# Patient Record
Sex: Female | Born: 1940
Health system: Southern US, Community
[De-identification: ages and names within clinical notes are randomized; demographics above are authoritative.]

## PROBLEM LIST (undated history)

## (undated) DIAGNOSIS — M1990 Unspecified osteoarthritis, unspecified site: Secondary | ICD-10-CM

## (undated) DIAGNOSIS — I8500 Esophageal varices without bleeding: Secondary | ICD-10-CM

## (undated) DIAGNOSIS — K760 Fatty (change of) liver, not elsewhere classified: Secondary | ICD-10-CM

## (undated) DIAGNOSIS — K2970 Gastritis, unspecified, without bleeding: Secondary | ICD-10-CM

## (undated) DIAGNOSIS — K219 Gastro-esophageal reflux disease without esophagitis: Secondary | ICD-10-CM

## (undated) DIAGNOSIS — Z87442 Personal history of urinary calculi: Secondary | ICD-10-CM

## (undated) DIAGNOSIS — I509 Heart failure, unspecified: Secondary | ICD-10-CM

## (undated) DIAGNOSIS — IMO0001 Reserved for inherently not codable concepts without codable children: Secondary | ICD-10-CM

## (undated) DIAGNOSIS — J45909 Unspecified asthma, uncomplicated: Secondary | ICD-10-CM

## (undated) DIAGNOSIS — I10 Essential (primary) hypertension: Secondary | ICD-10-CM

## (undated) DIAGNOSIS — F32A Depression, unspecified: Secondary | ICD-10-CM

## (undated) DIAGNOSIS — R42 Dizziness and giddiness: Secondary | ICD-10-CM

## (undated) DIAGNOSIS — E559 Vitamin D deficiency, unspecified: Secondary | ICD-10-CM

## (undated) DIAGNOSIS — Z8659 Personal history of other mental and behavioral disorders: Secondary | ICD-10-CM

## (undated) DIAGNOSIS — M4800 Spinal stenosis, site unspecified: Secondary | ICD-10-CM

## (undated) DIAGNOSIS — M5410 Radiculopathy, site unspecified: Secondary | ICD-10-CM

## (undated) DIAGNOSIS — J40 Bronchitis, not specified as acute or chronic: Secondary | ICD-10-CM

## (undated) DIAGNOSIS — F329 Major depressive disorder, single episode, unspecified: Secondary | ICD-10-CM

## (undated) DIAGNOSIS — M8580 Other specified disorders of bone density and structure, unspecified site: Secondary | ICD-10-CM

## (undated) DIAGNOSIS — K7460 Unspecified cirrhosis of liver: Secondary | ICD-10-CM

## (undated) DIAGNOSIS — D649 Anemia, unspecified: Secondary | ICD-10-CM

## (undated) DIAGNOSIS — F419 Anxiety disorder, unspecified: Secondary | ICD-10-CM

## (undated) HISTORY — PX: ABDOMINAL HYSTERECTOMY: SHX81

## (undated) HISTORY — DX: Personal history of other mental and behavioral disorders: Z86.59

## (undated) HISTORY — DX: Reserved for inherently not codable concepts without codable children: IMO0001

## (undated) HISTORY — DX: Gastro-esophageal reflux disease without esophagitis: K21.9

## (undated) HISTORY — DX: Radiculopathy, site unspecified: M54.10

## (undated) HISTORY — DX: Fatty (change of) liver, not elsewhere classified: K76.0

## (undated) HISTORY — DX: Other specified disorders of bone density and structure, unspecified site: M85.80

## (undated) HISTORY — DX: Vitamin D deficiency, unspecified: E55.9

## (undated) HISTORY — PX: APPENDECTOMY: SHX54

## (undated) HISTORY — DX: Gastritis, unspecified, without bleeding: K29.70

## (undated) HISTORY — PX: LAPAROSCOPIC CHOLECYSTECTOMY: SUR755

## (undated) HISTORY — PX: COMBINED HYSTERECTOMY ABDOMINAL W/ A&P REPAIR / OOPHORECTOMY: SUR292

## (undated) HISTORY — PX: BREAST BIOPSY: SHX20

## (undated) HISTORY — PX: TONSILLECTOMY: SUR1361

---
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
## 2006-04-05 ENCOUNTER — Ambulatory Visit: Payer: Self-pay | Admitting: Family Medicine

## 2006-04-21 ENCOUNTER — Ambulatory Visit: Payer: Self-pay | Admitting: Family Medicine

## 2007-12-20 ENCOUNTER — Ambulatory Visit: Payer: Self-pay | Admitting: Internal Medicine

## 2011-10-01 ENCOUNTER — Ambulatory Visit: Payer: Self-pay | Admitting: Internal Medicine

## 2011-12-11 DIAGNOSIS — M7060 Trochanteric bursitis, unspecified hip: Secondary | ICD-10-CM | POA: Insufficient documentation

## 2011-12-11 DIAGNOSIS — M5410 Radiculopathy, site unspecified: Secondary | ICD-10-CM | POA: Insufficient documentation

## 2011-12-11 HISTORY — DX: Radiculopathy, site unspecified: M54.10

## 2013-02-21 ENCOUNTER — Ambulatory Visit: Payer: Self-pay | Admitting: Internal Medicine

## 2013-10-05 ENCOUNTER — Encounter (INDEPENDENT_AMBULATORY_CARE_PROVIDER_SITE_OTHER): Payer: Self-pay

## 2013-10-05 ENCOUNTER — Ambulatory Visit (INDEPENDENT_AMBULATORY_CARE_PROVIDER_SITE_OTHER): Payer: Medicare PPO | Admitting: Cardiovascular Disease

## 2013-10-05 ENCOUNTER — Encounter: Payer: Self-pay | Admitting: Cardiovascular Disease

## 2013-10-05 VITALS — BP 138/80 | HR 89 | Ht 63.0 in | Wt 203.2 lb

## 2013-10-05 DIAGNOSIS — R0602 Shortness of breath: Secondary | ICD-10-CM | POA: Insufficient documentation

## 2013-10-05 DIAGNOSIS — F4322 Adjustment disorder with anxiety: Secondary | ICD-10-CM

## 2013-10-05 DIAGNOSIS — E669 Obesity, unspecified: Secondary | ICD-10-CM

## 2013-10-05 DIAGNOSIS — F419 Anxiety disorder, unspecified: Secondary | ICD-10-CM

## 2013-10-05 DIAGNOSIS — F411 Generalized anxiety disorder: Secondary | ICD-10-CM

## 2013-10-05 DIAGNOSIS — R5381 Other malaise: Secondary | ICD-10-CM

## 2013-10-05 DIAGNOSIS — I5032 Chronic diastolic (congestive) heart failure: Secondary | ICD-10-CM | POA: Insufficient documentation

## 2013-10-05 DIAGNOSIS — I509 Heart failure, unspecified: Secondary | ICD-10-CM

## 2013-10-05 HISTORY — DX: Adjustment disorder with anxiety: F43.22

## 2013-10-05 MED ORDER — FUROSEMIDE 20 MG PO TABS: 20.0000 mg | ORAL_TABLET | Freq: Every day | ORAL | Status: DC | PRN

## 2013-10-05 NOTE — Patient Instructions (Signed)
You are doing well.  For significant shortness of breath, Take a lasix /furosemide in the morning with banana  If the shortness of breath gets worse, call the office   Please call us if you have new issues that need to be addressed before your next appt.  Your physician wants you to follow-up in: 6 months. January You will receive a reminder letter in the mail two months in advance. If you don't receive a letter, please call our office to schedule the follow-up appointment.

## 2013-10-05 NOTE — Assessment & Plan Note (Signed)
We have encouraged continued exercise, careful diet management in an effort to lose weight. 

## 2013-10-05 NOTE — Assessment & Plan Note (Signed)
I suspect her shortness of breath is likely multifactorial including deconditioning, obesity, possible mild chronic diastolic CHF. Recommended she start a regular walking program, she will take Lasix only for significant symptoms. Less likely underlying ischemia as she does not have any risk factors, EKG benign, and overall she is relatively well functioning. Recommended she call us the symptoms get worse and additional testing could be performed

## 2013-10-05 NOTE — Assessment & Plan Note (Signed)
She has lost her husband, lost her son as well as several friends. She is doing well considering this, currently lives alone

## 2013-10-05 NOTE — Progress Notes (Signed)
Patient ID: Jenna Franklin, female    DOB: March 17, 1940, 73 y.o.   MRN: [id number]  HPI Comments: [name] is a very pleasant 73 year old woman, patient of Dr. Hall Busing, with history of obesity, anxiety, vertigo, bursitis of her right hip, obesity who presents for evaluation of shortness of breath.  She states that she is worried about cardiac issues as she has a strong family history. She denies any smoking history, the disease She was exercising on a regular basis but stopped last fall 2014 after she developed bronchitis. Since then she has been relatively sedentary and weight has been climbing. She denies any significant chest pain with exertion. She is able to do all of her ADLs. She lives independently Husband died in his 2s after multiple lung cancer surgeries. Son died at 59 from asthma exacerbation.  She reports that her sister recently had  heart valve surgery and she is nervous that she might need the same . Rare episodes of vertigo  She's able to walk around [employer] but after walking to the parking lot, does have to slow down for shortness of breath. She does report having some very mild swelling in her lower extremities. Mother also had this and was on a diuretic  EKG shows normal sinus rhythm with rate 89 beats per minute, rare PVC  Most recent lipid panel at the end of 2014 showing total cholesterol 143, LDL 72, HDL 50   Outpatient Encounter Prescriptions as of 10/05/2013  Medication Sig  . amitriptyline (ELAVIL) 25 MG tablet Take 25 mg by mouth at bedtime.  Marland Kitchen atenolol (TENORMIN) 25 MG tablet Take 25 mg by mouth 2 (two) times daily.   . Calcium Carbonate-Vitamin D (CALCIUM + D PO) Take by mouth daily.  . Estradiol Acetate (FEMRING) 0.05 MG/24HR RING Place vaginally.  . fluticasone (VERAMYST) 27.5 MCG/SPRAY nasal spray Place 2 sprays into the nose daily.  . vitamin E 400 UNIT capsule Take 400 Units by mouth daily.    Review of Systems  Constitutional: Negative.   HENT:  Negative.   Eyes: Negative.   Respiratory: Positive for shortness of breath.   Cardiovascular: Negative.   Gastrointestinal: Negative.   Endocrine: Negative.   Musculoskeletal: Negative.   Skin: Negative.   Allergic/Immunologic: Negative.   Neurological: Negative.   Hematological: Negative.   Psychiatric/Behavioral: Negative.   All other systems reviewed and are negative.   BP 138/80  Pulse 89  Ht 5\' 3"  (1.6 m)  Wt 203 lb 4 oz (92.194 kg)  BMI 36.01 kg/m2  Physical Exam  Nursing note and vitals reviewed. Constitutional: She is oriented to person, place, and time. She appears well-developed and well-nourished.  Obesity  HENT:  Head: Normocephalic.  Nose: Nose normal.  Mouth/Throat: Oropharynx is clear and moist.  Eyes: Conjunctivae are normal. Pupils are equal, round, and reactive to light.  Neck: Normal range of motion. Neck supple. No JVD present.  Cardiovascular: Normal rate, regular rhythm, S1 normal, S2 normal, normal heart sounds and intact distal pulses.  Exam reveals no gallop and no friction rub.   No murmur heard. Pulmonary/Chest: Effort normal and breath sounds normal. No respiratory distress. She has no wheezes. She has no rales. She exhibits no tenderness.  Abdominal: Soft. Bowel sounds are normal. She exhibits no distension. There is no tenderness.  Musculoskeletal: Normal range of motion. She exhibits no edema and no tenderness.  Lymphadenopathy:    She has no cervical adenopathy.  Neurological: She is alert and oriented to person,  place, and time. Coordination normal.  Skin: Skin is warm and dry. No rash noted. No erythema.  Psychiatric: She has a normal mood and affect. Her behavior is normal. Judgment and thought content normal.    Assessment and Plan

## 2013-10-05 NOTE — Assessment & Plan Note (Signed)
Mild lower extremity edema, not particularly impressive but given her shortness of breath and previous echocardiogram several years ago with diastolic dysfunction does raise the concern for diastolic CHF, mild. We have suggested she try Lasix only for significant symptoms of shortness of breath, bloating, worsening leg swelling. She will take this with a banana

## 2013-10-05 NOTE — Assessment & Plan Note (Signed)
Relatively sedentary with recent weight gain. Recommended she increase her regular exercise

## 2014-02-16 DIAGNOSIS — K7460 Unspecified cirrhosis of liver: Secondary | ICD-10-CM

## 2014-02-16 HISTORY — DX: Unspecified cirrhosis of liver: K74.60

## 2014-04-11 DIAGNOSIS — N959 Unspecified menopausal and perimenopausal disorder: Secondary | ICD-10-CM | POA: Insufficient documentation

## 2014-04-17 ENCOUNTER — Ambulatory Visit: Payer: Self-pay | Admitting: Internal Medicine

## 2014-09-20 DIAGNOSIS — R7303 Prediabetes: Secondary | ICD-10-CM | POA: Insufficient documentation

## 2015-02-21 DIAGNOSIS — R609 Edema, unspecified: Secondary | ICD-10-CM | POA: Diagnosis not present

## 2015-02-28 DIAGNOSIS — R600 Localized edema: Secondary | ICD-10-CM | POA: Diagnosis not present

## 2015-02-28 DIAGNOSIS — R69 Illness, unspecified: Secondary | ICD-10-CM | POA: Diagnosis not present

## 2015-02-28 DIAGNOSIS — D509 Iron deficiency anemia, unspecified: Secondary | ICD-10-CM | POA: Diagnosis not present

## 2015-03-11 DIAGNOSIS — D649 Anemia, unspecified: Secondary | ICD-10-CM | POA: Diagnosis not present

## 2015-03-21 DIAGNOSIS — R188 Other ascites: Secondary | ICD-10-CM | POA: Diagnosis not present

## 2015-03-21 DIAGNOSIS — K7689 Other specified diseases of liver: Secondary | ICD-10-CM | POA: Diagnosis not present

## 2015-03-21 DIAGNOSIS — R162 Hepatomegaly with splenomegaly, not elsewhere classified: Secondary | ICD-10-CM | POA: Diagnosis not present

## 2015-03-21 DIAGNOSIS — Z9049 Acquired absence of other specified parts of digestive tract: Secondary | ICD-10-CM | POA: Diagnosis not present

## 2015-03-25 ENCOUNTER — Other Ambulatory Visit: Payer: Self-pay

## 2015-03-25 ENCOUNTER — Ambulatory Visit (INDEPENDENT_AMBULATORY_CARE_PROVIDER_SITE_OTHER): Payer: Medicare HMO | Admitting: Gastroenterology

## 2015-03-25 ENCOUNTER — Encounter: Payer: Self-pay | Admitting: Gastroenterology

## 2015-03-25 VITALS — BP 133/67 | HR 95 | Temp 99.0°F | Ht 63.0 in | Wt 226.0 lb

## 2015-03-25 DIAGNOSIS — R188 Other ascites: Secondary | ICD-10-CM | POA: Diagnosis not present

## 2015-03-25 DIAGNOSIS — F419 Anxiety disorder, unspecified: Secondary | ICD-10-CM | POA: Insufficient documentation

## 2015-03-25 DIAGNOSIS — K7460 Unspecified cirrhosis of liver: Secondary | ICD-10-CM

## 2015-03-25 DIAGNOSIS — N200 Calculus of kidney: Secondary | ICD-10-CM

## 2015-03-25 DIAGNOSIS — R000 Tachycardia, unspecified: Secondary | ICD-10-CM | POA: Insufficient documentation

## 2015-03-25 DIAGNOSIS — K219 Gastro-esophageal reflux disease without esophagitis: Secondary | ICD-10-CM | POA: Insufficient documentation

## 2015-03-25 HISTORY — DX: Calculus of kidney: N20.0

## 2015-03-25 HISTORY — DX: Tachycardia, unspecified: R00.0

## 2015-03-25 MED ORDER — SPIRONOLACTONE 100 MG PO TABS: 100.0000 mg | ORAL_TABLET | Freq: Every day | ORAL | Status: DC

## 2015-03-25 NOTE — Progress Notes (Signed)
Gastroenterology Consultation  Referring Provider:     Albina Billet, MD Primary Care Physician:  Albina Billet, MD Primary Gastroenterologist:  Dr. Allen Norris     Reason for Consultation:     Ascites        HPI:   Jenna Franklin is a 75 y.o. y/o female referred for consultation & management of ascites by Dr. Albina Billet, MD.  This patient comes in today with a report of ascites that has been worse over the last 9 days. The patient states that she was seen by Dr. Tamala Julian at [facility] approximately 8 years ago and was found to have signs consistent with cirrhosis. The patient was told to lose 25 pounds but never went ahead and did it. The patient then reports that she had her gallbladder out about 6 years ago and at that time she was told that there were areas of her liver that looks cirrhotic. The patient had no further follow-up of that. The patient now presents with this progressive abdominal distention and discomfort. The patient has no other complaints such as shortness of breath. The patient denies any alcohol abuse in the past. The patient was diagnosed with nonalcoholic fatty liver disease in the past. The patient also has been found to have anemia and is being treated with iron at the present time. The patient states that her anemia has improved on iron. The patient has not had a colonoscopy in the past. She denies any history of GI problems  Past Medical History  Diagnosis Date  . Osteopenia   . Reflux   . Non-alcoholic fatty liver disease   . Vitamin D deficiency   . H/O: depression   . Osteopenia   . Reflux   . Nonalcoholic fatty liver disease   . Vitamin D deficiency     Past Surgical History  Procedure Laterality Date  . Combined hysterectomy abdominal w/ a&p repair / oophorectomy    . Appendectomy    . Tonsillectomy    . Laparoscopic cholecystectomy    . Breast biopsy      Prior to Admission medications   Medication Sig Start Date End Date Taking?  Authorizing Provider  amitriptyline (ELAVIL) 25 MG tablet Take 25 mg by mouth at bedtime.   Yes Historical Provider, MD  Calcium Carbonate-Vitamin D (CALCIUM + D PO) Take by mouth daily.   Yes Historical Provider, MD  estradiol (ESTRACE) 1 MG tablet Take by mouth. 07/10/14  Yes Historical Provider, MD  ferrous sulfate 325 (65 FE) MG tablet Take 325 mg by mouth daily with breakfast.   Yes Historical Provider, MD  furosemide (LASIX) 20 MG tablet Take 1 tablet (20 mg total) by mouth daily as needed. Patient taking differently: Take 40 mg by mouth daily as needed.  10/05/13  Yes Minna Merritts, MD  meloxicam (MOBIC) 15 MG tablet Take by mouth. 10/09/14  Yes Historical Provider, MD  omeprazole (PRILOSEC) 20 MG capsule Take by mouth.   Yes Historical Provider, MD  vitamin E 400 UNIT capsule Take 400 Units by mouth daily.   Yes Historical Provider, MD  atenolol (TENORMIN) 25 MG tablet Take 25 mg by mouth 2 (two) times daily. Reported on 03/25/2015    Historical Provider, MD  Estradiol Acetate ([place]) 0.05 MG/24HR RING Place vaginally. Reported on 03/25/2015    Historical Provider, MD  fluticasone (VERAMYST) 27.5 MCG/SPRAY nasal spray Place 2 sprays into the nose daily. Reported on 03/25/2015    Historical Provider, MD  spironolactone (ALDACTONE) 100 MG tablet Take 1 tablet (100 mg total) by mouth daily. 03/25/15   Lucilla Lame, MD    Family History  Problem Relation Age of Onset  . Heart disease Mother   . Heart disease Father   . Heart attack Paternal Grandfather   . Asthma Son 40     Social History  Substance Use Topics  . Smoking status: Never Smoker   . Smokeless tobacco: Never Used  . Alcohol Use: No    Allergies as of 03/25/2015 - Review Complete 03/25/2015  Allergen Reaction Noted  . Codeine  09/28/2013    Review of Systems:    All systems reviewed and negative except where noted in HPI.   Physical Exam:  BP 133/67 mmHg  Pulse 95  Temp(Src) 99 F (37.2 C) (Oral)  Ht 5\' 3"  (1.6 m)   Wt 226 lb (102.513 kg)  BMI 40.04 kg/m2 No LMP recorded. Psych:  Alert and cooperative. Normal mood and affect. General:   Alert,  Well-developed, obese, well-nourished, pleasant and cooperative in NAD Head:  Normocephalic and atraumatic. Eyes:  Sclera clear, no icterus.   Conjunctiva pink. Ears:  Normal auditory acuity. Nose:  No deformity, discharge, or lesions. Mouth:  No deformity or lesions,oropharynx pink & moist. Neck:  Supple; no masses or thyromegaly. Lungs:  Respirations even and unlabored.  Clear throughout to auscultation.   No wheezes, crackles, or rhonchi. No acute distress. Heart:  Regular rate and rhythm; no murmurs, clicks, rubs, or gallops. Abdomen:  Normal bowel sounds.  No bruits.  Soft, non-tender and non-distended without masses, hepatosplenomegaly cannot be determined with the patient's ascites being present  No guarding or rebound tenderness.  Negative Carnett sign.   Rectal:  Deferred.  Msk:  Symmetrical without gross deformities.  Good, equal movement & strength bilaterally. Pulses:  Normal pulses noted. Extremities:  No clubbing.  No cyanosis. There was edema on both lower extremities. Neurologic:  Alert and oriented x3;  grossly normal neurologically. Skin:  Intact without significant lesions or rashes.  No jaundice. Lymph Nodes:  No significant cervical adenopathy. Psych:  Alert and cooperative. Normal mood and affect.  Imaging Studies: No results found.  Assessment and Plan:   Jenna Franklin is a 75 y.o. y/o female who comes in today with a history of nonalcoholic fatty liver disease with cirrhosis. The patient now is here with worsening ascites. The patient was told to increase her Lasix from 20 to 40 by her primary care provider. The patient will also be started on Aldactone. The patient will started 100 mg a day. She will also have her labs checked today for both renal function and potassium. She will come back in one week and have it checked again  and depending on this will determine whether we have room to increase her diuretics. The patient has been explained the pathology and pathophysiology of her cirrhosis. She'll also be set up for a Doppler ultrasound to make sure that portal vein thrombosis is not the reason for the patient's recent decompensation.   Note: This dictation was prepared with Dragon dictation along with smaller phrase technology. Any transcriptional errors that result from this process are unintentional.

## 2015-03-26 LAB — COMPREHENSIVE METABOLIC PANEL
A/G RATIO: 1 — AB (ref 1.1–2.5)
ALBUMIN: 3.4 g/dL — AB (ref 3.5–4.8)
ALT: 18 IU/L (ref 0–32)
AST: 36 IU/L (ref 0–40)
Alkaline Phosphatase: 81 IU/L (ref 39–117)
BILIRUBIN TOTAL: 1.3 mg/dL — AB (ref 0.0–1.2)
BUN / CREAT RATIO: 11 (ref 11–26)
BUN: 9 mg/dL (ref 8–27)
CHLORIDE: 98 mmol/L (ref 96–106)
CO2: 23 mmol/L (ref 18–29)
Calcium: 8.2 mg/dL — ABNORMAL LOW (ref 8.7–10.3)
Creatinine, Ser: 0.8 mg/dL (ref 0.57–1.00)
GFR calc non Af Amer: 73 mL/min/{1.73_m2} (ref >59)
GFR, EST AFRICAN AMERICAN: 84 mL/min/{1.73_m2} (ref >59)
Globulin, Total: 3.4 g/dL (ref 1.5–4.5)
Glucose: 115 mg/dL — ABNORMAL HIGH (ref 65–99)
POTASSIUM: 3.4 mmol/L — AB (ref 3.5–5.2)
Sodium: 138 mmol/L (ref 134–144)
TOTAL PROTEIN: 6.8 g/dL (ref 6.0–8.5)

## 2015-03-29 ENCOUNTER — Other Ambulatory Visit: Payer: Self-pay | Admitting: Gastroenterology

## 2015-03-29 ENCOUNTER — Other Ambulatory Visit: Payer: Self-pay

## 2015-03-29 DIAGNOSIS — R188 Other ascites: Principal | ICD-10-CM

## 2015-03-29 DIAGNOSIS — K7460 Unspecified cirrhosis of liver: Secondary | ICD-10-CM

## 2015-04-01 ENCOUNTER — Ambulatory Visit: Admission: RE | Admit: 2015-04-01 | Payer: Medicare PPO | Source: Ambulatory Visit

## 2015-04-01 ENCOUNTER — Ambulatory Visit
Admission: RE | Admit: 2015-04-01 | Discharge: 2015-04-01 | Disposition: A | Discharge status: Home / Self Care | Payer: Medicare HMO | Source: Ambulatory Visit | Attending: Gastroenterology | Admitting: Gastroenterology

## 2015-04-01 DIAGNOSIS — R161 Splenomegaly, not elsewhere classified: Secondary | ICD-10-CM | POA: Insufficient documentation

## 2015-04-01 DIAGNOSIS — R188 Other ascites: Secondary | ICD-10-CM | POA: Diagnosis not present

## 2015-04-01 DIAGNOSIS — K7689 Other specified diseases of liver: Secondary | ICD-10-CM | POA: Diagnosis not present

## 2015-04-01 DIAGNOSIS — K7460 Unspecified cirrhosis of liver: Secondary | ICD-10-CM | POA: Insufficient documentation

## 2015-04-01 DIAGNOSIS — R140 Abdominal distension (gaseous): Secondary | ICD-10-CM | POA: Insufficient documentation

## 2015-04-02 ENCOUNTER — Other Ambulatory Visit: Payer: Self-pay

## 2015-04-02 ENCOUNTER — Telehealth: Payer: Self-pay

## 2015-04-02 DIAGNOSIS — K7460 Unspecified cirrhosis of liver: Secondary | ICD-10-CM

## 2015-04-02 DIAGNOSIS — R188 Other ascites: Principal | ICD-10-CM

## 2015-04-02 NOTE — Telephone Encounter (Signed)
-----   Message from Lucilla Lame, MD sent at 04/01/2015  4:10 PM EST ----- The patient had a nodule in the left liver and will need an MRI with and without contrast. Please also send off an alpha-fetoprotein

## 2015-04-03 NOTE — Telephone Encounter (Signed)
Pt has been notified of imaging results. Pt has been scheduled for the MRI w/wo contrast at [facility] on 04/19/15 at 1:00pm. This was scheduled with her son Merry Proud. Instructions were given and understood. Pt will also come Wed or Thurs for labs.

## 2015-04-04 DIAGNOSIS — K7460 Unspecified cirrhosis of liver: Secondary | ICD-10-CM | POA: Diagnosis not present

## 2015-04-05 LAB — COMPREHENSIVE METABOLIC PANEL
A/G RATIO: 1 — AB (ref 1.1–2.5)
ALBUMIN: 3.4 g/dL — AB (ref 3.5–4.8)
ALK PHOS: 70 IU/L (ref 39–117)
ALT: 18 IU/L (ref 0–32)
AST: 37 IU/L (ref 0–40)
BILIRUBIN TOTAL: 1.5 mg/dL — AB (ref 0.0–1.2)
BUN / CREAT RATIO: 11 (ref 11–26)
BUN: 10 mg/dL (ref 8–27)
CHLORIDE: 96 mmol/L (ref 96–106)
CO2: 21 mmol/L (ref 18–29)
Calcium: 9 mg/dL (ref 8.7–10.3)
Creatinine, Ser: 0.94 mg/dL (ref 0.57–1.00)
GFR calc non Af Amer: 60 mL/min/{1.73_m2} (ref >59)
GFR, EST AFRICAN AMERICAN: 69 mL/min/{1.73_m2} (ref >59)
GLUCOSE: 159 mg/dL — AB (ref 65–99)
Globulin, Total: 3.3 g/dL (ref 1.5–4.5)
POTASSIUM: 4.4 mmol/L (ref 3.5–5.2)
Sodium: 133 mmol/L — ABNORMAL LOW (ref 134–144)
TOTAL PROTEIN: 6.7 g/dL (ref 6.0–8.5)

## 2015-04-05 LAB — AFP TUMOR MARKER: AFP TUMOR MARKER: 6.1 ng/mL (ref 0.0–8.3)

## 2015-04-08 ENCOUNTER — Other Ambulatory Visit: Payer: Self-pay

## 2015-04-08 ENCOUNTER — Telehealth: Payer: Self-pay

## 2015-04-08 DIAGNOSIS — R188 Other ascites: Principal | ICD-10-CM

## 2015-04-08 DIAGNOSIS — K7460 Unspecified cirrhosis of liver: Secondary | ICD-10-CM

## 2015-04-08 MED ORDER — SPIRONOLACTONE 50 MG PO TABS: 50.0000 mg | ORAL_TABLET | Freq: Every day | ORAL | Status: DC

## 2015-04-08 NOTE — Telephone Encounter (Signed)
Pt has been advised her recent lab results. Per Dr. Allen Norris, pt is to continue Lasix 40mg  and increase her Spironolactone to 150mg  daily. Pt verbalized understanding and will call me when she needs a refill.

## 2015-04-09 ENCOUNTER — Telehealth: Payer: Self-pay

## 2015-04-09 NOTE — Telephone Encounter (Signed)
-----   Message from Lucilla Lame, MD sent at 04/09/2015  1:16 PM EST ----- Will increase this prior lactone to help with her ascites

## 2015-04-09 NOTE — Telephone Encounter (Signed)
Pt notified of results and to increase Spironolactone to 150mg .

## 2015-04-19 ENCOUNTER — Ambulatory Visit
Admission: RE | Admit: 2015-04-19 | Discharge: 2015-04-19 | Disposition: A | Discharge status: Home / Self Care | Payer: Medicare HMO | Source: Ambulatory Visit | Attending: Gastroenterology | Admitting: Gastroenterology

## 2015-04-19 DIAGNOSIS — K769 Liver disease, unspecified: Secondary | ICD-10-CM | POA: Insufficient documentation

## 2015-04-19 DIAGNOSIS — R188 Other ascites: Secondary | ICD-10-CM | POA: Insufficient documentation

## 2015-04-19 DIAGNOSIS — K7460 Unspecified cirrhosis of liver: Secondary | ICD-10-CM | POA: Diagnosis not present

## 2015-04-19 DIAGNOSIS — R161 Splenomegaly, not elsewhere classified: Secondary | ICD-10-CM | POA: Insufficient documentation

## 2015-04-19 MED ORDER — GADOBENATE DIMEGLUMINE 529 MG/ML IV SOLN
20.0000 mL | Freq: Once | INTRAVENOUS | Status: AC | PRN
  Administered 2015-04-19: 20 mL via INTRAVENOUS

## 2015-04-24 ENCOUNTER — Telehealth: Payer: Self-pay

## 2015-04-24 NOTE — Telephone Encounter (Signed)
When will we need to repeat her LFT's and AFP?

## 2015-04-24 NOTE — Telephone Encounter (Signed)
-----   Message from Lucilla Lame, MD sent at 04/23/2015  2:10 PM EST ----- Let the patient know that her imaging showed her to have small to moderate amount of ascites. It also was consistent with her cirrhosis.

## 2015-04-24 NOTE — Telephone Encounter (Signed)
Pt notified of results

## 2015-04-26 NOTE — Telephone Encounter (Signed)
1 month

## 2015-04-29 NOTE — Telephone Encounter (Signed)
Pt notified when to repeat labs.

## 2015-05-07 ENCOUNTER — Telehealth: Payer: Self-pay | Admitting: Gastroenterology

## 2015-05-07 NOTE — Telephone Encounter (Signed)
Patient is shaking and can't write, can't do much of anything. Was given Lasix 2 tablet daily and spironolactone, it was upped from 1 tablet to 1 1/2, 150 mg daily. She doesn't know if this medication is doing this. Her friend is taking her to the grocery store so she would like for you to call in first thing in the morning.

## 2015-05-07 NOTE — Telephone Encounter (Signed)
Advised pt per Dr. Allen Norris to stop her Lasix and Spironolactone for a few days. If shaking continues, then she should contact her PCP as it may not be GI related and to then restart these 2 medications. Pt verbalized understanding and will contact me in a few days to let me know if shaking stops or continues.

## 2015-05-27 ENCOUNTER — Telehealth: Payer: Self-pay | Admitting: Gastroenterology

## 2015-05-27 DIAGNOSIS — M7989 Other specified soft tissue disorders: Secondary | ICD-10-CM

## 2015-05-27 NOTE — Telephone Encounter (Signed)
Patient has appointment Thursday. Should she start back on the medication, Spironolactone? The shacking did stop but the fluid is beginning to build back up. She's been off of it for 2 weeks and has 4 pills left. She needs some relief. Dr. Dorothey Baseman patient.

## 2015-05-27 NOTE — Telephone Encounter (Signed)
Returned phone call to patient. She states that she has not been shaking since discontinuing the Spironolactone. However, the fluid is building back up per patient and she is having some SOB on exertion. Spoke with Dr. Allen Norris regarding this. He would like for patient to increase Lasix 20 additional mg daily. Patient states that she is currently taking 40 mg in am. I asked her to take an additional 20 mg in the evening to help remove extra fluid. He also would like for patient to get a Creatinine in 1 week. Lab has been ordered and patient will pick up at upcoming appointment. She verbalizes understanding and will come to her appointment on Thursday as scheduled.   Patient is encouraged to go straight to the Emergency Room if she begins having difficulty breathing or chest pain.

## 2015-05-29 ENCOUNTER — Encounter: Payer: Self-pay | Admitting: Internal Medicine

## 2015-05-29 ENCOUNTER — Ambulatory Visit (INDEPENDENT_AMBULATORY_CARE_PROVIDER_SITE_OTHER): Payer: Medicare HMO | Admitting: Internal Medicine

## 2015-05-29 ENCOUNTER — Ambulatory Visit
Admission: RE | Admit: 2015-05-29 | Discharge: 2015-05-29 | Disposition: A | Discharge status: Home / Self Care | Payer: Medicare HMO | Source: Ambulatory Visit | Attending: Internal Medicine | Admitting: Internal Medicine

## 2015-05-29 VITALS — BP 126/68 | HR 88 | Temp 98.1°F | Resp 20 | Wt 225.0 lb

## 2015-05-29 DIAGNOSIS — R0602 Shortness of breath: Secondary | ICD-10-CM | POA: Diagnosis not present

## 2015-05-29 DIAGNOSIS — Z1239 Encounter for other screening for malignant neoplasm of breast: Secondary | ICD-10-CM

## 2015-05-29 DIAGNOSIS — J40 Bronchitis, not specified as acute or chronic: Secondary | ICD-10-CM | POA: Insufficient documentation

## 2015-05-29 DIAGNOSIS — R188 Other ascites: Secondary | ICD-10-CM

## 2015-05-29 DIAGNOSIS — N952 Postmenopausal atrophic vaginitis: Secondary | ICD-10-CM

## 2015-05-29 DIAGNOSIS — J90 Pleural effusion, not elsewhere classified: Secondary | ICD-10-CM | POA: Diagnosis not present

## 2015-05-29 DIAGNOSIS — K7460 Unspecified cirrhosis of liver: Secondary | ICD-10-CM | POA: Diagnosis not present

## 2015-05-29 DIAGNOSIS — K760 Fatty (change of) liver, not elsewhere classified: Secondary | ICD-10-CM | POA: Insufficient documentation

## 2015-05-29 MED ORDER — AMOXICILLIN 875 MG PO TABS: 875.0000 mg | ORAL_TABLET | Freq: Two times a day (BID) | ORAL | Status: DC

## 2015-05-29 NOTE — Progress Notes (Signed)
Date:  05/29/2015   Name:  Jenna Franklin   DOB:  01-Oct-1940   MRN:  [id number]  New patient here to get established. Previously seen by Dr. Hall Busing and then most recently by Dr. Ellison Hughs at [facility]. Her history is positive for intermittent vertigo, atrophic vaginitis, acid reflux, anemia, anxiety and impaired glucose tolerance.  She also has a diagnosis of Chronic diastolic CHF but  ECHO in January: Normal LV syst fxn with mild LVH, EF >55%, no valvular stenosis.   Chief Complaint: Establish Care and Cough Cough This is a new problem. The current episode started in the past 7 days. The problem has been gradually worsening. The problem occurs hourly. The cough is productive of purulent sputum. Associated symptoms include shortness of breath and wheezing. Pertinent negatives include no chest pain, chills, fever, headaches or rash. Her past medical history is significant for environmental allergies. There is no history of asthma (but she does have intermittent wheezing).   Cirrhosis with ascites - recently seen by Dr. Allen Norris.  She did not tolerate spironolactone due to shaking.  Now on Lasix 20 mg three per day.  Initially she improved with good diuresis on spironolactone but now starting to retain more.  She can not sleep reclined due to vertigo.  She denies fever or chills, abdominal pain, jaundice, diarrhea or vomiting.  The cause of her liver disease appears to be NAFLD.  She is not aware of being tested for Hep C.  Several family members on her mother's side also have non-alcoholic liver disease.  Chronic sinusitis - followed by Dr. Kathyrn Sheriff.  She uses Veramyst daily and albuterol as needed.  She has a history of chronic infections - she is not sure that last time she was treated.  However, Dr. Kathyrn Sheriff is considering surgery.  Review of Systems  Constitutional: Positive for fatigue. Negative for fever and chills.  HENT: Positive for congestion. Negative for tinnitus.   Eyes: Negative for visual  disturbance.  Respiratory: Positive for cough, shortness of breath and wheezing. Negative for choking and chest tightness.   Cardiovascular: Positive for leg swelling. Negative for chest pain and palpitations.  Gastrointestinal: Positive for abdominal distention. Negative for diarrhea, constipation and blood in stool.  Genitourinary: Negative for dysuria.  Skin: Negative for color change and rash.  Allergic/Immunologic: Positive for environmental allergies.  Neurological: Positive for dizziness, tremors and weakness. Negative for syncope, numbness and headaches.  Psychiatric/Behavioral: Positive for dysphoric mood.    Patient Active Problem List   Diagnosis Date Noted  . Cirrhosis of liver with ascites ([place]) 05/29/2015  . Non-alcoholic fatty liver disease 05/29/2015  . Anxiety 03/25/2015  . Acid reflux 03/25/2015  . Calculus of kidney 03/25/2015  . Fast heart beat 03/25/2015  . Impaired glucose tolerance 09/20/2014  . Menopausal problem 04/11/2014  . Adjustment disorder with anxious mood 10/05/2013  . Physical deconditioning 10/05/2013  . Obesity 10/05/2013  . Chronic diastolic CHF (congestive heart failure) ([place]) 10/05/2013  . SOB (shortness of breath) 10/05/2013  . Nerve root pain 12/11/2011  . Bursitis, trochanteric 12/11/2011    Prior to Admission medications   Medication Sig Start Date End Date Taking? Authorizing Provider  amitriptyline (ELAVIL) 25 MG tablet Take 25 mg by mouth at bedtime.   Yes Historical Provider, MD  atenolol (TENORMIN) 25 MG tablet Take 25 mg by mouth 2 (two) times daily. Reported on 03/25/2015   Yes Historical Provider, MD  Calcium Carbonate-Vitamin D (CALCIUM + D PO) Take by mouth daily.  Yes Historical Provider, MD  estradiol (ESTRACE) 1 MG tablet Take by mouth. 07/10/14  Yes Historical Provider, MD  ferrous sulfate 325 (65 FE) MG tablet Take 325 mg by mouth daily with breakfast.   Yes Historical Provider, MD  fluticasone (VERAMYST) 27.5 MCG/SPRAY  nasal spray Place 2 sprays into the nose daily. Reported on 03/25/2015   Yes Historical Provider, MD  furosemide (LASIX) 20 MG tablet Take 1 tablet (20 mg total) by mouth daily as needed. Patient taking differently: Take 40 mg by mouth daily as needed.  10/05/13  Yes Minna Merritts, MD  omeprazole (PRILOSEC) 20 MG capsule Take by mouth.   Yes Historical Provider, MD  spironolactone (ALDACTONE) 100 MG tablet Take 1 tablet (100 mg total) by mouth daily. 03/25/15  Yes Lucilla Lame, MD  vitamin E 400 UNIT capsule Take 400 Units by mouth daily.   Yes Historical Provider, MD  Estradiol Acetate ([place]) 0.05 MG/24HR RING Place vaginally. Reported on 05/29/2015    Historical Provider, MD  meloxicam (MOBIC) 15 MG tablet Take by mouth. Reported on 05/29/2015 10/09/14   Historical Provider, MD  spironolactone (ALDACTONE) 50 MG tablet Take 1 tablet (50 mg total) by mouth daily. Pt taking Spironactone 150mg  daily total. Patient not taking: Reported on 05/29/2015 04/08/15   Lucilla Lame, MD    Allergies  Allergen Reactions  . Codeine     Past Surgical History  Procedure Laterality Date  . Combined hysterectomy abdominal w/ a&p repair / oophorectomy    . Appendectomy    . Tonsillectomy    . Laparoscopic cholecystectomy    . Breast biopsy      Social History  Substance Use Topics  . Smoking status: Never Smoker   . Smokeless tobacco: Never Used  . Alcohol Use: No     Medication list has been reviewed and updated.   Physical Exam  Constitutional: She is oriented to person, place, and time. She appears well-developed and well-nourished. She appears ill.  Neck: Normal range of motion. Carotid bruit is not present. No thyromegaly present.  Cardiovascular: Normal rate, regular rhythm and normal heart sounds.   No extrasystoles are present. Exam reveals no gallop.   Pulmonary/Chest: Effort normal. No accessory muscle usage. She has decreased breath sounds in the right middle field and the right lower  field.  Abdominal: Soft. She exhibits distension. She exhibits no mass. Bowel sounds are decreased. There is no tenderness.  Musculoskeletal: She exhibits edema (both lower legs to the mid tibia).  Neurological: She is alert and oriented to person, place, and time.  Skin: Skin is warm, dry and intact.  Psychiatric: Her speech is normal and behavior is normal. Her mood appears anxious.    BP 126/68 mmHg  Pulse 88  Temp(Src) 98.1 F (36.7 C)  Resp 20  Wt 225 lb (102.059 kg)  SpO2 94%  Assessment and Plan:  1. Post-menopausal atrophic vaginitis Followed by [facility] - improved on oral estrogen  2. Bronchitis Recommend spacer for MDI as technique is sub-optimal - amoxicillin (AMOXIL) 875 MG tablet; Take 1 tablet (875 mg total) by mouth 2 (two) times daily.  Dispense: 20 tablet; Refill: 0 - DG Chest 2 View; Future  3. SOB (shortness of breath) Suspect pleural effusion - DG Chest 2 View; Future  4. Cirrhosis of liver with ascites, unspecified hepatic cirrhosis type (HCC) Increase lasix to 40 mg bid due to persistent fluid retention Follow up with Dr. Allen Norris tomorrow as planned - I stressed to her fluid  and sodium restriction as well the chronic nature of cirrhosis of the liver There may be an hereditary component and I don't think she has ever been tested for Hep C.  5. Breast cancer screening - MM DIGITAL SCREENING BILATERAL; Future   Halina Maidens, MD [facility]  05/29/2015

## 2015-05-29 NOTE — Patient Instructions (Signed)
Increase furosemide to 20 mg   2 tablets twice a day Get a Spacer for your inhaler  Breast Self-Awareness Practicing breast self-awareness may pick up problems early, prevent significant medical complications, and possibly save your life. By practicing breast self-awareness, you can become familiar with how your breasts look and feel and if your breasts are changing. This allows you to notice changes early. It can also offer you some reassurance that your breast health is good. One way to learn what is normal for your breasts and whether your breasts are changing is to do a breast self-exam. If you find a lump or something that was not present in the past, it is best to contact your caregiver right away. Other findings that should be evaluated by your caregiver include nipple discharge, especially if it is bloody; skin changes or reddening; areas where the skin seems to be pulled in (retracted); or new lumps and bumps. Breast pain is seldom associated with cancer (malignancy), but should also be evaluated by a caregiver. HOW TO PERFORM A BREAST SELF-EXAM The best time to examine your breasts is 5-7 days after your menstrual period is over. During menstruation, the breasts are lumpier, and it may be more difficult to pick up changes. If you do not menstruate, have reached menopause, or had your uterus removed (hysterectomy), you should examine your breasts at regular intervals, such as monthly. If you are breastfeeding, examine your breasts after a feeding or after using a breast pump. Breast implants do not decrease the risk for lumps or tumors, so continue to perform breast self-exams as recommended. Talk to your caregiver about how to determine the difference between the implant and breast tissue. Also, talk about the amount of pressure you should use during the exam. Over time, you will become more familiar with the variations of your breasts and more comfortable with the exam. A breast self-exam requires  you to remove all your clothes above the waist. 1. Look at your breasts and nipples. Stand in front of a mirror in a room with good lighting. With your hands on your hips, push your hands firmly downward. Look for a difference in shape, contour, and size from one breast to the other (asymmetry). Asymmetry includes puckers, dips, or bumps. Also, look for skin changes, such as reddened or scaly areas on the breasts. Look for nipple changes, such as discharge, dimpling, repositioning, or redness. 2. Carefully feel your breasts. This is best done either in the shower or tub while using soapy water or when flat on your back. Place the arm (on the side of the breast you are examining) above your head. Use the pads (not the fingertips) of your three middle fingers on your opposite hand to feel your breasts. Start in the underarm area and use  inch (2 cm) overlapping circles to feel your breast. Use 3 different levels of pressure (light, medium, and firm pressure) at each circle before moving to the next circle. The light pressure is needed to feel the tissue closest to the skin. The medium pressure will help to feel breast tissue a little deeper, while the firm pressure is needed to feel the tissue close to the ribs. Continue the overlapping circles, moving downward over the breast until you feel your ribs below your breast. Then, move one finger-width towards the center of the body. Continue to use the  inch (2 cm) overlapping circles to feel your breast as you move slowly up toward the collar bone (clavicle) near  the base of the neck. Continue the up and down exam using all 3 pressures until you reach the middle of the chest. Do this with each breast, carefully feeling for lumps or changes. 3.  Keep a written record with breast changes or normal findings for each breast. By writing this information down, you do not need to depend only on memory for size, tenderness, or location. Write down where you are in your  menstrual cycle, if you are still menstruating. Breast tissue can have some lumps or thick tissue. However, see your caregiver if you find anything that concerns you.  SEEK MEDICAL CARE IF:  You see a change in shape, contour, or size of your breasts or nipples.   You see skin changes, such as reddened or scaly areas on the breasts or nipples.   You have an unusual discharge from your nipples.   You feel a new lump or unusually thick areas.    This information is not intended to replace advice given to you by your health care provider. Make sure you discuss any questions you have with your health care provider.   Document Released: 02/02/2005 Document Revised: 01/20/2012 Document Reviewed: 05/20/2011 Elsevier Interactive Patient Education [employer].

## 2015-05-30 ENCOUNTER — Other Ambulatory Visit: Payer: Self-pay

## 2015-05-30 ENCOUNTER — Ambulatory Visit (INDEPENDENT_AMBULATORY_CARE_PROVIDER_SITE_OTHER): Payer: Medicare HMO | Admitting: Gastroenterology

## 2015-05-30 ENCOUNTER — Telehealth: Payer: Self-pay

## 2015-05-30 ENCOUNTER — Encounter: Payer: Self-pay | Admitting: Gastroenterology

## 2015-05-30 ENCOUNTER — Other Ambulatory Visit: Payer: Self-pay | Admitting: Internal Medicine

## 2015-05-30 VITALS — BP 132/58 | HR 86 | Temp 98.0°F | Ht 63.0 in | Wt 188.0 lb

## 2015-05-30 DIAGNOSIS — R188 Other ascites: Principal | ICD-10-CM

## 2015-05-30 DIAGNOSIS — K7460 Unspecified cirrhosis of liver: Secondary | ICD-10-CM

## 2015-05-30 DIAGNOSIS — M7989 Other specified soft tissue disorders: Secondary | ICD-10-CM | POA: Diagnosis not present

## 2015-05-30 DIAGNOSIS — K760 Fatty (change of) liver, not elsewhere classified: Secondary | ICD-10-CM

## 2015-05-30 MED ORDER — SPIRONOLACTONE 100 MG PO TABS: 100.0000 mg | ORAL_TABLET | Freq: Two times a day (BID) | ORAL | Status: DC

## 2015-05-30 NOTE — Progress Notes (Addendum)
Primary Care Physician: Halina Maidens, MD  Primary Gastroenterologist:  Dr. Lucilla Lame  Chief Complaint  Patient presents with  . Follow up cirrhosis    HPI: Jenna Franklin is a 75 y.o. female here for follow-up of her cirrhosis. The patient was on Aldactone then reports that she started having some shaking and weakness. She also has lost a significant amount of weight with trying to watch her diet. He knows stop the Lasix but states that it was not working as well as when she was taking the Aldactone and Lasix together. She states that she felt wonderful when she was taking both but now has lower extremity swelling and abdominal swelling off of the Aldactone. She had a chest x-ray that showed a pleural effusion versus an infiltration. The patient was started on antibiotics.  Current Outpatient Prescriptions  Medication Sig Dispense Refill  . amitriptyline (ELAVIL) 25 MG tablet Take 25 mg by mouth at bedtime.    Marland Kitchen amoxicillin (AMOXIL) 875 MG tablet Take 1 tablet (875 mg total) by mouth 2 (two) times daily. 20 tablet 0  . atenolol (TENORMIN) 25 MG tablet Take 25 mg by mouth 2 (two) times daily. Reported on 03/25/2015    . Calcium Carbonate-Vitamin D (CALCIUM + D PO) Take by mouth daily.    Marland Kitchen estradiol (ESTRACE) 1 MG tablet Take by mouth.    . ferrous sulfate 325 (65 FE) MG tablet Take 325 mg by mouth daily with breakfast.    . fluticasone (VERAMYST) 27.5 MCG/SPRAY nasal spray Place 2 sprays into the nose daily. Reported on 03/25/2015    . furosemide (LASIX) 20 MG tablet Take 1 tablet (20 mg total) by mouth daily as needed. (Patient taking differently: Take 40 mg by mouth daily as needed. ) 30 tablet 3  . omeprazole (PRILOSEC) 20 MG capsule Take by mouth.    . vitamin E 400 UNIT capsule Take 400 Units by mouth daily.    Marland Kitchen spironolactone (ALDACTONE) 100 MG tablet Take 1 tablet (100 mg total) by mouth 2 (two) times daily. 60 tablet 4   No current facility-administered medications for this  visit.    Allergies as of 05/30/2015 - Review Complete 05/30/2015  Allergen Reaction Noted  . Codeine  09/28/2013    ROS:  General: Negative for anorexia, weight loss, fever, chills, fatigue, weakness. ENT: Negative for hoarseness, difficulty swallowing , nasal congestion. CV: Negative for chest pain, angina, palpitations, dyspnea on exertion, peripheral edema.  Respiratory: Negative for dyspnea at rest, dyspnea on exertion, cough, sputum, wheezing.  GI: See history of present illness. GU:  Negative for dysuria, hematuria, urinary incontinence, urinary frequency, nocturnal urination.  Endo: Negative for unusual weight change.    Physical Examination:   BP 132/58 mmHg  Pulse 86  Temp(Src) 98 F (36.7 C) (Oral)  Ht 5\' 3"  (1.6 m)  Wt 188 lb (85.276 kg)  BMI 33.31 kg/m2  General: Well-nourished, well-developed in no acute distress.  Eyes: No icterus. Conjunctivae pink. Mouth: Oropharyngeal mucosa moist and pink , no lesions erythema or exudate. Lungs: Clear to auscultation bilaterally. Non-labored. Heart: Regular rate and rhythm, no murmurs rubs or gallops.  Abdomen: Bowel sounds are normal, nontender, nondistended, no hepatosplenomegaly or masses, no abdominal bruits or hernia , no rebound or guarding.   Extremities: Positive lower extremity edema. No clubbing or deformities. Neuro: Alert and oriented x 3.  Grossly intact. Skin: Warm and dry, no jaundice.   Psych: Alert and cooperative, normal mood and affect.  Labs:  Imaging Studies: Dg Chest 2 View  05/29/2015  CLINICAL DATA:  Shortness of breath and bronchitic symptoms for the past several weeks; patient has new diagnosis of cirrhosis of the liver possible CHF; patient reports dizziness and unsteady gait. EXAM: CHEST  2 VIEW COMPARISON:  None in PACs FINDINGS: There is a moderate-sized right pleural effusion. There is mild prominence of the right suprahilar soft tissues however. The left lung is clear. The heart is not  enlarged. The pulmonary vascularity is not engorged. The observed bony thorax is unremarkable. IMPRESSION: Moderate sized right pleural effusion. Probable partially obscured right perihilar atelectasis, infiltrate, or mass. Chest CT scanning would be a useful next imaging step. Electronically Signed   By: David  Martinique M.D.   On: 05/29/2015 17:02    Assessment and Plan:   Jenna Franklin is a 75 y.o. y/o female comes in today with nonalcoholic fatty liver disease with cirrhosis. The patient has diverticulitis. The patient will be set up for an upper endoscopy. The patient will have this done to look for esophageal varices. She also states that she would like to start back on the Aldactone could she felt much better on the Aldactone. The patient also has been told to stop her amitriptyline that she takes before she goes to sleep because this may be causing her to have her tremors. The patient has been explained the plan and agrees with it.   Note: This dictation was prepared with Dragon dictation along with smaller phrase technology. Any transcriptional errors that result from this process are unintentional.

## 2015-05-30 NOTE — Telephone Encounter (Signed)
Jenna Franklin, daughter, called and states that we have mentioned to the patient that she can get second opinion in regards to her cirrhosis of the liver prognosis and treatment if patient wanted to. Daughter states they want to proceed with this. She provided names of the specialists she researched and they are all at [facility]. Her 2 top preferences are Dr. Junious Silk, Dr. Deatra James (her phone number is [phone]), and daughter states if she can not get in with those any time soon the third name is Dr. Laurier Nancy. Daughter states they are all hepatologist. I advised daughter that the doctor is out of the office until Monday April 17th and it will be addressed at that time. Daughters call backs are listed in the incoming information up top-aa

## 2015-05-31 LAB — COMPREHENSIVE METABOLIC PANEL
ALBUMIN: 3.5 g/dL (ref 3.5–4.8)
ALT: 28 IU/L (ref 0–32)
AST: 50 IU/L — ABNORMAL HIGH (ref 0–40)
Albumin/Globulin Ratio: 0.9 — ABNORMAL LOW (ref 1.2–2.2)
Alkaline Phosphatase: 94 IU/L (ref 39–117)
BILIRUBIN TOTAL: 1.1 mg/dL (ref 0.0–1.2)
BUN / CREAT RATIO: 14 (ref 12–28)
BUN: 11 mg/dL (ref 8–27)
CO2: 23 mmol/L (ref 18–29)
CREATININE: 0.76 mg/dL (ref 0.57–1.00)
Calcium: 8.5 mg/dL — ABNORMAL LOW (ref 8.7–10.3)
Chloride: 98 mmol/L (ref 96–106)
GFR calc non Af Amer: 77 mL/min/{1.73_m2} (ref >59)
GFR, EST AFRICAN AMERICAN: 89 mL/min/{1.73_m2} (ref >59)
GLUCOSE: 109 mg/dL — AB (ref 65–99)
Globulin, Total: 3.7 g/dL (ref 1.5–4.5)
Potassium: 3.8 mmol/L (ref 3.5–5.2)
Sodium: 138 mmol/L (ref 134–144)
TOTAL PROTEIN: 7.2 g/dL (ref 6.0–8.5)

## 2015-05-31 LAB — HEPATIC FUNCTION PANEL: Bilirubin, Direct: 0.42 mg/dL — ABNORMAL HIGH (ref 0.00–0.40)

## 2015-05-31 LAB — PROTIME-INR
INR: 1.2 (ref 0.8–1.2)
Prothrombin Time: 11.9 s (ref 9.1–12.0)

## 2015-05-31 LAB — AFP TUMOR MARKER: AFP TUMOR MARKER: 7 ng/mL (ref 0.0–8.3)

## 2015-06-03 ENCOUNTER — Telehealth: Payer: Self-pay | Admitting: Gastroenterology

## 2015-06-03 ENCOUNTER — Other Ambulatory Visit: Payer: Self-pay

## 2015-06-03 NOTE — Telephone Encounter (Signed)
-----   Message from Lucilla Lame, MD sent at 06/03/2015 11:40 AM EDT ----- Let the patient know that the tumor marker test was negative.

## 2015-06-03 NOTE — Telephone Encounter (Signed)
Patient called to cancel her endoscopy on Thursday. She has bronchitis and is very weak. She will call you back to reschedule

## 2015-06-03 NOTE — Telephone Encounter (Signed)
[place] notified pt cancelled EGD.

## 2015-06-03 NOTE — Telephone Encounter (Signed)
Pt notified of lab results and has rescheduled EGD to May 1st.

## 2015-06-06 ENCOUNTER — Ambulatory Visit: Admit: 2015-06-06 | Payer: Self-pay | Admitting: Gastroenterology

## 2015-06-06 SURGERY — ESOPHAGOGASTRODUODENOSCOPY (EGD) WITH PROPOFOL
Anesthesia: Choice

## 2015-06-13 ENCOUNTER — Encounter: Payer: Self-pay | Admitting: Internal Medicine

## 2015-06-13 ENCOUNTER — Ambulatory Visit (INDEPENDENT_AMBULATORY_CARE_PROVIDER_SITE_OTHER): Payer: Medicare HMO | Admitting: Internal Medicine

## 2015-06-13 ENCOUNTER — Ambulatory Visit
Admission: RE | Admit: 2015-06-13 | Discharge: 2015-06-13 | Disposition: A | Discharge status: Home / Self Care | Payer: Medicare HMO | Source: Ambulatory Visit | Attending: Internal Medicine | Admitting: Internal Medicine

## 2015-06-13 ENCOUNTER — Telehealth: Payer: Self-pay

## 2015-06-13 VITALS — BP 140/72 | HR 88 | Ht 63.0 in | Wt 170.2 lb

## 2015-06-13 DIAGNOSIS — J948 Other specified pleural conditions: Secondary | ICD-10-CM | POA: Diagnosis not present

## 2015-06-13 DIAGNOSIS — K7460 Unspecified cirrhosis of liver: Secondary | ICD-10-CM

## 2015-06-13 DIAGNOSIS — J90 Pleural effusion, not elsewhere classified: Secondary | ICD-10-CM | POA: Diagnosis not present

## 2015-06-13 DIAGNOSIS — R188 Other ascites: Secondary | ICD-10-CM

## 2015-06-13 NOTE — Patient Instructions (Signed)
Esophageal Varices °The esophagus is the passage that connects the throat to the stomach. Esophageal varices are blood vessels in the esophagus that have become enlarged. They develop when extra blood is forced to flow through them because the blood's normal pathway is blocked. Without treatment these blood vessels eventually break and bleed (hemorrhage). A hemorrhage is life-threatening. °CAUSES °This condition may be caused by: °· Scarring of the liver (cirrhosis) due to alcoholism. This is the most common cause. °· Liver disease. °· Severe heart failure. °· A blood clot in the portal vein. °· Sarcoidosis. This is an inflammatory disease that can affect the liver. °· Schistosomiasis. This is a parasitic infection that can cause liver damage. °SYMPTOMS °Usually there are no symptoms unless the esophageal varices bleed. Symptoms of bleeding esophageal varices include: °· Vomiting material that is bright red or that is black and looks like coffee grounds. °· Coughing up blood. °· Black, tarry stools. °· Dizziness or lightheadedness. °· Low blood pressure. °· Loss of consciousness. °DIAGNOSIS °This condition is diagnosed with tests, such as: °· Endoscopy. During this test a thin, lighted tube is inserted through the mouth and into the esophagus. °· Blood tests. These may be done to check liver function, blood counts, and the body's ability to form blood clots. °TREATMENT °This condition may be treated with: °· Medicines that reduce pressure in the esophageal varices and reduce the risk of bleeding. °· Procedures to reduce pressure in the esophageal varices and reduce the risk of bleeding or stop bleeding. These include: °¨ Variceal ligation. In this procedure, a rubber band is placed around the esophageal varices to keep them from bleeding. °¨ Injection therapy. This treatment involves an injection that causes the esophageal varices to shrink and close (sclerotherapy). Medicines that tighten blood vessels or alter  blood flow may also be used. °¨ Balloon tamponade. In this procedure, a tube is put into the esophagus and a balloon is passed through it and inflated. °¨ Transjugular intrahepatic portosystemic shunt (TIPS) placement. In this procedure, a small tube is placed within the liver veins. This decreases blood flow and pressure to the esophageal varices. °· A liver transplant. This may be done if other treatments do not work. °HOME CARE INSTRUCTIONS °· Take medicines only as directed by your health care provider. °· Follow your health care provider's instructions about rest and physical activity. °SEEK IMMEDIATE MEDICAL CARE IF: °· You have any symptoms of this condition after treatment. °· You are unable to eat or drink. °· You have chest pain. °  °This information is not intended to replace advice given to you by your health care provider. Make sure you discuss any questions you have with your health care provider. °  °Document Released: 04/25/2003 Document Revised: 06/19/2014 Document Reviewed: 01/29/2014 °Elsevier Interactive Patient Education ©2016 Elsevier Inc. ° °

## 2015-06-13 NOTE — Progress Notes (Signed)
Date:  06/13/2015   Name:  Jenna Franklin   DOB:  1941/01/06   MRN:  [id number]   Chief Complaint: Follow-up Patient here to follow-up on a pleural effusion. She is currently being evaluated for cirrhosis of the liver. She has an endoscopy scheduled for Monday to rule out esophageal varices. Last visit which was her appointment to establish and was noted to have significantly reduced lung sounds on the left and a moderate size pleural effusion. She had recently finished antibiotics. At the same time her diuretics had been reduced due to side effects. She is now back on a full dose of Lasix and spironolactone. She has lost 18 pounds. She does feel improved and denies significant cough shortness of breath or sputum production. She is here today to have a follow-up chest x-ray to document resolution of the effusion and to rule out an infiltrate.  Review of Systems  Constitutional: Positive for fatigue. Negative for fever and chills.  Respiratory: Negative for chest tightness, shortness of breath and wheezing.   Cardiovascular: Negative for chest pain, palpitations and leg swelling.  Gastrointestinal: Positive for abdominal distention. Negative for vomiting, abdominal pain and diarrhea.  Skin: Negative for color change.  Psychiatric/Behavioral: The patient is nervous/anxious (regarding new diagnosis of cirrhosis).     Patient Active Problem List   Diagnosis Date Noted  . Cirrhosis of liver with ascites ([place]) 05/29/2015  . Non-alcoholic fatty liver disease 05/29/2015  . Post-menopausal atrophic vaginitis 05/29/2015  . Anxiety 03/25/2015  . Acid reflux 03/25/2015  . Calculus of kidney 03/25/2015  . Fast heart beat 03/25/2015  . Impaired glucose tolerance 09/20/2014  . Menopausal problem 04/11/2014  . Adjustment disorder with anxious mood 10/05/2013  . Physical deconditioning 10/05/2013  . Obesity 10/05/2013  . Chronic diastolic CHF (congestive heart failure) ([place]) 10/05/2013  .  SOB (shortness of breath) 10/05/2013  . Nerve root pain 12/11/2011  . Bursitis, trochanteric 12/11/2011    Prior to Admission medications   Medication Sig Start Date End Date Taking? Authorizing Provider  atenolol (TENORMIN) 25 MG tablet Take 25 mg by mouth 2 (two) times daily. Reported on 03/25/2015   Yes Historical Provider, MD  Calcium Carbonate-Vitamin D (CALCIUM + D PO) Take by mouth daily. Reported on 06/07/2015   Yes Historical Provider, MD  estradiol (ESTRACE) 1 MG tablet Take by mouth. Reported on 06/07/2015 07/10/14  Yes Historical Provider, MD  furosemide (LASIX) 20 MG tablet Take 1 tablet (20 mg total) by mouth daily as needed. Patient taking differently: Take 40 mg by mouth daily as needed.  10/05/13  Yes Minna Merritts, MD  omeprazole (PRILOSEC) 20 MG capsule Take by mouth.   Yes Historical Provider, MD  spironolactone (ALDACTONE) 100 MG tablet Take 1 tablet (100 mg total) by mouth 2 (two) times daily. Patient taking differently: Take 150 mg by mouth 2 (two) times daily.  05/30/15  Yes Lucilla Lame, MD  vitamin E 400 UNIT capsule Take 400 Units by mouth daily.   Yes Historical Provider, MD    Allergies  Allergen Reactions  . Codeine     Past Surgical History  Procedure Laterality Date  . Combined hysterectomy abdominal w/ a&p repair / oophorectomy    . Appendectomy    . Tonsillectomy    . Laparoscopic cholecystectomy    . Breast biopsy    . Abdominal hysterectomy      Social History  Substance Use Topics  . Smoking status: Never Smoker   . Smokeless  tobacco: Never Used  . Alcohol Use: No     Medication list has been reviewed and updated.   Physical Exam  Constitutional: She is oriented to person, place, and time. Vital signs are normal. She appears well-developed and well-nourished.  Neck: Normal range of motion. Carotid bruit is not present. No thyromegaly present.  Cardiovascular: Normal rate, regular rhythm and normal heart sounds.   No extrasystoles are  present. Exam reveals no gallop.   Pulmonary/Chest: Effort normal and breath sounds normal. No accessory muscle usage. She has no decreased breath sounds. She has no wheezes.  Abdominal: Soft. She exhibits distension. She exhibits no mass. Bowel sounds are decreased. There is no tenderness.  Musculoskeletal: Edema: both ankles - trace edema.  Neurological: She is alert and oriented to person, place, and time.  Skin: Skin is warm, dry and intact.  Psychiatric: Her speech is normal and behavior is normal. Her mood appears anxious.    BP 140/72 mmHg  Pulse 88  Ht 5\' 3"  (1.6 m)  Wt 170 lb 3.2 oz (77.202 kg)  BMI 30.16 kg/m2  Assessment and Plan: 1. Pleural effusion on right Improved symptomatically and by exam - DG Chest 2 View; Future  2. Cirrhosis of liver with ascites, unspecified hepatic cirrhosis type ([place]) Improving diuresis; plan to rule out varices by EGD next week   Halina Maidens, MD [facility]  06/13/2015

## 2015-06-13 NOTE — Telephone Encounter (Signed)
-----   Message from Glean Hess, MD sent at 06/13/2015  1:50 PM EDT ----- CXR is clear.  No fluid and no pneumonia.

## 2015-06-13 NOTE — Telephone Encounter (Signed)
Spoke with patient. Patient advised of all results and verbalized understanding. Will call back with any future questions or concerns. [facility]  

## 2015-06-14 NOTE — Discharge Instructions (Signed)

## 2015-06-17 ENCOUNTER — Encounter
Admission: RE | Disposition: A | Discharge status: Home / Self Care | Payer: Self-pay | Source: Ambulatory Visit | Attending: Gastroenterology

## 2015-06-17 ENCOUNTER — Ambulatory Visit: Payer: Medicare HMO | Admitting: Anesthesiology

## 2015-06-17 ENCOUNTER — Ambulatory Visit
Admission: RE | Admit: 2015-06-17 | Discharge: 2015-06-17 | Disposition: A | Discharge status: Home / Self Care | Payer: Medicare HMO | Source: Ambulatory Visit | Attending: Gastroenterology | Admitting: Gastroenterology

## 2015-06-17 DIAGNOSIS — Z9049 Acquired absence of other specified parts of digestive tract: Secondary | ICD-10-CM | POA: Insufficient documentation

## 2015-06-17 DIAGNOSIS — M8580 Other specified disorders of bone density and structure, unspecified site: Secondary | ICD-10-CM | POA: Diagnosis not present

## 2015-06-17 DIAGNOSIS — I10 Essential (primary) hypertension: Secondary | ICD-10-CM | POA: Insufficient documentation

## 2015-06-17 DIAGNOSIS — F329 Major depressive disorder, single episode, unspecified: Secondary | ICD-10-CM | POA: Diagnosis not present

## 2015-06-17 DIAGNOSIS — K7460 Unspecified cirrhosis of liver: Secondary | ICD-10-CM | POA: Insufficient documentation

## 2015-06-17 DIAGNOSIS — I8510 Secondary esophageal varices without bleeding: Secondary | ICD-10-CM | POA: Diagnosis not present

## 2015-06-17 DIAGNOSIS — K219 Gastro-esophageal reflux disease without esophagitis: Secondary | ICD-10-CM | POA: Insufficient documentation

## 2015-06-17 DIAGNOSIS — Z90721 Acquired absence of ovaries, unilateral: Secondary | ICD-10-CM | POA: Diagnosis not present

## 2015-06-17 DIAGNOSIS — Z90710 Acquired absence of both cervix and uterus: Secondary | ICD-10-CM | POA: Diagnosis not present

## 2015-06-17 DIAGNOSIS — I8501 Esophageal varices with bleeding: Secondary | ICD-10-CM | POA: Diagnosis not present

## 2015-06-17 DIAGNOSIS — Z79899 Other long term (current) drug therapy: Secondary | ICD-10-CM | POA: Insufficient documentation

## 2015-06-17 DIAGNOSIS — I509 Heart failure, unspecified: Secondary | ICD-10-CM | POA: Diagnosis not present

## 2015-06-17 DIAGNOSIS — R69 Illness, unspecified: Secondary | ICD-10-CM | POA: Diagnosis not present

## 2015-06-17 HISTORY — DX: Unspecified asthma, uncomplicated: J45.909

## 2015-06-17 HISTORY — PX: ESOPHAGOGASTRODUODENOSCOPY (EGD) WITH PROPOFOL: SHX5813

## 2015-06-17 HISTORY — DX: Reserved for inherently not codable concepts without codable children: IMO0001

## 2015-06-17 HISTORY — DX: Unspecified cirrhosis of liver: K74.60

## 2015-06-17 HISTORY — DX: Gastro-esophageal reflux disease without esophagitis: K21.9

## 2015-06-17 HISTORY — DX: Heart failure, unspecified: I50.9

## 2015-06-17 HISTORY — DX: Essential (primary) hypertension: I10

## 2015-06-17 HISTORY — DX: Bronchitis, not specified as acute or chronic: J40

## 2015-06-17 HISTORY — DX: Anemia, unspecified: D64.9

## 2015-06-17 SURGERY — ESOPHAGOGASTRODUODENOSCOPY (EGD) WITH PROPOFOL
Anesthesia: Monitor Anesthesia Care | Site: Throat | Wound class: Clean Contaminated

## 2015-06-17 MED ORDER — ACETAMINOPHEN 325 MG PO TABS: 325.0000 mg | ORAL_TABLET | ORAL | Status: DC | PRN

## 2015-06-17 MED ORDER — ACETAMINOPHEN 160 MG/5ML PO SOLN: 325.0000 mg | ORAL | Status: DC | PRN

## 2015-06-17 MED ORDER — LIDOCAINE HCL (CARDIAC) 20 MG/ML IV SOLN
INTRAVENOUS | Status: DC | PRN
  Administered 2015-06-17: 40 mg via INTRAVENOUS

## 2015-06-17 MED ORDER — GLYCOPYRROLATE 0.2 MG/ML IJ SOLN
INTRAMUSCULAR | Status: DC | PRN
  Administered 2015-06-17: 0.2 mg via INTRAVENOUS

## 2015-06-17 MED ORDER — LACTATED RINGERS IV SOLN
INTRAVENOUS | Status: DC | PRN
  Administered 2015-06-17: 11:00:00 via INTRAVENOUS

## 2015-06-17 MED ORDER — PROPOFOL 10 MG/ML IV BOLUS
INTRAVENOUS | Status: DC | PRN
  Administered 2015-06-17: 30 mg via INTRAVENOUS
  Administered 2015-06-17: 70 mg via INTRAVENOUS
  Administered 2015-06-17: 30 mg via INTRAVENOUS

## 2015-06-17 MED ORDER — LACTATED RINGERS IV SOLN: INTRAVENOUS | Status: DC

## 2015-06-17 SURGICAL SUPPLY — 31 items
BALLN DILATOR 10-12 8 (BALLOONS)
BALLN DILATOR 12-15 8 (BALLOONS)
BALLN DILATOR 15-18 8 (BALLOONS)
BALLN DILATOR CRE 0-12 8 (BALLOONS)
BALLN DILATOR ESOPH 8 10 CRE (MISCELLANEOUS) IMPLANT
BALLOON DILATOR 12-15 8 (BALLOONS) IMPLANT
BALLOON DILATOR 15-18 8 (BALLOONS) IMPLANT
BALLOON DILATOR CRE 0-12 8 (BALLOONS) IMPLANT
BLOCK BITE 60FR ADLT L/F GRN (MISCELLANEOUS) ×2 IMPLANT
CANISTER SUCT 1200ML W/VALVE (MISCELLANEOUS) ×2 IMPLANT
CLIP HMST 235XBRD CATH ROT (MISCELLANEOUS) IMPLANT
CLIP RESOLUTION 360 11X235 (MISCELLANEOUS)
FCP ESCP3.2XJMB 240X2.8X (MISCELLANEOUS)
FORCEPS BIOP RAD 4 LRG CAP 4 (CUTTING FORCEPS) IMPLANT
FORCEPS BIOP RJ4 240 W/NDL (MISCELLANEOUS)
FORCEPS ESCP3.2XJMB 240X2.8X (MISCELLANEOUS) IMPLANT
GOWN CVR UNV OPN BCK APRN NK (MISCELLANEOUS) ×2 IMPLANT
GOWN ISOL THUMB LOOP REG UNIV (MISCELLANEOUS) ×2
INJECTOR VARIJECT VIN23 (MISCELLANEOUS) IMPLANT
KIT DEFENDO VALVE AND CONN (KITS) IMPLANT
KIT ENDO PROCEDURE OLY (KITS) ×2 IMPLANT
MARKER SPOT ENDO TATTOO 5ML (MISCELLANEOUS) IMPLANT
PAD GROUND ADULT SPLIT (MISCELLANEOUS) IMPLANT
SNARE SHORT THROW 13M SML OVAL (MISCELLANEOUS) IMPLANT
SNARE SHORT THROW 30M LRG OVAL (MISCELLANEOUS) IMPLANT
SPOT EX ENDOSCOPIC TATTOO (MISCELLANEOUS)
SYR INFLATION 60ML (SYRINGE) IMPLANT
TRAP ETRAP POLY (MISCELLANEOUS) IMPLANT
VARIJECT INJECTOR VIN23 (MISCELLANEOUS)
WATER STERILE IRR 250ML POUR (IV SOLUTION) ×2 IMPLANT
WIRE CRE 18-20MM 8CM F G (MISCELLANEOUS) IMPLANT

## 2015-06-17 NOTE — Anesthesia Procedure Notes (Signed)
Procedure Name: MAC Performed by: Cherri Yera Pre-anesthesia Checklist: Patient identified, Emergency Drugs available, Suction available, Timeout performed and Patient being monitored Patient Re-evaluated:Patient Re-evaluated prior to inductionOxygen Delivery Method: Nasal cannula Placement Confirmation: positive ETCO2       

## 2015-06-17 NOTE — Op Note (Signed)
[facility] Gastroenterology Patient Name: Jenna Franklin Procedure Date: 06/17/2015 10:31 AM MRN: [id number] Account #: [id number] Date of Birth: [birth year] Admit Type: Outpatient Age: 75 Room: [facility] OR ROOM 01 Gender: Female Note Status: Finalized Procedure:            Upper GI endoscopy Indications:          Cirrhosis rule out esophageal varices Providers:            Lucilla Lame, MD Referring MD:         Halina Maidens, MD (Referring MD) Medicines:            Propofol per Anesthesia Complications:        No immediate complications. Procedure:            Pre-Anesthesia Assessment:                       - Prior to the procedure, a History and Physical was                        performed, and patient medications and allergies were                        reviewed. The patient's tolerance of previous                        anesthesia was also reviewed. The risks and benefits of                        the procedure and the sedation options and risks were                        discussed with the patient. All questions were                        answered, and informed consent was obtained. Prior                        Anticoagulants: The patient has taken no previous                        anticoagulant or antiplatelet agents. ASA Grade                        Assessment: II - A patient with mild systemic disease.                        After reviewing the risks and benefits, the patient was                        deemed in satisfactory condition to undergo the                        procedure.                       After obtaining informed consent, the endoscope was                        passed under direct vision. Throughout the procedure,  the patient's blood pressure, pulse, and oxygen                        saturations were monitored continuously. The Olympus                        GIF [id number] colonscope [id number]) was introduced              through the mouth, and advanced to the second part of                        duodenum. The upper GI endoscopy was accomplished                        without difficulty. The patient tolerated the procedure                        well. Findings:      Grade I varices were found in the lower third of the esophagus.      Food (residue) was found in the entire examined stomach.      The examined duodenum was normal. Impression:           - Grade I esophageal varices.                       - Food (residue) in the stomach.                       - Normal examined duodenum.                       - No specimens collected. Recommendation:       - Repeat upper endoscopy in 2 years for surveillance. Procedure Code(s):    --- Professional ---                       [phone], Esophagogastroduodenoscopy, flexible, transoral;                        diagnostic, including collection of specimen(s) by                        brushing or washing, when performed (separate procedure) Diagnosis Code(s):    --- Professional ---                       K74.60, Unspecified cirrhosis of liver                       I85.10, Secondary esophageal varices without bleeding CPT copyright 2016 American Medical Association. All rights reserved. The codes documented in this report are preliminary and upon coder review may  be revised to meet current compliance requirements. Lucilla Lame, MD 06/17/2015 10:51:43 AM This report has been signed electronically. Number of Addenda: 0 Note Initiated On: 06/17/2015 10:31 AM      [facility]

## 2015-06-17 NOTE — Transfer of Care (Signed)
Immediate Anesthesia Transfer of Care Note  Patient: Jenna Franklin  Procedure(s) Performed: Procedure(s): ESOPHAGOGASTRODUODENOSCOPY (EGD) WITH PROPOFOL (N/A)  Patient Location: PACU  Anesthesia Type: MAC  Level of Consciousness: awake, alert  and patient cooperative  Airway and Oxygen Therapy: Patient Spontanous Breathing and Patient connected to supplemental oxygen  Post-op Assessment: Post-op Vital signs reviewed, Patient's Cardiovascular Status Stable, Respiratory Function Stable, Patent Airway and No signs of Nausea or vomiting  Post-op Vital Signs: Reviewed and stable  Complications: No apparent anesthesia complications

## 2015-06-17 NOTE — Anesthesia Postprocedure Evaluation (Signed)
Anesthesia Post Note  Patient: Jenna Franklin  Procedure(s) Performed: Procedure(s) (LRB): ESOPHAGOGASTRODUODENOSCOPY (EGD) WITH PROPOFOL (N/A)  Patient location during evaluation: PACU Anesthesia Type: MAC Level of consciousness: awake and alert and oriented Pain management: satisfactory to patient Vital Signs Assessment: post-procedure vital signs reviewed and stable Respiratory status: spontaneous breathing, nonlabored ventilation and respiratory function stable Cardiovascular status: blood pressure returned to baseline and stable Postop Assessment: Adequate PO intake and No signs of nausea or vomiting Anesthetic complications: no    Raliegh Ip

## 2015-06-17 NOTE — Anesthesia Preprocedure Evaluation (Signed)
Anesthesia Evaluation  Patient identified by MRN, date of birth, ID band  Reviewed: Allergy & Precautions, H&P , NPO status , Patient's Chart, lab work & pertinent test results  Airway Mallampati: II  TM Distance: >3 FB Neck ROM: full    Dental no notable dental hx.    Pulmonary    Pulmonary exam normal        Cardiovascular hypertension,  Rhythm:regular Rate:Normal     Neuro/Psych PSYCHIATRIC DISORDERS    GI/Hepatic GERD  ,(+) Cirrhosis       ,   Endo/Other    Renal/GU      Musculoskeletal   Abdominal   Peds  Hematology   Anesthesia Other Findings   Reproductive/Obstetrics                             Anesthesia Physical Anesthesia Plan  ASA: III  Anesthesia Plan: MAC   Post-op Pain Management:    Induction:   Airway Management Planned:   Additional Equipment:   Intra-op Plan:   Post-operative Plan:   Informed Consent: I have reviewed the patients History and Physical, chart, labs and discussed the procedure including the risks, benefits and alternatives for the proposed anesthesia with the patient or authorized representative who has indicated his/her understanding and acceptance.     Plan Discussed with: CRNA  Anesthesia Plan Comments:         Anesthesia Quick Evaluation

## 2015-06-17 NOTE — H&P (Signed)
[facility]  [address], [place],  [address] Phone: [phone] Fax : [phone]  Primary Care Physician:  Halina Maidens, MD Primary Gastroenterologist:  Dr. Allen Norris  Pre-Procedure History & Physical: HPI:  Jenna Franklin is a 75 y.o. female is here for an endoscopy.   Past Medical History  Diagnosis Date  . Osteopenia   . Reflux   . Non-alcoholic fatty liver disease   . Vitamin D deficiency   . H/O: depression   . Osteopenia   . Reflux   . Nonalcoholic fatty liver disease   . Vitamin D deficiency   . Hypertension   . CHF (congestive heart failure) ([place])     pt says she thinks she has this  . Anemia   . Bronchitis     currently  . Shortness of breath dyspnea   . Cirrhosis of liver ([place])   . Asthma   . GERD (gastroesophageal reflux disease)     Past Surgical History  Procedure Laterality Date  . Combined hysterectomy abdominal w/ a&p repair / oophorectomy    . Appendectomy    . Tonsillectomy    . Laparoscopic cholecystectomy    . Breast biopsy    . Abdominal hysterectomy      Prior to Admission medications   Medication Sig Start Date End Date Taking? Authorizing Provider  atenolol (TENORMIN) 25 MG tablet Take 25 mg by mouth 2 (two) times daily. Reported on 03/25/2015   Yes Historical Provider, MD  Calcium Carbonate-Vitamin D (CALCIUM + D PO) Take by mouth daily. Reported on 06/07/2015   Yes Historical Provider, MD  furosemide (LASIX) 20 MG tablet Take 1 tablet (20 mg total) by mouth daily as needed. Patient taking differently: Take 40 mg by mouth daily as needed.  10/05/13  Yes Minna Merritts, MD  omeprazole (PRILOSEC) 20 MG capsule Take by mouth.   Yes Historical Provider, MD  spironolactone (ALDACTONE) 100 MG tablet Take 1 tablet (100 mg total) by mouth 2 (two) times daily. Patient taking differently: Take 150 mg by mouth 2 (two) times daily.  05/30/15  Yes Lucilla Lame, MD  vitamin E 400 UNIT capsule Take 400 Units by mouth  daily.   Yes Historical Provider, MD  estradiol (ESTRACE) 1 MG tablet Take by mouth. Reported on 06/07/2015 07/10/14   Historical Provider, MD    Allergies as of 06/03/2015 - Review Complete 05/30/2015  Allergen Reaction Noted  . Codeine  09/28/2013    Family History  Problem Relation Age of Onset  . Heart disease Mother   . Heart disease Father   . Heart attack Paternal Grandfather   . Asthma Son 51    Social History   Social History  . Marital Status: Widowed    Spouse Name: N/A  . Number of Children: N/A  . Years of Education: N/A   Occupational History  . Not on file.   Social History Main Topics  . Smoking status: Never Smoker   . Smokeless tobacco: Never Used  . Alcohol Use: No  . Drug Use: No  . Sexual Activity: Not on file   Other Topics Concern  . Not on file   Social History Narrative    Review of Systems: See HPI, otherwise negative ROS  Physical Exam: BP 127/56 mmHg  Pulse 95  Temp(Src) 97.5 F (36.4 C) (Temporal)  Wt 170 lb (77.111 kg)  SpO2 95% General:   Alert,  pleasant and cooperative in NAD Head:  Normocephalic and atraumatic.  Neck:  Supple; no masses or thyromegaly. Lungs:  Clear throughout to auscultation.    Heart:  Regular rate and rhythm. Abdomen:  Soft, nontender and nondistended. Normal bowel sounds, without guarding, and without rebound.   Neurologic:  Alert and  oriented x4;  grossly normal neurologically.  Impression/Plan: Jenna Franklin is here for an endoscopy to be performed for cirrhosis  Risks, benefits, limitations, and alternatives regarding  endoscopy have been reviewed with the patient.  Questions have been answered.  All parties agreeable.   Ollen Bowl, MD  06/17/2015, 10:04 AM

## 2015-06-18 ENCOUNTER — Encounter: Payer: Self-pay | Admitting: Gastroenterology

## 2015-06-27 ENCOUNTER — Telehealth: Payer: Self-pay | Admitting: Gastroenterology

## 2015-06-27 NOTE — Telephone Encounter (Signed)
Had endoscopy Monday and having pain right below her should blade. Breast bone is burning. What kind of pain medication can she take?

## 2015-06-28 NOTE — Telephone Encounter (Signed)
Returned phone call to patient at this time. She states that she has a bruise on her back where this is hurting and pain is worse with movement. Explained to patient that this is most likely due to being moved off of the table after procedure was finished. Explained to patient that she should try using some Ibuprofen, up to 600mg  for the next 3 days every 8 hours to help with inflammation. Also told patient she can apply Ice to area 3-4 times daily to help as well. Patient verbalized understanding.

## 2015-07-04 ENCOUNTER — Ambulatory Visit
Admission: RE | Admit: 2015-07-04 | Discharge: 2015-07-04 | Disposition: A | Discharge status: Home / Self Care | Payer: Medicare HMO | Source: Ambulatory Visit | Attending: Internal Medicine | Admitting: Internal Medicine

## 2015-07-04 DIAGNOSIS — Z1231 Encounter for screening mammogram for malignant neoplasm of breast: Secondary | ICD-10-CM | POA: Diagnosis not present

## 2015-07-04 DIAGNOSIS — Z1239 Encounter for other screening for malignant neoplasm of breast: Secondary | ICD-10-CM

## 2015-07-19 ENCOUNTER — Other Ambulatory Visit: Payer: Self-pay | Admitting: Internal Medicine

## 2015-07-19 ENCOUNTER — Encounter: Payer: Self-pay | Admitting: Internal Medicine

## 2015-07-19 ENCOUNTER — Ambulatory Visit (INDEPENDENT_AMBULATORY_CARE_PROVIDER_SITE_OTHER): Payer: Medicare HMO | Admitting: Internal Medicine

## 2015-07-19 VITALS — BP 112/62 | HR 64 | Ht 63.0 in | Wt 162.0 lb

## 2015-07-19 DIAGNOSIS — I5032 Chronic diastolic (congestive) heart failure: Secondary | ICD-10-CM

## 2015-07-19 DIAGNOSIS — R188 Other ascites: Secondary | ICD-10-CM

## 2015-07-19 DIAGNOSIS — K7460 Unspecified cirrhosis of liver: Secondary | ICD-10-CM | POA: Diagnosis not present

## 2015-07-19 DIAGNOSIS — N952 Postmenopausal atrophic vaginitis: Secondary | ICD-10-CM | POA: Diagnosis not present

## 2015-07-19 DIAGNOSIS — M62830 Muscle spasm of back: Secondary | ICD-10-CM | POA: Diagnosis not present

## 2015-07-19 MED ORDER — FUROSEMIDE 40 MG PO TABS: 40.0000 mg | ORAL_TABLET | Freq: Every day | ORAL | Status: DC

## 2015-07-19 MED ORDER — BACLOFEN 10 MG PO TABS: 10.0000 mg | ORAL_TABLET | Freq: Three times a day (TID) | ORAL | Status: DC

## 2015-07-19 MED ORDER — ATENOLOL 25 MG PO TABS: 25.0000 mg | ORAL_TABLET | Freq: Two times a day (BID) | ORAL | Status: DC

## 2015-07-19 MED ORDER — ESTRADIOL 0.5 MG PO TABS: 0.5000 mg | ORAL_TABLET | Freq: Every day | ORAL | Status: DC

## 2015-07-19 NOTE — Progress Notes (Signed)
Date:  07/19/2015   Name:  Jenna Franklin   DOB:  [birth year]   MRN:  [id number]   Chief Complaint: Shoulder Pain and Medication Refill Shoulder Pain  The pain is present in the left shoulder and right shoulder. This is a new problem. The current episode started 1 to 4 weeks ago. The problem occurs constantly. The quality of the pain is described as aching. Associated symptoms comments: Started after EGD - neck and shoulders are tight.  Vaginal Pain Primary symptoms comment: atrophic vaginitis. This is a new problem. The problem has been gradually improving. The patient is experiencing no pain. Pertinent negatives include no abdominal pain or chills.  Cirrhosis - she had EGD recently - showed minimal varices.  She is on a healthy diet and hopefully losing weight.  She also continues on Lasix + Spironolactone and diuresing well.   Review of Systems  Constitutional: Positive for unexpected weight change (8 lbs diuresis). Negative for chills and fatigue.  Respiratory: Negative for chest tightness and shortness of breath.   Cardiovascular: Negative for chest pain, palpitations and leg swelling.  Gastrointestinal: Negative for abdominal pain and blood in stool.  Genitourinary: Positive for vaginal pain (and beast pain since starting estrogen).  Musculoskeletal: Positive for myalgias and arthralgias.  Hematological: Negative for adenopathy.    Patient Active Problem List   Diagnosis Date Noted  . Hepatic cirrhosis ([place])   . Esophageal varices in cirrhosis (H[facility])   . Cirrhosis of liver with ascites ([place]) 05/29/2015  . Non-alcoholic fatty liver disease 05/29/2015  . Post-menopausal atrophic vaginitis 05/29/2015  . Anxiety 03/25/2015  . Acid reflux 03/25/2015  . Calculus of kidney 03/25/2015  . Fast heart beat 03/25/2015  . Impaired glucose tolerance 09/20/2014  . Menopausal problem 04/11/2014  . Adjustment disorder with anxious mood 10/05/2013  . Physical deconditioning 10/05/2013    . Obesity 10/05/2013  . Chronic diastolic CHF (congestive heart failure) ([place]) 10/05/2013  . SOB (shortness of breath) 10/05/2013  . Nerve root pain 12/11/2011  . Bursitis, trochanteric 12/11/2011    Prior to Admission medications   Medication Sig Start Date End Date Taking? Authorizing Provider  atenolol (TENORMIN) 25 MG tablet Take 25 mg by mouth 2 (two) times daily. Reported on 03/25/2015   Yes Historical Provider, MD  Calcium Carbonate-Vitamin D (CALCIUM + D PO) Take by mouth daily. Reported on 06/07/2015   Yes Historical Provider, MD  estradiol (ESTRACE) 1 MG tablet Take by mouth. Reported on 06/07/2015 07/10/14  Yes Historical Provider, MD  furosemide (LASIX) 20 MG tablet Take 1 tablet (20 mg total) by mouth daily as needed. Patient taking differently: Take 40 mg by mouth daily as needed.  10/05/13  Yes Minna Merritts, MD  omeprazole (PRILOSEC) 20 MG capsule Take by mouth.   Yes Historical Provider, MD  spironolactone (ALDACTONE) 100 MG tablet Take 1 tablet (100 mg total) by mouth 2 (two) times daily. Patient taking differently: Take 150 mg by mouth 2 (two) times daily.  05/30/15  Yes Lucilla Lame, MD  vitamin E 400 UNIT capsule Take 400 Units by mouth daily.   Yes Historical Provider, MD    Allergies  Allergen Reactions  . Codeine     Past Surgical History  Procedure Laterality Date  . Combined hysterectomy abdominal w/ a&p repair / oophorectomy    . Appendectomy    . Tonsillectomy    . Laparoscopic cholecystectomy    . Abdominal hysterectomy    . Esophagogastroduodenoscopy (egd) with propofol  N/A 06/17/2015    Procedure: ESOPHAGOGASTRODUODENOSCOPY (EGD) WITH PROPOFOL;  Surgeon: Lucilla Lame, MD;  Location: [facility];  Service: Endoscopy;  Laterality: N/A;  . Breast biopsy      neg    Social History  Substance Use Topics  . Smoking status: Never Smoker   . Smokeless tobacco: Never Used  . Alcohol Use: No     Medication list has been reviewed and  updated.   Physical Exam  Constitutional: She is oriented to person, place, and time. She appears well-developed and well-nourished. No distress.  HENT:  Head: Normocephalic and atraumatic.  Neck: Normal range of motion. Neck supple. No thyromegaly present.  Cardiovascular: Normal rate, regular rhythm and normal heart sounds.   Pulmonary/Chest: Effort normal and breath sounds normal. No respiratory distress. She has no wheezes.  Abdominal: Soft. Normal appearance. There is no tenderness.  Musculoskeletal: Normal range of motion.  Neurological: She is alert and oriented to person, place, and time.  Skin: Skin is warm and dry. No rash noted.  Psychiatric: She has a normal mood and affect. Her behavior is normal. Thought content normal.  Nursing note and vitals reviewed.   BP 112/62 mmHg  Pulse 64  Ht 5\' 3"  (1.6 m)  Wt 162 lb (73.483 kg)  BMI 28.70 kg/m2  Assessment and Plan: 1. Muscle spasm of back Continue heat or ice plus tylenol - baclofen (LIORESAL) 10 MG tablet; Take 1 tablet (10 mg total) by mouth 3 (three) times daily.  Dispense: 60 each; Refill: 0  2. Post-menopausal atrophic vaginitis Reduce dose of estrace - estradiol (ESTRACE) 0.5 MG tablet; Take 1 tablet (0.5 mg total) by mouth daily. Reported on 06/07/2015  Dispense: 90 tablet; Refill: 3  3. Cirrhosis of liver with ascites, unspecified hepatic cirrhosis type ([place]) Improved with good diuresis; minimal varices - furosemide (LASIX) 40 MG tablet; Take 1 tablet (40 mg total) by mouth daily.  Dispense: 90 tablet; Refill: 3  4. Chronic diastolic CHF (congestive heart failure) ([facility]) Continue beta blocker - atenolol (TENORMIN) 25 MG tablet; Take 1 tablet (25 mg total) by mouth 2 (two) times daily. Reported on 03/25/2015  Dispense: 180 tablet; Refill: [place], MD [facility]  07/19/2015

## 2015-07-23 ENCOUNTER — Telehealth: Payer: Self-pay

## 2015-07-23 NOTE — Telephone Encounter (Signed)
Patient wants to know why pharmacy is not filling her estridol. Called to tell her we started PA but no answer.

## 2015-07-30 ENCOUNTER — Ambulatory Visit: Payer: Medicare HMO | Admitting: Internal Medicine

## 2015-08-02 DIAGNOSIS — D3131 Benign neoplasm of right choroid: Secondary | ICD-10-CM | POA: Diagnosis not present

## 2015-08-14 ENCOUNTER — Telehealth: Payer: Self-pay

## 2015-08-14 NOTE — Telephone Encounter (Signed)
Patient has a question regarding her bp medication - stating her bp is running low and was wanting to know if she could cut it in half. Medication: Atenolol 25 mg bid

## 2015-08-16 ENCOUNTER — Ambulatory Visit (INDEPENDENT_AMBULATORY_CARE_PROVIDER_SITE_OTHER): Payer: Medicare HMO | Admitting: Internal Medicine

## 2015-08-16 ENCOUNTER — Encounter: Payer: Self-pay | Admitting: Internal Medicine

## 2015-08-16 VITALS — BP 118/78 | HR 85 | Resp 16 | Ht 63.0 in | Wt 165.4 lb

## 2015-08-16 DIAGNOSIS — M546 Pain in thoracic spine: Secondary | ICD-10-CM

## 2015-08-16 DIAGNOSIS — I8510 Secondary esophageal varices without bleeding: Secondary | ICD-10-CM

## 2015-08-16 DIAGNOSIS — R634 Abnormal weight loss: Secondary | ICD-10-CM

## 2015-08-16 DIAGNOSIS — R188 Other ascites: Secondary | ICD-10-CM

## 2015-08-16 DIAGNOSIS — K7460 Unspecified cirrhosis of liver: Secondary | ICD-10-CM | POA: Diagnosis not present

## 2015-08-16 NOTE — Progress Notes (Signed)
Date:  08/16/2015   Name:  Jenna Franklin   DOB:  1940/05/17   MRN:  [id number]   Chief Complaint: Hypertension Hypertension This is a chronic problem. The current episode started more than 1 year ago. The problem is controlled (with more low readings). Pertinent negatives include no chest pain, headaches, palpitations or shortness of breath. Past treatments include beta blockers and diuretics.  Constipation This is a new problem. The stool is described as firm. The patient is not on a high fiber diet. She does not exercise regularly. There has been adequate water intake. Associated symptoms include nausea. Pertinent negatives include no fever.  Cirrhosis with Ascites - recent EGD done showing esophageal varices.  On aggressive diuretic regimen.  Patient concerned because BPs have been lower.  Yesterday 98 systolic but was able to perform housework without symptoms. Weight loss - patient's weight was 225 lb in April at her maximum.  She has been aggressively diuresed with spironolactone 150 mg bid and furosemide 40 mg daily.  She has also been a strict no salt, low fat diet.  Her energy is low intermittently.  She has decreased appetite and nausea without vomiting. Mid back pain - patient complains of mid back pain on right when standing and cooking.  Tingling sensation radiates to the right anterior lower ribs at times but is transient.  Tylenol has not been helpful.  Lab Results  Component Value Date   CREATININE 0.76 05/30/2015   BP Readings from Last 3 Encounters:  08/16/15 118/78  07/19/15 112/62  06/17/15 106/60   Wt Readings from Last 3 Encounters:  08/16/15 165 lb 6.4 oz (75.025 kg)  07/19/15 162 lb (73.483 kg)  06/17/15 170 lb (77.111 kg)     Review of Systems  Constitutional: Positive for fatigue. Negative for fever, chills and unexpected weight change.  Respiratory: Negative for chest tightness, shortness of breath and wheezing.   Cardiovascular: Negative for chest  pain, palpitations and leg swelling.  Gastrointestinal: Positive for nausea and constipation. Negative for blood in stool and abdominal distention.  Genitourinary: Negative for hematuria.  Neurological: Positive for tremors. Negative for dizziness, syncope, light-headedness and headaches.  Psychiatric/Behavioral: Negative for agitation.    Patient Active Problem List   Diagnosis Date Noted  . Esophageal varices in cirrhosis ([facility])   . Cirrhosis of liver with ascites ([place]) 05/29/2015  . Non-alcoholic fatty liver disease 05/29/2015  . Post-menopausal atrophic vaginitis 05/29/2015  . Anxiety 03/25/2015  . Acid reflux 03/25/2015  . Calculus of kidney 03/25/2015  . Fast heart beat 03/25/2015  . Impaired glucose tolerance 09/20/2014  . Menopausal problem 04/11/2014  . Adjustment disorder with anxious mood 10/05/2013  . Physical deconditioning 10/05/2013  . Obesity 10/05/2013  . SOB (shortness of breath) 10/05/2013  . Nerve root pain 12/11/2011  . Bursitis, trochanteric 12/11/2011    Prior to Admission medications   Medication Sig Start Date End Date Taking? Authorizing Provider  atenolol (TENORMIN) 25 MG tablet Take 1 tablet (25 mg total) by mouth 2 (two) times daily. Reported on 03/25/2015 Patient taking differently: Take 12.5 mg by mouth 2 (two) times daily. Reported on 03/25/2015 07/19/15  Yes Glean Hess, MD  baclofen (LIORESAL) 10 MG tablet Take 1 tablet (10 mg total) by mouth 3 (three) times daily. 07/19/15  Yes Glean Hess, MD  Calcium Carbonate-Vitamin D (CALCIUM + D PO) Take by mouth daily. Reported on 06/07/2015   Yes Historical Provider, MD  estradiol (ESTRACE) 0.5 MG tablet Take  1 tablet (0.5 mg total) by mouth daily. Reported on 06/07/2015 07/19/15  Yes Glean Hess, MD  furosemide (LASIX) 40 MG tablet Take 1 tablet (40 mg total) by mouth daily. 07/19/15  Yes Glean Hess, MD  omeprazole (PRILOSEC) 20 MG capsule Take by mouth.   Yes Historical Provider, MD    spironolactone (ALDACTONE) 100 MG tablet Take 1 tablet (100 mg total) by mouth 2 (two) times daily. Patient taking differently: Take 150 mg by mouth 2 (two) times daily.  05/30/15  Yes Lucilla Lame, MD  vitamin E 400 UNIT capsule Take 400 Units by mouth daily.   Yes Historical Provider, MD    Allergies  Allergen Reactions  . Codeine     Past Surgical History  Procedure Laterality Date  . Combined hysterectomy abdominal w/ a&p repair / oophorectomy    . Appendectomy    . Tonsillectomy    . Laparoscopic cholecystectomy    . Abdominal hysterectomy    . Esophagogastroduodenoscopy (egd) with propofol N/A 06/17/2015    Procedure: ESOPHAGOGASTRODUODENOSCOPY (EGD) WITH PROPOFOL;  Surgeon: Lucilla Lame, MD;  Location: [facility];  Service: Endoscopy;  Laterality: N/A;  . Breast biopsy      neg    Social History  Substance Use Topics  . Smoking status: Never Smoker   . Smokeless tobacco: Never Used  . Alcohol Use: No     Medication list has been reviewed and updated.   Physical Exam  Constitutional: She is oriented to person, place, and time. She appears well-developed. No distress.  HENT:  Head: Normocephalic and atraumatic.  Neck: Normal range of motion. Neck supple. No thyromegaly present.  Cardiovascular: Normal rate, regular rhythm and normal heart sounds.   Pulmonary/Chest: Effort normal and breath sounds normal. No respiratory distress. She has no wheezes.  Abdominal: Soft. Bowel sounds are normal. She exhibits no mass. There is no tenderness.  No fluid wave  Musculoskeletal: Normal range of motion. She exhibits no edema.       Thoracic back: She exhibits tenderness and spasm.  Neurological: She is alert and oriented to person, place, and time.  Skin: Skin is warm and dry. No rash noted.  Psychiatric: She has a normal mood and affect. Her behavior is normal. Thought content normal.  Nursing note and vitals reviewed.   BP 118/78 mmHg  Pulse 85  Resp 16  Ht 5'  3" (1.6 m)  Wt 165 lb 6.4 oz (75.025 kg)  BMI 29.31 kg/m2  SpO2 96%  Assessment and Plan: 1. Esophageal varices in cirrhosis ([facility]) Continue current dose of diuretics Reduce atenolol to 12.5 mg bid due to low systolic BP but do not want to stop completely Will send note to Dr. Allen Norris  Weight daily - call if more than 1-2 pound fluctuations - consider lowering dose of spironolactone  2. Cirrhosis of liver with ascites, unspecified hepatic cirrhosis type ([place]) Doing well with low salt and low fat diet - Comprehensive metabolic panel  3. Right-sided thoracic back pain Suspect muscle spasm in mid back as cause Recommend changing positions while cooking Low dose tylenol okay Try heat  4. Recent weight loss Contributed to by significant dietary changes but will check thyroid Recommend liberalizing diet by cooking at home to avoid salt and fat - maintain 1200 mg sodium per day or less Take colace PRN constipation - TSH   Halina Maidens, MD [facility]  08/16/2015

## 2015-08-17 LAB — COMPREHENSIVE METABOLIC PANEL
A/G RATIO: 1.1 — AB (ref 1.2–2.2)
ALK PHOS: 68 IU/L (ref 39–117)
ALT: 26 IU/L (ref 0–32)
AST: 33 IU/L (ref 0–40)
Albumin: 3.9 g/dL (ref 3.5–4.8)
BILIRUBIN TOTAL: 1.3 mg/dL — AB (ref 0.0–1.2)
BUN/Creatinine Ratio: 21 (ref 12–28)
BUN: 22 mg/dL (ref 8–27)
CHLORIDE: 100 mmol/L (ref 96–106)
CO2: 18 mmol/L (ref 18–29)
Calcium: 9.1 mg/dL (ref 8.7–10.3)
Creatinine, Ser: 1.05 mg/dL — ABNORMAL HIGH (ref 0.57–1.00)
GFR calc non Af Amer: 52 mL/min/{1.73_m2} — ABNORMAL LOW (ref >59)
GFR, EST AFRICAN AMERICAN: 60 mL/min/{1.73_m2} (ref >59)
GLUCOSE: 149 mg/dL — AB (ref 65–99)
Globulin, Total: 3.4 g/dL (ref 1.5–4.5)
POTASSIUM: 4.8 mmol/L (ref 3.5–5.2)
Sodium: 138 mmol/L (ref 134–144)
TOTAL PROTEIN: 7.3 g/dL (ref 6.0–8.5)

## 2015-08-17 LAB — TSH: TSH: 1.74 u[IU]/mL (ref 0.450–4.500)

## 2015-08-21 ENCOUNTER — Other Ambulatory Visit: Payer: Self-pay | Admitting: Internal Medicine

## 2015-08-21 DIAGNOSIS — N952 Postmenopausal atrophic vaginitis: Secondary | ICD-10-CM

## 2015-08-21 MED ORDER — ESTRADIOL 0.5 MG PO TABS: 0.5000 mg | ORAL_TABLET | Freq: Every day | ORAL | Status: DC

## 2015-08-22 ENCOUNTER — Telehealth: Payer: Self-pay

## 2015-08-22 NOTE — Telephone Encounter (Signed)
Patient would like you to call her back about estradiol for her vaginal infection - doctor will need to call in script to [facility] in [place]. Please call her back at your earliest convince.

## 2015-08-22 NOTE — Telephone Encounter (Signed)
Patient called back wanting me to fill estridol. Please review.

## 2015-08-23 ENCOUNTER — Telehealth: Payer: Self-pay

## 2015-08-23 NOTE — Telephone Encounter (Signed)
Advised OV.

## 2015-08-23 NOTE — Telephone Encounter (Signed)
Patient is needing a prescription for Estradiol 1 mg/gm vaginal cream with applicator stated her old medication was out of date and is needing a new script. [facility] [place] so they can order it for the patient and please let patient know once this has been done. She has also left a voicemail on nurse's phone.

## 2015-08-23 NOTE — Telephone Encounter (Signed)
I sent in the estradiol this week.  Have her check with the pharmacy.

## 2015-08-26 ENCOUNTER — Ambulatory Visit (INDEPENDENT_AMBULATORY_CARE_PROVIDER_SITE_OTHER): Payer: Medicare HMO | Admitting: Internal Medicine

## 2015-08-26 ENCOUNTER — Encounter: Payer: Self-pay | Admitting: Internal Medicine

## 2015-08-26 VITALS — BP 98/68 | HR 84 | Resp 16 | Ht 63.0 in | Wt 168.0 lb

## 2015-08-26 DIAGNOSIS — N760 Acute vaginitis: Secondary | ICD-10-CM

## 2015-08-26 DIAGNOSIS — M792 Neuralgia and neuritis, unspecified: Secondary | ICD-10-CM | POA: Diagnosis not present

## 2015-08-26 DIAGNOSIS — M5410 Radiculopathy, site unspecified: Secondary | ICD-10-CM

## 2015-08-26 LAB — POCT WET PREP WITH KOH
CLUE CELLS WET PREP PER HPF POC: 5
KOH Prep POC: POSITIVE
RBC WET PREP PER HPF POC: 0
TRICHOMONAS UA: NEGATIVE

## 2015-08-26 MED ORDER — FLUCONAZOLE 100 MG PO TABS: 100.0000 mg | ORAL_TABLET | Freq: Every day | ORAL | Status: DC

## 2015-08-26 MED ORDER — CYCLOBENZAPRINE HCL 10 MG PO TABS: 10.0000 mg | ORAL_TABLET | Freq: Every day | ORAL | Status: DC

## 2015-08-26 MED ORDER — METRONIDAZOLE 500 MG PO TABS: 500.0000 mg | ORAL_TABLET | Freq: Two times a day (BID) | ORAL | Status: DC

## 2015-08-26 NOTE — Patient Instructions (Signed)
Stop estrogen cream - resume the lower dose estrogen tablet  May take Tylenol 650 mg one three times a day as needed

## 2015-08-26 NOTE — Progress Notes (Signed)
Date:  08/26/2015   Name:  Jenna Franklin   DOB:  1940-09-26   MRN:  [id number]   Chief Complaint: Vaginal Itching; Insomnia; and Spasms Vaginitis - has tried femring for many years.  Last year she was given estrogen pills instead but the ring.  But then she had extreme breast tenderness.  Last visit we discussed trying a lower dose estrogen but she did not try it.  Last week she tried an estrogen vaginal cream but she is now out.  She is now out and she does not think it has helped. She has itching inside and out and burning.  There is not more irritation with urination.  She feels very tender and irritated. Neck stiffness - ongoing issues with pain and stiffness.  She had a massage which helped.  Dr. Allen Norris told her she could take tylenol 650 mg tid as needed.  Review of Systems  Constitutional: Negative for chills.  Respiratory: Negative for chest tightness and shortness of breath.   Cardiovascular: Negative for chest pain, palpitations and leg swelling.  Genitourinary: Positive for frequency, vaginal discharge, genital sores and vaginal pain.  Musculoskeletal: Positive for myalgias and neck stiffness.    Patient Active Problem List   Diagnosis Date Noted  . Esophageal varices in cirrhosis (HCC)   . Cirrhosis of liver with ascites ([place]) 05/29/2015  . Non-alcoholic fatty liver disease 05/29/2015  . Post-menopausal atrophic vaginitis 05/29/2015  . Anxiety 03/25/2015  . Acid reflux 03/25/2015  . Calculus of kidney 03/25/2015  . Fast heart beat 03/25/2015  . Impaired glucose tolerance 09/20/2014  . Adjustment disorder with anxious mood 10/05/2013  . Physical deconditioning 10/05/2013  . Nerve root pain 12/11/2011  . Bursitis, trochanteric 12/11/2011    Prior to Admission medications   Medication Sig Start Date End Date Taking? Authorizing Provider  acetaminophen (TYLENOL) 325 MG tablet Take 650 mg by mouth every 6 (six) hours as needed. Every 8 hr for shoulder pain   Yes  Historical Provider, MD  atenolol (TENORMIN) 25 MG tablet Take 1 tablet (25 mg total) by mouth 2 (two) times daily. Reported on 03/25/2015 Patient taking differently: Take 12.5 mg by mouth 2 (two) times daily. Reported on 03/25/2015 07/19/15  Yes Glean Hess, MD  baclofen (LIORESAL) 10 MG tablet Take 1 tablet (10 mg total) by mouth 3 (three) times daily. 07/19/15  Yes Glean Hess, MD  Calcium Carbonate-Vitamin D (CALCIUM + D PO) Take by mouth daily. Reported on 06/07/2015   Yes Historical Provider, MD  estradiol (ESTRACE) 0.5 MG tablet Take 1 tablet (0.5 mg total) by mouth daily. Reported on 06/07/2015 08/21/15  Yes Glean Hess, MD  furosemide (LASIX) 40 MG tablet Take 1 tablet (40 mg total) by mouth daily. 07/19/15  Yes Glean Hess, MD  omeprazole (PRILOSEC) 20 MG capsule Take by mouth.   Yes Historical Provider, MD  spironolactone (ALDACTONE) 100 MG tablet Take 1 tablet (100 mg total) by mouth 2 (two) times daily. Patient taking differently: Take 150 mg by mouth 2 (two) times daily. 1/2 pills 50mg  05/30/15  Yes Lucilla Lame, MD  vitamin E 400 UNIT capsule Take 400 Units by mouth daily.   Yes Historical Provider, MD    Allergies  Allergen Reactions  . Codeine     Past Surgical History  Procedure Laterality Date  . Combined hysterectomy abdominal w/ a&p repair / oophorectomy    . Appendectomy    . Tonsillectomy    . Laparoscopic  cholecystectomy    . Abdominal hysterectomy    . Esophagogastroduodenoscopy (egd) with propofol N/A 06/17/2015    Procedure: ESOPHAGOGASTRODUODENOSCOPY (EGD) WITH PROPOFOL;  Surgeon: Lucilla Lame, MD;  Location: [place];  Service: Endoscopy;  Laterality: N/A;  . Breast biopsy      neg    Social History  Substance Use Topics  . Smoking status: Never Smoker   . Smokeless tobacco: Never Used  . Alcohol Use: No     Medication list has been reviewed and updated.   Physical Exam  Constitutional: She is oriented to person, place, and time. She  appears well-developed. No distress.  HENT:  Head: Normocephalic and atraumatic.  Cardiovascular: Normal rate, regular rhythm and normal heart sounds.   Pulmonary/Chest: Effort normal and breath sounds normal. No respiratory distress.  Genitourinary: No labial fusion. There is rash and tenderness on the right labia. There is rash and tenderness on the left labia. There is erythema and tenderness in the vagina. No bleeding in the vagina.  Musculoskeletal: Normal range of motion.  Tenderness and spasm of right trapezius and posterior cervical muscles  Neurological: She is alert and oriented to person, place, and time.  Skin: Skin is warm and dry. No rash noted.  Psychiatric: She has a normal mood and affect. Her behavior is normal. Thought content normal.  Nursing note and vitals reviewed.   BP 98/68 mmHg  Pulse 84  Resp 16  Ht 5\' 3"  (1.6 m)  Wt 168 lb (76.204 kg)  BMI 29.77 kg/m2  SpO2 98%  Assessment and Plan: 1. Vaginitis and vulvovaginitis May use diaper rash ointment topically for comfort - POCT Wet Prep with KOH - fluconazole (DIFLUCAN) 100 MG tablet; Take 1 tablet (100 mg total) by mouth daily.  Dispense: 2 tablet; Refill: 0 - metroNIDAZOLE (FLAGYL) 500 MG tablet; Take 1 tablet (500 mg total) by mouth 2 (two) times daily.  Dispense: 14 tablet; Refill: 0  2. Nerve root pain Continue tylenol 650 mg tid heat - cyclobenzaprine (FLEXERIL) 10 MG tablet; Take 1 tablet (10 mg total) by mouth at bedtime.  Dispense: 30 tablet; Refill: 0   Halina Maidens, MD [facility]  08/26/2015

## 2015-09-11 ENCOUNTER — Ambulatory Visit (INDEPENDENT_AMBULATORY_CARE_PROVIDER_SITE_OTHER): Payer: Medicare HMO | Admitting: Gastroenterology

## 2015-09-11 ENCOUNTER — Encounter: Payer: Self-pay | Admitting: Gastroenterology

## 2015-09-11 VITALS — BP 120/62 | HR 86 | Temp 98.3°F | Ht 63.0 in | Wt 169.0 lb

## 2015-09-11 DIAGNOSIS — K7460 Unspecified cirrhosis of liver: Secondary | ICD-10-CM | POA: Diagnosis not present

## 2015-09-11 DIAGNOSIS — R188 Other ascites: Principal | ICD-10-CM

## 2015-09-11 NOTE — Patient Instructions (Signed)
You are scheduled for a RUQ abdominal US at [facility] location on July 31st at 11;00am. Please arrive at 10:45am.  Nothing to eat or drink after midnight Sunday night.

## 2015-09-12 ENCOUNTER — Telehealth: Payer: Self-pay

## 2015-09-12 LAB — AFP TUMOR MARKER: AFP-Tumor Marker: 6.9 ng/mL (ref 0.0–8.3)

## 2015-09-12 NOTE — Progress Notes (Signed)
Primary Care Physician: Halina Maidens, MD  Primary Gastroenterologist:  Dr. Lucilla Lame  Chief Complaint  Patient presents with  . Follow up Cirrhosis    HPI: Jenna Franklin is a 75 y.o. female here for follow-up of her cirrhosis. The patient likely has cirrhosis from fatty liver. The patient's liver enzymes have been normal. The patient has been doing well except for some hypotension that her family says she was having. The patient decreased her Aldactone and has been doing much better without reaccumulation of her ascites.  Current Outpatient Prescriptions  Medication Sig Dispense Refill  . acetaminophen (TYLENOL) 325 MG tablet Take 650 mg by mouth every 6 (six) hours as needed. Every 8 hr for shoulder pain    . atenolol (TENORMIN) 25 MG tablet Take 1 tablet (25 mg total) by mouth 2 (two) times daily. Reported on 03/25/2015 (Patient taking differently: Take 12.5 mg by mouth 2 (two) times daily. Reported on 03/25/2015) 180 tablet 3  . Calcium Carbonate-Vitamin D (CALCIUM + D PO) Take by mouth daily. Reported on 06/07/2015    . cyclobenzaprine (FLEXERIL) 10 MG tablet Take 1 tablet (10 mg total) by mouth at bedtime. 30 tablet 0  . estradiol (ESTRACE) 0.5 MG tablet Take 1 tablet (0.5 mg total) by mouth daily. Reported on 06/07/2015 90 tablet 3  . furosemide (LASIX) 40 MG tablet Take 1 tablet (40 mg total) by mouth daily. 90 tablet 3  . omeprazole (PRILOSEC) 20 MG capsule Take by mouth.    . spironolactone (ALDACTONE) 100 MG tablet Take 1 tablet (100 mg total) by mouth 2 (two) times daily. (Patient taking differently: Take 150 mg by mouth 2 (two) times daily. 1/2 pills 50mg ) 60 tablet 4  . vitamin E 400 UNIT capsule Take 400 Units by mouth daily.    . baclofen (LIORESAL) 10 MG tablet Take 1 tablet (10 mg total) by mouth 3 (three) times daily. (Patient not taking: Reported on 09/11/2015) 60 each 0  . fluconazole (DIFLUCAN) 100 MG tablet Take 1 tablet (100 mg total) by mouth daily. (Patient not  taking: Reported on 09/11/2015) 2 tablet 0  . metroNIDAZOLE (FLAGYL) 500 MG tablet Take 1 tablet (500 mg total) by mouth 2 (two) times daily. (Patient not taking: Reported on 09/11/2015) 14 tablet 0   No current facility-administered medications for this visit.     Allergies as of 09/11/2015 - Review Complete 09/11/2015  Allergen Reaction Noted  . Codeine  09/28/2013    ROS:  General: Negative for anorexia, weight loss, fever, chills, fatigue, weakness. ENT: Negative for hoarseness, difficulty swallowing , nasal congestion. CV: Negative for chest pain, angina, palpitations, dyspnea on exertion, peripheral edema.  Respiratory: Negative for dyspnea at rest, dyspnea on exertion, cough, sputum, wheezing.  GI: See history of present illness. GU:  Negative for dysuria, hematuria, urinary incontinence, urinary frequency, nocturnal urination.  Endo: Negative for unusual weight change.    Physical Examination:   BP 120/62   Pulse 86   Temp 98.3 F (36.8 C) (Oral)   Ht 5\' 3"  (1.6 m)   Wt 169 lb (76.7 kg)   BMI 29.94 kg/m   General: Well-nourished, well-developed in no acute distress.  Eyes: No icterus. Conjunctivae pink. Mouth: Oropharyngeal mucosa moist and pink , no lesions erythema or exudate. Lungs: Clear to auscultation bilaterally. Non-labored. Heart: Regular rate and rhythm, no murmurs rubs or gallops.  Abdomen: Bowel sounds are normal, nontender, nondistended, no hepatosplenomegaly or masses, no abdominal bruits or hernia , no rebound  or guarding.   Extremities: No lower extremity edema. No clubbing or deformities. Neuro: Alert and oriented x 3.  Grossly intact. Skin: Warm and dry, no jaundice.   Psych: Alert and cooperative, normal mood and affect.  Labs:    Imaging Studies: No results found.  Assessment and Plan:   Jenna Franklin is a 75 y.o. y/o female with a history of cirrhosis which was likely caused by nonalcoholic steatohepatitis. The patient has been doing  well on a lower dose of Aldactone due to her low blood pressure. The patient will continue this and will also have her blood sent off for alpha-fetoprotein and an ultrasound of her liver to rule out hepatocellular carcinoma. Th patient and her family have been explained the plan and agree with it.   Note: This dictation was prepared with Dragon dictation along with smaller phrase technology. Any transcriptional errors that result from this process are unintentional.

## 2015-09-12 NOTE — Telephone Encounter (Signed)
Pt notified of result

## 2015-09-12 NOTE — Telephone Encounter (Signed)
-----   Message from Lucilla Lame, MD sent at 09/12/2015  7:04 AM EDT ----- Let the patient know the tumor marker was negative.

## 2015-09-16 ENCOUNTER — Ambulatory Visit
Admission: RE | Admit: 2015-09-16 | Discharge: 2015-09-16 | Disposition: A | Discharge status: Home / Self Care | Payer: Medicare HMO | Source: Ambulatory Visit | Attending: Gastroenterology | Admitting: Gastroenterology

## 2015-09-16 DIAGNOSIS — K7460 Unspecified cirrhosis of liver: Secondary | ICD-10-CM | POA: Diagnosis not present

## 2015-09-16 DIAGNOSIS — R188 Other ascites: Secondary | ICD-10-CM

## 2015-09-18 ENCOUNTER — Telehealth: Payer: Self-pay

## 2015-09-18 NOTE — Telephone Encounter (Signed)
Pt notified of US results.  

## 2015-09-18 NOTE — Telephone Encounter (Signed)
-----   Message from Lucilla Lame, MD sent at 09/18/2015  8:12 AM EDT ----- Let the patient know that her ultrasound did not show any signs of an abnormal growth or cancer.

## 2015-10-08 ENCOUNTER — Encounter: Payer: Self-pay | Admitting: Internal Medicine

## 2015-10-08 ENCOUNTER — Ambulatory Visit (INDEPENDENT_AMBULATORY_CARE_PROVIDER_SITE_OTHER): Payer: Medicare HMO | Admitting: Internal Medicine

## 2015-10-08 VITALS — BP 118/76 | HR 79 | Resp 16 | Ht 63.0 in | Wt 170.0 lb

## 2015-10-08 DIAGNOSIS — J019 Acute sinusitis, unspecified: Secondary | ICD-10-CM

## 2015-10-08 DIAGNOSIS — R188 Other ascites: Secondary | ICD-10-CM

## 2015-10-08 DIAGNOSIS — K7460 Unspecified cirrhosis of liver: Secondary | ICD-10-CM | POA: Diagnosis not present

## 2015-10-08 MED ORDER — CEFUROXIME AXETIL 500 MG PO TABS: 500.0000 mg | ORAL_TABLET | Freq: Two times a day (BID) | ORAL | 0 refills | Status: DC

## 2015-10-08 MED ORDER — FLUTICASONE PROPIONATE 50 MCG/ACT NA SUSP: 2.0000 | Freq: Every day | NASAL | 2 refills | Status: DC

## 2015-10-08 NOTE — Patient Instructions (Signed)
CLINICAL DATA:  Hepatic cirrhosis with ascites, previous cholecystectomy. EXAM: US ABDOMEN LIMITED - RIGHT UPPER QUADRANT COMPARISON:  Abdominal ultrasound of April 01, 2015 and abdominal MRI of April 19, 2015. FINDINGS: Gallbladder: The gallbladder is surgically absent. Common bile duct: Diameter: 4.7 mm. Liver: The hepatic echotexture is heterogeneous. The surface contour is nodular. No discrete mass or ductal dilation is observed. No ascites surrounding the liver is demonstrated today. IMPRESSION: Cirrhotic changes within the liver. No hepatic mass is observed. No cirrhosis observed today in the right upper quadrant. Electronically Signed   By: David  Martinique M.D.   On: 09/16/2015 11:57

## 2015-10-08 NOTE — Progress Notes (Signed)
Date:  10/08/2015   Name:  Jenna Franklin   DOB:  05-Nov-1940   MRN:  [id number]   Chief Complaint: Sinus Problem Sinus Problem  This is a new problem. The current episode started in the past 7 days. The problem has been gradually worsening since onset. There has been no fever. The pain is mild. Associated symptoms include congestion, coughing, ear pain, headaches, sinus pressure and a sore throat. Pertinent negatives include no chills, diaphoresis or shortness of breath. Past treatments include oral decongestants. The treatment provided mild relief.      Review of Systems  Constitutional: Negative for chills, diaphoresis and fever.  HENT: Positive for congestion, ear pain, sinus pressure and sore throat.   Eyes: Negative for visual disturbance.  Respiratory: Positive for cough. Negative for shortness of breath and wheezing.   Cardiovascular: Negative for chest pain and palpitations.  Gastrointestinal: Negative for abdominal pain.  Neurological: Positive for headaches.    Patient Active Problem List   Diagnosis Date Noted  . Esophageal varices in cirrhosis (HCC)   . Cirrhosis of liver with ascites ([place]) 05/29/2015  . Non-alcoholic fatty liver disease 05/29/2015  . Post-menopausal atrophic vaginitis 05/29/2015  . Anxiety 03/25/2015  . Acid reflux 03/25/2015  . Calculus of kidney 03/25/2015  . Fast heart beat 03/25/2015  . Impaired glucose tolerance 09/20/2014  . Adjustment disorder with anxious mood 10/05/2013  . Physical deconditioning 10/05/2013  . Nerve root pain 12/11/2011  . Bursitis, trochanteric 12/11/2011    Prior to Admission medications   Medication Sig Start Date End Date Taking? Authorizing Provider  acetaminophen (TYLENOL) 325 MG tablet Take 650 mg by mouth every 6 (six) hours as needed. Every 8 hr for shoulder pain   Yes Historical Provider, MD  atenolol (TENORMIN) 25 MG tablet Take 1 tablet (25 mg total) by mouth 2 (two) times daily. Reported on  03/25/2015 Patient taking differently: Take 12.5 mg by mouth 2 (two) times daily. Reported on 03/25/2015 07/19/15  Yes Glean Hess, MD  Calcium Carbonate-Vitamin D (CALCIUM + D PO) Take by mouth daily. Reported on 06/07/2015   Yes Historical Provider, MD  cyclobenzaprine (FLEXERIL) 10 MG tablet Take 1 tablet (10 mg total) by mouth at bedtime. 08/26/15  Yes Glean Hess, MD  estradiol (ESTRACE) 0.5 MG tablet Take 1 tablet (0.5 mg total) by mouth daily. Reported on 06/07/2015 08/21/15  Yes Glean Hess, MD  furosemide (LASIX) 40 MG tablet Take 1 tablet (40 mg total) by mouth daily. 07/19/15  Yes Glean Hess, MD  omeprazole (PRILOSEC) 20 MG capsule Take by mouth.   Yes Historical Provider, MD  spironolactone (ALDACTONE) 100 MG tablet Take 1 tablet (100 mg total) by mouth 2 (two) times daily. Patient taking differently: Take 150 mg by mouth 2 (two) times daily. 1/2 pills 50mg  05/30/15  Yes Lucilla Lame, MD  vitamin E 400 UNIT capsule Take 400 Units by mouth daily.   Yes Historical Provider, MD    Allergies  Allergen Reactions  . Codeine     Past Surgical History:  Procedure Laterality Date  . ABDOMINAL HYSTERECTOMY    . APPENDECTOMY    . BREAST BIOPSY     neg  . COMBINED HYSTERECTOMY ABDOMINAL W/ A&P REPAIR / OOPHORECTOMY    . ESOPHAGOGASTRODUODENOSCOPY (EGD) WITH PROPOFOL N/A 06/17/2015   Procedure: ESOPHAGOGASTRODUODENOSCOPY (EGD) WITH PROPOFOL;  Surgeon: Lucilla Lame, MD;  Location: [place];  Service: Endoscopy;  Laterality: N/A;  . LAPAROSCOPIC CHOLECYSTECTOMY    .  TONSILLECTOMY      Social History  Substance Use Topics  . Smoking status: Never Smoker  . Smokeless tobacco: Never Used  . Alcohol use No     Medication list has been reviewed and updated.   Physical Exam  Constitutional: She is oriented to person, place, and time. She appears well-developed and well-nourished.  HENT:  Right Ear: External ear and ear canal normal. Tympanic membrane is not  erythematous and not retracted.  Left Ear: External ear and ear canal normal. Tympanic membrane is retracted. Tympanic membrane is not erythematous. A middle ear effusion is present.  Nose: Right sinus exhibits maxillary sinus tenderness and frontal sinus tenderness. Left sinus exhibits maxillary sinus tenderness and frontal sinus tenderness.  Mouth/Throat: Uvula is midline and mucous membranes are normal. No oral lesions. No oropharyngeal exudate or posterior oropharyngeal erythema.  Cardiovascular: Normal rate, regular rhythm and normal heart sounds.   Pulmonary/Chest: Breath sounds normal. She has no wheezes. She has no rales.  Lymphadenopathy:    She has no cervical adenopathy.  Neurological: She is alert and oriented to person, place, and time.    BP 118/76 (BP Location: Right Arm, Patient Position: Sitting, Cuff Size: Normal)   Pulse 79   Resp 16   Ht 5\' 3"  (1.6 m)   Wt 170 lb (77.1 kg)   SpO2 97%   BMI 30.11 kg/m   Assessment and Plan: 1. Acute sinusitis, recurrence not specified, unspecified location Continue Mucinex - fluticasone (FLONASE) 50 MCG/ACT nasal spray; Place 2 sprays into both nostrils daily.  Dispense: 16 g; Refill: 2 - cefUROXime (CEFTIN) 500 MG tablet; Take 1 tablet (500 mg total) by mouth 2 (two) times daily with a meal.  Dispense: 20 tablet; Refill: 0  2. Cirrhosis of liver with ascites, unspecified hepatic cirrhosis type ([place]) Stable - recent US showed no ascites or mass AFP was normal   Halina Maidens, MD [facility]  10/08/2015

## 2015-11-19 ENCOUNTER — Encounter: Payer: Self-pay | Admitting: Internal Medicine

## 2015-11-19 ENCOUNTER — Ambulatory Visit (INDEPENDENT_AMBULATORY_CARE_PROVIDER_SITE_OTHER): Payer: Medicare HMO | Admitting: Internal Medicine

## 2015-11-19 VITALS — BP 120/82 | HR 84 | Temp 98.5°F | Resp 16 | Ht 63.0 in | Wt 171.0 lb

## 2015-11-19 DIAGNOSIS — J0100 Acute maxillary sinusitis, unspecified: Secondary | ICD-10-CM | POA: Diagnosis not present

## 2015-11-19 DIAGNOSIS — M5410 Radiculopathy, site unspecified: Secondary | ICD-10-CM

## 2015-11-19 DIAGNOSIS — K7460 Unspecified cirrhosis of liver: Secondary | ICD-10-CM

## 2015-11-19 DIAGNOSIS — R188 Other ascites: Secondary | ICD-10-CM

## 2015-11-19 MED ORDER — CYCLOBENZAPRINE HCL 10 MG PO TABS
10.0000 mg | ORAL_TABLET | Freq: Every day | ORAL | 0 refills | Status: DC
Start: 2015-11-19 — End: 2016-05-19

## 2015-11-19 MED ORDER — METHYLPREDNISOLONE 4 MG PO TBPK: ORAL_TABLET | ORAL | 0 refills | Status: DC

## 2015-11-19 MED ORDER — AZITHROMYCIN 250 MG PO TABS: ORAL_TABLET | ORAL | 0 refills | Status: DC

## 2015-11-19 NOTE — Progress Notes (Signed)
Date:  11/19/2015   Name:  Jenna Franklin   DOB:  1940/06/04   MRN:  [id number]   Chief Complaint: Sinus Problem (Headache and dizzy. Coughing up metalic tasting phlem. ) and Dizziness  Sinus Problem  This is a new problem. The current episode started in the past 7 days. The problem has been gradually worsening since onset. There has been no fever. Associated symptoms include congestion, coughing and sinus pressure. Pertinent negatives include no chills, headaches or shortness of breath.  Dizziness  Associated symptoms include arthralgias, congestion, coughing and myalgias. Pertinent negatives include no chest pain, chills, fatigue, fever or headaches.  Unable to get to ENT in [place] because she does not have a driver. Cirrhosis - she started to retain more fluid in her ankles about 10 days ago so increased spironolactone.  She is feeling better and plans to adjust this dose as needed to control edema.  She is also due for recheck of renal function.  Review of Systems  Constitutional: Negative for chills, fatigue and fever.  HENT: Positive for congestion, postnasal drip, rhinorrhea and sinus pressure.   Respiratory: Positive for cough and chest tightness. Negative for shortness of breath and wheezing.   Cardiovascular: Negative for chest pain, palpitations and leg swelling.  Musculoskeletal: Positive for arthralgias and myalgias.  Skin: Negative for wound.  Neurological: Positive for dizziness. Negative for seizures, syncope and headaches.  Hematological: Negative for adenopathy. Bruises/bleeds easily.  Psychiatric/Behavioral: Positive for dysphoric mood. The patient is nervous/anxious.     Patient Active Problem List   Diagnosis Date Noted  . Esophageal varices in cirrhosis (HCC)   . Cirrhosis of liver with ascites ([place]) 05/29/2015  . Non-alcoholic fatty liver disease 05/29/2015  . Post-menopausal atrophic vaginitis 05/29/2015  . Anxiety 03/25/2015  . Acid reflux  03/25/2015  . Calculus of kidney 03/25/2015  . Fast heart beat 03/25/2015  . Impaired glucose tolerance 09/20/2014  . Adjustment disorder with anxious mood 10/05/2013  . Physical deconditioning 10/05/2013  . Nerve root pain 12/11/2011  . Bursitis, trochanteric 12/11/2011    Prior to Admission medications   Medication Sig Start Date End Date Taking? Authorizing Provider  acetaminophen (TYLENOL) 325 MG tablet Take 650 mg by mouth every 6 (six) hours as needed. Every 8 hr for shoulder pain   Yes Historical Provider, MD  atenolol (TENORMIN) 25 MG tablet Take 1 tablet (25 mg total) by mouth 2 (two) times daily. Reported on 03/25/2015 Patient taking differently: Take 12.5 mg by mouth 2 (two) times daily. Reported on 03/25/2015 07/19/15  Yes Glean Hess, MD  Calcium Carbonate-Vitamin D (CALCIUM + D PO) Take by mouth daily. Reported on 06/07/2015   Yes Historical Provider, MD  estradiol (ESTRACE) 0.5 MG tablet Take 1 tablet (0.5 mg total) by mouth daily. Reported on 06/07/2015 08/21/15  Yes Glean Hess, MD  fluticasone [facility]) 50 MCG/ACT nasal spray Place 2 sprays into both nostrils daily. 10/08/15  Yes Glean Hess, MD  furosemide (LASIX) 40 MG tablet Take 1 tablet (40 mg total) by mouth daily. 07/19/15  Yes Glean Hess, MD  spironolactone (ALDACTONE) 100 MG tablet Take 1 tablet (100 mg total) by mouth 2 (two) times daily. Patient taking differently: Take 100 mg by mouth daily.  05/30/15  Yes Lucilla Lame, MD  vitamin E 400 UNIT capsule Take 400 Units by mouth daily.   Yes Historical Provider, MD    Allergies  Allergen Reactions  . Codeine     Past  Surgical History:  Procedure Laterality Date  . ABDOMINAL HYSTERECTOMY    . APPENDECTOMY    . BREAST BIOPSY     neg  . COMBINED HYSTERECTOMY ABDOMINAL W/ A&P REPAIR / OOPHORECTOMY    . ESOPHAGOGASTRODUODENOSCOPY (EGD) WITH PROPOFOL N/A 06/17/2015   Procedure: ESOPHAGOGASTRODUODENOSCOPY (EGD) WITH PROPOFOL;  Surgeon: Lucilla Lame, MD;   Location: [place];  Service: Endoscopy;  Laterality: N/A;  . LAPAROSCOPIC CHOLECYSTECTOMY    . TONSILLECTOMY      Social History  Substance Use Topics  . Smoking status: Never Smoker  . Smokeless tobacco: Never Used  . Alcohol use No     Medication list has been reviewed and updated.   Physical Exam  Constitutional: She is oriented to person, place, and time. She appears well-developed and well-nourished.  HENT:  Right Ear: External ear and ear canal normal. Tympanic membrane is not erythematous and not retracted.  Left Ear: External ear and ear canal normal. Tympanic membrane is not erythematous and not retracted.  Nose: Right sinus exhibits maxillary sinus tenderness and frontal sinus tenderness. Left sinus exhibits maxillary sinus tenderness and frontal sinus tenderness.  Mouth/Throat: Uvula is midline and mucous membranes are normal. No oral lesions. Posterior oropharyngeal erythema present. No oropharyngeal exudate.  Neck: Normal range of motion. Neck supple.  Cardiovascular: Normal rate, regular rhythm and normal heart sounds.   Pulmonary/Chest: Breath sounds normal. She has no wheezes. She has no rales.  Musculoskeletal: She exhibits no edema or tenderness.  Lymphadenopathy:    She has no cervical adenopathy.  Neurological: She is alert and oriented to person, place, and time.  Psychiatric: Her speech is normal. She exhibits a depressed mood.    BP 120/82   Pulse 84   Temp 98.5 F (36.9 C) (Oral)   Resp 16   Ht 5\' 3"  (1.6 m)   Wt 171 lb (77.6 kg)   SpO2 100%   BMI 30.29 kg/m   Assessment and Plan: 1. Acute maxillary sinusitis, recurrence not specified Continue Flonase NS - azithromycin (ZITHROMAX) 250 MG tablet; Take 2 tabs on day #1 the 1 tab per day for 10 more days.  Dispense: 12 tablet; Refill: 0 - methylPREDNISolone (MEDROL DOSEPAK) 4 MG TBPK tablet; Take 6 pills on day 1 the 5 pills day 2 then 4 pills day 3 then 3 pills day 4 then 2 pills day 5  then one pills day 6 then stop  Dispense: 21 tablet; Refill: 0  2. Nerve root pain - cyclobenzaprine (FLEXERIL) 10 MG tablet; Take 1 tablet (10 mg total) by mouth at bedtime.  Dispense: 30 tablet; Refill: 0  3. Cirrhosis of liver with ascites, unspecified hepatic cirrhosis type ([place]) Continue current therapy with goal weight 170 lbs. - Comprehensive metabolic panel   Halina Maidens, MD [facility]  11/19/2015

## 2015-11-20 LAB — COMPREHENSIVE METABOLIC PANEL
A/G RATIO: 1.5 (ref 1.2–2.2)
ALT: 23 IU/L (ref 0–32)
AST: 32 IU/L (ref 0–40)
Albumin: 4.3 g/dL (ref 3.5–4.8)
Alkaline Phosphatase: 92 IU/L (ref 39–117)
BILIRUBIN TOTAL: 1 mg/dL (ref 0.0–1.2)
BUN / CREAT RATIO: 20 (ref 12–28)
BUN: 22 mg/dL (ref 8–27)
CO2: 22 mmol/L (ref 18–29)
CREATININE: 1.1 mg/dL — AB (ref 0.57–1.00)
Calcium: 9.6 mg/dL (ref 8.7–10.3)
Chloride: 100 mmol/L (ref 96–106)
GFR calc Af Amer: 57 mL/min/{1.73_m2} — ABNORMAL LOW (ref >59)
GFR calc non Af Amer: 49 mL/min/{1.73_m2} — ABNORMAL LOW (ref >59)
GLUCOSE: 122 mg/dL — AB (ref 65–99)
Globulin, Total: 2.9 g/dL (ref 1.5–4.5)
Potassium: 4.2 mmol/L (ref 3.5–5.2)
Sodium: 139 mmol/L (ref 134–144)
Total Protein: 7.2 g/dL (ref 6.0–8.5)

## 2015-11-21 ENCOUNTER — Telehealth: Payer: Self-pay

## 2015-11-21 ENCOUNTER — Other Ambulatory Visit: Payer: Self-pay

## 2015-11-21 NOTE — Telephone Encounter (Signed)
Prescribed prednisone and having reaction of nervousness and irritation. Crying and very upset.Can not take this medicine. Advised to stop the steroid and get rest and then Dr. Army Melia can call back and discuss more of this tomorrow. Call Manuela Schwartz [phone].

## 2015-11-22 NOTE — Telephone Encounter (Signed)
Stop the prednisone but finish all the antibiotics.  Re-assess next week and return OV if needed.

## 2015-12-04 DIAGNOSIS — H8110 Benign paroxysmal vertigo, unspecified ear: Secondary | ICD-10-CM | POA: Diagnosis not present

## 2015-12-04 DIAGNOSIS — H8112 Benign paroxysmal vertigo, left ear: Secondary | ICD-10-CM | POA: Diagnosis not present

## 2015-12-23 DIAGNOSIS — Z1283 Encounter for screening for malignant neoplasm of skin: Secondary | ICD-10-CM | POA: Diagnosis not present

## 2015-12-23 DIAGNOSIS — Z872 Personal history of diseases of the skin and subcutaneous tissue: Secondary | ICD-10-CM | POA: Diagnosis not present

## 2015-12-23 DIAGNOSIS — Z09 Encounter for follow-up examination after completed treatment for conditions other than malignant neoplasm: Secondary | ICD-10-CM | POA: Diagnosis not present

## 2015-12-23 DIAGNOSIS — L918 Other hypertrophic disorders of the skin: Secondary | ICD-10-CM | POA: Diagnosis not present

## 2015-12-23 DIAGNOSIS — L821 Other seborrheic keratosis: Secondary | ICD-10-CM | POA: Diagnosis not present

## 2016-01-27 ENCOUNTER — Ambulatory Visit: Payer: Medicare HMO | Admitting: Internal Medicine

## 2016-02-18 ENCOUNTER — Encounter: Payer: Self-pay | Admitting: Internal Medicine

## 2016-02-18 ENCOUNTER — Ambulatory Visit (INDEPENDENT_AMBULATORY_CARE_PROVIDER_SITE_OTHER): Payer: Medicare HMO | Admitting: Internal Medicine

## 2016-02-18 VITALS — BP 116/78 | HR 86 | Temp 97.8°F | Ht 63.0 in | Wt 168.0 lb

## 2016-02-18 DIAGNOSIS — Z23 Encounter for immunization: Secondary | ICD-10-CM

## 2016-02-18 DIAGNOSIS — R300 Dysuria: Secondary | ICD-10-CM | POA: Diagnosis not present

## 2016-02-18 LAB — POC URINALYSIS WITH MICROSCOPIC (NON AUTO)MANUAL RESULT
BILIRUBIN UA: NEGATIVE
Crystals: 0
EPITHELIAL CELLS, URINE PER MICROSCOPY: 0
GLUCOSE UA: NEGATIVE
Ketones, UA: NEGATIVE
Leukocytes, UA: NEGATIVE
Mucus, UA: 0
NITRITE UA: NEGATIVE
Protein, UA: NEGATIVE
RBC: 2 M/uL — AB (ref 4.04–5.48)
Spec Grav, UA: <=1.005
Urobilinogen, UA: 0.2
WBC Casts, UA: 3
pH, UA: 5

## 2016-02-18 MED ORDER — CIPROFLOXACIN HCL 250 MG PO TABS: 250.0000 mg | ORAL_TABLET | Freq: Two times a day (BID) | ORAL | 0 refills | Status: DC

## 2016-02-18 NOTE — Progress Notes (Signed)
Date:  02/18/2016   Name:  Jenna Franklin   DOB:  04/10/1940   MRN:  [id number]   Chief Complaint: Urinary Tract Infection Urinary Tract Infection   This is a new problem. The current episode started in the past 7 days. The problem occurs every urination. The quality of the pain is described as aching. The pain is mild. There has been no fever. There is a history of pyelonephritis. Associated symptoms include flank pain, frequency and urgency. Pertinent negatives include no chills, discharge, hematuria, nausea or vomiting. She has tried nothing for the symptoms.    Review of Systems  Constitutional: Negative for chills.  Respiratory: Negative for shortness of breath and wheezing.   Cardiovascular: Negative for chest pain.  Gastrointestinal: Positive for abdominal pain. Negative for blood in stool, nausea and vomiting.  Genitourinary: Positive for flank pain, frequency, pelvic pain and urgency. Negative for difficulty urinating and hematuria.    Patient Active Problem List   Diagnosis Date Noted  . Esophageal varices in cirrhosis (HCC)   . Cirrhosis of liver with ascites ([place]) 05/29/2015  . Non-alcoholic fatty liver disease 05/29/2015  . Post-menopausal atrophic vaginitis 05/29/2015  . Anxiety 03/25/2015  . Acid reflux 03/25/2015  . Calculus of kidney 03/25/2015  . Fast heart beat 03/25/2015  . Impaired glucose tolerance 09/20/2014  . Adjustment disorder with anxious mood 10/05/2013  . Physical deconditioning 10/05/2013  . Nerve root pain 12/11/2011  . Bursitis, trochanteric 12/11/2011    Prior to Admission medications   Medication Sig Start Date End Date Taking? Authorizing Provider  acetaminophen (TYLENOL) 325 MG tablet Take 650 mg by mouth every 6 (six) hours as needed. Every 8 hr for shoulder pain   Yes Historical Provider, MD  atenolol (TENORMIN) 25 MG tablet Take 1 tablet (25 mg total) by mouth 2 (two) times daily. Reported on 03/25/2015 Patient taking differently:  Take 12.5 mg by mouth 2 (two) times daily. Reported on 03/25/2015 07/19/15  Yes Glean Hess, MD  azithromycin (ZITHROMAX) 250 MG tablet Take 2 tabs on day #1 the 1 tab per day for 10 more days. 11/19/15  Yes Glean Hess, MD  Calcium Carbonate-Vitamin D (CALCIUM + D PO) Take by mouth daily. Reported on 06/07/2015   Yes Historical Provider, MD  cyclobenzaprine (FLEXERIL) 10 MG tablet Take 1 tablet (10 mg total) by mouth at bedtime. 11/19/15  Yes Glean Hess, MD  estradiol (ESTRACE) 0.5 MG tablet Take 1 tablet (0.5 mg total) by mouth daily. Reported on 06/07/2015 08/21/15  Yes Glean Hess, MD  fluticasone [facility]) 50 MCG/ACT nasal spray Place 2 sprays into both nostrils daily. 10/08/15  Yes Glean Hess, MD  furosemide (LASIX) 40 MG tablet Take 1 tablet (40 mg total) by mouth daily. 07/19/15  Yes Glean Hess, MD  spironolactone (ALDACTONE) 100 MG tablet Take 1 tablet (100 mg total) by mouth 2 (two) times daily. Patient taking differently: Take 100 mg by mouth daily.  05/30/15  Yes Lucilla Lame, MD  vitamin E 400 UNIT capsule Take 400 Units by mouth daily.   Yes Historical Provider, MD    Allergies  Allergen Reactions  . Codeine     Past Surgical History:  Procedure Laterality Date  . ABDOMINAL HYSTERECTOMY    . APPENDECTOMY    . BREAST BIOPSY     neg  . COMBINED HYSTERECTOMY ABDOMINAL W/ A&P REPAIR / OOPHORECTOMY    . ESOPHAGOGASTRODUODENOSCOPY (EGD) WITH PROPOFOL N/A 06/17/2015   Procedure:  ESOPHAGOGASTRODUODENOSCOPY (EGD) WITH PROPOFOL;  Surgeon: Lucilla Lame, MD;  Location: [place];  Service: Endoscopy;  Laterality: N/A;  . LAPAROSCOPIC CHOLECYSTECTOMY    . TONSILLECTOMY      Social History  Substance Use Topics  . Smoking status: Never Smoker  . Smokeless tobacco: Never Used  . Alcohol use No     Medication list has been reviewed and updated.   Physical Exam  Constitutional: She is oriented to person, place, and time. She appears well-developed and  well-nourished.  Cardiovascular: Normal rate, regular rhythm and normal heart sounds.   Pulmonary/Chest: Effort normal and breath sounds normal. No respiratory distress.  Abdominal: Soft. Bowel sounds are normal. There is tenderness in the suprapubic area. There is no rebound, no guarding and no CVA tenderness.  Neurological: She is alert and oriented to person, place, and time.  Psychiatric: She has a normal mood and affect.  Nursing note and vitals reviewed.   BP 116/78   Pulse 86   Temp 97.8 F (36.6 C)   Ht 5\' 3"  (1.6 m)   Wt 168 lb (76.2 kg)   SpO2 98%   BMI 29.76 kg/m   Assessment and Plan: 1. Encounter for immunization - Flu Vaccine QUAD 36+ mos IM - POC urinalysis w microscopic (non auto)  2. Dysuria Continue fluids - POC urinalysis w microscopic (non auto) - ciprofloxacin (CIPRO) 250 MG tablet; Take 1 tablet (250 mg total) by mouth 2 (two) times daily.  Dispense: 6 tablet; Refill: 0   Halina Maidens, MD [facility]  02/18/2016

## 2016-04-07 ENCOUNTER — Other Ambulatory Visit: Payer: Self-pay | Admitting: Internal Medicine

## 2016-04-07 DIAGNOSIS — K7460 Unspecified cirrhosis of liver: Secondary | ICD-10-CM

## 2016-04-07 DIAGNOSIS — R188 Other ascites: Principal | ICD-10-CM

## 2016-04-20 DIAGNOSIS — M7061 Trochanteric bursitis, right hip: Secondary | ICD-10-CM | POA: Diagnosis not present

## 2016-04-20 DIAGNOSIS — M1611 Unilateral primary osteoarthritis, right hip: Secondary | ICD-10-CM | POA: Diagnosis not present

## 2016-05-16 ENCOUNTER — Ambulatory Visit
Admission: EM | Admit: 2016-05-16 | Discharge: 2016-05-16 | Disposition: A | Discharge status: Home / Self Care | Payer: Medicare HMO | Attending: Physician Assistant | Admitting: Physician Assistant

## 2016-05-16 ENCOUNTER — Encounter: Payer: Self-pay | Admitting: Emergency Medicine

## 2016-05-16 DIAGNOSIS — H9202 Otalgia, left ear: Secondary | ICD-10-CM

## 2016-05-16 DIAGNOSIS — H6503 Acute serous otitis media, bilateral: Secondary | ICD-10-CM

## 2016-05-16 MED ORDER — AZITHROMYCIN 1 G PO PACK: 1.0000 g | PACK | Freq: Once | ORAL | 0 refills | Status: AC

## 2016-05-16 NOTE — Discharge Instructions (Signed)
-  Complete antibiotic as directed -May use over the counter acetaminophen regular strength for pain.  -Continue Mucinex  -Start taking an over the counter nasal spray like Afrin or Flonase to help decrease the inflammation and clear the fluid from your ears. Direct the spray up and angled slightly toward the ears. -return to the clinic or PCP/ENT if your symptoms do not improve or if they worsen.

## 2016-05-16 NOTE — ED Triage Notes (Addendum)
Sharp pain behind ear that radiates to her head for 3 days.

## 2016-05-16 NOTE — ED Provider Notes (Signed)
[facility] ____________________________________________  Time seen: Approximately 9:43 AM  I have reviewed the triage vital signs and the nursing notes.   HISTORY  Chief Complaint Muscle Pain   HPI Jenna Franklin is a 76 y.o. female with past history of CHF, non-alcoholic fatty liver disease with cirrhosis, reflux, GERD, asthma, and anemia who presents with pain behind her right ear. Patient reports a similar pain before several years ago that was treated by her ENT with steroids but states the steroids made her jittery. Patient describes the pain as being behind her right ear with pain shooting up the back of her head every so often. Patient reports his pain has been going on for about 3 days, causing her difficulty sleeping. She reports having taken ibuprofen with no relief. Patient also reports a raspy voice as well as sick fluid drainage she has been taking Mucinex for. She will believes the phlegm to be allergy related. Patient also reports a dull headache. 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 She does report some minimal neck pain with head and neck movement.  Denies chest pain, shortness of breath, abdominal pain, dysuria, extremity pain, extremity swelling or rash.    Past Medical History:  Diagnosis Date  . Anemia   . Asthma   . Bronchitis    currently  . CHF (congestive heart failure) ([place])    pt says she thinks she has this  . Cirrhosis of liver ([place])   . GERD (gastroesophageal reflux disease)   . H/O: depression   . Hypertension   . Non-alcoholic fatty liver disease   . Nonalcoholic fatty liver disease   . Osteopenia   . Osteopenia   . Reflux   . Reflux   . Shortness of breath dyspnea   . Vitamin D deficiency   . Vitamin D deficiency     Patient Active Problem List   Diagnosis Date Noted  . Esophageal varices in cirrhosis (HCC)    . Cirrhosis of liver with ascites ([place]) 05/29/2015  . Non-alcoholic fatty liver disease 05/29/2015  . Post-menopausal atrophic vaginitis 05/29/2015  . Anxiety 03/25/2015  . Acid reflux 03/25/2015  . Calculus of kidney 03/25/2015  . Fast heart beat 03/25/2015  . Impaired glucose tolerance 09/20/2014  . Adjustment disorder with anxious mood 10/05/2013  . Physical deconditioning 10/05/2013  . Nerve root pain 12/11/2011  . Bursitis, trochanteric 12/11/2011    Past Surgical History:  Procedure Laterality Date  . ABDOMINAL HYSTERECTOMY    . APPENDECTOMY    . BREAST BIOPSY     neg  . COMBINED HYSTERECTOMY ABDOMINAL W/ A&P REPAIR / OOPHORECTOMY    . ESOPHAGOGASTRODUODENOSCOPY (EGD) WITH PROPOFOL N/A 06/17/2015   Procedure: ESOPHAGOGASTRODUODENOSCOPY (EGD) WITH PROPOFOL;  Surgeon: Lucilla Lame, MD;  Location: [place];  Service: Endoscopy;  Laterality: N/A;  . LAPAROSCOPIC CHOLECYSTECTOMY    . TONSILLECTOMY       No current facility-administered medications for this encounter.   Current Outpatient Prescriptions:  .  acetaminophen (TYLENOL) 325 MG tablet, Take 650 mg by mouth every 6 (six) hours as needed. Every 8 hr for shoulder pain, Disp: , Rfl:  .  atenolol (TENORMIN) 25 MG tablet, Take 1 tablet (25 mg total) by mouth 2 (two) times daily. Reported on 03/25/2015 (Patient taking differently: Take 12.5 mg by mouth 2 (two) times daily. Reported on 03/25/2015), Disp: 180 tablet, Rfl:  3 .  Calcium Carbonate-Vitamin D (CALCIUM + D PO), Take by mouth daily. Reported on 06/07/2015, Disp: , Rfl:  .  cyclobenzaprine (FLEXERIL) 10 MG tablet, Take 1 tablet (10 mg total) by mouth at bedtime., Disp: 30 tablet, Rfl: 0 .  estradiol (ESTRACE) 0.5 MG tablet, Take 1 tablet (0.5 mg total) by mouth daily. Reported on 06/07/2015, Disp: 90 tablet, Rfl: 3 .  fluticasone (FLONASE) 50 MCG/ACT nasal spray, Place 2 sprays into both nostrils daily., Disp: 16 g, Rfl: 2 .  furosemide (LASIX) 40 MG tablet, TAKE  (1) TABLET BY MOUTH EVERY DAY, Disp: 90 tablet, Rfl: 0 .  spironolactone (ALDACTONE) 100 MG tablet, Take 1 tablet (100 mg total) by mouth 2 (two) times daily. (Patient taking differently: Take 100 mg by mouth daily. ), Disp: 60 tablet, Rfl: 4 .  vitamin E 400 UNIT capsule, Take 400 Units by mouth daily., Disp: , Rfl:  .  azithromycin (ZITHROMAX) 1 g powder, Take 1 packet (1 g total) by mouth once., Disp: 1 each, Rfl: 0 .  ciprofloxacin (CIPRO) 250 MG tablet, Take 1 tablet (250 mg total) by mouth 2 (two) times daily., Disp: 6 tablet, Rfl: 0  Allergies Baclofen and Codeine  Family History  Problem Relation Age of Onset  . Heart disease Mother   . Heart disease Father   . Heart attack Paternal Grandfather   . Asthma Son 31  . Breast cancer Neg Hx     Social History Social History  Substance Use Topics  . Smoking status: Never Smoker  . Smokeless tobacco: Never Used  . Alcohol use No    Review of Systems As noted above in history of present illness, 10-point ROS otherwise negative.  ____________________________________________   PHYSICAL EXAM:  VITAL SIGNS: ED Triage Vitals  Enc Vitals Group     BP 05/16/16 0900 131/66     Pulse Rate 05/16/16 0900 89     Resp 05/16/16 0900 16     Temp 05/16/16 0900 97.9 F (36.6 C)     Temp Source 05/16/16 0900 Tympanic     SpO2 05/16/16 0900 98 %     Weight 05/16/16 0901 168 lb (76.2 kg)     Height 05/16/16 0901 5\' 3"  (1.6 m)     Head Circumference --      Peak Flow --      Pain Score 05/16/16 0901 10     Pain Loc --      Pain Edu? --      Excl. in [place]? --     Constitutional: Alert and oriented. Well appearing and in no acute distress. Eyes: Conjunctivae are normal. PERRL. EOMI. ENT      Head: Normocephalic and atraumatic. Palpation tenderness behind the right ear      Ears: Fluid with air bubbles noted behind both eardrums      Nose: No congestion/rhinnorhea. No sinus pain     Mouth/Throat: Mucous membranes are  moist.Oropharynx non-erythematous. Thick clear drainage to back of throat Neck: No stridor. Right cervical nodes tender to palpation. No pain or tenderness to movement/rotation at her neck.  Cardiovascular: Normal rate, regular rhythm. Grossly normal heart sounds.  Good peripheral circulation. Respiratory: Normal respiratory effort without tachypnea nor retractions. Breath sounds are clear and equal bilaterally. No wheezes, rales, rhonchi. Gastrointestinal: Soft and nontender. No distention. Normal Bowel sounds. No CVA tenderness. Musculoskeletal:  Nontender with normal range of motion in all extremities.  Neurologic:  Normal speech and language. No gross focal neurologic  deficits are appreciated. Speech is normal. No gait instability.  Skin:  Skin is warm, dry and intact. No rash noted. Psychiatric: Mood and affect are normal. Speech and behavior are normal. Patient exhibits appropriate insight and judgment   ___________________________________________   LABS (all labs ordered are listed, but only abnormal results are displayed)  Labs Reviewed - No data to display ____________________________________________  PROCEDURES Procedures   Procedure(s) performed: None   ____________________________________________   INITIAL IMPRESSION / ASSESSMENT AND PLAN / ED COURSE  Pertinent labs & imaging results that were available during my care of the patient were reviewed by me and considered in my medical decision making (see chart for details).  ____________________________________________   FINAL CLINICAL IMPRESSION(S) / ED DIAGNOSES  Final diagnoses:  Bilateral acute serous otitis media, recurrence not specified  Ear pain, left     Discharge Medication List as of 05/16/2016 10:09 AM    START taking these medications   Details  Azithromycin 250 mg tablet 2 tablets day one followed by 1 tablet daily next 4 days. Starting Sat 05/16/2016, Normal       Patient with serous otitis  media with associated mastoid pain and cervical lymph node tenderness. Well prescribed a course of azithromycin and recommend patient continue Mucinex. Also recommend patient take an over-the-counter nasal spray like Afrin and Flonase to help decrease the inflammation and fluid in her ears. Patient to return to clinic or her primary care physician or ENT should her symptoms not improve or if they worsen.  Discussed follow up with Primary care physician this week. Discussed follow up and return parameters including no resolution or any worsening concerns. Patient verbalized understanding and agreed to plan.  Note: This dictation was prepared with Dragon dictation along with smaller phrase technology. Any transcriptional errors that result from this process are unintentional.         Luvenia Redden, PA-C 05/16/16 1413

## 2016-05-19 ENCOUNTER — Other Ambulatory Visit: Payer: Self-pay | Admitting: Internal Medicine

## 2016-05-19 ENCOUNTER — Ambulatory Visit (INDEPENDENT_AMBULATORY_CARE_PROVIDER_SITE_OTHER): Payer: Medicare HMO | Admitting: Internal Medicine

## 2016-05-19 ENCOUNTER — Encounter: Payer: Self-pay | Admitting: Internal Medicine

## 2016-05-19 VITALS — BP 114/68 | HR 76 | Ht 63.0 in | Wt 165.4 lb

## 2016-05-19 DIAGNOSIS — R188 Other ascites: Secondary | ICD-10-CM

## 2016-05-19 DIAGNOSIS — Z1231 Encounter for screening mammogram for malignant neoplasm of breast: Secondary | ICD-10-CM | POA: Diagnosis not present

## 2016-05-19 DIAGNOSIS — J0100 Acute maxillary sinusitis, unspecified: Secondary | ICD-10-CM | POA: Diagnosis not present

## 2016-05-19 DIAGNOSIS — I8510 Secondary esophageal varices without bleeding: Secondary | ICD-10-CM | POA: Diagnosis not present

## 2016-05-19 DIAGNOSIS — K7460 Unspecified cirrhosis of liver: Secondary | ICD-10-CM

## 2016-05-19 MED ORDER — AMOXICILLIN 875 MG PO TABS: 875.0000 mg | ORAL_TABLET | Freq: Two times a day (BID) | ORAL | 0 refills | Status: DC

## 2016-05-19 MED ORDER — SPIRONOLACTONE 50 MG PO TABS: 50.0000 mg | ORAL_TABLET | Freq: Every day | ORAL | 5 refills | Status: DC

## 2016-05-19 NOTE — Progress Notes (Signed)
Date:  05/19/2016   Name:  Jenna Franklin   DOB:  [birth year]   MRN:  [id number]   Chief Complaint: Hepatic Disease (kidney/liver check. Needs flonase nasal spray filled. Pt was diagnosed non-alcoholic cirrhosis of liver. ) HPI Cirrhosis due to steatosis - complicated by ascites, esophageal varices.  On beta blocker therapy.  She was seen by GI - AFP was normal.  Hepatic US negative for malignancy last fall.  She was supposed to get a call regarding follow up EGD for varices but has not heard anything.  Her weight has been stable on lasix and spironolactone.  Wt Readings from Last 3 Encounters:  05/19/16 165 lb 6.4 oz (75 kg)  05/16/16 168 lb (76.2 kg)  02/18/16 168 lb (76.2 kg)    Renal insuff - last GFR 49.  Instructed to avoid nsaids routinely.  Lab Results  Component Value Date   CREATININE 1.10 (H) 11/19/2015    OM - seen in UC a few days ago with OM.  Started on Zpak.  Still having significant right sided ear and mastoid pain.  No fever or ear drainage.  Mild dizziness, no headache or syncope.  Having to take Advil for pain relief.  Review of Systems  Constitutional: Negative for chills, fatigue and fever.  HENT: Positive for ear pain, sinus pressure and sore throat. Negative for ear discharge and facial swelling.   Eyes: Negative for visual disturbance.  Respiratory: Negative for cough, chest tightness and shortness of breath.   Cardiovascular: Negative for chest pain, palpitations and leg swelling.  Gastrointestinal: Negative for abdominal distention, abdominal pain and diarrhea.  Genitourinary: Negative for dysuria.  Neurological: Positive for light-headedness. Negative for dizziness, syncope and headaches.    Patient Active Problem List   Diagnosis Date Noted  . Esophageal varices in cirrhosis (HCC)   . Cirrhosis of liver with ascites ([place]) 05/29/2015  . Non-alcoholic fatty liver disease 05/29/2015  . Post-menopausal atrophic vaginitis 05/29/2015  . Anxiety  03/25/2015  . Acid reflux 03/25/2015  . Calculus of kidney 03/25/2015  . Fast heart beat 03/25/2015  . Impaired glucose tolerance 09/20/2014  . Adjustment disorder with anxious mood 10/05/2013  . Physical deconditioning 10/05/2013  . Nerve root pain 12/11/2011  . Bursitis, trochanteric 12/11/2011    Prior to Admission medications   Medication Sig Start Date End Date Taking? Authorizing Provider  acetaminophen (TYLENOL) 325 MG tablet Take 650 mg by mouth every 6 (six) hours as needed. Every 8 hr for shoulder pain    Historical Provider, MD  atenolol (TENORMIN) 25 MG tablet Take 1 tablet (25 mg total) by mouth 2 (two) times daily. Reported on 03/25/2015 Patient taking differently: Take 12.5 mg by mouth 2 (two) times daily. Reported on 03/25/2015 07/19/15   Glean Hess, MD  azithromycin [facility]) 250 MG tablet  05/16/16 05/22/16  Historical Provider, MD  Calcium Carbonate-Vitamin D (CALCIUM + D PO) Take by mouth daily. Reported on 06/07/2015    Historical Provider, MD  cyclobenzaprine (FLEXERIL) 10 MG tablet Take 1 tablet (10 mg total) by mouth at bedtime. 11/19/15   Glean Hess, MD  estradiol (ESTRACE) 0.5 MG tablet Take 1 tablet (0.5 mg total) by mouth daily. Reported on 06/07/2015 08/21/15   Glean Hess, MD  fluticasone [facility]) 50 MCG/ACT nasal spray Place 2 sprays into both nostrils daily. 10/08/15   Glean Hess, MD  furosemide (LASIX) 40 MG tablet TAKE (1) TABLET BY MOUTH EVERY DAY 04/07/16   Jesse Sans  Army Melia, MD  spironolactone (ALDACTONE) 100 MG tablet Take 1 tablet (100 mg total) by mouth 2 (two) times daily. Patient taking differently: Take 100 mg by mouth daily.  05/30/15   Lucilla Lame, MD  vitamin E 400 UNIT capsule Take 400 Units by mouth daily.    Historical Provider, MD    Allergies  Allergen Reactions  . Baclofen Anxiety  . Codeine     Past Surgical History:  Procedure Laterality Date  . ABDOMINAL HYSTERECTOMY    . APPENDECTOMY    . BREAST BIOPSY     neg  .  COMBINED HYSTERECTOMY ABDOMINAL W/ A&P REPAIR / OOPHORECTOMY    . ESOPHAGOGASTRODUODENOSCOPY (EGD) WITH PROPOFOL N/A 06/17/2015   Procedure: ESOPHAGOGASTRODUODENOSCOPY (EGD) WITH PROPOFOL;  Surgeon: Lucilla Lame, MD;  Location: [place];  Service: Endoscopy;  Laterality: N/A;  . LAPAROSCOPIC CHOLECYSTECTOMY    . TONSILLECTOMY      Social History  Substance Use Topics  . Smoking status: Never Smoker  . Smokeless tobacco: Never Used  . Alcohol use No     Medication list has been reviewed and updated.   Physical Exam  Constitutional: She is oriented to person, place, and time. She appears well-developed. No distress.  HENT:  Head: Normocephalic and atraumatic.  Right Ear: Ear canal normal. Tympanic membrane is retracted (and dull - no fluid).  Left Ear: Tympanic membrane and ear canal normal.  Nose: Right sinus exhibits maxillary sinus tenderness. Right sinus exhibits no frontal sinus tenderness. Left sinus exhibits maxillary sinus tenderness. Left sinus exhibits no frontal sinus tenderness.  Mouth/Throat: No posterior oropharyngeal erythema.  Neck: Normal range of motion. Neck supple.  Cardiovascular: Normal rate, regular rhythm and normal heart sounds.   Pulmonary/Chest: Effort normal. No respiratory distress. She has no wheezes.  Abdominal: Soft. Bowel sounds are normal. She exhibits no distension, no fluid wave and no ascites. There is no tenderness. There is no rebound and no guarding.  Musculoskeletal: Normal range of motion.  Neurological: She is alert and oriented to person, place, and time.  Skin: Skin is warm and dry. No rash noted.  Psychiatric: She has a normal mood and affect. Her speech is normal and behavior is normal. Thought content normal.  Nursing note and vitals reviewed.   BP 114/68 (BP Location: Right Arm, Patient Position: Sitting, Cuff Size: Normal)   Pulse 76   Ht 5\' 3"  (1.6 m)   Wt 165 lb 6.4 oz (75 kg)   SpO2 97%   BMI 29.30 kg/m    Assessment and Plan: 1. Acute maxillary sinusitis, recurrence not specified Finish Zpak May take Advil for limited time for ear pain - amoxicillin (AMOXIL) 875 MG tablet; Take 1 tablet (875 mg total) by mouth 2 (two) times daily.  Dispense: 20 tablet; Refill: 0  2. Encounter for screening mammogram for breast cancer - MM DIGITAL SCREENING BILATERAL; Future  3. Esophageal varices in cirrhosis (HCC) Continue metoprolol Sent message to Dr. Allen Norris regarding EGD  4. Cirrhosis of liver with ascites, unspecified hepatic cirrhosis type ([place]) Continue diuretics - Comprehensive metabolic panel   Meds ordered this encounter  Medications  . amoxicillin (AMOXIL) 875 MG tablet    Sig: Take 1 tablet (875 mg total) by mouth 2 (two) times daily.    Dispense:  20 tablet    Refill:  0  . spironolactone (ALDACTONE) 50 MG tablet    Sig: Take 1 tablet (50 mg total) by mouth daily.    Dispense:  30 tablet  Refill:  [place], MD [facility]  05/19/2016

## 2016-05-20 LAB — COMPREHENSIVE METABOLIC PANEL
A/G RATIO: 1.3 (ref 1.2–2.2)
ALBUMIN: 3.9 g/dL (ref 3.5–4.8)
ALT: 30 IU/L (ref 0–32)
AST: 29 IU/L (ref 0–40)
Alkaline Phosphatase: 71 IU/L (ref 39–117)
BILIRUBIN TOTAL: 1.1 mg/dL (ref 0.0–1.2)
BUN/Creatinine Ratio: 16 (ref 12–28)
BUN: 16 mg/dL (ref 8–27)
CALCIUM: 9 mg/dL (ref 8.7–10.3)
CHLORIDE: 101 mmol/L (ref 96–106)
CO2: 24 mmol/L (ref 18–29)
Creatinine, Ser: 1 mg/dL (ref 0.57–1.00)
GFR calc Af Amer: 63 mL/min/{1.73_m2} (ref >59)
GFR calc non Af Amer: 55 mL/min/{1.73_m2} — ABNORMAL LOW (ref >59)
Globulin, Total: 2.9 g/dL (ref 1.5–4.5)
Glucose: 105 mg/dL — ABNORMAL HIGH (ref 65–99)
POTASSIUM: 4 mmol/L (ref 3.5–5.2)
Sodium: 139 mmol/L (ref 134–144)
Total Protein: 6.8 g/dL (ref 6.0–8.5)

## 2016-05-20 NOTE — Progress Notes (Signed)
Pt informed

## 2016-05-20 NOTE — Progress Notes (Signed)
Informed pt of lab result- improved kidney function & normal liver. Pt asking can she start taking allegra for allergies after finishing her current antibiotic?

## 2016-06-29 ENCOUNTER — Encounter: Payer: Self-pay | Admitting: Internal Medicine

## 2016-06-29 ENCOUNTER — Ambulatory Visit (INDEPENDENT_AMBULATORY_CARE_PROVIDER_SITE_OTHER): Payer: Medicare HMO | Admitting: Internal Medicine

## 2016-06-29 VITALS — BP 118/58 | HR 64 | Ht 63.0 in | Wt 166.0 lb

## 2016-06-29 DIAGNOSIS — R188 Other ascites: Secondary | ICD-10-CM

## 2016-06-29 DIAGNOSIS — N952 Postmenopausal atrophic vaginitis: Secondary | ICD-10-CM | POA: Diagnosis not present

## 2016-06-29 DIAGNOSIS — N760 Acute vaginitis: Secondary | ICD-10-CM

## 2016-06-29 DIAGNOSIS — K7460 Unspecified cirrhosis of liver: Secondary | ICD-10-CM

## 2016-06-29 DIAGNOSIS — B9689 Other specified bacterial agents as the cause of diseases classified elsewhere: Secondary | ICD-10-CM | POA: Diagnosis not present

## 2016-06-29 DIAGNOSIS — I5032 Chronic diastolic (congestive) heart failure: Secondary | ICD-10-CM

## 2016-06-29 LAB — POCT WET PREP WITH KOH
KOH Prep POC: NEGATIVE
RBC Wet Prep HPF POC: 0
TRICHOMONAS UA: NEGATIVE
WBC Wet Prep HPF POC: 0

## 2016-06-29 LAB — POCT URINALYSIS DIPSTICK
BILIRUBIN UA: NEGATIVE
GLUCOSE UA: NEGATIVE
Ketones, UA: NEGATIVE
LEUKOCYTES UA: NEGATIVE
NITRITE UA: NEGATIVE
PH UA: 5 (ref 5.0–8.0)
PROTEIN UA: NEGATIVE
RBC UA: NEGATIVE
Spec Grav, UA: 1.01 (ref 1.010–1.025)
UROBILINOGEN UA: 0.2 U/dL

## 2016-06-29 MED ORDER — FUROSEMIDE 40 MG PO TABS: ORAL_TABLET | ORAL | 1 refills | Status: DC

## 2016-06-29 MED ORDER — METRONIDAZOLE 500 MG PO TABS: 500.0000 mg | ORAL_TABLET | Freq: Two times a day (BID) | ORAL | 0 refills | Status: DC

## 2016-06-29 MED ORDER — FLUCONAZOLE 100 MG PO TABS: 100.0000 mg | ORAL_TABLET | Freq: Every day | ORAL | 0 refills | Status: DC

## 2016-06-29 MED ORDER — ATENOLOL 25 MG PO TABS: 25.0000 mg | ORAL_TABLET | Freq: Two times a day (BID) | ORAL | 3 refills | Status: DC

## 2016-06-29 MED ORDER — ESTRADIOL 0.5 MG PO TABS: 0.5000 mg | ORAL_TABLET | Freq: Every day | ORAL | 3 refills | Status: DC

## 2016-06-29 NOTE — Patient Instructions (Signed)
Use Desitin or other diaper rash ointment externally if needed to protect delicate tissues

## 2016-06-29 NOTE — Progress Notes (Signed)
Date:  06/29/2016   Name:  Jenna Franklin   DOB:  Sep 27, 1940   MRN:  [id number]   Chief Complaint: Vaginal Pain (Took 3 different antibiotics since Easter, and now is having vaginal discomfort. Burning- not itching. Outside labia is irritated and swollen. Has shooting pain and lower back hurts. Just finished amoxicillin from root canal. )  Cirrhosis with varices - stable weight with no recent issues.  Remains on same medications.  No increase in edema, SOB.  No chest pains or bleeding.  Review of Systems  Constitutional: Negative for chills and fever.  Respiratory: Negative for cough, chest tightness and shortness of breath.   Cardiovascular: Negative for chest pain and palpitations.  Endocrine: Positive for polyuria. Negative for polydipsia.  Genitourinary: Positive for frequency and vaginal pain. Negative for dysuria and vaginal bleeding.  Hematological: Negative for adenopathy.    Patient Active Problem List   Diagnosis Date Noted  . Esophageal varices in cirrhosis (HCC)   . Cirrhosis of liver with ascites ([place]) 05/29/2015  . Non-alcoholic fatty liver disease 05/29/2015  . Post-menopausal atrophic vaginitis 05/29/2015  . Anxiety 03/25/2015  . Acid reflux 03/25/2015  . Calculus of kidney 03/25/2015  . Impaired glucose tolerance 09/20/2014  . Adjustment disorder with anxious mood 10/05/2013  . Physical deconditioning 10/05/2013  . Nerve root pain 12/11/2011  . Bursitis, trochanteric 12/11/2011    Prior to Admission medications   Medication Sig Start Date End Date Taking? Authorizing Provider  acetaminophen (TYLENOL) 325 MG tablet Take 650 mg by mouth every 6 (six) hours as needed. Every 8 hr for shoulder pain   Yes [provider]  atenolol (TENORMIN) 25 MG tablet Take 1 tablet (25 mg total) by mouth 2 (two) times daily. Reported on 03/25/2015 Patient taking differently: Take 12.5 mg by mouth 2 (two) times daily. Reported on 03/25/2015 07/19/15  Yes Glean Hess,  MD  Calcium Carbonate-Vitamin D (CALCIUM + D PO) Take by mouth daily. Reported on 06/07/2015   Yes [provider]  estradiol (ESTRACE) 0.5 MG tablet Take 1 tablet (0.5 mg total) by mouth daily. Reported on 06/07/2015 08/21/15  Yes Glean Hess, MD  fluticasone [facility]) 50 MCG/ACT nasal spray Place 2 sprays into both nostrils daily. 10/08/15  Yes Glean Hess, MD  furosemide (LASIX) 40 MG tablet TAKE (1) TABLET BY MOUTH EVERY DAY 04/07/16  Yes Glean Hess, MD  spironolactone (ALDACTONE) 50 MG tablet Take 1 tablet (50 mg total) by mouth daily. 05/19/16  Yes Glean Hess, MD  vitamin E 400 UNIT capsule Take 400 Units by mouth daily.   Yes [provider]    Allergies  Allergen Reactions  . Baclofen Anxiety  . Codeine     Past Surgical History:  Procedure Laterality Date  . ABDOMINAL HYSTERECTOMY    . APPENDECTOMY    . BREAST BIOPSY     neg  . COMBINED HYSTERECTOMY ABDOMINAL W/ A&P REPAIR / OOPHORECTOMY    . ESOPHAGOGASTRODUODENOSCOPY (EGD) WITH PROPOFOL N/A 06/17/2015   Procedure: ESOPHAGOGASTRODUODENOSCOPY (EGD) WITH PROPOFOL;  Surgeon: Lucilla Lame, MD;  Location: [place];  Service: Endoscopy;  Laterality: N/A;  . LAPAROSCOPIC CHOLECYSTECTOMY    . TONSILLECTOMY      Social History  Substance Use Topics  . Smoking status: Never Smoker  . Smokeless tobacco: Never Used  . Alcohol use No     Medication list has been reviewed and updated.   Physical Exam  Constitutional: She is oriented to  person, place, and time. She appears well-developed. No distress.  HENT:  Head: Normocephalic and atraumatic.  Cardiovascular: Normal rate, regular rhythm and normal heart sounds.   Pulmonary/Chest: Effort normal and breath sounds normal. No respiratory distress.  Abdominal: There is no tenderness. No hernia.  Genitourinary: There is tenderness on the right labia. There is no lesion or injury on the right labia. There is tenderness on the left labia.  There is no lesion or injury on the left labia. Vaginal discharge found.  Musculoskeletal: Normal range of motion.  Neurological: She is alert and oriented to person, place, and time.  Skin: Skin is warm and dry. No rash noted.  Psychiatric: She has a normal mood and affect. Her behavior is normal. Thought content normal.  Nursing note and vitals reviewed.  Microscopic wet-mount exam shows KOH done, clue cells, excessive bacteria, monilia.   BP (!) 118/58 (BP Location: Right Arm, Patient Position: Sitting, Cuff Size: Normal)   Pulse 64   Ht 5\' 3"  (1.6 m)   Wt 166 lb (75.3 kg)   SpO2 97%   BMI 29.41 kg/m   Assessment and Plan: 1. Cirrhosis of liver with ascites, unspecified hepatic cirrhosis type (HCC) controlled - furosemide (LASIX) 40 MG tablet; TAKE (1) TABLET BY MOUTH EVERY DAY  Dispense: 90 tablet; Refill: 1  2. Chronic diastolic CHF (congestive heart failure) (HCC) stable - atenolol (TENORMIN) 25 MG tablet; Take 1 tablet (25 mg total) by mouth 2 (two) times daily. Reported on 03/25/2015  Dispense: 180 tablet; Refill: 3  3. Post-menopausal atrophic vaginitis - estradiol (ESTRACE) 0.5 MG tablet; Take 1 tablet (0.5 mg total) by mouth daily. Reported on 06/07/2015  Dispense: 90 tablet; Refill: 3  4. Yeast vaginitis with BV - POCT Wet Prep with KOH - POCT urinalysis dipstick   Meds ordered this encounter  Medications  . furosemide (LASIX) 40 MG tablet    Sig: TAKE (1) TABLET BY MOUTH EVERY DAY    Dispense:  90 tablet    Refill:  1  . atenolol (TENORMIN) 25 MG tablet    Sig: Take 1 tablet (25 mg total) by mouth 2 (two) times daily. Reported on 03/25/2015    Dispense:  180 tablet    Refill:  3  . estradiol (ESTRACE) 0.5 MG tablet    Sig: Take 1 tablet (0.5 mg total) by mouth daily. Reported on 06/07/2015    Dispense:  90 tablet    Refill:  3  . fluconazole (DIFLUCAN) 100 MG tablet    Sig: Take 1 tablet (100 mg total) by mouth daily.    Dispense:  3 tablet    Refill:  0  .  metroNIDAZOLE (FLAGYL) 500 MG tablet    Sig: Take 1 tablet (500 mg total) by mouth 2 (two) times daily.    Dispense:  14 tablet    Refill:  0    Halina Maidens, MD [facility]  06/29/2016

## 2016-06-30 ENCOUNTER — Telehealth: Payer: Self-pay

## 2016-06-30 NOTE — Telephone Encounter (Signed)
Sent in PA to [employer] for Estradiol- was approved.

## 2016-07-06 ENCOUNTER — Ambulatory Visit
Admission: RE | Admit: 2016-07-06 | Discharge: 2016-07-06 | Disposition: A | Discharge status: Home / Self Care | Payer: Medicare HMO | Source: Ambulatory Visit | Attending: Internal Medicine | Admitting: Internal Medicine

## 2016-07-06 ENCOUNTER — Telehealth: Payer: Self-pay

## 2016-07-06 ENCOUNTER — Telehealth: Payer: Self-pay | Admitting: Physician Assistant

## 2016-07-06 ENCOUNTER — Other Ambulatory Visit: Payer: Self-pay | Admitting: Internal Medicine

## 2016-07-06 DIAGNOSIS — Z1231 Encounter for screening mammogram for malignant neoplasm of breast: Secondary | ICD-10-CM

## 2016-07-06 NOTE — Telephone Encounter (Signed)
Reviewed patient phone call and note from 06/29/2016 with Dr. Army Melia. Wet prep revealed bacterial vaginosis and yeast infection, so patient was treated with fluconazole 100 mg oral daily, 3 tablets. Also treated with oral flagyl 500 mg BID x 7 days. Patient calling today with persistent burning. I can offer her to come in and be re-examined and perform Nuswab to see if there is Candida strain resistant to fluconazole or other issue. Thank you.

## 2016-07-06 NOTE — Telephone Encounter (Signed)
Pt was prescribed diflucan and flagyl antibiotics. Finished these and still has burning and irritation. Wants advice-- ?

## 2016-09-05 ENCOUNTER — Other Ambulatory Visit: Payer: Self-pay | Admitting: Internal Medicine

## 2016-09-05 DIAGNOSIS — N952 Postmenopausal atrophic vaginitis: Secondary | ICD-10-CM

## 2016-09-07 ENCOUNTER — Telehealth: Payer: Self-pay | Admitting: Internal Medicine

## 2016-09-07 NOTE — Telephone Encounter (Signed)
Ordered it 2 days ago to [facility] in [place].

## 2016-09-07 NOTE — Telephone Encounter (Signed)
Medication refill. Pt also called and left Vm about this medication. Please advise.

## 2016-09-07 NOTE — Telephone Encounter (Signed)
Pt needs refill for estradiol (ESTRACE) 0.5 MG tablet   to [facility] in [place]. Needs for today, took last one yesterday. Please Advise.

## 2016-09-07 NOTE — Telephone Encounter (Signed)
Pt informed

## 2016-09-16 DIAGNOSIS — M7061 Trochanteric bursitis, right hip: Secondary | ICD-10-CM | POA: Diagnosis not present

## 2016-09-16 DIAGNOSIS — M5416 Radiculopathy, lumbar region: Secondary | ICD-10-CM | POA: Diagnosis not present

## 2016-10-15 ENCOUNTER — Telehealth: Payer: Self-pay

## 2016-10-15 NOTE — Telephone Encounter (Signed)
Patient called left Vm stating she wanted refill on Flexeril. Informed pt we have not gave that to her since April and she would need a OV to discuss before getting refill. Sent to front desk for appt to be made.

## 2016-10-16 ENCOUNTER — Encounter: Payer: Self-pay | Admitting: Internal Medicine

## 2016-10-16 ENCOUNTER — Ambulatory Visit (INDEPENDENT_AMBULATORY_CARE_PROVIDER_SITE_OTHER): Payer: Medicare HMO | Admitting: Internal Medicine

## 2016-10-16 VITALS — BP 120/72 | HR 77 | Temp 98.1°F | Ht 63.0 in | Wt 169.4 lb

## 2016-10-16 DIAGNOSIS — H6690 Otitis media, unspecified, unspecified ear: Secondary | ICD-10-CM | POA: Diagnosis not present

## 2016-10-16 DIAGNOSIS — S39012A Strain of muscle, fascia and tendon of lower back, initial encounter: Secondary | ICD-10-CM

## 2016-10-16 DIAGNOSIS — M7061 Trochanteric bursitis, right hip: Secondary | ICD-10-CM

## 2016-10-16 MED ORDER — SPIRONOLACTONE 50 MG PO TABS: 50.0000 mg | ORAL_TABLET | Freq: Every day | ORAL | 1 refills | Status: DC

## 2016-10-16 MED ORDER — FLUCONAZOLE 100 MG PO TABS: 100.0000 mg | ORAL_TABLET | Freq: Every day | ORAL | 0 refills | Status: DC

## 2016-10-16 MED ORDER — AZITHROMYCIN 250 MG PO TABS: ORAL_TABLET | ORAL | 0 refills | Status: DC

## 2016-10-16 MED ORDER — CYCLOBENZAPRINE HCL 10 MG PO TABS: 10.0000 mg | ORAL_TABLET | Freq: Three times a day (TID) | ORAL | 0 refills | Status: DC | PRN

## 2016-10-16 NOTE — Progress Notes (Signed)
Date:  10/16/2016   Name:  Jenna Franklin   DOB:  04-13-1940   MRN:  [id number]   Chief Complaint: Otitis Media (Rt ear pain. Even sore on outside of neck and face. Got ear ache drops from [facility]- Hurting for 2 weeks now. Every now and then a sharp pain will run through it.  ) and Back Pain (Wants refill on Flexiril. Pulled back out 2 weeks ago. Was carrying books (which were husbands) working on [employer]. Also, cleaned out garage. )  Back Pain  This is a new problem. The current episode started in the past 7 days. The problem occurs intermittently. The problem has been waxing and waning since onset. The pain does not radiate. Pertinent negatives include no abdominal pain or headaches.  Otalgia   There is pain in the right ear. This is a new problem. The current episode started in the past 7 days. The problem occurs constantly. The problem has been unchanged. There has been no fever. Pertinent negatives include no abdominal pain, ear discharge, headaches or hearing loss.     Review of Systems  HENT: Positive for ear pain. Negative for ear discharge and hearing loss.   Gastrointestinal: Negative for abdominal pain.  Musculoskeletal: Positive for back pain.  Neurological: Negative for headaches.    Patient Active Problem List   Diagnosis Date Noted  . Esophageal varices in cirrhosis (HCC)   . Cirrhosis of liver with ascites ([place]) 05/29/2015  . Non-alcoholic fatty liver disease 05/29/2015  . Post-menopausal atrophic vaginitis 05/29/2015  . Anxiety 03/25/2015  . Acid reflux 03/25/2015  . Calculus of kidney 03/25/2015  . Impaired glucose tolerance 09/20/2014  . Adjustment disorder with anxious mood 10/05/2013  . Physical deconditioning 10/05/2013  . Nerve root pain 12/11/2011  . Bursitis, trochanteric 12/11/2011    Prior to Admission medications   Medication Sig Start Date End Date Taking? Authorizing Provider  acetaminophen (TYLENOL) 325 MG tablet Take 650  mg by mouth every 6 (six) hours as needed. Every 8 hr for shoulder pain   Yes [provider]  atenolol (TENORMIN) 25 MG tablet Take 1 tablet (25 mg total) by mouth 2 (two) times daily. Reported on 03/25/2015 06/29/16  Yes Glean Hess, MD  Calcium Carbonate-Vitamin D (CALCIUM + D PO) Take by mouth daily. Reported on 06/07/2015   Yes [provider]  estradiol (ESTRACE) 0.5 MG tablet TAKE ONE TABLET BY MOUTH ONCE DAILY 09/05/16  Yes Glean Hess, MD  fluticasone [facility]) 50 MCG/ACT nasal spray Place 2 sprays into both nostrils daily. 10/08/15  Yes Glean Hess, MD  furosemide (LASIX) 40 MG tablet TAKE (1) TABLET BY MOUTH EVERY DAY 06/29/16  Yes Glean Hess, MD  metroNIDAZOLE (FLAGYL) 500 MG tablet Take 1 tablet (500 mg total) by mouth 2 (two) times daily. 06/29/16  Yes Glean Hess, MD  spironolactone (ALDACTONE) 50 MG tablet Take 1 tablet (50 mg total) by mouth daily. 05/19/16  Yes Glean Hess, MD  vitamin E 400 UNIT capsule Take 400 Units by mouth daily.   Yes [provider]    Allergies  Allergen Reactions  . Baclofen Anxiety  . Codeine     Past Surgical History:  Procedure Laterality Date  . ABDOMINAL HYSTERECTOMY    . APPENDECTOMY    . BREAST BIOPSY Right    neg needle bx  . COMBINED HYSTERECTOMY ABDOMINAL W/ A&P REPAIR / OOPHORECTOMY    . ESOPHAGOGASTRODUODENOSCOPY (EGD) WITH  PROPOFOL N/A 06/17/2015   Procedure: ESOPHAGOGASTRODUODENOSCOPY (EGD) WITH PROPOFOL;  Surgeon: Lucilla Lame, MD;  Location: [place];  Service: Endoscopy;  Laterality: N/A;  . LAPAROSCOPIC CHOLECYSTECTOMY    . TONSILLECTOMY      Social History  Substance Use Topics  . Smoking status: Never Smoker  . Smokeless tobacco: Never Used  . Alcohol use No     Medication list has been reviewed and updated.  PHQ 2/9 Scores 10/16/2016 08/16/2015 06/13/2015  PHQ - 2 Score 0 0 0    Physical Exam  Constitutional: She is oriented to person, place, and  time. She appears well-developed. No distress.  HENT:  Head: Normocephalic and atraumatic.  Right Ear: Ear canal normal. Tympanic membrane is erythematous and retracted. No middle ear effusion.  Left Ear: Tympanic membrane and ear canal normal.  Nose: Right sinus exhibits no maxillary sinus tenderness. Left sinus exhibits no maxillary sinus tenderness.  Mouth/Throat: No posterior oropharyngeal erythema.  Cardiovascular: Normal rate, regular rhythm and normal heart sounds.   Pulmonary/Chest: Effort normal and breath sounds normal. No respiratory distress. She has no wheezes. She has no rhonchi.  Musculoskeletal: Normal range of motion.  Tender along the paraspinous muscles from mid thoracic to lumbar No spinal tenderness noted  Lymphadenopathy:       Head (right side): Posterior auricular adenopathy present.       Head (left side): No posterior auricular adenopathy present.  Neurological: She is alert and oriented to person, place, and time.  Skin: Skin is warm and dry. No rash noted.  Psychiatric: She has a normal mood and affect. Her behavior is normal. Thought content normal.  Nursing note and vitals reviewed.   BP 120/72   Pulse 77   Temp 98.1 F (36.7 C)   Ht 5\' 3"  (1.6 m)   Wt 169 lb 6.4 oz (76.8 kg)   SpO2 96%   BMI 30.01 kg/m   Assessment and Plan: 1. Back strain, initial encounter - cyclobenzaprine (FLEXERIL) 10 MG tablet; Take 1 tablet (10 mg total) by mouth 3 (three) times daily as needed for muscle spasms.  Dispense: 30 tablet; Refill: 0  2. Acute otitis media, unspecified otitis media type - azithromycin (ZITHROMAX Z-PAK) 250 MG tablet; UAD  Dispense: 6 each; Refill: 0   Meds ordered this encounter  Medications  . cyclobenzaprine (FLEXERIL) 10 MG tablet    Sig: Take 1 tablet (10 mg total) by mouth 3 (three) times daily as needed for muscle spasms.    Dispense:  30 tablet    Refill:  0  . azithromycin (ZITHROMAX Z-PAK) 250 MG tablet    Sig: UAD    Dispense:   6 each    Refill:  0  . fluconazole (DIFLUCAN) 100 MG tablet    Sig: Take 1 tablet (100 mg total) by mouth daily.    Dispense:  3 tablet    Refill:  0  . spironolactone (ALDACTONE) 50 MG tablet    Sig: Take 1 tablet (50 mg total) by mouth daily.    Dispense:  90 tablet    Refill:  1    Partially dictated using Editor, commissioning. Any errors are unintentional.  Halina Maidens, MD [facility]  10/16/2016

## 2016-10-26 ENCOUNTER — Ambulatory Visit: Payer: Medicare HMO | Admitting: Internal Medicine

## 2016-10-27 DIAGNOSIS — H6980 Other specified disorders of Eustachian tube, unspecified ear: Secondary | ICD-10-CM | POA: Diagnosis not present

## 2016-11-16 ENCOUNTER — Ambulatory Visit (INDEPENDENT_AMBULATORY_CARE_PROVIDER_SITE_OTHER): Payer: Medicare HMO

## 2016-11-16 VITALS — BP 120/82 | HR 88 | Temp 98.7°F | Resp 16 | Ht 63.0 in | Wt 169.2 lb

## 2016-11-16 DIAGNOSIS — Z0000 Encounter for general adult medical examination without abnormal findings: Secondary | ICD-10-CM

## 2016-11-16 DIAGNOSIS — Z23 Encounter for immunization: Secondary | ICD-10-CM | POA: Diagnosis not present

## 2016-11-16 NOTE — Progress Notes (Signed)
Subjective:   Jenna Franklin is a 76 y.o. female who presents for Medicare Annual (Subsequent) preventive examination.  Review of Systems:   Cardiac Risk Factors include: advanced age (>24men, >17 women);sedentary lifestyle     Objective:     Vitals: BP 120/82 (BP Location: Left Arm, Patient Position: Sitting, Cuff Size: Normal)   Pulse 88   Temp 98.7 F (37.1 C) (Oral)   Resp 16   Ht 5\' 3"  (1.6 m)   Wt 169 lb 3.2 oz (76.7 kg)   BMI 29.97 kg/m   Body mass index is 29.97 kg/m.   Tobacco History  Smoking Status  . Never Smoker  Smokeless Tobacco  . Never Used     Counseling given: Not Answered   Past Medical History:  Diagnosis Date  . Anemia   . Asthma   . Bronchitis    currently  . CHF (congestive heart failure) ([place])    pt says she thinks she has this  . Cirrhosis of liver ([place])   . GERD (gastroesophageal reflux disease)   . H/O: depression   . Hypertension   . Non-alcoholic fatty liver disease   . Nonalcoholic fatty liver disease   . Osteopenia   . Osteopenia   . Reflux   . Reflux   . Shortness of breath dyspnea   . Vitamin D deficiency   . Vitamin D deficiency    Past Surgical History:  Procedure Laterality Date  . ABDOMINAL HYSTERECTOMY    . APPENDECTOMY    . BREAST BIOPSY Right    neg needle bx  . COMBINED HYSTERECTOMY ABDOMINAL W/ A&P REPAIR / OOPHORECTOMY    . ESOPHAGOGASTRODUODENOSCOPY (EGD) WITH PROPOFOL N/A 06/17/2015   Procedure: ESOPHAGOGASTRODUODENOSCOPY (EGD) WITH PROPOFOL;  Surgeon: Lucilla Lame, MD;  Location: [place];  Service: Endoscopy;  Laterality: N/A;  . LAPAROSCOPIC CHOLECYSTECTOMY    . TONSILLECTOMY     Family History  Problem Relation Age of Onset  . Heart disease Mother   . Heart disease Father   . Heart attack Paternal Grandfather   . Asthma Son 84  . Hypertension Daughter   . Breast cancer Neg Hx    History  Sexual Activity  . Sexual activity: Not on file    Outpatient Encounter Prescriptions  as of 11/16/2016  Medication Sig  . atenolol (TENORMIN) 25 MG tablet Take 1 tablet (25 mg total) by mouth 2 (two) times daily. Reported on 03/25/2015  . Calcium Carbonate-Vitamin D (CALCIUM + D PO) Take by mouth daily. Reported on 06/07/2015  . cyclobenzaprine (FLEXERIL) 10 MG tablet Take 1 tablet (10 mg total) by mouth 3 (three) times daily as needed for muscle spasms.  Marland Kitchen estradiol (ESTRACE) 0.5 MG tablet TAKE ONE TABLET BY MOUTH ONCE DAILY  . fluticasone (FLONASE) 50 MCG/ACT nasal spray Place 2 sprays into both nostrils daily.  . furosemide (LASIX) 40 MG tablet TAKE (1) TABLET BY MOUTH EVERY DAY  . spironolactone (ALDACTONE) 50 MG tablet Take 1 tablet (50 mg total) by mouth daily.  . vitamin E 400 UNIT capsule Take 400 Units by mouth daily.  . [DISCONTINUED] acetaminophen (TYLENOL) 325 MG tablet Take 650 mg by mouth every 6 (six) hours as needed. Every 8 hr for shoulder pain  . [DISCONTINUED] azithromycin (ZITHROMAX Z-PAK) 250 MG tablet UAD (Patient not taking: Reported on 11/16/2016)  . [DISCONTINUED] fluconazole (DIFLUCAN) 100 MG tablet Take 1 tablet (100 mg total) by mouth daily.  . [DISCONTINUED] metroNIDAZOLE (FLAGYL) 500 MG tablet Take 1  tablet (500 mg total) by mouth 2 (two) times daily.   No facility-administered encounter medications on file as of 11/16/2016.     Activities of Daily Living In your present state of health, do you have any difficulty performing the following activities: 11/16/2016 10/16/2016  Hearing? N N  Vision? N N  Difficulty concentrating or making decisions? N N  Walking or climbing stairs? N N  Dressing or bathing? N N  Doing errands, shopping? N N  Preparing Food and eating ? N -  Using the Toilet? N -  In the past six months, have you accidently leaked urine? N -  Do you have problems with loss of bowel control? N -  Managing your Medications? N -  Managing your Finances? N -  Housekeeping or managing your Housekeeping? N -  Some recent data might be hidden     Patient Care Team: Glean Hess, MD as PCP - General (Family Medicine) Lucilla Lame, MD as Consulting Physician (Gastroenterology) Thornton Park, MD as Referring Physician (Orthopedic Surgery) Margaretha Sheffield, MD as Consulting Physician (Otolaryngology)    Assessment:     Exercise Activities and Dietary recommendations Current Exercise Habits: The patient does not participate in regular exercise at present, Exercise limited by: neurologic condition(s) (Vertigo)  Goals    . Increase water intake          Recommend to increase fluid intake to 4-6 glasses every day      Fall Risk Fall Risk  11/16/2016 10/16/2016 08/16/2015 06/13/2015  Falls in the past year? No No No No  Risk for fall due to : Impaired balance/gait - - -  Risk for fall due to: Comment Vertigo - - -   Depression Screen PHQ 2/9 Scores 11/16/2016 10/16/2016 08/16/2015 06/13/2015  PHQ - 2 Score 0 0 0 0     Cognitive Function     6CIT Screen 11/16/2016  What Year? 0 points  What month? 0 points  What time? 0 points  Count back from 20 0 points  Months in reverse 0 points  Repeat phrase 0 points  Total Score 0    Immunization History  Administered Date(s) Administered  . Influenza, High Dose Seasonal PF 11/16/2016  . Influenza,inj,Quad PF,6+ Mos 02/18/2016  . Pneumococcal Conjugate-13 11/16/2016   Screening Tests Health Maintenance  Topic Date Due  . INFLUENZA VACCINE  11/16/2016 (Originally 09/16/2016)  . TETANUS/TDAP  02/16/2017 (Originally 05/04/1959)  . PNA vac Low Risk Adult (2 of 2 - PPSV23) 11/16/2017  . DEXA SCAN  Completed      Plan:    I have personally reviewed and addressed the Medicare Annual Wellness questionnaire and have noted the following in the patient's chart:  A. Medical and social history B. Use of alcohol, tobacco or illicit drugs  C. Current medications and supplements D. Functional ability and status E.  Nutritional status F.  Physical activity G. Advance  directives H. List of other physicians I.  Hospitalizations, surgeries, and ER visits in previous 12 months J.  [place] such as hearing and vision if needed, cognitive and depression L. Referrals and appointments - none  In addition, I have reviewed and discussed with patient certain preventive protocols, quality metrics, and best practice recommendations. A written personalized care plan for preventive services as well as general preventive health recommendations were provided to patient.  See attached scanned questionnaire for additional information.   Signed,  Aleatha Borer, LPN Nurse Health Advisor   MD Recommendations: None

## 2016-11-16 NOTE — Patient Instructions (Addendum)
Jenna Franklin , Thank you for taking time to come for your Medicare Wellness Visit. I appreciate your ongoing commitment to your health goals. Please review the following plan we discussed and let me know if I can assist you in the future.   Screening recommendations/referrals: Colonoscopy: no longer required Mammogram: completed 07/07/16 Bone Density: completed 02/21/13 Recommended yearly ophthalmology/optometry visit for glaucoma screening and checkup Recommended yearly dental visit for hygiene and checkup  Vaccinations: Influenza vaccine: completed today Pneumococcal vaccine: completed first dose today. Will need second dose in 18 months Tdap vaccine: declined. Will check with insurance Shingles vaccine: declined. Will check with insurance    Advanced directives: Please bring a copy of your POA (Power of [place]) and/or Living Will to your next appointment.   Conditions/risks identified: Recommend to increase fluid intake to 4-6 glasses every day; Fall risk prevention discussed  Next appointment: Follow up with Dr. Army Melia 11/18/16 @ 10:30am. Follow up annual wellness in one year  Pneumococcal Conjugate Vaccine (PCV13) What You Need to Know 1. Why get vaccinated? Vaccination can protect both children and adults from pneumococcal disease. Pneumococcal disease is caused by bacteria that can spread from person to person through close contact. It can cause ear infections, and it can also lead to more serious infections of the:  Lungs (pneumonia),  Blood (bacteremia), and  Covering of the brain and spinal cord (meningitis).  Pneumococcal pneumonia is most common among adults. Pneumococcal meningitis can cause deafness and brain damage, and it kills about 1 child in 10 who get it. Anyone can get pneumococcal disease, but children under 26 years of age and adults 19 years and older, people with certain medical conditions, and cigarette smokers are at the highest risk. Before there was a  vaccine, the [place] saw:  more than 700 cases of meningitis,  about 13,000 blood infections,  about 5 million ear infections, and  about 200 deaths  in children under 5 each year from pneumococcal disease. Since vaccine became available, severe pneumococcal disease in these children has fallen by 88%. About 18,000 older adults die of pneumococcal disease each year in the [place]. Treatment of pneumococcal infections with penicillin and other drugs is not as effective as it used to be, because some strains of the disease have become resistant to these drugs. This makes prevention of the disease, through vaccination, even more important. 2. PCV13 vaccine Pneumococcal conjugate vaccine (called PCV13) protects against 13 types of pneumococcal bacteria. PCV13 is routinely given to children at 2, 4, 6, and 30-48 months of age. It is also recommended for children and adults 73 to 31 years of age with certain health conditions, and for all adults 47 years of age and older. Your doctor can give you details. 3. Some people should not get this vaccine Anyone who has ever had a life-threatening allergic reaction to a dose of this vaccine, to an earlier pneumococcal vaccine called PCV7, or to any vaccine containing diphtheria toxoid (for example, DTaP), should not get PCV13. Anyone with a severe allergy to any component of PCV13 should not get the vaccine. Tell your doctor if the person being vaccinated has any severe allergies. If the person scheduled for vaccination is not feeling well, your healthcare provider might decide to reschedule the shot on another day. 4. Risks of a vaccine reaction With any medicine, including vaccines, there is a chance of reactions. These are usually mild and go away on their own, but serious reactions are also possible. Problems reported  following PCV13 varied by age and dose in the series. The most common problems reported among children were:  About half  became drowsy after the shot, had a temporary loss of appetite, or had redness or tenderness where the shot was given.  About 1 out of 3 had swelling where the shot was given.  About 1 out of 3 had a mild fever, and about 1 in 20 had a fever over 102.45F.  Up to about 8 out of 10 became fussy or irritable.  Adults have reported pain, redness, and swelling where the shot was given; also mild fever, fatigue, headache, chills, or muscle pain. Young children who get PCV13 along with inactivated flu vaccine at the same time may be at increased risk for seizures caused by fever. Ask your doctor for more information. Problems that could happen after any vaccine:  People sometimes faint after a medical procedure, including vaccination. Sitting or lying down for about 15 minutes can help prevent fainting, and injuries caused by a fall. Tell your doctor if you feel dizzy, or have vision changes or ringing in the ears.  Some older children and adults get severe pain in the shoulder and have difficulty moving the arm where a shot was given. This happens very rarely.  Any medication can cause a severe allergic reaction. Such reactions from a vaccine are very rare, estimated at about 1 in a million doses, and would happen within a few minutes to a few hours after the vaccination. As with any medicine, there is a very small chance of a vaccine causing a serious injury or death. The safety of vaccines is always being monitored. For more information, visit: http://www.aguilar.org/ 5. What if there is a serious reaction? What should I look for? Look for anything that concerns you, such as signs of a severe allergic reaction, very high fever, or unusual behavior. Signs of a severe allergic reaction can include hives, swelling of the face and throat, difficulty breathing, a fast heartbeat, dizziness, and weakness-usually within a few minutes to a few hours after the vaccination. What should I do?  If you  think it is a severe allergic reaction or other emergency that can't wait, call 9-1-1 or get the person to the nearest hospital. Otherwise, call your doctor.  Reactions should be reported to the Vaccine Adverse Event Reporting System (VAERS). Your doctor should file this report, or you can do it yourself through the VAERS web site at www.vaers.SamedayNews.es, or by calling 931-786-4634. ? VAERS does not give medical advice. 6. The National Vaccine Injury Compensation Program The [employer] Vaccine Injury Compensation Program (VICP) is a federal program that was created to compensate people who may have been injured by certain vaccines. Persons who believe they may have been injured by a vaccine can learn about the program and about filing a claim by calling (831)461-5400 or visiting the [place] website at GoldCloset.com.ee. There is a time limit to file a claim for compensation. 7. How can I learn more?  Ask your healthcare provider. He or she can give you the vaccine package insert or suggest other sources of information.  Call your local or state health department.  Contact the Centers for Disease Control and Prevention (CDC): ? Call 859-674-6603 (1-800-CDC-INFO) or ? Visit CDC's website at http://hunter.com/ Vaccine Information Statement, PCV13 Vaccine (12/21/2013) This information is not intended to replace advice given to you by your health care provider. Make sure you discuss any questions you have with your health care provider. Document  Released: 11/30/2005 Document Revised: 10/24/2015 Document Reviewed: 10/24/2015 Elsevier Interactive Patient Education  2017 [place].   Influenza (Flu) Vaccine (Inactivated or Recombinant): What You Need to Know 1. Why get vaccinated? Influenza ("flu") is a contagious disease that spreads around the [place] every year, usually between October and May. Flu is caused by influenza viruses, and is spread mainly by coughing, sneezing,  and close contact. Anyone can get flu. Flu strikes suddenly and can last several days. Symptoms vary by age, but can include:  fever/chills  sore throat  muscle aches  fatigue  cough  headache  runny or stuffy nose  Flu can also lead to pneumonia and blood infections, and cause diarrhea and seizures in children. If you have a medical condition, such as heart or lung disease, flu can make it worse. Flu is more dangerous for some people. Infants and young children, people 69 years of age and older, pregnant women, and people with certain health conditions or a weakened immune system are at greatest risk. Each year thousands of people in the [place] die from flu, and many more are hospitalized. Flu vaccine can:  keep you from getting flu,  make flu less severe if you do get it, and  keep you from spreading flu to your family and other people. 2. Inactivated and recombinant flu vaccines A dose of flu vaccine is recommended every flu season. Children 6 months through 68 years of age may need two doses during the same flu season. Everyone else needs only one dose each flu season. Some inactivated flu vaccines contain a very small amount of a mercury-based preservative called thimerosal. Studies have not shown thimerosal in vaccines to be harmful, but flu vaccines that do not contain thimerosal are available. There is no live flu virus in flu shots. They cannot cause the flu. There are many flu viruses, and they are always changing. Each year a new flu vaccine is made to protect against three or four viruses that are likely to cause disease in the upcoming flu season. But even when the vaccine doesn't exactly match these viruses, it may still provide some protection. Flu vaccine cannot prevent:  flu that is caused by a virus not covered by the vaccine, or  illnesses that look like flu but are not.  It takes about 2 weeks for protection to develop after vaccination, and protection  lasts through the flu season. 3. Some people should not get this vaccine Tell the person who is giving you the vaccine:  If you have any severe, life-threatening allergies. If you ever had a life-threatening allergic reaction after a dose of flu vaccine, or have a severe allergy to any part of this vaccine, you may be advised not to get vaccinated. Most, but not all, types of flu vaccine contain a small amount of egg protein.  If you ever had Guillain-Barr Syndrome (also called GBS). Some people with a history of GBS should not get this vaccine. This should be discussed with your doctor.  If you are not feeling well. It is usually okay to get flu vaccine when you have a mild illness, but you might be asked to come back when you feel better.  4. Risks of a vaccine reaction With any medicine, including vaccines, there is a chance of reactions. These are usually mild and go away on their own, but serious reactions are also possible. Most people who get a flu shot do not have any problems with it. Minor problems  following a flu shot include:  soreness, redness, or swelling where the shot was given  hoarseness  sore, red or itchy eyes  cough  fever  aches  headache  itching  fatigue  If these problems occur, they usually begin soon after the shot and last 1 or 2 days. More serious problems following a flu shot can include the following:  There may be a small increased risk of Guillain-Barre Syndrome (GBS) after inactivated flu vaccine. This risk has been estimated at 1 or 2 additional cases per million people vaccinated. This is much lower than the risk of severe complications from flu, which can be prevented by flu vaccine.  Young children who get the flu shot along with pneumococcal vaccine (PCV13) and/or DTaP vaccine at the same time might be slightly more likely to have a seizure caused by fever. Ask your doctor for more information. Tell your doctor if a child who is getting  flu vaccine has ever had a seizure.  Problems that could happen after any injected vaccine:  People sometimes faint after a medical procedure, including vaccination. Sitting or lying down for about 15 minutes can help prevent fainting, and injuries caused by a fall. Tell your doctor if you feel dizzy, or have vision changes or ringing in the ears.  Some people get severe pain in the shoulder and have difficulty moving the arm where a shot was given. This happens very rarely.  Any medication can cause a severe allergic reaction. Such reactions from a vaccine are very rare, estimated at about 1 in a million doses, and would happen within a few minutes to a few hours after the vaccination. As with any medicine, there is a very remote chance of a vaccine causing a serious injury or death. The safety of vaccines is always being monitored. For more information, visit: http://www.aguilar.org/ 5. What if there is a serious reaction? What should I look for? Look for anything that concerns you, such as signs of a severe allergic reaction, very high fever, or unusual behavior. Signs of a severe allergic reaction can include hives, swelling of the face and throat, difficulty breathing, a fast heartbeat, dizziness, and weakness. These would start a few minutes to a few hours after the vaccination. What should I do?  If you think it is a severe allergic reaction or other emergency that can't wait, call 9-1-1 and get the person to the nearest hospital. Otherwise, call your doctor.  Reactions should be reported to the Vaccine Adverse Event Reporting System (VAERS). Your doctor should file this report, or you can do it yourself through the VAERS web site at www.vaers.SamedayNews.es, or by calling [phone]. ? VAERS does not give medical advice. 6. The National Vaccine Injury Compensation Program The [employer] Vaccine Injury Compensation Program (VICP) is a federal program that was created to compensate people  who may have been injured by certain vaccines. Persons who believe they may have been injured by a vaccine can learn about the program and about filing a claim by calling (507)150-6411 or visiting the [place] website at GoldCloset.com.ee. There is a time limit to file a claim for compensation. 7. How can I learn more?  Ask your healthcare provider. He or she can give you the vaccine package insert or suggest other sources of information.  Call your local or state health department.  Contact the Centers for Disease Control and Prevention (CDC): ? Call 650-651-7841 (1-800-CDC-INFO) or ? Visit CDC's website at https://gibson.com/ Vaccine Information Statement, Inactivated Influenza Vaccine (  09/22/2013) This information is not intended to replace advice given to you by your health care provider. Make sure you discuss any questions you have with your health care provider. Document Released: 11/27/2005 Document Revised: 10/24/2015 Document Reviewed: 10/24/2015 Elsevier Interactive Patient Education  2017 [place] 65 Years and Older, Female Preventive care refers to lifestyle choices and visits with your health care provider that can promote health and wellness. What does preventive care include?  A yearly physical exam. This is also called an annual well check.  Dental exams once or twice a year.  Routine eye exams. Ask your health care provider how often you should have your eyes checked.  Personal lifestyle choices, including:  Daily care of your teeth and gums.  Regular physical activity.  Eating a healthy diet.  Avoiding tobacco and drug use.  Limiting alcohol use.  Practicing safe sex.  Taking low-dose aspirin every day.  Taking vitamin and mineral supplements as recommended by your health care provider. What happens during an annual well check? The services and screenings done by your health care provider during your annual well check  will depend on your age, overall health, lifestyle risk factors, and family history of disease. Counseling  Your health care provider may ask you questions about your:  Alcohol use.  Tobacco use.  Drug use.  Emotional well-being.  Home and relationship well-being.  Sexual activity.  Eating habits.  History of falls.  Memory and ability to understand (cognition).  Work and work Statistician.  Reproductive health. Screening  You may have the following tests or measurements:  Height, weight, and BMI.  Blood pressure.  Lipid and cholesterol levels. These may be checked every 5 years, or more frequently if you are over 42 years old.  Skin check.  Lung cancer screening. You may have this screening every year starting at age 57 if you have a 30-pack-year history of smoking and currently smoke or have quit within the past 15 years.  Fecal occult blood test (FOBT) of the stool. You may have this test every year starting at age 31.  Flexible sigmoidoscopy or colonoscopy. You may have a sigmoidoscopy every 5 years or a colonoscopy every 10 years starting at age 3.  Hepatitis C blood test.  Hepatitis B blood test.  Sexually transmitted disease (STD) testing.  Diabetes screening. This is done by checking your blood sugar (glucose) after you have not eaten for a while (fasting). You may have this done every 1-3 years.  Bone density scan. This is done to screen for osteoporosis. You may have this done starting at age 57.  Mammogram. This may be done every 1-2 years. Talk to your health care provider about how often you should have regular mammograms. Talk with your health care provider about your test results, treatment options, and if necessary, the need for more tests. Vaccines  Your health care provider may recommend certain vaccines, such as:  Influenza vaccine. This is recommended every year.  Tetanus, diphtheria, and acellular pertussis (Tdap, Td) vaccine. You may  need a Td booster every 10 years.  Zoster vaccine. You may need this after age 11.  Pneumococcal 13-valent conjugate (PCV13) vaccine. One dose is recommended after age 70.  Pneumococcal polysaccharide (PPSV23) vaccine. One dose is recommended after age 24. Talk to your health care provider about which screenings and vaccines you need and how often you need them. This information is not intended to replace advice given to you by your  health care provider. Make sure you discuss any questions you have with your health care provider. Document Released: 03/01/2015 Document Revised: 10/23/2015 Document Reviewed: 12/04/2014 Elsevier Interactive Patient Education  2017 [place] Prevention in the Home Falls can cause injuries. They can happen to people of all ages. There are many things you can do to make your home safe and to help prevent falls. What can I do on the outside of my home?  Regularly fix the edges of walkways and driveways and fix any cracks.  Remove anything that might make you trip as you walk through a door, such as a raised step or threshold.  Trim any bushes or trees on the path to your home.  Use bright outdoor lighting.  Clear any walking paths of anything that might make someone trip, such as rocks or tools.  Regularly check to see if handrails are loose or broken. Make sure that both sides of any steps have handrails.  Any raised decks and porches should have guardrails on the edges.  Have any leaves, snow, or ice cleared regularly.  Use sand or salt on walking paths during winter.  Clean up any spills in your garage right away. This includes oil or grease spills. What can I do in the bathroom?  Use night lights.  Install grab bars by the toilet and in the tub and shower. Do not use towel bars as grab bars.  Use non-skid mats or decals in the tub or shower.  If you need to sit down in the shower, use a plastic, non-slip stool.  Keep the floor  dry. Clean up any water that spills on the floor as soon as it happens.  Remove soap buildup in the tub or shower regularly.  Attach bath mats securely with double-sided non-slip rug tape.  Do not have throw rugs and other things on the floor that can make you trip. What can I do in the bedroom?  Use night lights.  Make sure that you have a light by your bed that is easy to reach.  Do not use any sheets or blankets that are too big for your bed. They should not hang down onto the floor.  Have a firm chair that has side arms. You can use this for support while you get dressed.  Do not have throw rugs and other things on the floor that can make you trip. What can I do in the kitchen?  Clean up any spills right away.  Avoid walking on wet floors.  Keep items that you use a lot in easy-to-reach places.  If you need to reach something above you, use a strong step stool that has a grab bar.  Keep electrical cords out of the way.  Do not use floor polish or wax that makes floors slippery. If you must use wax, use non-skid floor wax.  Do not have throw rugs and other things on the floor that can make you trip. What can I do with my stairs?  Do not leave any items on the stairs.  Make sure that there are handrails on both sides of the stairs and use them. Fix handrails that are broken or loose. Make sure that handrails are as long as the stairways.  Check any carpeting to make sure that it is firmly attached to the stairs. Fix any carpet that is loose or worn.  Avoid having throw rugs at the top or bottom of the stairs. If you do have  throw rugs, attach them to the floor with carpet tape.  Make sure that you have a light switch at the top of the stairs and the bottom of the stairs. If you do not have them, ask someone to add them for you. What else can I do to help prevent falls?  Wear shoes that:  Do not have high heels.  Have rubber bottoms.  Are comfortable and fit you  well.  Are closed at the toe. Do not wear sandals.  If you use a stepladder:  Make sure that it is fully opened. Do not climb a closed stepladder.  Make sure that both sides of the stepladder are locked into place.  Ask someone to hold it for you, if possible.  Clearly mark and make sure that you can see:  Any grab bars or handrails.  First and last steps.  Where the edge of each step is.  Use tools that help you move around (mobility aids) if they are needed. These include:  Cane.  Walkers.  Scooters.  Crutches.  Turn on the lights when you go into a dark area. Replace any light bulbs as soon as they burn out.  Set up your furniture so you have a clear path. Avoid moving your furniture around.  If any of your floors are uneven, fix them.  If there are any pets around you, be aware of where they are.  Review your medicines with your doctor. Some medicines can make you feel dizzy. This can increase your chance of falling. Ask your doctor what other things that you can do to help prevent falls. This information is not intended to replace advice given to you by your health care provider. Make sure you discuss any questions you have with your health care provider. Document Released: 11/29/2008 Document Revised: 07/11/2015 Document Reviewed: 03/09/2014 Elsevier Interactive Patient Education  2017 [employer].

## 2016-11-18 ENCOUNTER — Other Ambulatory Visit
Admission: RE | Admit: 2016-11-18 | Discharge: 2016-11-18 | Disposition: A | Discharge status: Home / Self Care | Payer: Medicare HMO | Source: Ambulatory Visit | Attending: Internal Medicine | Admitting: Internal Medicine

## 2016-11-18 ENCOUNTER — Encounter: Payer: Self-pay | Admitting: Internal Medicine

## 2016-11-18 ENCOUNTER — Ambulatory Visit (INDEPENDENT_AMBULATORY_CARE_PROVIDER_SITE_OTHER): Payer: Medicare HMO | Admitting: Internal Medicine

## 2016-11-18 VITALS — BP 118/64 | HR 84 | Ht 63.0 in | Wt 169.0 lb

## 2016-11-18 DIAGNOSIS — K7460 Unspecified cirrhosis of liver: Secondary | ICD-10-CM | POA: Insufficient documentation

## 2016-11-18 DIAGNOSIS — R7302 Impaired glucose tolerance (oral): Secondary | ICD-10-CM | POA: Diagnosis not present

## 2016-11-18 DIAGNOSIS — R188 Other ascites: Secondary | ICD-10-CM | POA: Diagnosis not present

## 2016-11-18 DIAGNOSIS — N952 Postmenopausal atrophic vaginitis: Secondary | ICD-10-CM

## 2016-11-18 DIAGNOSIS — R69 Illness, unspecified: Secondary | ICD-10-CM | POA: Diagnosis not present

## 2016-11-18 DIAGNOSIS — F4322 Adjustment disorder with anxiety: Secondary | ICD-10-CM | POA: Diagnosis not present

## 2016-11-18 DIAGNOSIS — I8510 Secondary esophageal varices without bleeding: Secondary | ICD-10-CM | POA: Diagnosis not present

## 2016-11-18 DIAGNOSIS — M62838 Other muscle spasm: Secondary | ICD-10-CM | POA: Insufficient documentation

## 2016-11-18 LAB — POCT URINALYSIS DIPSTICK
BILIRUBIN UA: NEGATIVE
GLUCOSE UA: NEGATIVE
Ketones, UA: NEGATIVE
LEUKOCYTES UA: NEGATIVE
Nitrite, UA: NEGATIVE
Protein, UA: NEGATIVE
Spec Grav, UA: 1.02 (ref 1.010–1.025)
Urobilinogen, UA: 0.2 E.U./dL
pH, UA: 5 (ref 5.0–8.0)

## 2016-11-18 LAB — HEMOGLOBIN A1C
HEMOGLOBIN A1C: 5.3 % (ref 4.8–5.6)
MEAN PLASMA GLUCOSE: 105.41 mg/dL

## 2016-11-18 LAB — CBC WITH DIFFERENTIAL/PLATELET
BASOS ABS: 0 10*3/uL (ref 0–0.1)
BASOS PCT: 0 %
EOS PCT: 4 %
Eosinophils Absolute: 0.2 10*3/uL (ref 0–0.7)
HEMATOCRIT: 42.6 % (ref 35.0–47.0)
Hemoglobin: 14.7 g/dL (ref 12.0–16.0)
LYMPHS PCT: 28 %
Lymphs Abs: 1.3 10*3/uL (ref 1.0–3.6)
MCH: 34.7 pg — ABNORMAL HIGH (ref 26.0–34.0)
MCHC: 34.6 g/dL (ref 32.0–36.0)
MCV: 100.3 fL — AB (ref 80.0–100.0)
MONO ABS: 0.4 10*3/uL (ref 0.2–0.9)
Monocytes Relative: 9 %
NEUTROS ABS: 2.7 10*3/uL (ref 1.4–6.5)
Neutrophils Relative %: 59 %
PLATELETS: 86 10*3/uL — AB (ref 150–440)
RBC: 4.25 MIL/uL (ref 3.80–5.20)
RDW: 13.9 % (ref 11.5–14.5)
WBC: 4.5 10*3/uL (ref 3.6–11.0)

## 2016-11-18 LAB — COMPREHENSIVE METABOLIC PANEL
ALBUMIN: 4.6 g/dL (ref 3.5–5.0)
ALT: 34 U/L (ref 14–54)
AST: 41 U/L (ref 15–41)
Alkaline Phosphatase: 65 U/L (ref 38–126)
Anion gap: 9 (ref 5–15)
BUN: 23 mg/dL — AB (ref 6–20)
CHLORIDE: 103 mmol/L (ref 101–111)
CO2: 23 mmol/L (ref 22–32)
CREATININE: 0.98 mg/dL (ref 0.44–1.00)
Calcium: 8.9 mg/dL (ref 8.9–10.3)
GFR calc Af Amer: >60 mL/min (ref >60)
GFR, EST NON AFRICAN AMERICAN: 55 mL/min — AB (ref >60)
Glucose, Bld: 135 mg/dL — ABNORMAL HIGH (ref 65–99)
POTASSIUM: 4.1 mmol/L (ref 3.5–5.1)
Sodium: 135 mmol/L (ref 135–145)
Total Bilirubin: 1.7 mg/dL — ABNORMAL HIGH (ref 0.3–1.2)
Total Protein: 7.8 g/dL (ref 6.5–8.1)

## 2016-11-18 LAB — LIPID PANEL
CHOL/HDL RATIO: 2.5 ratio
Cholesterol: 119 mg/dL (ref 0–200)
HDL: 48 mg/dL (ref >40)
LDL CALC: 56 mg/dL (ref 0–99)
Triglycerides: 76 mg/dL (ref <150)
VLDL: 15 mg/dL (ref 0–40)

## 2016-11-18 LAB — TSH: TSH: 2.335 u[IU]/mL (ref 0.350–4.500)

## 2016-11-18 NOTE — Progress Notes (Signed)
Date:  11/18/2016   Name:  Jenna Franklin   DOB:  1940/06/08   MRN:  [id number]   Chief Complaint: Annual Exam (Breast Exam)  Jenna Franklin is a 76 y.o. female who presents today for her Complete Annual Exam. She feels fairly well. She reports exercising none. She reports she is sleeping poorly due to frequent urination. Flexeril does help by relaxing her neck and shoulders and helps with sounder sleep. She had a mammogram in May - normal.  No breast issues. Her weight is stable on current diuretic dose.  No increase in edema.  She is on metoprolol for varices due to cirrhosis.  She has an EGD scheduled in January.  She denies dysphagia or bleeding. She has intermittent back pain and spasm - esp with prolonged standing.  Flexeril has been helpful and not overly sedating.  Review of Systems  Constitutional: Negative for chills, fatigue, fever and unexpected weight change.  HENT: Negative for congestion, hearing loss, tinnitus, trouble swallowing and voice change.   Eyes: Negative for visual disturbance.  Respiratory: Negative for cough, chest tightness, shortness of breath and wheezing.   Cardiovascular: Negative for chest pain, palpitations and leg swelling.  Gastrointestinal: Negative for abdominal pain, constipation, diarrhea and vomiting.  Endocrine: Negative for polydipsia and polyuria.  Genitourinary: Positive for frequency. Negative for dysuria, genital sores, vaginal bleeding and vaginal discharge.  Musculoskeletal: Positive for back pain and neck stiffness. Negative for arthralgias, gait problem, joint swelling and neck pain.  Skin: Negative for color change and rash.  Neurological: Negative for dizziness, tremors, light-headedness and headaches.  Hematological: Negative for adenopathy. Does not bruise/bleed easily.  Psychiatric/Behavioral: Negative for dysphoric mood and sleep disturbance. The patient is not nervous/anxious.     Patient Active Problem List   Diagnosis Date Noted  . Esophageal varices in cirrhosis (HCC)   . Cirrhosis of liver with ascites ([place]) 05/29/2015  . Non-alcoholic fatty liver disease 05/29/2015  . Post-menopausal atrophic vaginitis 05/29/2015  . Anxiety 03/25/2015  . Acid reflux 03/25/2015  . Calculus of kidney 03/25/2015  . Impaired glucose tolerance 09/20/2014  . Adjustment disorder with anxious mood 10/05/2013  . Physical deconditioning 10/05/2013  . Nerve root pain 12/11/2011  . Bursitis, trochanteric 12/11/2011    Prior to Admission medications   Medication Sig Start Date End Date Taking? Authorizing Provider  atenolol (TENORMIN) 25 MG tablet Take 1 tablet (25 mg total) by mouth 2 (two) times daily. Reported on 03/25/2015 06/29/16  Yes Glean Hess, MD  Calcium Carbonate-Vitamin D (CALCIUM + D PO) Take by mouth daily. Reported on 06/07/2015   Yes [provider]  cyclobenzaprine (FLEXERIL) 10 MG tablet Take 1 tablet (10 mg total) by mouth 3 (three) times daily as needed for muscle spasms. 10/16/16  Yes Glean Hess, MD  estradiol (ESTRACE) 0.5 MG tablet TAKE ONE TABLET BY MOUTH ONCE DAILY 09/05/16  Yes Glean Hess, MD  fluticasone [facility]) 50 MCG/ACT nasal spray Place 2 sprays into both nostrils daily. 10/08/15  Yes Glean Hess, MD  furosemide (LASIX) 40 MG tablet TAKE (1) TABLET BY MOUTH EVERY DAY 06/29/16  Yes Glean Hess, MD  spironolactone (ALDACTONE) 50 MG tablet Take 1 tablet (50 mg total) by mouth daily. 10/16/16  Yes Glean Hess, MD  vitamin E 400 UNIT capsule Take 400 Units by mouth daily.   Yes [provider]    Allergies  Allergen Reactions  . Baclofen Anxiety  . Codeine Nausea And  Vomiting  . Prednisone Anxiety    Past Surgical History:  Procedure Laterality Date  . ABDOMINAL HYSTERECTOMY    . APPENDECTOMY    . BREAST BIOPSY Right    neg needle bx  . COMBINED HYSTERECTOMY ABDOMINAL W/ A&P REPAIR / OOPHORECTOMY    . ESOPHAGOGASTRODUODENOSCOPY  (EGD) WITH PROPOFOL N/A 06/17/2015   Procedure: ESOPHAGOGASTRODUODENOSCOPY (EGD) WITH PROPOFOL;  Surgeon: Lucilla Lame, MD;  Location: [facility];  Service: Endoscopy;  Laterality: N/A;  . LAPAROSCOPIC CHOLECYSTECTOMY    . TONSILLECTOMY      Social History  Substance Use Topics  . Smoking status: Never Smoker  . Smokeless tobacco: Never Used  . Alcohol use No     Medication list has been reviewed and updated.  PHQ 2/9 Scores 11/16/2016 10/16/2016 08/16/2015 06/13/2015  PHQ - 2 Score 0 0 0 0   Urine dipstick shows negative for all components.  Physical Exam  Constitutional: She is oriented to person, place, and time. She appears well-developed and well-nourished. No distress.  HENT:  Head: Normocephalic and atraumatic.  Right Ear: Tympanic membrane and ear canal normal.  Left Ear: Tympanic membrane and ear canal normal.  Nose: Right sinus exhibits no maxillary sinus tenderness. Left sinus exhibits no maxillary sinus tenderness.  Mouth/Throat: Uvula is midline and oropharynx is clear and moist.  Eyes: Conjunctivae and EOM are normal. Right eye exhibits no discharge. Left eye exhibits no discharge. No scleral icterus.  Neck: Normal range of motion. Carotid bruit is not present. No erythema present. No thyromegaly present.  Cardiovascular: Normal rate, regular rhythm, normal heart sounds and normal pulses.   Pulmonary/Chest: Effort normal. No respiratory distress. She has no wheezes. Right breast exhibits no mass, no nipple discharge, no skin change and no tenderness. Left breast exhibits no mass, no nipple discharge, no skin change and no tenderness.  Abdominal: Soft. Bowel sounds are normal. There is no hepatosplenomegaly. There is no tenderness. There is no rebound, no guarding and no CVA tenderness.  Musculoskeletal: Normal range of motion.  Lymphadenopathy:    She has no cervical adenopathy.    She has no axillary adenopathy.  Neurological: She is alert and oriented to  person, place, and time. She has normal reflexes. No cranial nerve deficit or sensory deficit.  Skin: Skin is warm, dry and intact. No rash noted.  Psychiatric: She has a normal mood and affect. Her speech is normal and behavior is normal. Thought content normal.  Nursing note and vitals reviewed.   BP 118/64   Pulse 84   Ht 5\' 3"  (1.6 m)   Wt 169 lb (76.7 kg)   SpO2 95%   BMI 29.94 kg/m   Assessment and Plan: 1. Annual physical exam MAW already completed - Lipid panel - POCT urinalysis dipstick  2. Esophageal varices in cirrhosis (HCC) Continue metoprolol EGD in January - CBC with Differential/Platelet  3. Cirrhosis of liver with ascites, unspecified hepatic cirrhosis type ([place]) Stable weight with controlled edema - Comprehensive metabolic panel  4. Impaired glucose tolerance - TSH - Hemoglobin A1c  5. Post-menopausal atrophic vaginitis Continue estrogen  6. Muscle spasms of neck May continue low dose flexeril 10 mg - 1/2 tab at HS   No orders of the defined types were placed in this encounter.   Partially dictated using Editor, commissioning. Any errors are unintentional.  Halina Maidens, MD [facility]  11/18/2016

## 2017-01-01 DIAGNOSIS — M6798 Unspecified disorder of synovium and tendon, other site: Secondary | ICD-10-CM | POA: Insufficient documentation

## 2017-01-01 DIAGNOSIS — M1611 Unilateral primary osteoarthritis, right hip: Secondary | ICD-10-CM | POA: Diagnosis not present

## 2017-01-01 DIAGNOSIS — M7061 Trochanteric bursitis, right hip: Secondary | ICD-10-CM | POA: Diagnosis not present

## 2017-01-01 DIAGNOSIS — M67951 Unspecified disorder of synovium and tendon, right thigh: Secondary | ICD-10-CM | POA: Insufficient documentation

## 2017-01-01 HISTORY — DX: Unspecified disorder of synovium and tendon, right thigh: M67.951

## 2017-01-11 ENCOUNTER — Ambulatory Visit (INDEPENDENT_AMBULATORY_CARE_PROVIDER_SITE_OTHER): Payer: Medicare HMO

## 2017-01-11 DIAGNOSIS — S80812A Abrasion, left lower leg, initial encounter: Secondary | ICD-10-CM | POA: Diagnosis not present

## 2017-01-11 DIAGNOSIS — Z23 Encounter for immunization: Secondary | ICD-10-CM | POA: Diagnosis not present

## 2017-01-11 NOTE — Progress Notes (Signed)
Patient seen for abrasion from a dog [facility]) that belongs to patients daughter and is believed to be up to date on all shots. This happened at her home over 48 hours ago. Patient does not recall last Tetanus shot and was told by after hours nurse over [place] to make appointment to get Tdap Booster.  Left lower leg has abrasion that is approximately 3 inches in length and scattered near shin- with one small  puncture wound about 3 mm in diameter. The abrasion and puncture wound seem to be healing well and scabbed over with minimal swelling. There is no redness and no drainage or bleeding.  Patient does not complain of pain or fever. She has not had chills. She did report cleaning the site with Hydroperoxide and had it wrapped when it was bleeding.  I administered Tdap in Right arm. I  Offered appointment with Dr.Berglund and advised it is up to her. She did not feel she needed seen by doctor. I advised her to keep cleaning it as she is and reassured her that the wound seems to be healing well. I advised her that since it is scabbed over no need to bandage.  Patient aware that she needs to be seen immediately if she develops fever or has change in movement of foot as well as if she gets redness around site or draining of the abrasion. This was relayed to by Dr.Berglund as well as will be signed off by Dr.Berglund.

## 2017-01-11 NOTE — Patient Instructions (Signed)
Abrasion An abrasion is a cut or scrape on the outer surface of your skin. An abrasion does not extend through all of the layers of your skin. It is important to care for your abrasion properly to prevent infection. What are the causes? Most abrasions are caused by falling on or gliding across the ground or another surface. When your skin rubs on something, the outer and inner layer of skin rubs off. What are the signs or symptoms? A cut or scrape is the main symptom of this condition. The scrape may be bleeding, or it may appear red or pink. If there was an associated fall, there may be an underlying bruise. How is this diagnosed? An abrasion is diagnosed with a physical exam. How is this treated? Treatment for this condition depends on how large and deep the abrasion is. Usually, your abrasion will be cleaned with water and mild soap. This removes any dirt or debris that may be stuck. An antibiotic ointment may be applied to the abrasion to help prevent infection. A bandage (dressing) may be placed on the abrasion to keep it clean. You may also need a tetanus shot. Follow these instructions at home: Medicines   Take or apply medicines only as directed by your health care provider.  If you were prescribed an antibiotic ointment, finish all of it even if you start to feel better. Wound care   Clean the wound with mild soap and water 2-3 times per day or as directed by your health care provider. Pat your wound dry with a clean towel. Do not rub it.  There are many different ways to close and cover a wound. Follow instructions from your health care provider about:  Wound care.  Dressing changes and removal.  Check your wound every day for signs of infection. Watch for:  Redness, swelling, or pain.  Fluid, blood, or pus. General instructions    Keep the dressing dry as directed by your health care provider. Do not take baths, swim, use a hot tub, or do anything that would put your  wound underwater until your health care provider approves.  If there is swelling, raise (elevate) the injured area above the level of your heart while you are sitting or lying down.  Keep all follow-up visits as directed by your health care provider. This is important. Contact a health care provider if:  You received a tetanus shot and you have swelling, severe pain, redness, or bleeding at the injection site.  Your pain is not controlled with medicine.  You have increased redness, swelling, or pain at the site of your wound. Get help right away if:  You have a red streak going away from your wound.  You have a fever.  You have fluid, blood, or pus coming from your wound.  You notice a bad smell coming from your wound or your dressing. This information is not intended to replace advice given to you by your health care provider. Make sure you discuss any questions you have with your health care provider. Document Released: 11/12/2004 Document Revised: 10/04/2015 Document Reviewed: 01/31/2014 Elsevier Interactive Patient Education  2017 [employer]  

## 2017-01-27 DIAGNOSIS — M1611 Unilateral primary osteoarthritis, right hip: Secondary | ICD-10-CM | POA: Diagnosis not present

## 2017-01-27 DIAGNOSIS — M25551 Pain in right hip: Secondary | ICD-10-CM | POA: Diagnosis not present

## 2017-01-28 DIAGNOSIS — M7061 Trochanteric bursitis, right hip: Secondary | ICD-10-CM | POA: Diagnosis not present

## 2017-02-16 DIAGNOSIS — K2970 Gastritis, unspecified, without bleeding: Secondary | ICD-10-CM

## 2017-02-16 HISTORY — DX: Gastritis, unspecified, without bleeding: K29.70

## 2017-02-17 DIAGNOSIS — H903 Sensorineural hearing loss, bilateral: Secondary | ICD-10-CM | POA: Diagnosis not present

## 2017-03-19 ENCOUNTER — Telehealth: Payer: Self-pay

## 2017-03-19 NOTE — Telephone Encounter (Signed)
No, Medicare does not cover any hormone therapy.

## 2017-03-19 NOTE — Telephone Encounter (Signed)
Patient called stating she received a letter from insurance stating insurance no longer covers estradiol medication. Is there any other alternative to this medication?  Please Advise.

## 2017-03-19 NOTE — Telephone Encounter (Signed)
Tried calling patient 3 times. First two times line was busy. Last time phone kept ringing, and no VM.

## 2017-03-23 ENCOUNTER — Telehealth: Payer: Self-pay

## 2017-03-23 NOTE — Telephone Encounter (Signed)
Patient called today again about estradiol not being covered by insurance. Called patient back and was able to leave a VM this time informing her Medicare does not pay for hormone therapy. She will have to pay out of pocket for medication or she may have to stop taking the medication since there is no alternative.  Awaiting call back.

## 2017-04-08 ENCOUNTER — Ambulatory Visit (INDEPENDENT_AMBULATORY_CARE_PROVIDER_SITE_OTHER): Payer: Medicare HMO | Admitting: Internal Medicine

## 2017-04-08 ENCOUNTER — Encounter: Payer: Self-pay | Admitting: Internal Medicine

## 2017-04-08 VITALS — BP 130/70 | HR 78 | Temp 97.9°F | Ht 63.0 in | Wt 169.0 lb

## 2017-04-08 DIAGNOSIS — R188 Other ascites: Secondary | ICD-10-CM | POA: Diagnosis not present

## 2017-04-08 DIAGNOSIS — I8510 Secondary esophageal varices without bleeding: Secondary | ICD-10-CM | POA: Diagnosis not present

## 2017-04-08 DIAGNOSIS — K7460 Unspecified cirrhosis of liver: Secondary | ICD-10-CM | POA: Diagnosis not present

## 2017-04-08 DIAGNOSIS — J0100 Acute maxillary sinusitis, unspecified: Secondary | ICD-10-CM

## 2017-04-08 DIAGNOSIS — M7061 Trochanteric bursitis, right hip: Secondary | ICD-10-CM

## 2017-04-08 DIAGNOSIS — M62838 Other muscle spasm: Secondary | ICD-10-CM | POA: Diagnosis not present

## 2017-04-08 MED ORDER — AZITHROMYCIN 250 MG PO TABS: ORAL_TABLET | ORAL | 0 refills | Status: AC

## 2017-04-08 MED ORDER — CYCLOBENZAPRINE HCL 10 MG PO TABS: 10.0000 mg | ORAL_TABLET | Freq: Three times a day (TID) | ORAL | 0 refills | Status: DC | PRN

## 2017-04-08 NOTE — Patient Instructions (Signed)
Stop Allegra and start Claritin 10 mg once a day (loratidine)

## 2017-04-08 NOTE — Progress Notes (Signed)
Date:  04/08/2017   Name:  Jenna Franklin   DOB:  11/19/1940   MRN:  [id number]   Chief Complaint: Ear Pain (X 3 days- Right Ear. Hurts inside and outside and running down into neck and shoulder. )  Otalgia   There is pain in the right ear. This is a new problem. The problem occurs constantly. There has been no fever. Associated symptoms include rhinorrhea. Pertinent negatives include no coughing, diarrhea, ear discharge, neck pain or sore throat.  Neck Pain   This is a recurrent problem. The current episode started more than 1 month ago. The problem has been waxing and waning. The pain is present in the right side. The quality of the pain is described as cramping. The pain is mild. Pertinent negatives include no chest pain, fever or trouble swallowing. She has tried muscle relaxants for the symptoms. The treatment provided moderate relief.      Review of Systems  Constitutional: Negative for chills, fatigue and fever.  HENT: Positive for congestion, ear pain, rhinorrhea and sinus pressure. Negative for ear discharge, sore throat and trouble swallowing.   Respiratory: Negative for cough, chest tightness and wheezing.   Cardiovascular: Negative for chest pain, palpitations and leg swelling.  Gastrointestinal: Negative for abdominal distention and diarrhea.  Musculoskeletal: Positive for arthralgias (right hip pain), gait problem and neck stiffness. Negative for neck pain.    Patient Active Problem List   Diagnosis Date Noted  . Muscle spasms of neck 11/18/2016  . Esophageal varices in cirrhosis (HCC)   . Cirrhosis of liver with ascites ([place]) 05/29/2015  . Non-alcoholic fatty liver disease 05/29/2015  . Post-menopausal atrophic vaginitis 05/29/2015  . Acid reflux 03/25/2015  . Calculus of kidney 03/25/2015  . Impaired glucose tolerance 09/20/2014  . Adjustment disorder with anxious mood 10/05/2013  . Nerve root pain 12/11/2011  . Bursitis, trochanteric 12/11/2011    Prior  to Admission medications   Medication Sig Start Date End Date Taking? Authorizing Provider  atenolol (TENORMIN) 25 MG tablet Take 1 tablet (25 mg total) by mouth 2 (two) times daily. Reported on 03/25/2015 06/29/16  Yes Glean Hess, MD  Calcium Carbonate-Vitamin D (CALCIUM + D PO) Take by mouth daily. Reported on 06/07/2015   Yes [provider]  cyclobenzaprine (FLEXERIL) 10 MG tablet Take 1 tablet (10 mg total) by mouth 3 (three) times daily as needed for muscle spasms. 10/16/16  Yes Glean Hess, MD  estradiol (ESTRACE) 0.5 MG tablet TAKE ONE TABLET BY MOUTH ONCE DAILY 09/05/16  Yes Glean Hess, MD  fluticasone [facility]) 50 MCG/ACT nasal spray Place 2 sprays into both nostrils daily. 10/08/15  Yes Glean Hess, MD  furosemide (LASIX) 40 MG tablet TAKE (1) TABLET BY MOUTH EVERY DAY 06/29/16  Yes Glean Hess, MD  spironolactone (ALDACTONE) 50 MG tablet Take 1 tablet (50 mg total) by mouth daily. 10/16/16  Yes Glean Hess, MD  vitamin E 400 UNIT capsule Take 400 Units by mouth daily.   Yes [provider]    Allergies  Allergen Reactions  . Baclofen Anxiety  . Codeine Nausea And Vomiting  . Prednisone Anxiety    Past Surgical History:  Procedure Laterality Date  . ABDOMINAL HYSTERECTOMY    . APPENDECTOMY    . BREAST BIOPSY Right    neg needle bx  . COMBINED HYSTERECTOMY ABDOMINAL W/ A&P REPAIR / OOPHORECTOMY    . ESOPHAGOGASTRODUODENOSCOPY (EGD) WITH PROPOFOL N/A 06/17/2015   Procedure: ESOPHAGOGASTRODUODENOSCOPY (  EGD) WITH PROPOFOL;  Surgeon: Lucilla Lame, MD;  Location: [facility];  Service: Endoscopy;  Laterality: N/A;  . LAPAROSCOPIC CHOLECYSTECTOMY    . TONSILLECTOMY      Social History   Tobacco Use  . Smoking status: Never Smoker  . Smokeless tobacco: Never Used  Substance Use Topics  . Alcohol use: No    Alcohol/week: 0.0 oz  . Drug use: No     Medication list has been reviewed and updated.  PHQ 2/9 Scores  11/16/2016 10/16/2016 08/16/2015 06/13/2015  PHQ - 2 Score 0 0 0 0    Physical Exam  Constitutional: She is oriented to person, place, and time. She appears well-developed and well-nourished.  HENT:  Right Ear: External ear and ear canal normal. Tympanic membrane is retracted. Tympanic membrane is not erythematous. No middle ear effusion.  Left Ear: External ear and ear canal normal. Tympanic membrane is retracted. Tympanic membrane is not erythematous.  No middle ear effusion.  Nose: Right sinus exhibits maxillary sinus tenderness. Right sinus exhibits no frontal sinus tenderness. Left sinus exhibits maxillary sinus tenderness. Left sinus exhibits no frontal sinus tenderness.  Mouth/Throat: Uvula is midline and mucous membranes are normal. No oral lesions. No oropharyngeal exudate or posterior oropharyngeal erythema.  Neck: Muscular tenderness present. No neck rigidity. Decreased range of motion present. No thyromegaly present.  Cardiovascular: Normal rate, regular rhythm and normal heart sounds.  Pulmonary/Chest: Breath sounds normal. She has no wheezes. She has no rales.  Lymphadenopathy:    She has no cervical adenopathy.  Neurological: She is alert and oriented to person, place, and time.  Psychiatric: She has a normal mood and affect.    BP 130/70   Pulse 78   Temp 97.9 F (36.6 C) (Oral)   Ht 5\' 3"  (1.6 m)   Wt 169 lb (76.7 kg)   SpO2 95%   BMI 29.94 kg/m   Assessment and Plan: 1. Acute non-recurrent maxillary sinusitis Continue Flonase, change to Claritin - azithromycin (ZITHROMAX Z-PAK) 250 MG tablet; UAD  Dispense: 6 each; Refill: 0  2. Esophageal varices in cirrhosis (HCC) Stable, may be due for EGD in May  3. Cirrhosis of liver with ascites, unspecified hepatic cirrhosis type ([place]) Ascites controlled  4. Trochanteric bursitis of right hip Seen by Ortho - no surgery needed Continue ambulation with cane  5. Neck muscle spasm - cyclobenzaprine (FLEXERIL) 10 MG  tablet; Take 1 tablet (10 mg total) by mouth 3 (three) times daily as needed for muscle spasms.  Dispense: 30 tablet; Refill: 0   Meds ordered this encounter  Medications  . cyclobenzaprine (FLEXERIL) 10 MG tablet    Sig: Take 1 tablet (10 mg total) by mouth 3 (three) times daily as needed for muscle spasms.    Dispense:  30 tablet    Refill:  0  . azithromycin (ZITHROMAX Z-PAK) 250 MG tablet    Sig: UAD    Dispense:  6 each    Refill:  0    Partially dictated using Editor, commissioning. Any errors are unintentional.  Halina Maidens, MD [facility]  04/08/2017

## 2017-04-19 ENCOUNTER — Other Ambulatory Visit: Payer: Self-pay | Admitting: Internal Medicine

## 2017-04-19 DIAGNOSIS — I5032 Chronic diastolic (congestive) heart failure: Secondary | ICD-10-CM

## 2017-04-19 DIAGNOSIS — K7460 Unspecified cirrhosis of liver: Secondary | ICD-10-CM

## 2017-04-19 DIAGNOSIS — R188 Other ascites: Principal | ICD-10-CM

## 2017-05-19 ENCOUNTER — Ambulatory Visit (INDEPENDENT_AMBULATORY_CARE_PROVIDER_SITE_OTHER): Payer: Medicare HMO | Admitting: Internal Medicine

## 2017-05-19 ENCOUNTER — Encounter: Payer: Self-pay | Admitting: Internal Medicine

## 2017-05-19 ENCOUNTER — Other Ambulatory Visit
Admission: RE | Admit: 2017-05-19 | Discharge: 2017-05-19 | Disposition: A | Discharge status: Home / Self Care | Payer: Medicare HMO | Source: Ambulatory Visit | Attending: Internal Medicine | Admitting: Internal Medicine

## 2017-05-19 VITALS — BP 110/58 | HR 69 | Ht 63.0 in | Wt 172.0 lb

## 2017-05-19 DIAGNOSIS — I8510 Secondary esophageal varices without bleeding: Secondary | ICD-10-CM

## 2017-05-19 DIAGNOSIS — N952 Postmenopausal atrophic vaginitis: Secondary | ICD-10-CM

## 2017-05-19 DIAGNOSIS — M62838 Other muscle spasm: Secondary | ICD-10-CM | POA: Diagnosis not present

## 2017-05-19 DIAGNOSIS — Z1231 Encounter for screening mammogram for malignant neoplasm of breast: Secondary | ICD-10-CM | POA: Diagnosis not present

## 2017-05-19 DIAGNOSIS — R7302 Impaired glucose tolerance (oral): Secondary | ICD-10-CM | POA: Diagnosis not present

## 2017-05-19 DIAGNOSIS — K760 Fatty (change of) liver, not elsewhere classified: Secondary | ICD-10-CM

## 2017-05-19 DIAGNOSIS — K7460 Unspecified cirrhosis of liver: Secondary | ICD-10-CM | POA: Diagnosis not present

## 2017-05-19 DIAGNOSIS — R188 Other ascites: Secondary | ICD-10-CM

## 2017-05-19 LAB — COMPREHENSIVE METABOLIC PANEL
ALT: 39 U/L (ref 14–54)
AST: 40 U/L (ref 15–41)
Albumin: 4.2 g/dL (ref 3.5–5.0)
Alkaline Phosphatase: 86 U/L (ref 38–126)
Anion gap: 9 (ref 5–15)
BILIRUBIN TOTAL: 1 mg/dL (ref 0.3–1.2)
BUN: 18 mg/dL (ref 6–20)
CALCIUM: 9.5 mg/dL (ref 8.9–10.3)
CO2: 25 mmol/L (ref 22–32)
CREATININE: 0.9 mg/dL (ref 0.44–1.00)
Chloride: 103 mmol/L (ref 101–111)
GFR calc Af Amer: >60 mL/min (ref >60)
Glucose, Bld: 84 mg/dL (ref 65–99)
Potassium: 3.9 mmol/L (ref 3.5–5.1)
Sodium: 137 mmol/L (ref 135–145)
TOTAL PROTEIN: 8.3 g/dL — AB (ref 6.5–8.1)

## 2017-05-19 LAB — CBC WITH DIFFERENTIAL/PLATELET
BASOS ABS: 0 10*3/uL (ref 0–0.1)
Basophils Relative: 0 %
Eosinophils Absolute: 0.1 10*3/uL (ref 0–0.7)
Eosinophils Relative: 2 %
HEMATOCRIT: 43 % (ref 35.0–47.0)
Hemoglobin: 14.8 g/dL (ref 12.0–16.0)
LYMPHS PCT: 22 %
Lymphs Abs: 1.2 10*3/uL (ref 1.0–3.6)
MCH: 34.4 pg — ABNORMAL HIGH (ref 26.0–34.0)
MCHC: 34.4 g/dL (ref 32.0–36.0)
MCV: 100 fL (ref 80.0–100.0)
MONO ABS: 0.5 10*3/uL (ref 0.2–0.9)
Monocytes Relative: 9 %
NEUTROS ABS: 3.9 10*3/uL (ref 1.4–6.5)
Neutrophils Relative %: 67 %
Platelets: 106 10*3/uL — ABNORMAL LOW (ref 150–440)
RBC: 4.3 MIL/uL (ref 3.80–5.20)
RDW: 13.8 % (ref 11.5–14.5)
WBC: 5.8 10*3/uL (ref 3.6–11.0)

## 2017-05-19 LAB — HEMOGLOBIN A1C
HEMOGLOBIN A1C: 5.4 % (ref 4.8–5.6)
Mean Plasma Glucose: 108.28 mg/dL

## 2017-05-19 MED ORDER — SPIRONOLACTONE 50 MG PO TABS: 50.0000 mg | ORAL_TABLET | Freq: Every day | ORAL | 1 refills | Status: DC

## 2017-05-19 MED ORDER — CYCLOBENZAPRINE HCL 10 MG PO TABS: 10.0000 mg | ORAL_TABLET | Freq: Three times a day (TID) | ORAL | 2 refills | Status: DC | PRN

## 2017-05-19 MED ORDER — ESTRADIOL 0.5 MG PO TABS: 0.5000 mg | ORAL_TABLET | Freq: Every day | ORAL | 3 refills | Status: DC

## 2017-05-19 NOTE — Progress Notes (Signed)
Date:  05/19/2017   Name:  Jenna Franklin   DOB:  10-Feb-1941   MRN:  [id number]   Chief Complaint: Hypertension (Needs refills on cyclobenzaprine, estradiol, and spironolactone. ) Cirrhosis due to NAFLD with esophageal varices - has been diagnosed by GI; stable on current regimen of diuretics and beta blockers.  No episodes of bleeding, abdominal distention is minimal. She is due for 2 year EGD with Dr. Allen Norris.  Pre-diabetes - trying to follow a low carb diet, weight is stable.  Neck stiffness - having intermittent neck stiffness on the right side.  She has used flexeril intermittently and needs a refill.  Menopausal sx - needs estrogen - without it she cannot sleep due to hot flashes.  Review of Systems  Constitutional: Negative for appetite change, fatigue, fever and unexpected weight change.  HENT: Negative for tinnitus and trouble swallowing.   Eyes: Negative for visual disturbance.  Respiratory: Negative for cough, chest tightness and shortness of breath.   Cardiovascular: Negative for chest pain, palpitations and leg swelling.  Gastrointestinal: Negative for abdominal pain, blood in stool, constipation, diarrhea, nausea and vomiting.  Endocrine: Negative for polydipsia and polyuria.  Genitourinary: Negative for dysuria and hematuria.  Musculoskeletal: Positive for neck stiffness. Negative for arthralgias.  Neurological: Negative for tremors, numbness and headaches.  Psychiatric/Behavioral: Negative for dysphoric mood.    Patient Active Problem List   Diagnosis Date Noted  . Neck muscle spasm 11/18/2016  . Esophageal varices in cirrhosis (HCC)   . Cirrhosis of liver with ascites ([place]) 05/29/2015  . Non-alcoholic fatty liver disease 05/29/2015  . Post-menopausal atrophic vaginitis 05/29/2015  . Acid reflux 03/25/2015  . Calculus of kidney 03/25/2015  . Impaired glucose tolerance 09/20/2014  . Adjustment disorder with anxious mood 10/05/2013  . Nerve root pain 12/11/2011   . Bursitis, trochanteric 12/11/2011    Prior to Admission medications   Medication Sig Start Date End Date Taking? Authorizing Provider  atenolol (TENORMIN) 25 MG tablet TAKE (1) TABLET BY MOUTH TWICE DAILY 04/19/17   Glean Hess, MD  Calcium Carbonate-Vitamin D (CALCIUM + D PO) Take by mouth daily. Reported on 06/07/2015    [provider]  cyclobenzaprine (FLEXERIL) 10 MG tablet Take 1 tablet (10 mg total) by mouth 3 (three) times daily as needed for muscle spasms. 04/08/17   Glean Hess, MD  estradiol (ESTRACE) 0.5 MG tablet TAKE ONE TABLET BY MOUTH ONCE DAILY 09/05/16   Glean Hess, MD  fluticasone [facility]) 50 MCG/ACT nasal spray Place 2 sprays into both nostrils daily. 10/08/15   Glean Hess, MD  furosemide (LASIX) 40 MG tablet TAKE (1) TABLET BY MOUTH EVERY DAY 06/29/16   Glean Hess, MD  furosemide (LASIX) 40 MG tablet TAKE (1) TABLET BY MOUTH EVERY DAY 04/19/17   Glean Hess, MD  spironolactone (ALDACTONE) 50 MG tablet Take 1 tablet (50 mg total) by mouth daily. 10/16/16   Glean Hess, MD  vitamin E 400 UNIT capsule Take 400 Units by mouth daily.    [provider]    Allergies  Allergen Reactions  . Baclofen Anxiety  . Codeine Nausea And Vomiting  . Prednisone Anxiety    Past Surgical History:  Procedure Laterality Date  . ABDOMINAL HYSTERECTOMY    . APPENDECTOMY    . BREAST BIOPSY Right    neg needle bx  . COMBINED HYSTERECTOMY ABDOMINAL W/ A&P REPAIR / OOPHORECTOMY    . ESOPHAGOGASTRODUODENOSCOPY (EGD) WITH PROPOFOL N/A 06/17/2015  Procedure: ESOPHAGOGASTRODUODENOSCOPY (EGD) WITH PROPOFOL;  Surgeon: Lucilla Lame, MD;  Location: [place];  Service: Endoscopy;  Laterality: N/A;  . LAPAROSCOPIC CHOLECYSTECTOMY    . TONSILLECTOMY      Social History   Tobacco Use  . Smoking status: Never Smoker  . Smokeless tobacco: Never Used  Substance Use Topics  . Alcohol use: No    Alcohol/week: 0.0 oz  . Drug use:  No     Medication list has been reviewed and updated.  PHQ 2/9 Scores 11/16/2016 10/16/2016 08/16/2015 06/13/2015  PHQ - 2 Score 0 0 0 0    Physical Exam  Constitutional: She is oriented to person, place, and time. She appears well-developed. No distress.  HENT:  Head: Normocephalic and atraumatic.  Neck: Normal range of motion. Neck supple.  Cardiovascular: Normal rate, regular rhythm and normal heart sounds.  Pulmonary/Chest: Effort normal and breath sounds normal. No respiratory distress. She has no wheezes.  Abdominal: Soft. Bowel sounds are normal. She exhibits no distension, no fluid wave, no ascites and no mass. There is no tenderness. There is no rebound.  Musculoskeletal:       Cervical back: She exhibits decreased range of motion, tenderness and spasm (of right side of neck).  Neurological: She is alert and oriented to person, place, and time. She has normal strength and normal reflexes.  Skin: Skin is warm and dry. No rash noted.  Psychiatric: She has a normal mood and affect. Her behavior is normal. Thought content normal.  Nursing note and vitals reviewed.   BP (!) 110/58   Pulse 69   Ht 5\' 3"  (1.6 m)   Wt 172 lb (78 kg)   SpO2 97%   BMI 30.47 kg/m   Assessment and Plan: 1. Esophageal varices in cirrhosis [facility]) Doing well - refer back to GI for EGD - CBC with Differential/Platelet - Comprehensive metabolic panel - Ambulatory referral to Gastroenterology  2. Cirrhosis of liver with ascites, unspecified hepatic cirrhosis type (HCC) Continue current medications - spironolactone (ALDACTONE) 50 MG tablet; Take 1 tablet (50 mg total) by mouth daily.  Dispense: 90 tablet; Refill: 1  3. Non-alcoholic fatty liver disease stable  4. Impaired glucose tolerance - Hemoglobin A1c  5. Neck muscle spasm Continue heat, refill flexeril - cyclobenzaprine (FLEXERIL) 10 MG tablet; Take 1 tablet (10 mg total) by mouth 3 (three) times daily as needed for muscle spasms.   Dispense: 30 tablet; Refill: 2  6. Post-menopausal atrophic vaginitis - estradiol (ESTRACE) 0.5 MG tablet; Take 1 tablet (0.5 mg total) by mouth daily.  Dispense: 90 tablet; Refill: 3  7. Encounter for screening mammogram for breast cancer - MM DIGITAL SCREENING BILATERAL; Future   Meds ordered this encounter  Medications  . spironolactone (ALDACTONE) 50 MG tablet    Sig: Take 1 tablet (50 mg total) by mouth daily.    Dispense:  90 tablet    Refill:  1  . cyclobenzaprine (FLEXERIL) 10 MG tablet    Sig: Take 1 tablet (10 mg total) by mouth 3 (three) times daily as needed for muscle spasms.    Dispense:  30 tablet    Refill:  2  . estradiol (ESTRACE) 0.5 MG tablet    Sig: Take 1 tablet (0.5 mg total) by mouth daily.    Dispense:  90 tablet    Refill:  3    Partially dictated using Editor, commissioning. Any errors are unintentional.  Halina Maidens, MD [facility]  05/19/2017

## 2017-06-10 ENCOUNTER — Other Ambulatory Visit: Payer: Self-pay

## 2017-06-10 ENCOUNTER — Encounter: Payer: Self-pay | Admitting: Gastroenterology

## 2017-06-10 ENCOUNTER — Ambulatory Visit: Payer: Medicare HMO | Admitting: Gastroenterology

## 2017-06-10 ENCOUNTER — Other Ambulatory Visit
Admission: RE | Admit: 2017-06-10 | Discharge: 2017-06-10 | Disposition: A | Discharge status: Home / Self Care | Payer: Medicare HMO | Source: Ambulatory Visit | Attending: Gastroenterology | Admitting: Gastroenterology

## 2017-06-10 VITALS — BP 134/58 | HR 71 | Ht 63.0 in | Wt 161.0 lb

## 2017-06-10 DIAGNOSIS — R188 Other ascites: Secondary | ICD-10-CM

## 2017-06-10 DIAGNOSIS — K7460 Unspecified cirrhosis of liver: Secondary | ICD-10-CM | POA: Insufficient documentation

## 2017-06-10 DIAGNOSIS — K219 Gastro-esophageal reflux disease without esophagitis: Secondary | ICD-10-CM | POA: Diagnosis not present

## 2017-06-10 DIAGNOSIS — K7031 Alcoholic cirrhosis of liver with ascites: Secondary | ICD-10-CM

## 2017-06-10 MED ORDER — SPIRONOLACTONE 25 MG PO TABS: 25.0000 mg | ORAL_TABLET | Freq: Every day | ORAL | 1 refills | Status: DC

## 2017-06-10 NOTE — Progress Notes (Signed)
Primary Care Physician: Glean Hess, MD  Primary Gastroenterologist:  Dr. Lucilla Lame  Chief Complaint  Patient presents with  . Gastroesophageal Reflux    referral from Dr. Freda Munro, MD for EGD-esophageal varicies    HPI: Jenna Franklin is a 77 y.o. female here for a history of cirrhosis.  The patient states she has lost weight by modifying her diet.  The patient has been doing well recently and was seen by her primary care physician.  She now reports that her main concern is that she is waking up in the middle of night to urinate.  The patient is on Aldactone 5 back to the 0 mg every morning and Lasix 40 mg every morning.  The patient's ascites has been well controlled.  She has been avoiding red meat due to the iron content and she has also been avoiding salt.  She states that she feels well at this time.  The patient's last EGD was in 2017 and she has not come in for hepatocellular carcinoma surveillance for some time.  There is no report of any abdominal pain nausea vomiting fevers chills black stools or bloody stools.  The patient has been found to have inflammation in her hip and was told at [facility] to take anti-inflammatory medications and has been taking Motrin.  Current Outpatient Medications  Medication Sig Dispense Refill  . atenolol (TENORMIN) 25 MG tablet TAKE (1) TABLET BY MOUTH TWICE DAILY 180 tablet 3  . Calcium Carbonate-Vitamin D (CALCIUM + D PO) Take by mouth daily. Reported on 06/07/2015    . cyclobenzaprine (FLEXERIL) 10 MG tablet Take 1 tablet (10 mg total) by mouth 3 (three) times daily as needed for muscle spasms. 30 tablet 2  . estradiol (ESTRACE) 0.5 MG tablet Take 1 tablet (0.5 mg total) by mouth daily. 90 tablet 3  . fluticasone (FLONASE) 50 MCG/ACT nasal spray Place 2 sprays into both nostrils daily. 16 g 2  . furosemide (LASIX) 40 MG tablet TAKE (1) TABLET BY MOUTH EVERY DAY 90 tablet 3  . ibuprofen (ADVIL,MOTRIN) 200 MG tablet Take 200 mg by mouth 2  (two) times daily.    Marland Kitchen spironolactone (ALDACTONE) 50 MG tablet Take 1 tablet (50 mg total) by mouth daily. 90 tablet 1  . vitamin E 400 UNIT capsule Take 400 Units by mouth daily.     No current facility-administered medications for this visit.     Allergies as of 06/10/2017 - Review Complete 06/10/2017  Allergen Reaction Noted  . Baclofen Anxiety 02/18/2016  . Codeine Nausea And Vomiting 09/28/2013  . Prednisone Anxiety 11/16/2016    ROS:  General: Negative for anorexia, weight loss, fever, chills, fatigue, weakness. ENT: Negative for hoarseness, difficulty swallowing , nasal congestion. CV: Negative for chest pain, angina, palpitations, dyspnea on exertion, peripheral edema.  Respiratory: Negative for dyspnea at rest, dyspnea on exertion, cough, sputum, wheezing.  GI: See history of present illness. GU:  Negative for dysuria, hematuria, urinary incontinence, urinary frequency, nocturnal urination.  Endo: Negative for unusual weight change.    Physical Examination:   BP (!) 134/58   Pulse 71   Ht 5\' 3"  (1.6 m)   Wt 161 lb (73 kg)   BMI 28.52 kg/m   General: Well-nourished, well-developed in no acute distress.  Eyes: No icterus. Conjunctivae pink. Mouth: Oropharyngeal mucosa moist and pink , no lesions erythema or exudate. Lungs: Clear to auscultation bilaterally. Non-labored. Heart: Regular rate and rhythm, no murmurs rubs or gallops.  Abdomen: Bowel sounds are normal, nontender, nondistended, no hepatosplenomegaly or masses, no abdominal bruits or hernia , no rebound or guarding.   Extremities: No lower extremity edema. No clubbing or deformities. Neuro: Alert and oriented x 3.  Grossly intact. Skin: Warm and dry, no jaundice.   Psych: Alert and cooperative, normal mood and affect.  Labs:    Imaging Studies: No results found.  Assessment and Plan:   Jenna Franklin is a 77 y.o. y/o female who comes in today with a history of cirrhosis.  The patient will be set  up for a right upper quadrant ultrasound to rule out hepatocellular carcinoma.  She will also have her blood scheduled for alpha-fetoprotein.  The patient has been told to avoid NSAIDs due to the risk of hepatorenal syndrome.  The patient will have her Aldactone decreased to 25 mg a day to try and avoid her from having to urinate at night so frequently.  The patient will also follow-up every 6 months for [facility] surveillance.  The patient will also be set up for an upper endoscopy since it has been 2 years since her last upper endoscopy to rule out esophageal varices.  The patient has been explained the plan and agrees with it.    Lucilla Lame, MD. Mar[facility] Regal   Note: This dictation was prepared with Dragon dictation along with smaller phrase technology. Any transcriptional errors that result from this process are unintentional.

## 2017-06-10 NOTE — Patient Instructions (Signed)
ULTRASOUND-06/14/2017                            TIME: 10:15 am                             ARRIVAL TIME: 10:00 am                          **NOTHING TO EAT OR DRINK AFTER MIDNIGHT**  EGD-06/17/2017          They will contact you for time.            [facility].  LAB ORDERED: AFP, LAB SLIP GIVEN.

## 2017-06-11 LAB — AFP TUMOR MARKER: AFP, Serum, Tumor Marker: 5.6 ng/mL (ref 0.0–8.3)

## 2017-06-14 ENCOUNTER — Ambulatory Visit
Admission: RE | Admit: 2017-06-14 | Discharge: 2017-06-14 | Disposition: A | Discharge status: Home / Self Care | Payer: Medicare HMO | Source: Ambulatory Visit | Attending: Gastroenterology | Admitting: Gastroenterology

## 2017-06-14 DIAGNOSIS — K7031 Alcoholic cirrhosis of liver with ascites: Secondary | ICD-10-CM | POA: Diagnosis not present

## 2017-06-14 DIAGNOSIS — R69 Illness, unspecified: Secondary | ICD-10-CM | POA: Diagnosis not present

## 2017-06-16 NOTE — Discharge Instructions (Signed)
General Anesthesia, Adult, Care After °These instructions provide you with information about caring for yourself after your procedure. Your health care provider may also give you more specific instructions. Your treatment has been planned according to current medical practices, but problems sometimes occur. Call your health care provider if you have any problems or questions after your procedure. °What can I expect after the procedure? °After the procedure, it is common to have: °· Vomiting. °· A sore throat. °· Mental slowness. ° °It is common to feel: °· Nauseous. °· Cold or shivery. °· Sleepy. °· Tired. °· Sore or achy, even in parts of your body where you did not have surgery. ° °Follow these instructions at home: °For at least 24 hours after the procedure: °· Do not: °? Participate in activities where you could fall or become injured. °? Drive. °? Use heavy machinery. °? Drink alcohol. °? Take sleeping pills or medicines that cause drowsiness. °? Make important decisions or sign legal documents. °? Take care of children on your own. °· Rest. °Eating and drinking °· If you vomit, drink water, juice, or soup when you can drink without vomiting. °· Drink enough fluid to keep your urine clear or pale yellow. °· Make sure you have little or no nausea before eating solid foods. °· Follow the diet recommended by your health care provider. °General instructions °· Have a responsible adult stay with you until you are awake and alert. °· Return to your normal activities as told by your health care provider. Ask your health care provider what activities are safe for you. °· Take over-the-counter and prescription medicines only as told by your health care provider. °· If you smoke, do not smoke without supervision. °· Keep all follow-up visits as told by your health care provider. This is important. °Contact a health care provider if: °· You continue to have nausea or vomiting at home, and medicines are not helpful. °· You  cannot drink fluids or start eating again. °· You cannot urinate after 8-12 hours. °· You develop a skin rash. °· You have fever. °· You have increasing redness at the site of your procedure. °Get help right away if: °· You have difficulty breathing. °· You have chest pain. °· You have unexpected bleeding. °· You feel that you are having a life-threatening or urgent problem. °This information is not intended to replace advice given to you by your health care provider. Make sure you discuss any questions you have with your health care provider. °Document Released: 05/11/2000 Document Revised: 07/08/2015 Document Reviewed: 01/17/2015 °Elsevier Interactive Patient Education © 2018 [employer] Inc[employer] ° °

## 2017-06-17 ENCOUNTER — Encounter
Admission: RE | Disposition: A | Discharge status: Home / Self Care | Payer: Self-pay | Source: Ambulatory Visit | Attending: Gastroenterology

## 2017-06-17 ENCOUNTER — Telehealth: Payer: Self-pay

## 2017-06-17 ENCOUNTER — Ambulatory Visit
Admission: RE | Admit: 2017-06-17 | Discharge: 2017-06-17 | Disposition: A | Discharge status: Home / Self Care | Payer: Medicare HMO | Source: Ambulatory Visit | Attending: Gastroenterology | Admitting: Gastroenterology

## 2017-06-17 ENCOUNTER — Ambulatory Visit: Payer: Medicare HMO | Admitting: Anesthesiology

## 2017-06-17 DIAGNOSIS — I110 Hypertensive heart disease with heart failure: Secondary | ICD-10-CM | POA: Insufficient documentation

## 2017-06-17 DIAGNOSIS — J45909 Unspecified asthma, uncomplicated: Secondary | ICD-10-CM | POA: Insufficient documentation

## 2017-06-17 DIAGNOSIS — Z888 Allergy status to other drugs, medicaments and biological substances status: Secondary | ICD-10-CM | POA: Insufficient documentation

## 2017-06-17 DIAGNOSIS — K2970 Gastritis, unspecified, without bleeding: Secondary | ICD-10-CM | POA: Diagnosis not present

## 2017-06-17 DIAGNOSIS — Z885 Allergy status to narcotic agent status: Secondary | ICD-10-CM | POA: Diagnosis not present

## 2017-06-17 DIAGNOSIS — I8510 Secondary esophageal varices without bleeding: Secondary | ICD-10-CM | POA: Diagnosis not present

## 2017-06-17 DIAGNOSIS — K219 Gastro-esophageal reflux disease without esophagitis: Secondary | ICD-10-CM | POA: Diagnosis not present

## 2017-06-17 DIAGNOSIS — Z8249 Family history of ischemic heart disease and other diseases of the circulatory system: Secondary | ICD-10-CM | POA: Insufficient documentation

## 2017-06-17 DIAGNOSIS — Z79899 Other long term (current) drug therapy: Secondary | ICD-10-CM | POA: Insufficient documentation

## 2017-06-17 DIAGNOSIS — I509 Heart failure, unspecified: Secondary | ICD-10-CM | POA: Diagnosis not present

## 2017-06-17 DIAGNOSIS — M8580 Other specified disorders of bone density and structure, unspecified site: Secondary | ICD-10-CM | POA: Insufficient documentation

## 2017-06-17 DIAGNOSIS — E559 Vitamin D deficiency, unspecified: Secondary | ICD-10-CM | POA: Insufficient documentation

## 2017-06-17 DIAGNOSIS — K7460 Unspecified cirrhosis of liver: Secondary | ICD-10-CM | POA: Insufficient documentation

## 2017-06-17 HISTORY — PX: ESOPHAGOGASTRODUODENOSCOPY (EGD) WITH PROPOFOL: SHX5813

## 2017-06-17 SURGERY — ESOPHAGOGASTRODUODENOSCOPY (EGD) WITH PROPOFOL
Anesthesia: General | Site: Throat | Wound class: Clean Contaminated

## 2017-06-17 MED ORDER — SIMETHICONE 40 MG/0.6ML PO SUSP
ORAL | Status: DC | PRN
  Administered 2017-06-17: .5 mL

## 2017-06-17 MED ORDER — SODIUM CHLORIDE 0.9 % IV SOLN: INTRAVENOUS | Status: DC

## 2017-06-17 MED ORDER — LACTATED RINGERS IV SOLN
INTRAVENOUS | Status: DC
  Administered 2017-06-17: 09:00:00 via INTRAVENOUS

## 2017-06-17 MED ORDER — NADOLOL 40 MG PO TABS: 40.0000 mg | ORAL_TABLET | Freq: Every day | ORAL | 11 refills | Status: DC

## 2017-06-17 MED ORDER — LIDOCAINE HCL (CARDIAC) PF 100 MG/5ML IV SOSY
PREFILLED_SYRINGE | INTRAVENOUS | Status: DC | PRN
  Administered 2017-06-17: 40 mg via INTRAVENOUS

## 2017-06-17 MED ORDER — GLYCOPYRROLATE 0.2 MG/ML IJ SOLN
INTRAMUSCULAR | Status: DC | PRN
  Administered 2017-06-17: 0.2 mg via INTRAVENOUS

## 2017-06-17 MED ORDER — PROPOFOL 10 MG/ML IV BOLUS
INTRAVENOUS | Status: DC | PRN
  Administered 2017-06-17: 20 mg via INTRAVENOUS
  Administered 2017-06-17: 30 mg via INTRAVENOUS
  Administered 2017-06-17: 40 mg via INTRAVENOUS
  Administered 2017-06-17: 30 mg via INTRAVENOUS
  Administered 2017-06-17: 80 mg via INTRAVENOUS

## 2017-06-17 MED ORDER — DEXMEDETOMIDINE HCL 200 MCG/2ML IV SOLN
INTRAVENOUS | Status: DC | PRN
  Administered 2017-06-17: 4 ug via INTRAVENOUS

## 2017-06-17 SURGICAL SUPPLY — 6 items
BLOCK BITE 60FR ADLT L/F GRN (MISCELLANEOUS) ×2 IMPLANT
CANISTER SUCT 1200ML W/VALVE (MISCELLANEOUS) ×2 IMPLANT
GOWN CVR UNV OPN BCK APRN NK (MISCELLANEOUS) ×2 IMPLANT
GOWN ISOL THUMB LOOP REG UNIV (MISCELLANEOUS) ×2
KIT ENDO PROCEDURE OLY (KITS) ×2 IMPLANT
WATER STERILE IRR 250ML POUR (IV SOLUTION) ×2 IMPLANT

## 2017-06-17 NOTE — Telephone Encounter (Signed)
LVM for pt to return my call.

## 2017-06-17 NOTE — Telephone Encounter (Signed)
-----   Message from Lucilla Lame, MD sent at 06/15/2017  7:44 AM EDT ----- Let the patient know that the ultrasound did not show any worrisome features such as masses or cancers.  It was consistent with her known history of cirrhosis.

## 2017-06-17 NOTE — Transfer of Care (Signed)
Immediate Anesthesia Transfer of Care Note  Patient: Jenna Franklin  Procedure(s) Performed: ESOPHAGOGASTRODUODENOSCOPY (EGD) WITH PROPOFOL (N/A Throat)  Patient Location: PACU  Anesthesia Type: General  Level of Consciousness: awake, alert  and patient cooperative  Airway and Oxygen Therapy: Patient Spontanous Breathing and Patient connected to supplemental oxygen  Post-op Assessment: Post-op Vital signs reviewed, Patient's Cardiovascular Status Stable, Respiratory Function Stable, Patent Airway and No signs of Nausea or vomiting  Post-op Vital Signs: Reviewed and stable  Complications: No apparent anesthesia complications

## 2017-06-17 NOTE — Anesthesia Postprocedure Evaluation (Signed)
Anesthesia Post Note  Patient: Annaliese Saez Eve  Procedure(s) Performed: ESOPHAGOGASTRODUODENOSCOPY (EGD) WITH PROPOFOL (N/A Throat)  Patient location during evaluation: PACU Anesthesia Type: General Level of consciousness: awake and alert and oriented Pain management: satisfactory to patient Vital Signs Assessment: post-procedure vital signs reviewed and stable Respiratory status: spontaneous breathing, nonlabored ventilation and respiratory function stable Cardiovascular status: blood pressure returned to baseline and stable Postop Assessment: Adequate PO intake and No signs of nausea or vomiting Anesthetic complications: no    Raliegh Ip

## 2017-06-17 NOTE — H&P (Signed)
Lucilla Lame, MD [facility] [address], [place], [place] [address] [phone] Fax : [phone]  Primary Care Physician:  Glean Hess, MD Primary Gastroenterologist:  Dr. Allen Norris  Pre-Procedure History & Physical: HPI:  Jenna Franklin is a 77 y.o. female is here for an endoscopy.   Past Medical History:  Diagnosis Date  . Anemia   . Asthma   . Bronchitis    currently  . CHF (congestive heart failure) ([place])    pt says she thinks she has this  . Cirrhosis of liver ([place])   . GERD (gastroesophageal reflux disease)   . H/O: depression   . Hypertension   . Non-alcoholic fatty liver disease   . Nonalcoholic fatty liver disease   . Osteopenia   . Osteopenia   . Reflux   . Reflux   . Shortness of breath dyspnea   . Vitamin D deficiency   . Vitamin D deficiency     Past Surgical History:  Procedure Laterality Date  . ABDOMINAL HYSTERECTOMY    . APPENDECTOMY    . BREAST BIOPSY Right    neg needle bx  . COMBINED HYSTERECTOMY ABDOMINAL W/ A&P REPAIR / OOPHORECTOMY    . ESOPHAGOGASTRODUODENOSCOPY (EGD) WITH PROPOFOL N/A 06/17/2015   Procedure: ESOPHAGOGASTRODUODENOSCOPY (EGD) WITH PROPOFOL;  Surgeon: Lucilla Lame, MD;  Location: [place];  Service: Endoscopy;  Laterality: N/A;  . LAPAROSCOPIC CHOLECYSTECTOMY    . TONSILLECTOMY      Prior to Admission medications   Medication Sig Start Date End Date Taking? Authorizing Provider  atenolol (TENORMIN) 25 MG tablet TAKE (1) TABLET BY MOUTH TWICE DAILY 04/19/17   Glean Hess, MD  Calcium Carbonate-Vitamin D (CALCIUM + D PO) Take by mouth daily. Reported on 06/07/2015    [provider]  cyclobenzaprine (FLEXERIL) 10 MG tablet Take 1 tablet (10 mg total) by mouth 3 (three) times daily as needed for muscle spasms. 05/19/17   Glean Hess, MD  estradiol (ESTRACE) 0.5 MG tablet Take 1 tablet (0.5 mg total) by mouth daily. 05/19/17   Glean Hess, MD  fluticasone (FLONASE) 50 MCG/ACT nasal  spray Place 2 sprays into both nostrils daily. 10/08/15   Glean Hess, MD  furosemide (LASIX) 40 MG tablet TAKE (1) TABLET BY MOUTH EVERY DAY 04/19/17   Glean Hess, MD  ibuprofen (ADVIL,MOTRIN) 200 MG tablet Take 200 mg by mouth 2 (two) times daily.    [provider]  spironolactone (ALDACTONE) 25 MG tablet Take 1 tablet (25 mg total) by mouth daily. 06/10/17   Lucilla Lame, MD  vitamin E 400 UNIT capsule Take 400 Units by mouth daily.    [provider]    Allergies as of 06/10/2017 - Review Complete 06/10/2017  Allergen Reaction Noted  . Baclofen Anxiety 02/18/2016  . Codeine Nausea And Vomiting 09/28/2013  . Prednisone Anxiety 11/16/2016    Family History  Problem Relation Age of Onset  . Heart disease Mother   . Heart disease Father   . Heart attack Paternal Grandfather   . Asthma Son 39  . Hypertension Daughter   . Breast cancer Neg Hx     Social History   Socioeconomic History  . Marital status: Widowed    Spouse name: Not on file  . Number of children: Not on file  . Years of education: Not on file  . Highest education level: Not on file  Occupational History  . Not on file  Social Needs  .  Financial resource strain: Not on file  . Food insecurity:    Worry: Not on file    Inability: Not on file  . Transportation needs:    Medical: Not on file    Non-medical: Not on file  Tobacco Use  . Smoking status: Never Smoker  . Smokeless tobacco: Never Used  Substance and Sexual Activity  . Alcohol use: No    Alcohol/week: 0.0 oz  . Drug use: No  . Sexual activity: Not on file  Lifestyle  . Physical activity:    Days per week: Not on file    Minutes per session: Not on file  . Stress: Not on file  Relationships  . Social connections:    Talks on phone: Not on file    Gets together: Not on file    Attends religious service: Not on file    Active member of club or organization: Not on file    Attends meetings of clubs or  organizations: Not on file    Relationship status: Not on file  . Intimate partner violence:    Fear of current or ex partner: Not on file    Emotionally abused: Not on file    Physically abused: Not on file    Forced sexual activity: Not on file  Other Topics Concern  . Not on file  Social History Narrative  . Not on file    Review of Systems: See HPI, otherwise negative ROS  Physical Exam: Ht 5\' 3"  (1.6 m)   Wt 161 lb (73 kg)   BMI 28.52 kg/m  General:   Alert,  pleasant and cooperative in NAD Head:  Normocephalic and atraumatic. Neck:  Supple; no masses or thyromegaly. Lungs:  Clear throughout to auscultation.    Heart:  Regular rate and rhythm. Abdomen:  Soft, nontender and nondistended. Normal bowel sounds, without guarding, and without rebound.   Neurologic:  Alert and  oriented x4;  grossly normal neurologically.  Impression/Plan: Jenna Franklin is here for an endoscopy to be performed for cirrhosis  Risks, benefits, limitations, and alternatives regarding  endoscopy have been reviewed with the patient.  Questions have been answered.  All parties agreeable.   Lucilla Lame, MD  06/17/2017, 8:23 AM

## 2017-06-17 NOTE — Op Note (Signed)
[facility] Gastroenterology Patient Name: Jenna Franklin Procedure Date: 06/17/2017 8:58 AM MRN: [id number] Account #: [id number] Date of Birth: [birth year] Admit Type: Outpatient Age: 77 Room: [facility] OR ROOM 01 Gender: Female Note Status: Finalized Procedure:            Upper GI endoscopy Indications:          Cirrhosis with UGI bleeding rule out esophageal varices Providers:            Lucilla Lame MD, MD Referring MD:         Halina Maidens, MD (Referring MD) Medicines:            Propofol per Anesthesia Complications:        No immediate complications. Procedure:            Pre-Anesthesia Assessment:                       - Prior to the procedure, a History and Physical was                        performed, and patient medications and allergies were                        reviewed. The patient's tolerance of previous                        anesthesia was also reviewed. The risks and benefits of                        the procedure and the sedation options and risks were                        discussed with the patient. All questions were                        answered, and informed consent was obtained. Prior                        Anticoagulants: The patient has taken no previous                        anticoagulant or antiplatelet agents. ASA Grade                        Assessment: II - A patient with mild systemic disease.                        After reviewing the risks and benefits, the patient was                        deemed in satisfactory condition to undergo the                        procedure.                       After obtaining informed consent, the endoscope was                        passed under direct vision. Throughout the procedure,  the patient's blood pressure, pulse, and oxygen                        saturations were monitored continuously. The Olympus                        GIF H180J Endoscope (S#: [id number]) was  introduced                        through the mouth, and advanced to the second part of                        duodenum. The upper GI endoscopy was accomplished                        without difficulty. The patient tolerated the procedure                        well. Findings:      Grade I varices were found in the lower third of the esophagus,. No red       wale signs were present.      Diffuse mild inflammation characterized by erythema was found in the       entire examined stomach.      The examined duodenum was normal. Impression:           - Grade I esophageal varices.                       - Gastritis.                       - Normal examined duodenum.                       - No specimens collected. Recommendation:       - Discharge patient to home.                       - Resume previous diet.                       - Continue present medications.                       - Nadolol 20mg  qd Procedure Code(s):    --- Professional ---                       [phone], Esophagogastroduodenoscopy, flexible, transoral;                        diagnostic, including collection of specimen(s) by                        brushing or washing, when performed (separate procedure) Diagnosis Code(s):    --- Professional ---                       K29.70, Gastritis, unspecified, without bleeding                       I85.10, Secondary esophageal varices without bleeding CPT copyright 2017 American Medical Association. All rights reserved. The codes documented in this  report are preliminary and upon coder review may  be revised to meet current compliance requirements. Lucilla Lame MD, MD 06/17/2017 9:26:16 AM This report has been signed electronically. Number of Addenda: 0 Note Initiated On: 06/17/2017 8:58 AM      [facility]

## 2017-06-17 NOTE — Telephone Encounter (Signed)
-----   Message from Lucilla Lame, MD sent at 06/11/2017  8:42 AM EDT ----- Let the patient know the AFP is normal.

## 2017-06-17 NOTE — Anesthesia Preprocedure Evaluation (Signed)
Anesthesia Evaluation  Patient identified by MRN, date of birth, ID band Patient awake    Reviewed: Allergy & Precautions, H&P , NPO status , Patient's Chart, lab work & pertinent test results  Airway Mallampati: II  TM Distance: >3 FB Neck ROM: full    Dental no notable dental hx.    Pulmonary asthma ,    Pulmonary exam normal breath sounds clear to auscultation       Cardiovascular hypertension, (-) CHF Normal cardiovascular exam Rhythm:regular Rate:Normal     Neuro/Psych    GI/Hepatic GERD  ,(+) Cirrhosis   Esophageal Varices and ascites    ,   Endo/Other    Renal/GU      Musculoskeletal   Abdominal   Peds  Hematology   Anesthesia Other Findings   Reproductive/Obstetrics                             Anesthesia Physical Anesthesia Plan  ASA: III  Anesthesia Plan: General   Post-op Pain Management:    Induction: Intravenous  PONV Risk Score and Plan: 3 and Propofol infusion and Treatment may vary due to age or medical condition  Airway Management Planned: Natural Airway  Additional Equipment:   Intra-op Plan:   Post-operative Plan:   Informed Consent: I have reviewed the patients History and Physical, chart, labs and discussed the procedure including the risks, benefits and alternatives for the proposed anesthesia with the patient or authorized representative who has indicated his/her understanding and acceptance.     Plan Discussed with: CRNA  Anesthesia Plan Comments:         Anesthesia Quick Evaluation

## 2017-06-17 NOTE — Anesthesia Procedure Notes (Addendum)
Procedure Name: MAC Date/Time: 06/17/2017 9:10 AM Performed by: Janna Arch, CRNA Pre-anesthesia Checklist: Patient identified, Emergency Drugs available, Suction available and Patient being monitored Patient Re-evaluated:Patient Re-evaluated prior to induction Oxygen Delivery Method: Nasal cannula

## 2017-06-18 ENCOUNTER — Encounter: Payer: Self-pay | Admitting: Gastroenterology

## 2017-06-21 ENCOUNTER — Other Ambulatory Visit: Payer: Self-pay

## 2017-06-21 ENCOUNTER — Telehealth: Payer: Self-pay | Admitting: Gastroenterology

## 2017-06-21 MED ORDER — NADOLOL 20 MG PO TABS: 20.0000 mg | ORAL_TABLET | Freq: Every day | ORAL | 5 refills | Status: DC

## 2017-06-21 NOTE — Telephone Encounter (Signed)
Pt notified of US and lab results.  

## 2017-06-21 NOTE — Telephone Encounter (Signed)
Pt left vm she had endoscopy 05/02 and Dr. Allen Norris perscripted    rx nadolol 20 mg  She states the pharamcy gave her 30 mg and she is confused which one is correct please call pt so she can start  RX

## 2017-06-21 NOTE — Telephone Encounter (Signed)
Spoke with pt and confirmed she was suppose to be on Nadalol 20mg  daily. Rx has been sent to [facility] in [place].

## 2017-06-22 ENCOUNTER — Telehealth: Payer: Self-pay | Admitting: Gastroenterology

## 2017-06-22 NOTE — Telephone Encounter (Signed)
Daughter has questions on her new Rx   nadalol 20 mg. Please call daughter.

## 2017-06-22 NOTE — Telephone Encounter (Signed)
Discussed with pt's daughter the rx for Nadalol. They are worried about her BP getting too low while taking this with Atenolol. I have advised her to contact her PCP to discuss this.

## 2017-07-06 ENCOUNTER — Encounter: Payer: Self-pay | Admitting: Internal Medicine

## 2017-07-06 ENCOUNTER — Ambulatory Visit (INDEPENDENT_AMBULATORY_CARE_PROVIDER_SITE_OTHER): Payer: Medicare HMO | Admitting: Internal Medicine

## 2017-07-06 VITALS — BP 108/74 | HR 57 | Resp 16 | Ht 63.0 in | Wt 171.0 lb

## 2017-07-06 DIAGNOSIS — Z8679 Personal history of other diseases of the circulatory system: Secondary | ICD-10-CM

## 2017-07-06 DIAGNOSIS — I8510 Secondary esophageal varices without bleeding: Secondary | ICD-10-CM | POA: Diagnosis not present

## 2017-07-06 DIAGNOSIS — R188 Other ascites: Secondary | ICD-10-CM | POA: Diagnosis not present

## 2017-07-06 DIAGNOSIS — K7460 Unspecified cirrhosis of liver: Secondary | ICD-10-CM | POA: Diagnosis not present

## 2017-07-06 HISTORY — DX: Personal history of other diseases of the circulatory system: Z86.79

## 2017-07-06 MED ORDER — PREDNISONE 10 MG PO TABS: ORAL_TABLET | ORAL | 0 refills | Status: DC

## 2017-07-06 NOTE — Progress Notes (Signed)
Date:  07/06/2017   Name:  Jenna Franklin   DOB:  24-Apr-1940   MRN:  [id number]   Chief Complaint: Medication Problem (Dr.Wohl sent her here to discuss meds he has given her after endocopy. ) and Shortness of Breath (since Wohl had her switch Atenelol to 25 mg once at night and started new med to aid in her esophogus blood flow) HPI Esophageal varices - pt has been on atenolol bid for tachycardia.  At last GI visit, she was also started on nadolol.  She is concerned about BP being too low. Sine then, she was told to stop one of the atenolol. Cirrhosis - stable on lasix and spironolactone.  Weight is stable.  She feels well other than some mild anxiety. Tachycardia - she has not had any tachycardia, chest pain or wheezing.  Review of Systems  Constitutional: Negative for chills, fever and unexpected weight change.  Respiratory: Negative for cough, chest tightness, shortness of breath and wheezing.   Cardiovascular: Negative for chest pain, palpitations and leg swelling.  Gastrointestinal: Negative for abdominal pain.  Musculoskeletal: Negative for arthralgias.  Neurological: Negative for dizziness and headaches.  Psychiatric/Behavioral: Negative for sleep disturbance. The patient is nervous/anxious.     Patient Active Problem List   Diagnosis Date Noted  . Gastritis without bleeding   . Tendinopathy of right gluteus medius 01/01/2017  . Neck muscle spasm 11/18/2016  . Esophageal varices in cirrhosis (HCC)   . Cirrhosis of liver with ascites ([place]) 05/29/2015  . Non-alcoholic fatty liver disease 05/29/2015  . Post-menopausal atrophic vaginitis 05/29/2015  . Acid reflux 03/25/2015  . Calculus of kidney 03/25/2015  . Impaired glucose tolerance 09/20/2014  . Adjustment disorder with anxious mood 10/05/2013  . Nerve root pain 12/11/2011  . Bursitis, trochanteric 12/11/2011    Prior to Admission medications   Medication Sig Start Date End Date Taking? Authorizing Provider    atenolol (TENORMIN) 25 MG tablet TAKE (1) TABLET BY MOUTH TWICE DAILY 04/19/17   Glean Hess, MD  Calcium Carbonate-Vitamin D (CALCIUM + D PO) Take by mouth daily. Reported on 06/07/2015    [provider]  cyclobenzaprine (FLEXERIL) 10 MG tablet Take 1 tablet (10 mg total) by mouth 3 (three) times daily as needed for muscle spasms. 05/19/17   Glean Hess, MD  estradiol (ESTRACE) 0.5 MG tablet Take 1 tablet (0.5 mg total) by mouth daily. 05/19/17   Glean Hess, MD  fluticasone (FLONASE) 50 MCG/ACT nasal spray Place 2 sprays into both nostrils daily. 10/08/15   Glean Hess, MD  furosemide (LASIX) 40 MG tablet TAKE (1) TABLET BY MOUTH EVERY DAY 04/19/17   Glean Hess, MD  ibuprofen (ADVIL,MOTRIN) 200 MG tablet Take 200 mg by mouth 2 (two) times daily.    [provider]  nadolol (CORGARD) 20 MG tablet Take 1 tablet (20 mg total) by mouth daily. 06/21/17   Lucilla Lame, MD  spironolactone (ALDACTONE) 25 MG tablet Take 1 tablet (25 mg total) by mouth daily. 06/10/17   Lucilla Lame, MD  vitamin E 400 UNIT capsule Take 400 Units by mouth daily.    [provider]    Allergies  Allergen Reactions  . Baclofen Anxiety  . Codeine Nausea And Vomiting  . Prednisone Anxiety    Past Surgical History:  Procedure Laterality Date  . ABDOMINAL HYSTERECTOMY    . APPENDECTOMY    . BREAST BIOPSY Right    neg needle bx  . COMBINED HYSTERECTOMY  ABDOMINAL W/ A&P REPAIR / OOPHORECTOMY    . ESOPHAGOGASTRODUODENOSCOPY (EGD) WITH PROPOFOL N/A 06/17/2015   Procedure: ESOPHAGOGASTRODUODENOSCOPY (EGD) WITH PROPOFOL;  Surgeon: Lucilla Lame, MD;  Location: [place];  Service: Endoscopy;  Laterality: N/A;  . ESOPHAGOGASTRODUODENOSCOPY (EGD) WITH PROPOFOL N/A 06/17/2017   Procedure: ESOPHAGOGASTRODUODENOSCOPY (EGD) WITH PROPOFOL;  Surgeon: Lucilla Lame, MD;  Location: [place];  Service: Endoscopy;  Laterality: N/A;  . LAPAROSCOPIC CHOLECYSTECTOMY    .  TONSILLECTOMY      Social History   Tobacco Use  . Smoking status: Never Smoker  . Smokeless tobacco: Never Used  Substance Use Topics  . Alcohol use: No    Alcohol/week: 0.0 oz  . Drug use: No     Medication list has been reviewed and updated.  PHQ 2/9 Scores 11/16/2016 10/16/2016 08/16/2015 06/13/2015  PHQ - 2 Score 0 0 0 0    Physical Exam  Constitutional: She is oriented to person, place, and time. She appears well-developed. No distress.  HENT:  Head: Normocephalic and atraumatic.  Neck: Normal range of motion. Neck supple.  Cardiovascular: Normal heart sounds.  Pulmonary/Chest: Effort normal and breath sounds normal. No respiratory distress.  Abdominal: Soft. Normal appearance and bowel sounds are normal. She exhibits no fluid wave. There is no tenderness.  Musculoskeletal: Normal range of motion.  Neurological: She is alert and oriented to person, place, and time.  Skin: Skin is warm and dry. No rash noted.  Psychiatric: She has a normal mood and affect. Her behavior is normal. Thought content normal.  Nursing note and vitals reviewed.   BP 108/74   Pulse (!) 57   Resp 16   Ht 5\' 3"  (1.6 m)   Wt 171 lb (77.6 kg)   SpO2 97%   BMI 30.29 kg/m   Assessment and Plan: 1. Cirrhosis of liver with ascites, unspecified hepatic cirrhosis type ([place]) Continue diuretics  2. Esophageal varices in cirrhosis (HCC) Continue Nadolol  3. Hx of sinus tachycardia Stop Atenolol   Meds ordered this encounter  Medications  . predniSONE (DELTASONE) 10 MG tablet    Sig: 2-2-2-1-1-1 then stop    Dispense:  9 tablet    Refill:  0    Partially dictated using Editor, commissioning. Any errors are unintentional.  Halina Maidens, MD [facility]  07/06/2017

## 2017-07-06 NOTE — Patient Instructions (Signed)
Stop Atenolol - continue Nadolol  I will send an message to Dr. Allen Norris.

## 2017-07-07 ENCOUNTER — Telehealth: Payer: Self-pay | Admitting: Gastroenterology

## 2017-07-07 ENCOUNTER — Telehealth: Payer: Self-pay | Admitting: Internal Medicine

## 2017-07-07 ENCOUNTER — Ambulatory Visit: Payer: Medicare HMO

## 2017-07-07 NOTE — Telephone Encounter (Signed)
Please call the patient about her Nadolol Rx. She was changed to nadolol for her esophageal varices.  The atenolol is not indicated for this problems so we stopped it completely at her recent visit. Nadolol costs her $1/pill. Dr. Allen Norris says that an alternative is Carvedilol which is $4/30 days at [facility].  It is taken twice a day - same as the atenolol was taken. If she wants, I can send in this instead, once she runs out of her current supply of Nadolol.

## 2017-07-07 NOTE — Telephone Encounter (Signed)
Working with Dr. Allen Norris and Ginger on next step. Daughter has told her that she was told that the Nadolol is the BEST med and she fears that the patient is going to change based on the price and not take what is best. Ginger has called her so they will take over this issue and RX.

## 2017-07-07 NOTE — Telephone Encounter (Signed)
I just answered the same question to her PCP. Carvedilol and propranolol are also acceptable. Don't know which her insurance company covers.

## 2017-07-07 NOTE — Telephone Encounter (Signed)
Pt would like to know if there is another medication like the Nadolol that she could take that would be cheaper and with less side effects. If not, she will continue the nadolol. You recently started her on this due to discovery of varices.

## 2017-07-07 NOTE — Telephone Encounter (Signed)
Pt notified. Pt will wait to hear from her PCP.

## 2017-07-07 NOTE — Telephone Encounter (Signed)
Patient would like to talk about the Nidolol 20 mg. Please call her ASAP before she gets it filled.

## 2017-07-13 ENCOUNTER — Ambulatory Visit
Admission: RE | Admit: 2017-07-13 | Discharge: 2017-07-13 | Disposition: A | Discharge status: Home / Self Care | Payer: Medicare HMO | Source: Ambulatory Visit | Attending: Internal Medicine | Admitting: Internal Medicine

## 2017-07-13 DIAGNOSIS — Z1231 Encounter for screening mammogram for malignant neoplasm of breast: Secondary | ICD-10-CM | POA: Diagnosis not present

## 2017-09-27 ENCOUNTER — Ambulatory Visit: Payer: Medicare HMO | Admitting: Internal Medicine

## 2017-09-27 NOTE — Progress Notes (Deleted)
    Date:  09/27/2017   Name:  Jenna Franklin   DOB:  09-26-1940   MRN:  [id number]   Chief Complaint: No chief complaint on file. HPI Cirrhosis of liver with varices - followed by Dr. Allen Norris.  Getting [place] and AFP every 6 months.  No bleeding, on nadolol daily.  Impaired glucose tolerance - last A1C was normal.  Weight is stable.  Diet is good.  Review of Systems  Patient Active Problem List   Diagnosis Date Noted  . Hx of sinus tachycardia 07/06/2017  . Gastritis without bleeding   . Tendinopathy of right gluteus medius 01/01/2017  . Neck muscle spasm 11/18/2016  . Esophageal varices in cirrhosis (HCC)   . Cirrhosis of liver with ascites ([place]) 05/29/2015  . Post-menopausal atrophic vaginitis 05/29/2015  . Acid reflux 03/25/2015  . Calculus of kidney 03/25/2015  . Impaired glucose tolerance 09/20/2014  . Adjustment disorder with anxious mood 10/05/2013  . Nerve root pain 12/11/2011  . Bursitis, trochanteric 12/11/2011    Allergies  Allergen Reactions  . Baclofen Anxiety  . Codeine Nausea And Vomiting  . Prednisone Anxiety    Past Surgical History:  Procedure Laterality Date  . ABDOMINAL HYSTERECTOMY    . APPENDECTOMY    . BREAST BIOPSY Right    neg needle bx  . COMBINED HYSTERECTOMY ABDOMINAL W/ A&P REPAIR / OOPHORECTOMY    . ESOPHAGOGASTRODUODENOSCOPY (EGD) WITH PROPOFOL N/A 06/17/2015   Procedure: ESOPHAGOGASTRODUODENOSCOPY (EGD) WITH PROPOFOL;  Surgeon: Lucilla Lame, MD;  Location: [place];  Service: Endoscopy;  Laterality: N/A;  . ESOPHAGOGASTRODUODENOSCOPY (EGD) WITH PROPOFOL N/A 06/17/2017   Procedure: ESOPHAGOGASTRODUODENOSCOPY (EGD) WITH PROPOFOL;  Surgeon: Lucilla Lame, MD;  Location: [place];  Service: Endoscopy;  Laterality: N/A;  . LAPAROSCOPIC CHOLECYSTECTOMY    . TONSILLECTOMY      Social History   Tobacco Use  . Smoking status: Never Smoker  . Smokeless tobacco: Never Used  Substance Use Topics  . Alcohol use: No   Alcohol/week: 0.0 standard drinks  . Drug use: No     Medication list has been reviewed and updated.  No outpatient medications have been marked as taking for the 09/27/17 encounter (Appointment) with Glean Hess, MD.    [facility] 2/9 Scores 11/16/2016 10/16/2016 08/16/2015 06/13/2015  PHQ - 2 Score 0 0 0 0    Physical Exam  There were no vitals taken for this visit.  Assessment and Plan:  1. Cirrhosis of liver with ascites, unspecified hepatic cirrhosis type (HCC) ***  2. Esophageal varices in cirrhosis (HCC) ***  3. Impaired glucose tolerance ***

## 2017-09-29 ENCOUNTER — Encounter: Payer: Self-pay | Admitting: Internal Medicine

## 2017-09-29 ENCOUNTER — Ambulatory Visit (INDEPENDENT_AMBULATORY_CARE_PROVIDER_SITE_OTHER): Payer: Medicare HMO | Admitting: Internal Medicine

## 2017-09-29 ENCOUNTER — Other Ambulatory Visit
Admission: RE | Admit: 2017-09-29 | Discharge: 2017-09-29 | Disposition: A | Discharge status: Home / Self Care | Payer: Medicare HMO | Source: Ambulatory Visit | Attending: Internal Medicine | Admitting: Internal Medicine

## 2017-09-29 VITALS — BP 112/74 | HR 74 | Ht 63.0 in | Wt 172.0 lb

## 2017-09-29 DIAGNOSIS — K7460 Unspecified cirrhosis of liver: Secondary | ICD-10-CM

## 2017-09-29 DIAGNOSIS — R7303 Prediabetes: Secondary | ICD-10-CM

## 2017-09-29 DIAGNOSIS — I8510 Secondary esophageal varices without bleeding: Secondary | ICD-10-CM

## 2017-09-29 DIAGNOSIS — R188 Other ascites: Secondary | ICD-10-CM | POA: Insufficient documentation

## 2017-09-29 LAB — COMPREHENSIVE METABOLIC PANEL
ALBUMIN: 4.1 g/dL (ref 3.5–5.0)
ALT: 51 U/L — ABNORMAL HIGH (ref 0–44)
ANION GAP: 11 (ref 5–15)
AST: 53 U/L — ABNORMAL HIGH (ref 15–41)
Alkaline Phosphatase: 90 U/L (ref 38–126)
BUN: 19 mg/dL (ref 8–23)
CO2: 22 mmol/L (ref 22–32)
Calcium: 9.2 mg/dL (ref 8.9–10.3)
Chloride: 106 mmol/L (ref 98–111)
Creatinine, Ser: 0.96 mg/dL (ref 0.44–1.00)
GFR calc Af Amer: >60 mL/min (ref >60)
GFR calc non Af Amer: 56 mL/min — ABNORMAL LOW (ref >60)
GLUCOSE: 94 mg/dL (ref 70–99)
Potassium: 4.1 mmol/L (ref 3.5–5.1)
SODIUM: 139 mmol/L (ref 135–145)
TOTAL PROTEIN: 8.2 g/dL — AB (ref 6.5–8.1)
Total Bilirubin: 1 mg/dL (ref 0.3–1.2)

## 2017-09-29 LAB — HEMOGLOBIN A1C
Hgb A1c MFr Bld: 5.7 % — ABNORMAL HIGH (ref 4.8–5.6)
Mean Plasma Glucose: 116.89 mg/dL

## 2017-09-29 NOTE — Progress Notes (Signed)
Date:  09/29/2017   Name:  Jenna Franklin   DOB:  09/03/1940   MRN:  [id number]   Chief Complaint: Cirrhosis (F/up)  Cirrhosis of liver - on nadolol for varices, lasix and spironolactone.  Followed by GI.  EGD on 07/04/17 showed no bleeding.  She will need AFP and EGD in 6 months.  Pre-diabetes - doing well with diet.  No blurred vision, swelling or excess urination.  Lab Results  Component Value Date   HGBA1C 5.4 05/19/2017   Lab Results  Component Value Date   CREATININE 0.90 05/19/2017   BUN 18 05/19/2017   NA 137 05/19/2017   K 3.9 05/19/2017   CL 103 05/19/2017   CO2 25 05/19/2017   Lab Results  Component Value Date   CHOL 119 11/18/2016   HDL 48 11/18/2016   LDLCALC 56 11/18/2016   TRIG 76 11/18/2016   CHOLHDL 2.5 11/18/2016   Lab Results  Component Value Date   WBC 5.8 05/19/2017   HGB 14.8 05/19/2017   HCT 43.0 05/19/2017   MCV 100.0 05/19/2017   PLT 106 (L) 05/19/2017    Review of Systems  Constitutional: Negative for appetite change, fatigue, fever and unexpected weight change.  HENT: Negative for tinnitus and trouble swallowing.   Eyes: Negative for visual disturbance.  Respiratory: Negative for cough, chest tightness and shortness of breath.   Cardiovascular: Negative for chest pain, palpitations and leg swelling.  Gastrointestinal: Negative for abdominal pain.  Endocrine: Negative for polydipsia and polyuria.  Genitourinary: Negative for dysuria and hematuria.  Musculoskeletal: Negative for arthralgias.  Neurological: Negative for tremors, numbness and headaches.  Psychiatric/Behavioral: Negative for dysphoric mood.    Patient Active Problem List   Diagnosis Date Noted  . Hx of sinus tachycardia 07/06/2017  . Gastritis without bleeding   . Tendinopathy of right gluteus medius 01/01/2017  . Neck muscle spasm 11/18/2016  . Esophageal varices in cirrhosis (HCC)   . Cirrhosis of liver with ascites ([place]) 05/29/2015  . Post-menopausal  atrophic vaginitis 05/29/2015  . Acid reflux 03/25/2015  . Calculus of kidney 03/25/2015  . Pre-diabetes 09/20/2014  . Adjustment disorder with anxious mood 10/05/2013  . Nerve root pain 12/11/2011  . Bursitis, trochanteric 12/11/2011    Allergies  Allergen Reactions  . Baclofen Anxiety  . Codeine Nausea And Vomiting  . Prednisone Anxiety    Past Surgical History:  Procedure Laterality Date  . ABDOMINAL HYSTERECTOMY    . APPENDECTOMY    . BREAST BIOPSY Right    neg needle bx  . COMBINED HYSTERECTOMY ABDOMINAL W/ A&P REPAIR / OOPHORECTOMY    . ESOPHAGOGASTRODUODENOSCOPY (EGD) WITH PROPOFOL N/A 06/17/2015   Procedure: ESOPHAGOGASTRODUODENOSCOPY (EGD) WITH PROPOFOL;  Surgeon: Lucilla Lame, MD;  Location: [place];  Service: Endoscopy;  Laterality: N/A;  . ESOPHAGOGASTRODUODENOSCOPY (EGD) WITH PROPOFOL N/A 06/17/2017   Procedure: ESOPHAGOGASTRODUODENOSCOPY (EGD) WITH PROPOFOL;  Surgeon: Lucilla Lame, MD;  Location: [place];  Service: Endoscopy;  Laterality: N/A;  . LAPAROSCOPIC CHOLECYSTECTOMY    . TONSILLECTOMY      Social History   Tobacco Use  . Smoking status: Never Smoker  . Smokeless tobacco: Never Used  Substance Use Topics  . Alcohol use: No    Alcohol/week: 0.0 standard drinks  . Drug use: No     Medication list has been reviewed and updated.  Current Meds  Medication Sig  . Calcium Carbonate-Vitamin D (CALCIUM + D PO) Take by mouth daily. Reported on 06/07/2015  . cyclobenzaprine (  FLEXERIL) 10 MG tablet Take 1 tablet (10 mg total) by mouth 3 (three) times daily as needed for muscle spasms. (Patient taking differently: Take 10 mg by mouth as needed for muscle spasms. Take1/2 -1 tablet as needed for muscle spasms daily.)  . estradiol (ESTRACE) 0.5 MG tablet Take 1 tablet (0.5 mg total) by mouth daily.  . fluticasone (FLONASE) 50 MCG/ACT nasal spray Place 2 sprays into both nostrils daily.  . furosemide (LASIX) 40 MG tablet TAKE (1) TABLET BY  MOUTH EVERY DAY  . ibuprofen (ADVIL,MOTRIN) 200 MG tablet Take 200 mg by mouth 2 (two) times daily.  . nadolol (CORGARD) 20 MG tablet Take 1 tablet (20 mg total) by mouth daily.  Marland Kitchen spironolactone (ALDACTONE) 25 MG tablet Take 1 tablet (25 mg total) by mouth daily.  . vitamin E 400 UNIT capsule Take 400 Units by mouth daily.    PHQ 2/9 Scores 11/16/2016 10/16/2016 08/16/2015 06/13/2015  PHQ - 2 Score 0 0 0 0    Physical Exam  Constitutional: She is oriented to person, place, and time. She appears well-developed. No distress.  HENT:  Head: Normocephalic and atraumatic.  Neck: Normal range of motion. Neck supple.  Cardiovascular: Normal rate, regular rhythm and normal heart sounds.  Pulmonary/Chest: Effort normal and breath sounds normal. No respiratory distress.  Musculoskeletal: Normal range of motion.  Neurological: She is alert and oriented to person, place, and time.  Skin: Skin is warm and dry. No rash noted.  Psychiatric: She has a normal mood and affect. Her behavior is normal. Thought content normal.  Nursing note and vitals reviewed.   BP 112/74   Pulse 74   Ht 5\' 3"  (1.6 m)   Wt 172 lb (78 kg)   SpO2 96%   BMI 30.47 kg/m   Assessment and Plan: 1. Cirrhosis of liver with ascites, unspecified hepatic cirrhosis type ([place]) Doing well, continue current diuretic regimen - Comprehensive metabolic panel  2. Esophageal varices in cirrhosis (HCC) Now on nadolol with plan to repeat EGD in 6 months - Comprehensive metabolic panel  3. Pre-diabetes Continue healthy diet with reduced carbs - Comprehensive metabolic panel - Hemoglobin A1c   No orders of the defined types were placed in this encounter.   Partially dictated using Editor, commissioning. Any errors are unintentional.  Halina Maidens, MD [facility]  09/29/2017

## 2017-10-11 DIAGNOSIS — Z1283 Encounter for screening for malignant neoplasm of skin: Secondary | ICD-10-CM | POA: Diagnosis not present

## 2017-10-11 DIAGNOSIS — L821 Other seborrheic keratosis: Secondary | ICD-10-CM | POA: Diagnosis not present

## 2017-10-11 DIAGNOSIS — L578 Other skin changes due to chronic exposure to nonionizing radiation: Secondary | ICD-10-CM | POA: Diagnosis not present

## 2017-10-22 ENCOUNTER — Encounter: Payer: Self-pay | Admitting: Internal Medicine

## 2017-10-22 ENCOUNTER — Telehealth: Payer: Self-pay

## 2017-10-22 ENCOUNTER — Ambulatory Visit (INDEPENDENT_AMBULATORY_CARE_PROVIDER_SITE_OTHER): Payer: Medicare HMO | Admitting: Internal Medicine

## 2017-10-22 VITALS — BP 128/78 | HR 83 | Temp 97.9°F | Ht 63.0 in | Wt 172.0 lb

## 2017-10-22 DIAGNOSIS — N760 Acute vaginitis: Secondary | ICD-10-CM

## 2017-10-22 LAB — POCT WET PREP WITH KOH
KOH Prep POC: POSITIVE — AB
RBC Wet Prep HPF POC: 0
Trichomonas, UA: NEGATIVE

## 2017-10-22 LAB — POCT URINALYSIS DIPSTICK
Bilirubin, UA: NEGATIVE
Blood, UA: NEGATIVE
Glucose, UA: NEGATIVE
Ketones, UA: NEGATIVE
Leukocytes, UA: NEGATIVE
Nitrite, UA: NEGATIVE
Protein, UA: NEGATIVE
Spec Grav, UA: 1.015 (ref 1.010–1.025)
Urobilinogen, UA: 0.2 U/dL
pH, UA: 6.5 (ref 5.0–8.0)

## 2017-10-22 MED ORDER — METRONIDAZOLE 500 MG PO TABS: 500.0000 mg | ORAL_TABLET | Freq: Two times a day (BID) | ORAL | 0 refills | Status: AC

## 2017-10-22 NOTE — Telephone Encounter (Signed)
ERROR

## 2017-10-22 NOTE — Patient Instructions (Signed)
Take fluconazole once a day for 3 days  Take Metronidazole twice a day for one week  Use diaper rash ointment to protect the delicate tissues

## 2017-10-22 NOTE — Progress Notes (Signed)
Date:  10/22/2017   Name:  Jenna Franklin   DOB:  [birth year]   MRN:  [id number]   Chief Complaint: Urinary Tract Infection (Burning, urgency. Started yesterday. ) Vaginal Discharge  The patient's primary symptoms include pelvic pain. The patient's pertinent negatives include no vaginal discharge. The pain is mild. The problem affects both sides. Associated symptoms include dysuria. Pertinent negatives include no fever.      Review of Systems  Constitutional: Negative for fatigue and fever.  Respiratory: Negative for cough, chest tightness and shortness of breath.   Cardiovascular: Negative for chest pain and palpitations.  Genitourinary: Positive for dysuria and pelvic pain. Negative for genital sores, vaginal bleeding and vaginal discharge.    Patient Active Problem List   Diagnosis Date Noted  . Hx of sinus tachycardia 07/06/2017  . Gastritis without bleeding   . Tendinopathy of right gluteus medius 01/01/2017  . Neck muscle spasm 11/18/2016  . Esophageal varices in cirrhosis (HCC)   . Cirrhosis of liver with ascites ([place]) 05/29/2015  . Post-menopausal atrophic vaginitis 05/29/2015  . Acid reflux 03/25/2015  . Calculus of kidney 03/25/2015  . Pre-diabetes 09/20/2014  . Adjustment disorder with anxious mood 10/05/2013  . Nerve root pain 12/11/2011  . Bursitis, trochanteric 12/11/2011    Allergies  Allergen Reactions  . Baclofen Anxiety  . Codeine Nausea And Vomiting  . Prednisone Anxiety    Past Surgical History:  Procedure Laterality Date  . ABDOMINAL HYSTERECTOMY    . APPENDECTOMY    . BREAST BIOPSY Right    neg needle bx  . COMBINED HYSTERECTOMY ABDOMINAL W/ A&P REPAIR / OOPHORECTOMY    . ESOPHAGOGASTRODUODENOSCOPY (EGD) WITH PROPOFOL N/A 06/17/2015   Procedure: ESOPHAGOGASTRODUODENOSCOPY (EGD) WITH PROPOFOL;  Surgeon: Lucilla Lame, MD;  Location: [place];  Service: Endoscopy;  Laterality: N/A;  . ESOPHAGOGASTRODUODENOSCOPY (EGD) WITH PROPOFOL  N/A 06/17/2017   Procedure: ESOPHAGOGASTRODUODENOSCOPY (EGD) WITH PROPOFOL;  Surgeon: Lucilla Lame, MD;  Location: [place];  Service: Endoscopy;  Laterality: N/A;  . LAPAROSCOPIC CHOLECYSTECTOMY    . TONSILLECTOMY      Social History   Tobacco Use  . Smoking status: Never Smoker  . Smokeless tobacco: Never Used  Substance Use Topics  . Alcohol use: No    Alcohol/week: 0.0 standard drinks  . Drug use: No     Medication list has been reviewed and updated.  Current Meds  Medication Sig  . Calcium Carbonate-Vitamin D (CALCIUM + D PO) Take by mouth daily. Reported on 06/07/2015  . cyclobenzaprine (FLEXERIL) 10 MG tablet Take 1 tablet (10 mg total) by mouth 3 (three) times daily as needed for muscle spasms. (Patient taking differently: Take 10 mg by mouth as needed for muscle spasms. Take1/2 -1 tablet as needed for muscle spasms daily.)  . estradiol (ESTRACE) 0.5 MG tablet Take 1 tablet (0.5 mg total) by mouth daily.  . fluticasone (FLONASE) 50 MCG/ACT nasal spray Place 2 sprays into both nostrils daily.  . furosemide (LASIX) 40 MG tablet TAKE (1) TABLET BY MOUTH EVERY DAY  . ibuprofen (ADVIL,MOTRIN) 200 MG tablet Take 200 mg by mouth 2 (two) times daily.  . nadolol (CORGARD) 20 MG tablet Take 1 tablet (20 mg total) by mouth daily.  Marland Kitchen spironolactone (ALDACTONE) 25 MG tablet Take 1 tablet (25 mg total) by mouth daily.  . vitamin E 400 UNIT capsule Take 400 Units by mouth daily.    PHQ 2/9 Scores 11/16/2016 10/16/2016 08/16/2015 06/13/2015  PHQ - 2 Score 0  0 0 0    Physical Exam  Constitutional: She is oriented to person, place, and time. She appears well-developed. No distress.  HENT:  Head: Normocephalic and atraumatic.  Pulmonary/Chest: Effort normal. No respiratory distress.  Genitourinary: There is no rash or lesion on the right labia. There is tenderness on the left labia. There is no rash or lesion on the left labia. Vaginal discharge (thin watery) found.    Musculoskeletal: Normal range of motion.  Neurological: She is alert and oriented to person, place, and time.  Skin: Skin is warm and dry. No rash noted.  Psychiatric: She has a normal mood and affect. Her behavior is normal. Thought content normal.  Nursing note and vitals reviewed.  Microscopic wet-mount exam shows clue cells, excessive bacteria, monilia. Urine dipstick shows negative for all components.  Micro exam: not done.  BP 128/78 (BP Location: Right Arm, Patient Position: Sitting, Cuff Size: Normal)   Pulse 83   Temp 97.9 F (36.6 C) (Oral)   Ht 5\' 3"  (1.6 m)   Wt 172 lb (78 kg)   SpO2 98%   BMI 30.47 kg/m   Assessment and Plan: 1. Acute vaginitis Take diflucan daily x 3 day Use diaper rash ointment topically for protection - POCT Urinalysis Dipstick - metroNIDAZOLE (FLAGYL) 500 MG tablet; Take 1 tablet (500 mg total) by mouth 2 (two) times daily for 7 days.  Dispense: 14 tablet; Refill: 0 - POCT Wet Prep with KOH   Meds ordered this encounter  Medications  . metroNIDAZOLE (FLAGYL) 500 MG tablet    Sig: Take 1 tablet (500 mg total) by mouth 2 (two) times daily for 7 days.    Dispense:  14 tablet    Refill:  0    Partially dictated using Editor, commissioning. Any errors are unintentional.  Halina Maidens, MD [facility]  10/22/2017   There are no diagnoses linked to this encounter.

## 2017-11-05 DIAGNOSIS — H2513 Age-related nuclear cataract, bilateral: Secondary | ICD-10-CM | POA: Diagnosis not present

## 2017-11-15 ENCOUNTER — Ambulatory Visit (INDEPENDENT_AMBULATORY_CARE_PROVIDER_SITE_OTHER): Payer: Medicare HMO | Admitting: Internal Medicine

## 2017-11-15 ENCOUNTER — Encounter: Payer: Self-pay | Admitting: Internal Medicine

## 2017-11-15 VITALS — BP 124/78 | HR 68 | Temp 97.8°F | Ht 63.0 in | Wt 172.0 lb

## 2017-11-15 DIAGNOSIS — J0100 Acute maxillary sinusitis, unspecified: Secondary | ICD-10-CM

## 2017-11-15 DIAGNOSIS — H6981 Other specified disorders of Eustachian tube, right ear: Secondary | ICD-10-CM | POA: Diagnosis not present

## 2017-11-15 MED ORDER — AMOXICILLIN-POT CLAVULANATE 875-125 MG PO TABS: 1.0000 | ORAL_TABLET | Freq: Two times a day (BID) | ORAL | 0 refills | Status: AC

## 2017-11-15 MED ORDER — PREDNISONE 10 MG PO TABS: ORAL_TABLET | ORAL | 0 refills | Status: DC

## 2017-11-15 NOTE — Patient Instructions (Signed)
Continue Claritin and Mucinex  Avoid "D" products which can raise BP

## 2017-11-15 NOTE — Progress Notes (Signed)
Date:  11/15/2017   Name:  Jenna Franklin   DOB:  [birth year]   MRN:  [id number]   Chief Complaint: Ear Pain (Started Thursday. Could not get in to see Dr Charolett Bumpers. [place]ne [place]ores OTC Earache drops along with Anbesel gel for gums. Seen the dentist last week because gums are aching as well and they said they think it may be sinus related. RIGHT EAR AND GUMS radiating down into neck.)  Otalgia   There is pain in the right ear. This is a new problem. The current episode started in the past 7 days. The problem occurs constantly. The problem has been unchanged. There has been no fever. Pertinent negatives include no coughing, headaches, hearing loss, rhinorrhea, sore throat or vomiting. She has tried acetaminophen and heat packs for the symptoms. The treatment provided mild relief.  She was seen by her Dentist and there is no tooth disease.  Review of Systems  Constitutional: Negative for chills, fatigue and unexpected weight change.  HENT: Positive for ear pain and sinus pressure. Negative for hearing loss, rhinorrhea, sore throat and trouble swallowing.   Respiratory: Negative for cough and chest tightness.   Cardiovascular: Negative for chest pain.  Gastrointestinal: Negative for vomiting.  Neurological: Positive for dizziness. Negative for light-headedness and headaches.    Patient Active Problem List   Diagnosis Date Noted  . Hx of sinus tachycardia 07/06/2017  . Gastritis without bleeding   . Tendinopathy of right gluteus medius 01/01/2017  . Neck muscle spasm 11/18/2016  . Esophageal varices in cirrhosis (HCC)   . Cirrhosis of liver with ascites ([place]) 05/29/2015  . Post-menopausal atrophic vaginitis 05/29/2015  . Acid reflux 03/25/2015  . Calculus of kidney 03/25/2015  . Pre-diabetes 09/20/2014  . Adjustment disorder with anxious mood 10/05/2013  . Bursitis, trochanteric 12/11/2011    Allergies  Allergen Reactions  . Baclofen Anxiety  . Codeine Nausea And Vomiting  . Prednisone  Anxiety    Past Surgical History:  Procedure Laterality Date  . ABDOMINAL HYSTERECTOMY    . APPENDECTOMY    . BREAST BIOPSY Right    neg needle bx  . COMBINED HYSTERECTOMY ABDOMINAL W/ A&P REPAIR / OOPHORECTOMY    . ESOPHAGOGASTRODUODENOSCOPY (EGD) WITH PROPOFOL N/A 06/17/2015   Procedure: ESOPHAGOGASTRODUODENOSCOPY (EGD) WITH PROPOFOL;  Surgeon: Lucilla Lame, MD;  Location: [place];  Service: Endoscopy;  Laterality: N/A;  . ESOPHAGOGASTRODUODENOSCOPY (EGD) WITH PROPOFOL N/A 06/17/2017   Procedure: ESOPHAGOGASTRODUODENOSCOPY (EGD) WITH PROPOFOL;  Surgeon: Lucilla Lame, MD;  Location: [place];  Service: Endoscopy;  Laterality: N/A;  . LAPAROSCOPIC CHOLECYSTECTOMY    . TONSILLECTOMY      Social History   Tobacco Use  . Smoking status: Never Smoker  . Smokeless tobacco: Never Used  . Tobacco comment: smoking cessation materials not required  Substance Use Topics  . Alcohol use: No    Alcohol/week: 0.0 standard drinks  . Drug use: No     Medication list has been reviewed and updated.  Current Meds  Medication Sig  . Calcium Carbonate-Vitamin D (CALCIUM + D PO) Take by mouth daily. Reported on 06/07/2015  . cyclobenzaprine (FLEXERIL) 10 MG tablet Take 1 tablet (10 mg total) by mouth 3 (three) times daily as needed for muscle spasms. (Patient taking differently: Take 10 mg by mouth as needed for muscle spasms. Take1/2 -1 tablet as needed for muscle spasms daily.)  . estradiol (ESTRACE) 0.5 MG tablet Take 1 tablet (0.5 mg total) by mouth daily.  . fluticasone (FLONASE)  50 MCG/ACT nasal spray Place 2 sprays into both nostrils daily.  . furosemide (LASIX) 40 MG tablet TAKE (1) TABLET BY MOUTH EVERY DAY  . ibuprofen (ADVIL,MOTRIN) 200 MG tablet Take 200 mg by mouth 2 (two) times daily.  . nadolol (CORGARD) 20 MG tablet Take 1 tablet (20 mg total) by mouth daily.  Marland Kitchen spironolactone (ALDACTONE) 25 MG tablet Take 1 tablet (25 mg total) by mouth daily.  . vitamin E 400  UNIT capsule Take 400 Units by mouth daily.    PHQ 2/9 Scores 11/16/2016 10/16/2016 08/16/2015 06/13/2015  PHQ - 2 Score 0 0 0 0    Physical Exam  Constitutional: She is oriented to person, place, and time. She appears well-developed and well-nourished. No distress.  HENT:  Head: Normocephalic and atraumatic.  Right Ear: External ear and ear canal normal. Tympanic membrane is retracted. Tympanic membrane is not erythematous.  Left Ear: External ear and ear canal normal. Tympanic membrane is not erythematous and not retracted.  Nose: Right sinus exhibits maxillary sinus tenderness and frontal sinus tenderness. Left sinus exhibits no maxillary sinus tenderness and no frontal sinus tenderness.  Mouth/Throat: Uvula is midline and mucous membranes are normal. No oral lesions. Posterior oropharyngeal erythema present. No oropharyngeal exudate.  Eyes: Pupils are equal, round, and reactive to light.  Neck: Normal range of motion.  Cardiovascular: Normal rate, regular rhythm and normal heart sounds.  Pulmonary/Chest: Effort normal and breath sounds normal. No respiratory distress. She has no wheezes. She has no rales.  Musculoskeletal: Normal range of motion.  Lymphadenopathy:    She has no cervical adenopathy.  Neurological: She is alert and oriented to person, place, and time.  Skin: Skin is warm and dry. No rash noted.  Psychiatric: She has a normal mood and affect. Her behavior is normal. Thought content normal.  Nursing note and vitals reviewed.   BP 124/78 (BP Location: Right Arm, Patient Position: Sitting, Cuff Size: Normal)   Pulse 68   Temp 97.8 F (36.6 C) (Oral)   Ht 5\' 3"  (1.6 m)   Wt 172 lb (78 kg)   SpO2 98%   BMI 30.47 kg/m   Assessment and Plan: 1. Acute non-recurrent maxillary sinusitis Continue claritin and mucinex - amoxicillin-clavulanate (AUGMENTIN) 875-125 MG tablet; Take 1 tablet by mouth 2 (two) times daily for 10 days.  Dispense: 20 tablet; Refill: 0  2.  Eustachian tube dysfunction, right - predniSONE (DELTASONE) 10 MG tablet; 2,2,2,1,1,1 then stop  Dispense: 9 tablet; Refill: 0   Partially dictated using Editor, commissioning. Any errors are unintentional.  Halina Maidens, MD [facility]  11/15/2017

## 2017-11-17 ENCOUNTER — Ambulatory Visit: Payer: Medicare HMO

## 2017-11-19 ENCOUNTER — Ambulatory Visit (INDEPENDENT_AMBULATORY_CARE_PROVIDER_SITE_OTHER): Payer: Medicare HMO | Admitting: Internal Medicine

## 2017-11-19 ENCOUNTER — Other Ambulatory Visit
Admission: RE | Admit: 2017-11-19 | Discharge: 2017-11-19 | Disposition: A | Discharge status: Home / Self Care | Payer: Medicare HMO | Source: Ambulatory Visit | Attending: Internal Medicine | Admitting: Internal Medicine

## 2017-11-19 ENCOUNTER — Encounter: Payer: Self-pay | Admitting: Internal Medicine

## 2017-11-19 VITALS — BP 117/78 | HR 69 | Resp 16 | Ht 63.0 in | Wt 174.0 lb

## 2017-11-19 DIAGNOSIS — M7061 Trochanteric bursitis, right hip: Secondary | ICD-10-CM | POA: Diagnosis not present

## 2017-11-19 DIAGNOSIS — R7303 Prediabetes: Secondary | ICD-10-CM | POA: Insufficient documentation

## 2017-11-19 DIAGNOSIS — I8510 Secondary esophageal varices without bleeding: Secondary | ICD-10-CM | POA: Insufficient documentation

## 2017-11-19 DIAGNOSIS — Z0000 Encounter for general adult medical examination without abnormal findings: Secondary | ICD-10-CM | POA: Insufficient documentation

## 2017-11-19 DIAGNOSIS — K7460 Unspecified cirrhosis of liver: Secondary | ICD-10-CM

## 2017-11-19 DIAGNOSIS — Z8679 Personal history of other diseases of the circulatory system: Secondary | ICD-10-CM | POA: Diagnosis not present

## 2017-11-19 DIAGNOSIS — R000 Tachycardia, unspecified: Secondary | ICD-10-CM | POA: Diagnosis not present

## 2017-11-19 LAB — POCT URINALYSIS DIPSTICK
Bilirubin, UA: NEGATIVE
Blood, UA: NEGATIVE
Glucose, UA: NEGATIVE
Ketones, UA: NEGATIVE
NITRITE UA: NEGATIVE
PROTEIN UA: NEGATIVE
Spec Grav, UA: 1.01 (ref 1.010–1.025)
Urobilinogen, UA: 0.2 E.U./dL
pH, UA: 6.5 (ref 5.0–8.0)

## 2017-11-19 LAB — CBC WITH DIFFERENTIAL/PLATELET
BASOS ABS: 0 10*3/uL (ref 0–0.1)
Basophils Relative: 1 %
Eosinophils Absolute: 0.1 10*3/uL (ref 0–0.7)
Eosinophils Relative: 2 %
HEMATOCRIT: 43.3 % (ref 35.0–47.0)
Hemoglobin: 14.7 g/dL (ref 12.0–16.0)
LYMPHS PCT: 24 %
Lymphs Abs: 1.7 10*3/uL (ref 1.0–3.6)
MCH: 34.4 pg — ABNORMAL HIGH (ref 26.0–34.0)
MCHC: 34.1 g/dL (ref 32.0–36.0)
MCV: 100.9 fL — AB (ref 80.0–100.0)
Monocytes Absolute: 0.5 10*3/uL (ref 0.2–0.9)
Monocytes Relative: 7 %
NEUTROS ABS: 4.5 10*3/uL (ref 1.4–6.5)
NEUTROS PCT: 66 %
PLATELETS: 111 10*3/uL — AB (ref 150–440)
RBC: 4.29 MIL/uL (ref 3.80–5.20)
RDW: 13.9 % (ref 11.5–14.5)
WBC: 6.8 10*3/uL (ref 3.6–11.0)

## 2017-11-19 LAB — COMPREHENSIVE METABOLIC PANEL
ALT: 71 U/L — AB (ref 0–44)
AST: 60 U/L — AB (ref 15–41)
Albumin: 4.3 g/dL (ref 3.5–5.0)
Alkaline Phosphatase: 74 U/L (ref 38–126)
Anion gap: 9 (ref 5–15)
BUN: 23 mg/dL (ref 8–23)
CHLORIDE: 104 mmol/L (ref 98–111)
CO2: 24 mmol/L (ref 22–32)
CREATININE: 0.83 mg/dL (ref 0.44–1.00)
Calcium: 9.1 mg/dL (ref 8.9–10.3)
GFR calc Af Amer: >60 mL/min (ref >60)
Glucose, Bld: 157 mg/dL — ABNORMAL HIGH (ref 70–99)
Potassium: 3.7 mmol/L (ref 3.5–5.1)
Sodium: 137 mmol/L (ref 135–145)
Total Bilirubin: 1.4 mg/dL — ABNORMAL HIGH (ref 0.3–1.2)
Total Protein: 8.2 g/dL — ABNORMAL HIGH (ref 6.5–8.1)

## 2017-11-19 LAB — TSH: TSH: 4.009 u[IU]/mL (ref 0.350–4.500)

## 2017-11-19 LAB — LIPID PANEL
CHOLESTEROL: 129 mg/dL (ref 0–200)
HDL: 45 mg/dL (ref >40)
LDL Cholesterol: 61 mg/dL (ref 0–99)
TRIGLYCERIDES: 115 mg/dL (ref <150)
Total CHOL/HDL Ratio: 2.9 RATIO
VLDL: 23 mg/dL (ref 0–40)

## 2017-11-19 NOTE — Patient Instructions (Addendum)
Return in one month for Flu vaccine (here or at [facility])  One month after flu vaccine, come here to get the second pneumonia vaccine (PPV-23)

## 2017-11-19 NOTE — Progress Notes (Signed)
Date:  11/19/2017   Name:  Jenna Franklin   DOB:  07-Aug-1940   MRN:  [id number]   Chief Complaint: Annual Exam (Will schedule MAW after CPE today.Has had Ear Inf and is on ABXfeeling tiny bit better but should she do Flu shot?) She had mammogram in May was normal.  She is due for second pneumonia vaccine but needs to finish antibiotics.   Otalgia   There is pain in both ears. This is a new problem. The current episode started in the past 7 days. The problem has been gradually improving. There has been no fever. The pain is mild. Pertinent negatives include no abdominal pain, coughing, diarrhea, headaches, hearing loss, rash or vomiting. She has tried antibiotics for the symptoms. The treatment provided moderate relief.   Cirrhosis with varices - doing well, weight is stable, tolerating medications.    Pre-diabetes - last A1C was good, continues on low carb diet.  Hip pain - right hip bursitis, seen by Ortho.  Still limiting her walking and interfering sometimes with sleep.  Review of Systems  Constitutional: Negative for chills, fatigue and fever.  HENT: Positive for ear pain and sinus pressure. Negative for congestion, hearing loss, sinus pain, tinnitus, trouble swallowing and voice change.   Eyes: Negative for visual disturbance.  Respiratory: Negative for cough, chest tightness, shortness of breath and wheezing.   Cardiovascular: Negative for chest pain, palpitations and leg swelling.  Gastrointestinal: Negative for abdominal pain, constipation, diarrhea and vomiting.  Endocrine: Negative for polydipsia and polyuria.  Genitourinary: Negative for dysuria, frequency, genital sores, vaginal bleeding and vaginal discharge.  Musculoskeletal: Positive for arthralgias and gait problem. Negative for joint swelling.  Skin: Negative for color change and rash.  Neurological: Negative for dizziness, tremors, light-headedness and headaches.  Hematological: Negative for adenopathy. Does not  bruise/bleed easily.  Psychiatric/Behavioral: Negative for dysphoric mood and sleep disturbance. The patient is not nervous/anxious.     Patient Active Problem List   Diagnosis Date Noted  . Hx of sinus tachycardia 07/06/2017  . Gastritis without bleeding   . Tendinopathy of right gluteus medius 01/01/2017  . Neck muscle spasm 11/18/2016  . Esophageal varices in cirrhosis (HCC)   . Cirrhosis of liver with ascites ([place]) 05/29/2015  . Post-menopausal atrophic vaginitis 05/29/2015  . Acid reflux 03/25/2015  . Calculus of kidney 03/25/2015  . Tachycardia 03/25/2015  . Pre-diabetes 09/20/2014  . Adjustment disorder with anxious mood 10/05/2013  . Bursitis, trochanteric 12/11/2011    Allergies  Allergen Reactions  . Baclofen Anxiety  . Codeine Nausea And Vomiting  . Prednisone Anxiety    Past Surgical History:  Procedure Laterality Date  . ABDOMINAL HYSTERECTOMY    . APPENDECTOMY    . BREAST BIOPSY Right    neg needle bx  . COMBINED HYSTERECTOMY ABDOMINAL W/ A&P REPAIR / OOPHORECTOMY    . ESOPHAGOGASTRODUODENOSCOPY (EGD) WITH PROPOFOL N/A 06/17/2015   Procedure: ESOPHAGOGASTRODUODENOSCOPY (EGD) WITH PROPOFOL;  Surgeon: Lucilla Lame, MD;  Location: [place];  Service: Endoscopy;  Laterality: N/A;  . ESOPHAGOGASTRODUODENOSCOPY (EGD) WITH PROPOFOL N/A 06/17/2017   Procedure: ESOPHAGOGASTRODUODENOSCOPY (EGD) WITH PROPOFOL;  Surgeon: Lucilla Lame, MD;  Location: [place];  Service: Endoscopy;  Laterality: N/A;  . LAPAROSCOPIC CHOLECYSTECTOMY    . TONSILLECTOMY      Social History   Tobacco Use  . Smoking status: Never Smoker  . Smokeless tobacco: Never Used  . Tobacco comment: smoking cessation materials not required  Substance Use Topics  . Alcohol  use: No    Alcohol/week: 0.0 standard drinks  . Drug use: No     Medication list has been reviewed and updated.  Current Meds  Medication Sig  . amoxicillin-clavulanate (AUGMENTIN) 875-125 MG tablet Take 1  tablet by mouth 2 (two) times daily for 10 days.  . cyclobenzaprine (FLEXERIL) 10 MG tablet Take 1 tablet (10 mg total) by mouth 3 (three) times daily as needed for muscle spasms. (Patient taking differently: Take 10 mg by mouth as needed for muscle spasms. Take1/2 -1 tablet as needed for muscle spasms daily.)  . estradiol (ESTRACE) 0.5 MG tablet Take 1 tablet (0.5 mg total) by mouth daily.  . fluticasone (FLONASE) 50 MCG/ACT nasal spray Place 2 sprays into both nostrils daily.  . furosemide (LASIX) 40 MG tablet TAKE (1) TABLET BY MOUTH EVERY DAY  . ibuprofen (ADVIL,MOTRIN) 200 MG tablet Take 200 mg by mouth 2 (two) times daily.  . nadolol (CORGARD) 20 MG tablet Take 1 tablet (20 mg total) by mouth daily.  Marland Kitchen spironolactone (ALDACTONE) 25 MG tablet Take 1 tablet (25 mg total) by mouth daily.  . vitamin E 400 UNIT capsule Take 400 Units by mouth daily.  . [DISCONTINUED] Calcium Carbonate-Vitamin D (CALCIUM + D PO) Take by mouth daily. Reported on 06/07/2015    PHQ 2/9 Scores 11/19/2017 11/16/2016 10/16/2016 08/16/2015  PHQ - 2 Score 0 0 0 0    Physical Exam  Constitutional: She is oriented to person, place, and time. She appears well-developed and well-nourished. No distress.  HENT:  Head: Normocephalic and atraumatic.  Right Ear: Tympanic membrane and ear canal normal.  Left Ear: Tympanic membrane and ear canal normal.  Nose: Right sinus exhibits no maxillary sinus tenderness. Left sinus exhibits no maxillary sinus tenderness.  Mouth/Throat: Uvula is midline and oropharynx is clear and moist.  Eyes: Conjunctivae and EOM are normal. Right eye exhibits no discharge. Left eye exhibits no discharge. No scleral icterus.  Neck: Normal range of motion. Carotid bruit is not present. No erythema present. No thyromegaly present.  Cardiovascular: Normal rate, regular rhythm, normal heart sounds, intact distal pulses and normal pulses.  Pulmonary/Chest: Effort normal. No respiratory distress. She has no  wheezes. Right breast exhibits no mass, no nipple discharge, no skin change and no tenderness. Left breast exhibits no mass, no nipple discharge, no skin change and no tenderness.  Abdominal: Soft. Bowel sounds are normal. There is no hepatosplenomegaly. There is no tenderness. There is no CVA tenderness.  Musculoskeletal:       Right hip: She exhibits decreased range of motion and bony tenderness.  Lymphadenopathy:    She has no cervical adenopathy.    She has no axillary adenopathy.  Neurological: She is alert and oriented to person, place, and time. She has normal reflexes. No cranial nerve deficit or sensory deficit.  Skin: Skin is warm, dry and intact. No rash noted.  Psychiatric: She has a normal mood and affect. Her speech is normal and behavior is normal. Thought content normal.  Nursing note and vitals reviewed.   BP 117/78   Pulse 69   Resp 16   Ht 5\' 3"  (1.6 m)   Wt 174 lb (78.9 kg)   SpO2 98%   BMI 30.82 kg/m   Assessment and Plan: 1. Annual physical exam Return in one month for Flu vaccine - Lipid panel - POCT urinalysis dipstick  2. Esophageal varices in cirrhosis (HCC) Controlled, no bleeding - CBC with Differential/Platelet  3. Trochanteric bursitis of right  hip Slowing improving  4. Pre-diabetes Last labs were good Lab Results  Component Value Date   HGBA1C 5.7 (H) 09/29/2017    5. Hx of sinus tachycardia Continue current medications. - Comprehensive metabolic panel - TSH   Partially dictated using Editor, commissioning. Any errors are unintentional.  Halina Maidens, MD [facility]  11/19/2017

## 2017-12-16 ENCOUNTER — Other Ambulatory Visit: Payer: Self-pay | Admitting: Gastroenterology

## 2017-12-16 ENCOUNTER — Telehealth: Payer: Self-pay

## 2017-12-16 NOTE — Telephone Encounter (Signed)
FAXED TO [facility]

## 2017-12-16 NOTE — Telephone Encounter (Signed)
PT left vm to get rx refill on rx Nadalol 20mg  please call pt

## 2017-12-21 DIAGNOSIS — R69 Illness, unspecified: Secondary | ICD-10-CM | POA: Diagnosis not present

## 2018-01-06 ENCOUNTER — Ambulatory Visit
Admission: RE | Admit: 2018-01-06 | Discharge: 2018-01-06 | Disposition: A | Discharge status: Home / Self Care | Payer: Medicare HMO | Source: Ambulatory Visit | Attending: Internal Medicine | Admitting: Internal Medicine

## 2018-01-06 ENCOUNTER — Encounter: Payer: Self-pay | Admitting: Internal Medicine

## 2018-01-06 ENCOUNTER — Ambulatory Visit (INDEPENDENT_AMBULATORY_CARE_PROVIDER_SITE_OTHER): Payer: Medicare HMO | Admitting: Internal Medicine

## 2018-01-06 VITALS — BP 136/84 | HR 92 | Ht 63.0 in | Wt 177.0 lb

## 2018-01-06 DIAGNOSIS — M545 Low back pain, unspecified: Secondary | ICD-10-CM

## 2018-01-06 DIAGNOSIS — M4854XA Collapsed vertebra, not elsewhere classified, thoracic region, initial encounter for fracture: Secondary | ICD-10-CM | POA: Insufficient documentation

## 2018-01-06 DIAGNOSIS — M47816 Spondylosis without myelopathy or radiculopathy, lumbar region: Secondary | ICD-10-CM | POA: Diagnosis not present

## 2018-01-06 NOTE — Progress Notes (Signed)
Date:  01/06/2018   Name:  Jenna Franklin   DOB:  April 07, 1940   MRN:  [id number]   Chief Complaint: Back Pain (Patient is with friend Charlette today. X 1 week ago- Lifted chair from one room to another and hurt back. Difficulty walking and bending. Back feels like it is tightning. Had a fall last night - was getting out of the bed. Friend is helping her set up with life alert so she can contact them if she has another fall. Pt struggled to get to the phone to call for help after fall. ) Clinical Intake:    Pain : 0-10 Pain Score: 10-Worst pain ever Faces Pain Scale: Hurts worst Pain Type: Chronic pain Pain Location: Back Pain Orientation: Medial, Lower Pain Descriptors / Indicators: Squeezing, Spasm, Tightness Pain Onset: In the past 7 days Pain Frequency: Constant  Faces Pain Scale: Hurts worst  BMI - recorded: 31.35 Nutritional Status: BMI > 30  Obese Diabetes: No  Activities of Daily Living: Independent Ambulation: Independent Medication Administration: Independent Home Management: Independent     Do you feel unsafe in your current relationship?: No Do you feel physically threatened by others?: No Anyone hurting you at home, work, or school?: No Unable to ask?: No  How often do you need to have someone help you when you read instructions, pamphlets, or other written materials from your doctor or pharmacy?: 1 - Never  Interpreter Needed?: No      Back Pain  This is a new problem. The current episode started in the past 7 days. The problem occurs constantly. The problem has been gradually improving since onset. The pain is present in the lumbar spine and sacro-iliac. The pain does not radiate. The pain is moderate. Pertinent negatives include no abdominal pain, chest pain or fever.  The initial injury was while lifting a heavy chair.  She was improving with advil and flexeril and ice.  Last night she fell out of bed and re-injured her back.  Review of Systems    Constitutional: Negative for chills, fatigue and fever.  Respiratory: Negative for chest tightness, shortness of breath and wheezing.   Cardiovascular: Negative for chest pain and palpitations.  Gastrointestinal: Negative for abdominal pain.  Musculoskeletal: Positive for back pain and myalgias.  Psychiatric/Behavioral: Negative for dysphoric mood and sleep disturbance.    Patient Active Problem List   Diagnosis Date Noted  . Hx of sinus tachycardia 07/06/2017  . Gastritis without bleeding   . Tendinopathy of right gluteus medius 01/01/2017  . Neck muscle spasm 11/18/2016  . Esophageal varices in cirrhosis (HCC)   . Cirrhosis of liver with ascites ([place]) 05/29/2015  . Post-menopausal atrophic vaginitis 05/29/2015  . Acid reflux 03/25/2015  . Calculus of kidney 03/25/2015  . Tachycardia 03/25/2015  . Pre-diabetes 09/20/2014  . Adjustment disorder with anxious mood 10/05/2013  . Bursitis, trochanteric 12/11/2011    Allergies  Allergen Reactions  . Baclofen Anxiety  . Codeine Nausea And Vomiting  . Prednisone Anxiety    Past Surgical History:  Procedure Laterality Date  . ABDOMINAL HYSTERECTOMY    . APPENDECTOMY    . BREAST BIOPSY Right    neg needle bx  . COMBINED HYSTERECTOMY ABDOMINAL W/ A&P REPAIR / OOPHORECTOMY    . ESOPHAGOGASTRODUODENOSCOPY (EGD) WITH PROPOFOL N/A 06/17/2015   Procedure: ESOPHAGOGASTRODUODENOSCOPY (EGD) WITH PROPOFOL;  Surgeon: Lucilla Lame, MD;  Location: [place];  Service: Endoscopy;  Laterality: N/A;  . ESOPHAGOGASTRODUODENOSCOPY (EGD) WITH PROPOFOL N/A 06/17/2017  Procedure: ESOPHAGOGASTRODUODENOSCOPY (EGD) WITH PROPOFOL;  Surgeon: Lucilla Lame, MD;  Location: [place];  Service: Endoscopy;  Laterality: N/A;  . LAPAROSCOPIC CHOLECYSTECTOMY    . TONSILLECTOMY      Social History   Tobacco Use  . Smoking status: Never Smoker  . Smokeless tobacco: Never Used  . Tobacco comment: smoking cessation materials not required   Substance Use Topics  . Alcohol use: No    Alcohol/week: 0.0 standard drinks  . Drug use: No     Medication list has been reviewed and updated.  Current Meds  Medication Sig  . cyclobenzaprine (FLEXERIL) 10 MG tablet Take 1 tablet (10 mg total) by mouth 3 (three) times daily as needed for muscle spasms. (Patient taking differently: Take 10 mg by mouth as needed for muscle spasms. Take1/2 -1 tablet as needed for muscle spasms daily.)  . estradiol (ESTRACE) 0.5 MG tablet Take 1 tablet (0.5 mg total) by mouth daily.  . fluticasone (FLONASE) 50 MCG/ACT nasal spray Place 2 sprays into both nostrils daily.  . furosemide (LASIX) 40 MG tablet TAKE (1) TABLET BY MOUTH EVERY DAY  . ibuprofen (ADVIL,MOTRIN) 200 MG tablet Take 200 mg by mouth 2 (two) times daily.  . nadolol (CORGARD) 20 MG tablet TAKE 1 TABLET BY MOUTH ONCE DAILY  . spironolactone (ALDACTONE) 25 MG tablet Take 1 tablet (25 mg total) by mouth daily.  . vitamin E 400 UNIT capsule Take 400 Units by mouth daily.    PHQ 2/9 Scores 11/19/2017 11/16/2016 10/16/2016 08/16/2015  PHQ - 2 Score 0 0 0 0    Physical Exam  Constitutional: She is oriented to person, place, and time. She appears well-developed. She appears distressed.  HENT:  Head: Normocephalic and atraumatic.  Cardiovascular: Normal rate, regular rhythm and normal heart sounds.  Pulmonary/Chest: Effort normal and breath sounds normal. No respiratory distress.  Musculoskeletal: Normal range of motion.       Lumbar back: She exhibits tenderness and spasm.  Neurological: She is alert and oriented to person, place, and time.  Skin: Skin is warm and dry. No rash noted.  Psychiatric: She has a normal mood and affect. Her speech is normal and behavior is normal. Thought content normal.  Nursing note and vitals reviewed.   BP 136/84 (BP Location: Right Arm, Patient Position: Sitting, Cuff Size: Large)   Pulse 92   Ht 5\' 3"  (1.6 m)   Wt 177 lb (80.3 kg)   SpO2 97%   BMI 31.35  kg/m   Assessment and Plan: 1. Acute bilateral low back pain without sciatica Rule out compression fx Continue Flexeril, Advil and Ice Agree with [facility] service - DG Lumbar Spine 2-3 Views; Future   Partially dictated using Editor, commissioning. Any errors are unintentional.  Halina Maidens, MD [facility]  01/06/2018

## 2018-01-10 ENCOUNTER — Other Ambulatory Visit: Payer: Self-pay

## 2018-01-10 ENCOUNTER — Telehealth: Payer: Self-pay

## 2018-01-10 DIAGNOSIS — K7460 Unspecified cirrhosis of liver: Secondary | ICD-10-CM

## 2018-01-10 DIAGNOSIS — R188 Other ascites: Principal | ICD-10-CM

## 2018-01-10 NOTE — Telephone Encounter (Signed)
Check bmp before we can change anything.

## 2018-01-10 NOTE — Telephone Encounter (Signed)
Pt notified she will need a BMP prior to increasing her medication.

## 2018-01-10 NOTE — Telephone Encounter (Signed)
Pt has swelling in feet, legs, stomach. Would like Ginger to check with Dr. Allen Norris to see if dosage should be increased on spironolactone?? Pt says the increase helped last time. Right now she's taking 50mg .

## 2018-01-12 ENCOUNTER — Telehealth: Payer: Self-pay

## 2018-01-12 NOTE — Telephone Encounter (Signed)
Patient called stating she has severe leg swelling and needs seen Friday. I explained we are closed for Holiday. She said Ginger told her she can not go up on her meds for Cirrhosis unless she has her kidney function checked. I advised her that it may be her liver but if she can not get in car she needs EMS or ride to ER given the severity. She then explained that she was on full dose and half  it and this is when swellnig started but now Allen Norris will not allow her to go back to full dose until seen by PCP/ Advised her upon calling back that she can go back to FULL DOSE and  Follow up w Allen Norris after Holiday. She will go to ER if worsening or no improvement in few days.

## 2018-01-17 ENCOUNTER — Other Ambulatory Visit
Admission: RE | Admit: 2018-01-17 | Discharge: 2018-01-17 | Disposition: A | Discharge status: Home / Self Care | Payer: Medicare HMO | Source: Ambulatory Visit | Attending: Gastroenterology | Admitting: Gastroenterology

## 2018-01-17 ENCOUNTER — Telehealth: Payer: Self-pay | Admitting: Gastroenterology

## 2018-01-17 ENCOUNTER — Encounter: Payer: Self-pay | Admitting: Gastroenterology

## 2018-01-17 ENCOUNTER — Other Ambulatory Visit: Payer: Self-pay

## 2018-01-17 ENCOUNTER — Ambulatory Visit (INDEPENDENT_AMBULATORY_CARE_PROVIDER_SITE_OTHER): Payer: Medicare HMO | Admitting: Gastroenterology

## 2018-01-17 VITALS — BP 134/81 | HR 78 | Ht 63.0 in | Wt 172.4 lb

## 2018-01-17 DIAGNOSIS — R188 Other ascites: Secondary | ICD-10-CM | POA: Insufficient documentation

## 2018-01-17 DIAGNOSIS — K7460 Unspecified cirrhosis of liver: Secondary | ICD-10-CM

## 2018-01-17 LAB — COMPREHENSIVE METABOLIC PANEL
ALT: 37 U/L (ref 0–44)
AST: 36 U/L (ref 15–41)
Albumin: 4.2 g/dL (ref 3.5–5.0)
Alkaline Phosphatase: 109 U/L (ref 38–126)
Anion gap: 8 (ref 5–15)
BUN: 16 mg/dL (ref 8–23)
CHLORIDE: 100 mmol/L (ref 98–111)
CO2: 28 mmol/L (ref 22–32)
Calcium: 9.1 mg/dL (ref 8.9–10.3)
Creatinine, Ser: 0.97 mg/dL (ref 0.44–1.00)
GFR calc non Af Amer: 56 mL/min — ABNORMAL LOW (ref >60)
Glucose, Bld: 97 mg/dL (ref 70–99)
POTASSIUM: 3.7 mmol/L (ref 3.5–5.1)
Sodium: 136 mmol/L (ref 135–145)
TOTAL PROTEIN: 7.9 g/dL (ref 6.5–8.1)
Total Bilirubin: 1.5 mg/dL — ABNORMAL HIGH (ref 0.3–1.2)

## 2018-01-17 NOTE — Telephone Encounter (Signed)
Pt daughter is calling to add Liver levels to blood work that has been ordered she also would like an apt for pt soon please call her at [phone] she states pt is swollen

## 2018-01-17 NOTE — Telephone Encounter (Signed)
Pt has been scheduled for a follow up appt due to recent increased swelling in her legs and abdomen. Pt will get labs before coming to appt today.

## 2018-01-17 NOTE — Progress Notes (Signed)
Primary Care Physician: Glean Hess, MD  Primary Gastroenterologist:  Dr. Lucilla Lame  Chief Complaint  Patient presents with  . follow up cirrhosis    HPI: Jenna Franklin is a 77 y.o. female here for follow-up of her cirrhosis.  The patient was added to the schedule today after the family reported that the patient has been having worsening of her swelling.  The patient was supposed to have labs checked prior to having any change in her medications so that she does not go into renal failure or have hepatorenal syndrome.  She called her primary care provider and from the notes appears to have told her just to go up on her medications without obtaining baseline kidney function tests.  The patient continued to have symptoms and was told to follow-up with me after the holidays.  The patient's family called today and was added to the schedule. The patient's daughter states of the patient was taking a large amount of anti-inflammatory medication for back pain which she has had for the last month.  During that time the patient started to have abdominal swelling and lower extremity swelling.  These medications were stopped and the patient's swelling went away.  The last time the patient reported that she had abdominal swelling she was sent for an ultrasound that did not show any ascites.  Current Outpatient Medications  Medication Sig Dispense Refill  . cyclobenzaprine (FLEXERIL) 10 MG tablet Take 1 tablet (10 mg total) by mouth 3 (three) times daily as needed for muscle spasms. (Patient taking differently: Take 10 mg by mouth as needed for muscle spasms. Take1/2 -1 tablet as needed for muscle spasms daily.) 30 tablet 2  . estradiol (ESTRACE) 0.5 MG tablet Take 1 tablet (0.5 mg total) by mouth daily. 90 tablet 3  . fluticasone (FLONASE) 50 MCG/ACT nasal spray Place 2 sprays into both nostrils daily. 16 g 2  . furosemide (LASIX) 40 MG tablet TAKE (1) TABLET BY MOUTH EVERY DAY 90 tablet 3  .  ibuprofen (ADVIL,MOTRIN) 200 MG tablet Take 200 mg by mouth 2 (two) times daily.    . nadolol (CORGARD) 20 MG tablet TAKE 1 TABLET BY MOUTH ONCE DAILY 90 tablet 1  . spironolactone (ALDACTONE) 25 MG tablet Take 1 tablet (25 mg total) by mouth daily. 30 tablet 1  . vitamin E 400 UNIT capsule Take 400 Units by mouth daily.     No current facility-administered medications for this visit.     Allergies as of 01/17/2018 - Review Complete 01/17/2018  Allergen Reaction Noted  . Baclofen Anxiety 02/18/2016  . Codeine Nausea And Vomiting 09/28/2013  . Prednisone Anxiety 11/16/2016    ROS:  General: Negative for anorexia, weight loss, fever, chills, fatigue, weakness. ENT: Negative for hoarseness, difficulty swallowing , nasal congestion. CV: Negative for chest pain, angina, palpitations, dyspnea on exertion, peripheral edema.  Respiratory: Negative for dyspnea at rest, dyspnea on exertion, cough, sputum, wheezing.  GI: See history of present illness. GU:  Negative for dysuria, hematuria, urinary incontinence, urinary frequency, nocturnal urination.  Endo: Negative for unusual weight change.    Physical Examination:   BP 134/81   Pulse 78   Ht 5\' 3"  (1.6 m)   Wt 172 lb 6.4 oz (78.2 kg)   BMI 30.54 kg/m   General: Well-nourished, well-developed in no acute distress.  Eyes: No icterus. Conjunctivae pink. Mouth: Oropharyngeal mucosa moist and pink , no lesions erythema or exudate. Lungs: Clear to auscultation bilaterally. Non-labored. Heart:  Regular rate and rhythm, no murmurs rubs or gallops.  Abdomen: Bowel sounds are normal, nontender, nondistended, no hepatosplenomegaly or masses, no abdominal bruits or hernia , no rebound or guarding.   Extremities: No lower extremity edema. No clubbing or deformities. Neuro: Alert and oriented x 3.  Grossly intact. Skin: Warm and dry, no jaundice.   Psych: Alert and cooperative, normal mood and affect.  Labs:    Imaging Studies: Dg Lumbar  Spine 2-3 Views  Result Date: 01/06/2018 CLINICAL DATA:  Pt states she has been having stiffness and pain in lower lumbar region 1 week ago when she picked up [facility] chair and felt pain immediately. Also, pt states she went to the floor last pm trying to get out of bed and pain was so bad she crawled around on the floor for 2 hours trying to make it to the phone. History of inflammation in the RIGHT hip 1 year ago. EXAM: LUMBAR SPINE - 2-3 VIEW COMPARISON:  04/21/2006, 04/05/2006 FINDINGS: There is moderate degenerative change within the facets of the LOWER lumbar spine. There is a wedge compression fracture of T12 which is possibly acute and results in 25% loss of anterior height. No suspicious lytic or blastic lesions are identified. There is a significant stool burden throughout the colon. Suspect fecal impaction. IMPRESSION: Anterior wedge compression fracture of T12, possibly acute and new since studies in 2008. Suspect fecal impaction. Electronically Signed   By: Nolon Nations M.D.   On: 01/06/2018 12:58    Assessment and Plan:   Jenna Franklin is a 77 y.o. y/o female with a history of cirrhosis who had abdominal swelling and lower extremity swelling. The patient does not have any sign of hypoalbuminemia or anasarca.  She also does not have any sign of ascites.  The patient has once again been told the dangers of taking NSAIDs with cirrhosis and how it can lead to hepatorenal syndrome despite being told by other physician she can take it.  She has been told to take Tylenol as prescribed on the bottle and not more than that.  The patient is now back to baseline and is on the lower dose of diuretics that she had been on prior to calling her PCP.  Her labs today show her creatinine to be on the upper limit of normal.  The patient and her daughter have been explained the plan and agree with it.    Lucilla Lame, MD. [facility]   Note: This dictation was prepared with Dragon dictation along with  smaller phrase technology. Any transcriptional errors that result from this process are unintentional.

## 2018-01-19 ENCOUNTER — Other Ambulatory Visit: Payer: Self-pay | Admitting: Internal Medicine

## 2018-02-17 DIAGNOSIS — H8112 Benign paroxysmal vertigo, left ear: Secondary | ICD-10-CM | POA: Diagnosis not present

## 2018-02-21 ENCOUNTER — Telehealth: Payer: Self-pay | Admitting: Gastroenterology

## 2018-02-21 NOTE — Telephone Encounter (Signed)
Spoke with pt regarding Tylenol. Advised pt per Dr. Allen Norris, she can take Tylenol but only as directed on the bottle. She cannot exceed that dosage. Pt advised not to take any NSAIDS. Pt verbalized understanding.

## 2018-02-21 NOTE — Telephone Encounter (Signed)
Pt left vm she states last time she saw Dr. Allen Norris he was upset she was taking advil for pain she has Liver disease and has taken a bad fall she would like to see if she can take a tylenol for her pain please call pt

## 2018-02-24 DIAGNOSIS — H8112 Benign paroxysmal vertigo, left ear: Secondary | ICD-10-CM | POA: Diagnosis not present

## 2018-03-01 ENCOUNTER — Encounter: Payer: Self-pay | Admitting: Internal Medicine

## 2018-03-01 ENCOUNTER — Ambulatory Visit (INDEPENDENT_AMBULATORY_CARE_PROVIDER_SITE_OTHER): Payer: Medicare HMO | Admitting: Internal Medicine

## 2018-03-01 VITALS — BP 128/70 | HR 86 | Ht 63.0 in | Wt 168.2 lb

## 2018-03-01 DIAGNOSIS — K7460 Unspecified cirrhosis of liver: Secondary | ICD-10-CM | POA: Diagnosis not present

## 2018-03-01 DIAGNOSIS — Z23 Encounter for immunization: Secondary | ICD-10-CM

## 2018-03-01 DIAGNOSIS — H00025 Hordeolum internum left lower eyelid: Secondary | ICD-10-CM | POA: Diagnosis not present

## 2018-03-01 DIAGNOSIS — R188 Other ascites: Secondary | ICD-10-CM | POA: Diagnosis not present

## 2018-03-01 MED ORDER — CIPROFLOXACIN HCL 0.3 % OP SOLN: 2.0000 [drp] | Freq: Three times a day (TID) | OPHTHALMIC | 0 refills | Status: DC

## 2018-03-01 NOTE — Patient Instructions (Signed)

## 2018-03-01 NOTE — Progress Notes (Signed)
Date:  03/01/2018   Name:  Jenna Franklin   DOB:  [birth year]   MRN:  [id number]   Chief Complaint: Stye (Left eye. Noticed it over the weekend. Eye was itching and burning. Then noticed a bump on upper eyelid. ); Dizziness (Having dizziness on and off. Seeing Audiologist for third treatment. Right side is cleared up but left ear is still causing dizziness. ); and Immunizations (Needs 23 today. )  Eye Problem   The left eye is affected. This is a new problem. The current episode started in the past 7 days. There was no injury mechanism. The pain is mild. Associated symptoms include eye redness. Pertinent negatives include no fever.    Review of Systems  Constitutional: Positive for fatigue. Negative for chills, fever and unexpected weight change.  HENT: Positive for tinnitus. Negative for hearing loss, postnasal drip, sinus pain and trouble swallowing.   Eyes: Positive for redness.  Respiratory: Negative for cough, chest tightness, shortness of breath and wheezing.   Cardiovascular: Negative for chest pain and palpitations.  Gastrointestinal: Negative for abdominal pain.  Musculoskeletal: Positive for back pain.  Neurological: Positive for dizziness.    Patient Active Problem List   Diagnosis Date Noted  . Hx of sinus tachycardia 07/06/2017  . Gastritis without bleeding   . Tendinopathy of right gluteus medius 01/01/2017  . Neck muscle spasm 11/18/2016  . Esophageal varices in cirrhosis (HCC)   . Cirrhosis of liver with ascites ([place]) 05/29/2015  . Post-menopausal atrophic vaginitis 05/29/2015  . Acid reflux 03/25/2015  . Calculus of kidney 03/25/2015  . Tachycardia 03/25/2015  . Pre-diabetes 09/20/2014  . Adjustment disorder with anxious mood 10/05/2013  . Bursitis, trochanteric 12/11/2011    Allergies  Allergen Reactions  . Baclofen Anxiety  . Codeine Nausea And Vomiting  . Prednisone Anxiety    Past Surgical History:  Procedure Laterality Date  . ABDOMINAL  HYSTERECTOMY    . APPENDECTOMY    . BREAST BIOPSY Right    neg needle bx  . COMBINED HYSTERECTOMY ABDOMINAL W/ A&P REPAIR / OOPHORECTOMY    . ESOPHAGOGASTRODUODENOSCOPY (EGD) WITH PROPOFOL N/A 06/17/2015   Procedure: ESOPHAGOGASTRODUODENOSCOPY (EGD) WITH PROPOFOL;  Surgeon: Lucilla Lame, MD;  Location: [place];  Service: Endoscopy;  Laterality: N/A;  . ESOPHAGOGASTRODUODENOSCOPY (EGD) WITH PROPOFOL N/A 06/17/2017   Procedure: ESOPHAGOGASTRODUODENOSCOPY (EGD) WITH PROPOFOL;  Surgeon: Lucilla Lame, MD;  Location: [place];  Service: Endoscopy;  Laterality: N/A;  . LAPAROSCOPIC CHOLECYSTECTOMY    . TONSILLECTOMY      Social History   Tobacco Use  . Smoking status: Never Smoker  . Smokeless tobacco: Never Used  . Tobacco comment: smoking cessation materials not required  Substance Use Topics  . Alcohol use: No    Alcohol/week: 0.0 standard drinks  . Drug use: No     Medication list has been reviewed and updated.  Current Meds  Medication Sig  . cyclobenzaprine (FLEXERIL) 10 MG tablet Take 1 tablet (10 mg total) by mouth 3 (three) times daily as needed for muscle spasms. (Patient taking differently: Take 10 mg by mouth as needed for muscle spasms. Take1/2 -1 tablet as needed for muscle spasms daily.)  . estradiol (ESTRACE) 0.5 MG tablet Take 1 tablet (0.5 mg total) by mouth daily.  . fluticasone (FLONASE) 50 MCG/ACT nasal spray Place 2 sprays into both nostrils daily.  . furosemide (LASIX) 40 MG tablet TAKE (1) TABLET BY MOUTH EVERY DAY  . nadolol (CORGARD) 20 MG tablet TAKE 1  TABLET BY MOUTH ONCE DAILY  . spironolactone (ALDACTONE) 25 MG tablet Take 1 tablet (25 mg total) by mouth daily.  . vitamin E 400 UNIT capsule Take 400 Units by mouth daily.    PHQ 2/9 Scores 11/19/2017 11/16/2016 10/16/2016 08/16/2015  PHQ - 2 Score 0 0 0 0    Physical Exam Vitals signs and nursing note reviewed.  Constitutional:      General: [name].     Appearance: She is well-developed.  HENT:     Head: Normocephalic and atraumatic.  Eyes:     Pupils: Pupils are equal, round, and reactive to light.   Neck:     Musculoskeletal: Normal range of motion and neck supple.  Cardiovascular:     Rate and Rhythm: Normal rate and regular rhythm.     Pulses: Normal pulses.  Pulmonary:     Effort: Pulmonary effort is normal. No respiratory distress.     Breath sounds: Normal breath sounds.  Musculoskeletal: Normal range of motion.  Skin:    General: Skin is warm and dry.     Findings: No rash.  Neurological:     Mental Status: She is alert and oriented to person, place, and time.  Psychiatric:        Behavior: Behavior normal.        Thought Content: Thought content normal.     BP 128/70 (BP Location: Right Arm, Patient Position: Sitting, Cuff Size: Normal)   Pulse 86   Ht 5\' 3"  (1.6 m)   Wt 168 lb 3.2 oz (76.3 kg)   SpO2 97%   BMI 29.80 kg/m   Assessment and Plan: 1. Hordeolum internum of left lower eyelid Continue warm compresses - ciprofloxacin (CILOXAN) 0.3 % ophthalmic solution; Place 2 drops into both eyes 3 (three) times daily.  Dispense: 10 mL; Refill: 0  2. Cirrhosis of liver with ascites, unspecified hepatic cirrhosis type ([place]) Stable Recent labs normal  3. Need for vaccination for pneumococcus - Pneumococcal polysaccharide vaccine 23-valent greater than or equal to 2yo subcutaneous/IM   Partially dictated using Editor, commissioning. Any errors are unintentional.  Halina Maidens, MD [facility]  03/01/2018

## 2018-03-09 DIAGNOSIS — H8112 Benign paroxysmal vertigo, left ear: Secondary | ICD-10-CM | POA: Diagnosis not present

## 2018-04-01 ENCOUNTER — Ambulatory Visit
Admission: RE | Admit: 2018-04-01 | Discharge: 2018-04-01 | Disposition: A | Discharge status: Home / Self Care | Payer: Medicare HMO | Source: Ambulatory Visit | Attending: Internal Medicine | Admitting: Internal Medicine

## 2018-04-01 ENCOUNTER — Ambulatory Visit
Admission: RE | Admit: 2018-04-01 | Discharge: 2018-04-01 | Disposition: A | Discharge status: Home / Self Care | Payer: Medicare HMO | Attending: Internal Medicine | Admitting: Internal Medicine

## 2018-04-01 ENCOUNTER — Ambulatory Visit: Payer: Medicare HMO

## 2018-04-01 ENCOUNTER — Other Ambulatory Visit: Payer: Self-pay

## 2018-04-01 ENCOUNTER — Ambulatory Visit (INDEPENDENT_AMBULATORY_CARE_PROVIDER_SITE_OTHER): Payer: Medicare HMO | Admitting: Internal Medicine

## 2018-04-01 ENCOUNTER — Encounter: Payer: Self-pay | Admitting: Internal Medicine

## 2018-04-01 VITALS — BP 120/68 | HR 55 | Ht 63.0 in | Wt 171.0 lb

## 2018-04-01 DIAGNOSIS — M62830 Muscle spasm of back: Secondary | ICD-10-CM

## 2018-04-01 DIAGNOSIS — R42 Dizziness and giddiness: Secondary | ICD-10-CM | POA: Diagnosis not present

## 2018-04-01 DIAGNOSIS — I8510 Secondary esophageal varices without bleeding: Secondary | ICD-10-CM

## 2018-04-01 DIAGNOSIS — K7460 Unspecified cirrhosis of liver: Secondary | ICD-10-CM | POA: Diagnosis not present

## 2018-04-01 DIAGNOSIS — S22000S Wedge compression fracture of unspecified thoracic vertebra, sequela: Secondary | ICD-10-CM | POA: Insufficient documentation

## 2018-04-01 DIAGNOSIS — S22080A Wedge compression fracture of T11-T12 vertebra, initial encounter for closed fracture: Secondary | ICD-10-CM | POA: Diagnosis not present

## 2018-04-01 MED ORDER — BACLOFEN 10 MG PO TABS: 10.0000 mg | ORAL_TABLET | Freq: Three times a day (TID) | ORAL | 0 refills | Status: DC

## 2018-04-01 NOTE — Progress Notes (Signed)
Date:  04/01/2018   Name:  Jenna Franklin   DOB:  07-07-1940   MRN:  [id number]   Chief Complaint: Dizziness (Talked to Dr Allen Norris and he said she would need to see pcp for Meclizine. [facility]en [facility] in [place] and they cannot clear vertigo out of left eye.)  Dizziness  This is a recurrent problem. The current episode started more than 1 month ago. The problem occurs constantly. The problem has been gradually improving. Associated symptoms include myalgias and a visual change. Pertinent negatives include no chest pain, chills, coughing, fatigue, fever, nausea or vomiting. Treatments tried: Eply maneuvers have helped the right but not the left. The treatment provided mild relief.  Back Pain  This is a recurrent problem. The current episode started 1 to 4 weeks ago. The problem occurs constantly. The problem is unchanged. The pain is present in the lumbar spine and thoracic spine. The quality of the pain is described as aching. The pain does not radiate. The symptoms are aggravated by lying down, bending and twisting. Pertinent negatives include no chest pain or fever. Risk factors: hx compression fracture; fall at home about 3 weeks ago.  Cirrhosis - stable, not taking any nsaids.  Weight is stable without edema.  No change in abdominal girth or urine output.   Review of Systems  Constitutional: Negative for chills, fatigue and fever.  HENT: Negative for hearing loss and tinnitus.   Respiratory: Negative for cough, chest tightness, shortness of breath and wheezing.   Cardiovascular: Negative for chest pain, palpitations and leg swelling.  Gastrointestinal: Negative for nausea and vomiting.  Musculoskeletal: Positive for back pain and myalgias.  Neurological: Positive for dizziness. Negative for light-headedness.  Hematological: Negative for adenopathy.    Patient Active Problem List   Diagnosis Date Noted  . Thoracic compression fracture, sequela 04/01/2018    . Hx of sinus tachycardia 07/06/2017  . Gastritis without bleeding   . Tendinopathy of right gluteus medius 01/01/2017  . Neck muscle spasm 11/18/2016  . Esophageal varices in cirrhosis (HCC)   . Cirrhosis of liver with ascites ([place]) 05/29/2015  . Post-menopausal atrophic vaginitis 05/29/2015  . Acid reflux 03/25/2015  . Calculus of kidney 03/25/2015  . Tachycardia 03/25/2015  . Pre-diabetes 09/20/2014  . Adjustment disorder with anxious mood 10/05/2013  . Bursitis, trochanteric 12/11/2011    Allergies  Allergen Reactions  . Baclofen Anxiety  . Codeine Nausea And Vomiting  . Prednisone Anxiety    Past Surgical History:  Procedure Laterality Date  . ABDOMINAL HYSTERECTOMY    . APPENDECTOMY    . BREAST BIOPSY Right    neg needle bx  . COMBINED HYSTERECTOMY ABDOMINAL W/ A&P REPAIR / OOPHORECTOMY    . ESOPHAGOGASTRODUODENOSCOPY (EGD) WITH PROPOFOL N/A 06/17/2015   Procedure: ESOPHAGOGASTRODUODENOSCOPY (EGD) WITH PROPOFOL;  Surgeon: Lucilla Lame, MD;  Location: [facility];  Service: Endoscopy;  Laterality: N/A;  . ESOPHAGOGASTRODUODENOSCOPY (EGD) WITH PROPOFOL N/A 06/17/2017   Procedure: ESOPHAGOGASTRODUODENOSCOPY (EGD) WITH PROPOFOL;  Surgeon: Lucilla Lame, MD;  Location: [facility];  Service: Endoscopy;  Laterality: N/A;  . LAPAROSCOPIC CHOLECYSTECTOMY    . TONSILLECTOMY      Social History   Tobacco Use  . Smoking status: Never Smoker  . Smokeless tobacco: Never Used  . Tobacco comment: smoking cessation materials not required  Substance Use Topics  . Alcohol use: No    Alcohol/week: 0.0 standard drinks  . Drug use: No     Medication list  has been reviewed and updated.  Current Meds  Medication Sig  . acetaminophen (TYLENOL) 500 MG tablet Take 500 mg by mouth every 6 (six) hours as needed.  Marland Kitchen estradiol (ESTRACE) 0.5 MG tablet Take 1 tablet (0.5 mg total) by mouth daily.  . fluticasone (FLONASE) 50 MCG/ACT nasal spray Place 2 sprays into both  nostrils daily.  . furosemide (LASIX) 40 MG tablet TAKE (1) TABLET BY MOUTH EVERY DAY  . nadolol (CORGARD) 20 MG tablet TAKE 1 TABLET BY MOUTH ONCE DAILY  . spironolactone (ALDACTONE) 25 MG tablet Take 1 tablet (25 mg total) by mouth daily.  . vitamin E 400 UNIT capsule Take 400 Units by mouth daily.  . [DISCONTINUED] cyclobenzaprine (FLEXERIL) 10 MG tablet Take 1 tablet (10 mg total) by mouth 3 (three) times daily as needed for muscle spasms. (Patient taking differently: Take 10 mg by mouth as needed for muscle spasms. Take1/2 -1 tablet as needed for muscle spasms daily.)    PHQ 2/9 Scores 04/01/2018 11/19/2017 11/16/2016 10/16/2016  PHQ - 2 Score 1 0 0 0    Physical Exam Vitals signs and nursing note reviewed.  Constitutional:      General: [name].    Appearance: Normal appearance. She is well-developed.  HENT:     Head: Normocephalic and atraumatic.     Mouth/Throat:     Mouth: Mucous membranes are moist.  Neck:     Musculoskeletal: Normal range of motion and neck supple.  Cardiovascular:     Rate and Rhythm: Normal rate and regular rhythm.     Pulses: Normal pulses.  Pulmonary:     Effort: Pulmonary effort is normal. No respiratory distress.     Breath sounds: Normal breath sounds. No wheezing or rhonchi.  Musculoskeletal: Normal range of motion.     Lumbar back: She exhibits tenderness and spasm. She exhibits no bony tenderness.       Back:  Skin:    General: Skin is warm and dry.     Findings: No rash.  Neurological:     Mental Status: She is alert and oriented to person, place, and time.  Psychiatric:        Attention and Perception: Attention normal.        Mood and Affect: Mood normal.        Behavior: Behavior normal.        Thought Content: Thought content normal.    Wt Readings from Last 3 Encounters:  04/01/18 171 lb (77.6 kg)  03/01/18 168 lb 3.2 oz (76.3 kg)  01/17/18 172 lb 6.4 oz (78.2 kg)     BP 120/68   Pulse (!) 55   Ht 5\' 3"   (1.6 m)   Wt 171 lb (77.6 kg)   SpO2 (!) 9%   BMI 30.29 kg/m   Assessment and Plan: 1. Spasm of muscle of lower back Possible recurrent fracture Continue tylenol 1000 mg qid Use ice or heat - baclofen (LIORESAL) 10 MG tablet; Take 1 tablet (10 mg total) by mouth 3 (three) times daily.  Dispense: 30 each; Refill: 0  2. Vertigo Has not responded to Eply maneuvers Recommend seeing ENT in consult Will not give Meclizine since it is also sedating and not likely to be of much benefit for current eye sx  3. Thoracic compression fracture, sequela Repeat Xray - DG Thoracic Spine 2 View; Future  4. Esophageal varices in cirrhosis (HCC) Stable, continue current medications   Partially dictated using Editor, commissioning.  Any errors are unintentional.  Halina Maidens, MD [facility]  04/01/2018

## 2018-04-01 NOTE — Patient Instructions (Addendum)
Take Baclofen 10 mg three times a day Continue tylenol 1000 mg 4 times a day Use heat or ice - whichever helps  Schedule a follow up with Dr. Kathyrn Sheriff regarding vertigo

## 2018-04-04 ENCOUNTER — Other Ambulatory Visit: Payer: Self-pay

## 2018-04-04 DIAGNOSIS — S22080G Wedge compression fracture of T11-T12 vertebra, subsequent encounter for fracture with delayed healing: Secondary | ICD-10-CM

## 2018-04-07 DIAGNOSIS — M5416 Radiculopathy, lumbar region: Secondary | ICD-10-CM | POA: Diagnosis not present

## 2018-04-07 DIAGNOSIS — M7061 Trochanteric bursitis, right hip: Secondary | ICD-10-CM | POA: Diagnosis not present

## 2018-04-07 DIAGNOSIS — M545 Low back pain: Secondary | ICD-10-CM | POA: Diagnosis not present

## 2018-04-12 ENCOUNTER — Other Ambulatory Visit: Payer: Self-pay | Admitting: Physician Assistant

## 2018-04-12 DIAGNOSIS — M545 Low back pain, unspecified: Secondary | ICD-10-CM

## 2018-04-14 ENCOUNTER — Other Ambulatory Visit: Payer: Self-pay | Admitting: Internal Medicine

## 2018-04-14 DIAGNOSIS — N952 Postmenopausal atrophic vaginitis: Secondary | ICD-10-CM

## 2018-04-14 DIAGNOSIS — K7460 Unspecified cirrhosis of liver: Secondary | ICD-10-CM

## 2018-04-14 DIAGNOSIS — R188 Other ascites: Principal | ICD-10-CM

## 2018-04-17 ENCOUNTER — Ambulatory Visit: Payer: Medicare HMO

## 2018-04-20 ENCOUNTER — Ambulatory Visit
Admission: RE | Admit: 2018-04-20 | Discharge: 2018-04-20 | Disposition: A | Discharge status: Home / Self Care | Payer: Medicare HMO | Source: Ambulatory Visit | Attending: Physician Assistant | Admitting: Physician Assistant

## 2018-04-20 ENCOUNTER — Other Ambulatory Visit: Payer: Self-pay

## 2018-04-20 DIAGNOSIS — M545 Low back pain, unspecified: Secondary | ICD-10-CM

## 2018-05-03 ENCOUNTER — Other Ambulatory Visit: Payer: Self-pay | Admitting: Internal Medicine

## 2018-05-03 DIAGNOSIS — K7460 Unspecified cirrhosis of liver: Secondary | ICD-10-CM

## 2018-05-03 DIAGNOSIS — R188 Other ascites: Principal | ICD-10-CM

## 2018-05-23 ENCOUNTER — Ambulatory Visit: Payer: Medicare HMO | Admitting: Internal Medicine

## 2018-06-13 ENCOUNTER — Other Ambulatory Visit: Payer: Self-pay

## 2018-06-13 ENCOUNTER — Telehealth: Payer: Self-pay | Admitting: Gastroenterology

## 2018-06-13 MED ORDER — NADOLOL 20 MG PO TABS: 20.0000 mg | ORAL_TABLET | Freq: Every day | ORAL | 1 refills | Status: DC

## 2018-06-13 NOTE — Telephone Encounter (Signed)
Refill for Nadolol has been sent to [facility] per pt request.

## 2018-06-13 NOTE — Telephone Encounter (Signed)
Pt is calling for refill on rx Nadolol 20 mg ( pt spelled rx out) qty 90 send to [facility] [place]  She only has 7 Pills left

## 2018-06-30 ENCOUNTER — Telehealth: Payer: Self-pay

## 2018-06-30 NOTE — Telephone Encounter (Signed)
Patient wants advise. Was told by her bone doctor that her fracture is healing well but her narrowing is getting worse.   Wants to know if she can get a refill on her FLEXERIL and wants to know if you think this will help with narrowing of the spine.....  Please Advise.

## 2018-06-30 NOTE — Telephone Encounter (Signed)
Flexeril will not help with her spinal problems.  It is a muscle relaxer and is not covered by Medicare.

## 2018-06-30 NOTE — Telephone Encounter (Signed)
Patient informed. Told her she needs to follow there guidelines.

## 2018-07-06 DIAGNOSIS — R262 Difficulty in walking, not elsewhere classified: Secondary | ICD-10-CM | POA: Diagnosis not present

## 2018-07-06 DIAGNOSIS — M5416 Radiculopathy, lumbar region: Secondary | ICD-10-CM | POA: Diagnosis not present

## 2018-07-08 ENCOUNTER — Other Ambulatory Visit: Payer: Self-pay

## 2018-07-08 ENCOUNTER — Ambulatory Visit (INDEPENDENT_AMBULATORY_CARE_PROVIDER_SITE_OTHER): Payer: Medicare HMO | Admitting: Internal Medicine

## 2018-07-08 ENCOUNTER — Encounter: Payer: Self-pay | Admitting: Internal Medicine

## 2018-07-08 VITALS — BP 117/79 | HR 90 | Temp 97.9°F | Resp 16 | Ht 63.0 in | Wt 172.0 lb

## 2018-07-08 DIAGNOSIS — R300 Dysuria: Secondary | ICD-10-CM | POA: Diagnosis not present

## 2018-07-08 LAB — POC URINALYSIS WITH MICROSCOPIC (NON AUTO)MANUAL RESULT
Bilirubin, UA: NEGATIVE
Crystals: 0
Glucose, UA: NEGATIVE
Ketones, UA: NEGATIVE
Mucus, UA: 0
Nitrite, UA: POSITIVE
Protein, UA: NEGATIVE
RBC: 0 M/uL — AB (ref 4.04–5.48)
Spec Grav, UA: 1.015 (ref 1.010–1.025)
Urobilinogen, UA: 0.2 E.U./dL
WBC Casts, UA: 5
pH, UA: 5 (ref 5.0–8.0)

## 2018-07-08 MED ORDER — CIPROFLOXACIN HCL 250 MG PO TABS: 250.0000 mg | ORAL_TABLET | Freq: Two times a day (BID) | ORAL | 0 refills | Status: DC

## 2018-07-08 NOTE — Progress Notes (Signed)
Date:  07/08/2018   Name:  Jenna Franklin   DOB:  09/28/1940   MRN:  [id number]   Chief Complaint: No chief complaint on file.  Urinary Tract Infection   This is a new problem. The current episode started yesterday. The problem occurs every urination. The problem has been gradually worsening. The quality of the pain is described as burning. The pain is moderate. There has been no fever. Associated symptoms include flank pain, frequency and urgency. Pertinent negatives include no chills, hematuria or hesitancy.    Review of Systems  Constitutional: Negative for chills, fatigue and fever.  Respiratory: Negative for chest tightness, shortness of breath and wheezing.   Cardiovascular: Negative for chest pain, palpitations and leg swelling.  Genitourinary: Positive for flank pain, frequency and urgency. Negative for hematuria and hesitancy.  Musculoskeletal: Negative for arthralgias.  Neurological: Negative for dizziness, light-headedness and headaches.  Psychiatric/Behavioral: Negative for dysphoric mood. The patient is not nervous/anxious.     Patient Active Problem List   Diagnosis Date Noted  . Thoracic compression fracture, sequela 04/01/2018  . Hx of sinus tachycardia 07/06/2017  . Gastritis without bleeding   . Tendinopathy of right gluteus medius 01/01/2017  . Neck muscle spasm 11/18/2016  . Esophageal varices in cirrhosis (HCC)   . Cirrhosis of liver with ascites ([place]) 05/29/2015  . Post-menopausal atrophic vaginitis 05/29/2015  . Acid reflux 03/25/2015  . Calculus of kidney 03/25/2015  . Tachycardia 03/25/2015  . Pre-diabetes 09/20/2014  . Adjustment disorder with anxious mood 10/05/2013  . Bursitis, trochanteric 12/11/2011    Allergies  Allergen Reactions  . Baclofen Anxiety  . Codeine Nausea And Vomiting  . Prednisone Anxiety    Past Surgical History:  Procedure Laterality Date  . ABDOMINAL HYSTERECTOMY    . APPENDECTOMY    . BREAST BIOPSY Right    neg  needle bx  . COMBINED HYSTERECTOMY ABDOMINAL W/ A&P REPAIR / OOPHORECTOMY    . ESOPHAGOGASTRODUODENOSCOPY (EGD) WITH PROPOFOL N/A 06/17/2015   Procedure: ESOPHAGOGASTRODUODENOSCOPY (EGD) WITH PROPOFOL;  Surgeon: Lucilla Lame, MD;  Location: [facility];  Service: Endoscopy;  Laterality: N/A;  . ESOPHAGOGASTRODUODENOSCOPY (EGD) WITH PROPOFOL N/A 06/17/2017   Procedure: ESOPHAGOGASTRODUODENOSCOPY (EGD) WITH PROPOFOL;  Surgeon: Lucilla Lame, MD;  Location: [facility];  Service: Endoscopy;  Laterality: N/A;  . LAPAROSCOPIC CHOLECYSTECTOMY    . TONSILLECTOMY      Social History   Tobacco Use  . Smoking status: Never Smoker  . Smokeless tobacco: Never Used  . Tobacco comment: smoking cessation materials not required  Substance Use Topics  . Alcohol use: No    Alcohol/week: 0.0 standard drinks  . Drug use: No     Medication list has been reviewed and updated.  Current Meds  Medication Sig  . acetaminophen (TYLENOL) 500 MG tablet Take 500 mg by mouth every 6 (six) hours as needed.  Marland Kitchen estradiol (ESTRACE) 0.5 MG tablet TAKE ONE (1) TABLET BY MOUTH ONCE DAILY  . fluticasone (FLONASE) 50 MCG/ACT nasal spray Place 2 sprays into both nostrils daily.  . furosemide (LASIX) 40 MG tablet TAKE ONE (1) TABLET BY MOUTH ONCE DAILY  . nadolol (CORGARD) 20 MG tablet Take 1 tablet (20 mg total) by mouth daily.  Marland Kitchen spironolactone (ALDACTONE) 50 MG tablet TAKE ONE (1) TABLET BY MOUTH ONCE DAILY  . vitamin E 400 UNIT capsule Take 400 Units by mouth daily.  . [DISCONTINUED] baclofen (LIORESAL) 10 MG tablet Take 1 tablet (10 mg total) by mouth 3 (three)  times daily.  . [DISCONTINUED] spironolactone (ALDACTONE) 25 MG tablet Take 1 tablet (25 mg total) by mouth daily.    PHQ 2/9 Scores 07/08/2018 04/01/2018 11/19/2017 11/16/2016  PHQ - 2 Score 0 1 0 0  PHQ- 9 Score 0 - - -    BP Readings from Last 3 Encounters:  07/08/18 117/79  04/01/18 120/68  03/01/18 128/70    Physical Exam Vitals signs  and nursing note reviewed.  Constitutional:      Appearance: She is well-developed.  HENT:     Nose: Nose normal.     Mouth/Throat:     Mouth: Mucous membranes are moist.  Eyes:     Pupils: Pupils are equal, round, and reactive to light.  Neck:     Musculoskeletal: Normal range of motion and neck supple.  Cardiovascular:     Rate and Rhythm: Normal rate and regular rhythm.     Pulses: Normal pulses.     Heart sounds: Normal heart sounds.  Pulmonary:     Effort: Pulmonary effort is normal. No respiratory distress.     Breath sounds: Normal breath sounds. No rhonchi.  Abdominal:     General: Bowel sounds are normal.     Palpations: Abdomen is soft.     Tenderness: There is abdominal tenderness in the suprapubic area. There is no guarding or rebound.  Musculoskeletal:     Right lower leg: No edema.     Left lower leg: No edema.  Lymphadenopathy:     Cervical: No cervical adenopathy.     Wt Readings from Last 3 Encounters:  07/08/18 172 lb (78 kg)  04/01/18 171 lb (77.6 kg)  03/01/18 168 lb 3.2 oz (76.3 kg)    BP 117/79   Pulse 90   Temp 97.9 F (36.6 C) (Oral)   Resp 16   Ht 5\' 3"  (1.6 m)   Wt 172 lb (78 kg)   SpO2 96%   BMI 30.47 kg/m   Assessment and Plan: 1. Dysuria Continue fluids Follow up if no improvement - POC urinalysis w microscopic (non auto) - ciprofloxacin (CIPRO) 250 MG tablet; Take 1 tablet (250 mg total) by mouth 2 (two) times daily for 5 days.  Dispense: 10 tablet; Refill: 0   Partially dictated using Editor, commissioning. Any errors are unintentional.  Halina Maidens, MD [facility]  07/08/2018

## 2018-07-12 ENCOUNTER — Telehealth: Payer: Self-pay

## 2018-07-12 ENCOUNTER — Other Ambulatory Visit: Payer: Self-pay | Admitting: Internal Medicine

## 2018-07-12 DIAGNOSIS — N3000 Acute cystitis without hematuria: Secondary | ICD-10-CM

## 2018-07-12 MED ORDER — NITROFURANTOIN MONOHYD MACRO 100 MG PO CAPS: 100.0000 mg | ORAL_CAPSULE | Freq: Two times a day (BID) | ORAL | 0 refills | Status: AC

## 2018-07-12 NOTE — Telephone Encounter (Signed)
Patient informed. 

## 2018-07-12 NOTE — Telephone Encounter (Signed)
New Rx sent to [facility] drug.

## 2018-07-12 NOTE — Telephone Encounter (Signed)
Patient called saying she was seen for dysuria and prescribed 5 days of abx. She said she is still having pressure and pain. Wants to try a diff antibiotics.  Please advise.

## 2018-07-20 DIAGNOSIS — M5416 Radiculopathy, lumbar region: Secondary | ICD-10-CM | POA: Diagnosis not present

## 2018-07-20 DIAGNOSIS — R262 Difficulty in walking, not elsewhere classified: Secondary | ICD-10-CM | POA: Diagnosis not present

## 2018-07-22 DIAGNOSIS — R262 Difficulty in walking, not elsewhere classified: Secondary | ICD-10-CM | POA: Diagnosis not present

## 2018-07-22 DIAGNOSIS — M5416 Radiculopathy, lumbar region: Secondary | ICD-10-CM | POA: Diagnosis not present

## 2018-07-27 ENCOUNTER — Other Ambulatory Visit: Payer: Self-pay

## 2018-07-27 ENCOUNTER — Ambulatory Visit (INDEPENDENT_AMBULATORY_CARE_PROVIDER_SITE_OTHER): Payer: Medicare HMO | Admitting: Internal Medicine

## 2018-07-27 ENCOUNTER — Other Ambulatory Visit
Admission: RE | Admit: 2018-07-27 | Discharge: 2018-07-27 | Disposition: A | Discharge status: Home / Self Care | Payer: Medicare HMO | Attending: Internal Medicine | Admitting: Internal Medicine

## 2018-07-27 ENCOUNTER — Encounter: Payer: Self-pay | Admitting: Internal Medicine

## 2018-07-27 VITALS — BP 126/70 | HR 82 | Ht 63.0 in | Wt 163.0 lb

## 2018-07-27 DIAGNOSIS — R188 Other ascites: Secondary | ICD-10-CM | POA: Insufficient documentation

## 2018-07-27 DIAGNOSIS — K7460 Unspecified cirrhosis of liver: Secondary | ICD-10-CM

## 2018-07-27 DIAGNOSIS — R7303 Prediabetes: Secondary | ICD-10-CM | POA: Insufficient documentation

## 2018-07-27 DIAGNOSIS — R350 Frequency of micturition: Secondary | ICD-10-CM | POA: Diagnosis not present

## 2018-07-27 DIAGNOSIS — M5416 Radiculopathy, lumbar region: Secondary | ICD-10-CM | POA: Diagnosis not present

## 2018-07-27 DIAGNOSIS — M48061 Spinal stenosis, lumbar region without neurogenic claudication: Secondary | ICD-10-CM | POA: Insufficient documentation

## 2018-07-27 DIAGNOSIS — R262 Difficulty in walking, not elsewhere classified: Secondary | ICD-10-CM | POA: Diagnosis not present

## 2018-07-27 LAB — POCT URINALYSIS DIPSTICK
Bilirubin, UA: NEGATIVE
Blood, UA: NEGATIVE
Glucose, UA: NEGATIVE
Ketones, UA: NEGATIVE
Leukocytes, UA: NEGATIVE
Nitrite, UA: NEGATIVE
Protein, UA: NEGATIVE
Spec Grav, UA: 1.015 (ref 1.010–1.025)
Urobilinogen, UA: 0.2 E.U./dL
pH, UA: 5 (ref 5.0–8.0)

## 2018-07-27 LAB — COMPREHENSIVE METABOLIC PANEL
ALT: 49 U/L — ABNORMAL HIGH (ref 0–44)
AST: 55 U/L — ABNORMAL HIGH (ref 15–41)
Albumin: 4.1 g/dL (ref 3.5–5.0)
Alkaline Phosphatase: 99 U/L (ref 38–126)
Anion gap: 9 (ref 5–15)
BUN: 14 mg/dL (ref 8–23)
CO2: 25 mmol/L (ref 22–32)
Calcium: 9.2 mg/dL (ref 8.9–10.3)
Chloride: 102 mmol/L (ref 98–111)
Creatinine, Ser: 0.81 mg/dL (ref 0.44–1.00)
GFR calc Af Amer: >60 mL/min (ref >60)
GFR calc non Af Amer: >60 mL/min (ref >60)
Glucose, Bld: 111 mg/dL — ABNORMAL HIGH (ref 70–99)
Potassium: 3.8 mmol/L (ref 3.5–5.1)
Sodium: 136 mmol/L (ref 135–145)
Total Bilirubin: 1.4 mg/dL — ABNORMAL HIGH (ref 0.3–1.2)
Total Protein: 8.2 g/dL — ABNORMAL HIGH (ref 6.5–8.1)

## 2018-07-27 LAB — HEMOGLOBIN A1C
Hgb A1c MFr Bld: 6 % — ABNORMAL HIGH (ref 4.8–5.6)
Mean Plasma Glucose: 125.5 mg/dL

## 2018-07-27 NOTE — Progress Notes (Signed)
Date:  07/27/2018   Name:  Jenna Franklin   DOB:  1941-02-13   MRN:  [id number]   Chief Complaint: Diabetes (Pre from august 2019 patient had A1C of 5.7. Told her we are rechecking this today.) and Urinary Urgency (Patient feels like she keeps having urgency to go the last few days. )  Diabetes  She presents for her follow-up diabetic visit. Diabetes type: pre-diabetes. Her disease course has been stable. Pertinent negatives for hypoglycemia include no dizziness, headaches, nervousness/anxiousness or tremors. Pertinent negatives for diabetes include no chest pain and no fatigue.  Urinary Frequency   This is a recurrent problem. The problem has been unchanged. The patient is experiencing no pain. There has been no fever. She is not sexually active. Associated symptoms include frequency and urgency. Pertinent negatives include no chills or hematuria. Treatments tried: recent UTI - treated.  Cirrhosis - stable without any new symptoms.  Still avoiding all nsaids.  Weight is stable.  Her appetite is good.  No jaundice or change in skin or urine color. Spinal stenosis - recently diagnosed with MRI.  She has started PT to help with core strengthening and muscle tightness.  Review of Systems  Constitutional: Negative for chills, fatigue and unexpected weight change.  HENT: Negative for trouble swallowing.   Eyes: Negative for visual disturbance.  Respiratory: Negative for chest tightness, shortness of breath and wheezing.   Cardiovascular: Negative for chest pain and palpitations.  Gastrointestinal: Negative for blood in stool, constipation and diarrhea.  Genitourinary: Positive for frequency and urgency. Negative for dysuria and hematuria.  Musculoskeletal: Positive for back pain and gait problem.  Skin: Negative for color change and rash.  Allergic/Immunologic: Negative for environmental allergies.  Neurological: Negative for dizziness, tremors, light-headedness and headaches.   Psychiatric/Behavioral: Negative for sleep disturbance. The patient is not nervous/anxious.     Patient Active Problem List   Diagnosis Date Noted  . Thoracic compression fracture, sequela 04/01/2018  . Hx of sinus tachycardia 07/06/2017  . Gastritis without bleeding   . Tendinopathy of right gluteus medius 01/01/2017  . Neck muscle spasm 11/18/2016  . Esophageal varices in cirrhosis (HCC)   . Cirrhosis of liver with ascites ([place]) 05/29/2015  . Post-menopausal atrophic vaginitis 05/29/2015  . Acid reflux 03/25/2015  . Calculus of kidney 03/25/2015  . Pre-diabetes 09/20/2014  . Adjustment disorder with anxious mood 10/05/2013  . Bursitis, trochanteric 12/11/2011    Allergies  Allergen Reactions  . Baclofen Anxiety  . Codeine Nausea And Vomiting  . Prednisone Anxiety    Past Surgical History:  Procedure Laterality Date  . ABDOMINAL HYSTERECTOMY    . APPENDECTOMY    . BREAST BIOPSY Right    neg needle bx  . COMBINED HYSTERECTOMY ABDOMINAL W/ A&P REPAIR / OOPHORECTOMY    . ESOPHAGOGASTRODUODENOSCOPY (EGD) WITH PROPOFOL N/A 06/17/2015   Procedure: ESOPHAGOGASTRODUODENOSCOPY (EGD) WITH PROPOFOL;  Surgeon: Lucilla Lame, MD;  Location: [facility];  Service: Endoscopy;  Laterality: N/A;  . ESOPHAGOGASTRODUODENOSCOPY (EGD) WITH PROPOFOL N/A 06/17/2017   Procedure: ESOPHAGOGASTRODUODENOSCOPY (EGD) WITH PROPOFOL;  Surgeon: Lucilla Lame, MD;  Location: [facility];  Service: Endoscopy;  Laterality: N/A;  . LAPAROSCOPIC CHOLECYSTECTOMY    . TONSILLECTOMY      Social History   Tobacco Use  . Smoking status: Never Smoker  . Smokeless tobacco: Never Used  . Tobacco comment: smoking cessation materials not required  Substance Use Topics  . Alcohol use: No    Alcohol/week: 0.0 standard drinks  .  Drug use: No     Medication list has been reviewed and updated.  Current Meds  Medication Sig  . acetaminophen (TYLENOL) 500 MG tablet Take 500 mg by mouth every 6 (six)  hours as needed.  Marland Kitchen estradiol (ESTRACE) 0.5 MG tablet TAKE ONE (1) TABLET BY MOUTH ONCE DAILY  . fluticasone (FLONASE) 50 MCG/ACT nasal spray Place 2 sprays into both nostrils daily.  . furosemide (LASIX) 40 MG tablet TAKE ONE (1) TABLET BY MOUTH ONCE DAILY  . nadolol (CORGARD) 20 MG tablet Take 1 tablet (20 mg total) by mouth daily.  Marland Kitchen spironolactone (ALDACTONE) 50 MG tablet TAKE ONE (1) TABLET BY MOUTH ONCE DAILY  . vitamin E 400 UNIT capsule Take 400 Units by mouth daily.    PHQ 2/9 Scores 07/27/2018 07/08/2018 04/01/2018 11/19/2017  PHQ - 2 Score 0 0 1 0  PHQ- 9 Score 3 0 - -    BP Readings from Last 3 Encounters:  07/27/18 126/70  07/08/18 117/79  04/01/18 120/68    Physical Exam Vitals signs and nursing note reviewed.  Constitutional:      General: [name].    Appearance: Normal appearance. She is well-developed.  HENT:     Head: Normocephalic and atraumatic.  Cardiovascular:     Rate and Rhythm: Normal rate and regular rhythm.     Pulses: Normal pulses.     Heart sounds: No murmur.  Pulmonary:     Effort: Pulmonary effort is normal. No respiratory distress.     Breath sounds: No wheezing or rhonchi.  Abdominal:     Palpations: Abdomen is soft. There is no mass.     Tenderness: There is no abdominal tenderness. There is no guarding or rebound.  Musculoskeletal: Normal range of motion.     Right lower leg: No edema.     Left lower leg: No edema.  Lymphadenopathy:     Cervical: No cervical adenopathy.  Skin:    General: Skin is warm and dry.     Capillary Refill: Capillary refill takes less than 2 seconds.     Findings: No rash.  Neurological:     Mental Status: She is alert and oriented to person, place, and time.  Psychiatric:        Behavior: Behavior normal.        Thought Content: Thought content normal.     Wt Readings from Last 3 Encounters:  07/27/18 163 lb (73.9 kg)  07/08/18 172 lb (78 kg)  04/01/18 171 lb (77.6 kg)    BP  126/70   Pulse 82   Ht 5\' 3"  (1.6 m)   Wt 163 lb (73.9 kg)   SpO2 96%   BMI 28.87 kg/m   Assessment and Plan: 1. Pre-diabetes Continue healthy diet - Hemoglobin A1c  2. Cirrhosis of liver with ascites, unspecified hepatic cirrhosis type ([place]) Stable with urinary frequency due to diuretics - Comprehensive metabolic panel  3. Spinal stenosis of lumbar region without neurogenic claudication Undergoing PTx Seeing Ortho  4. Urinary frequency UA is negative - suspect this is secondary to diuretic use and OAB - POCT urinalysis dipstick   Partially dictated using Dragon software. Any errors are unintentional.  Halina Maidens, MD [facility]  07/27/2018

## 2018-07-29 DIAGNOSIS — R262 Difficulty in walking, not elsewhere classified: Secondary | ICD-10-CM | POA: Diagnosis not present

## 2018-07-29 DIAGNOSIS — M5416 Radiculopathy, lumbar region: Secondary | ICD-10-CM | POA: Diagnosis not present

## 2018-08-03 DIAGNOSIS — R262 Difficulty in walking, not elsewhere classified: Secondary | ICD-10-CM | POA: Diagnosis not present

## 2018-08-03 DIAGNOSIS — M5416 Radiculopathy, lumbar region: Secondary | ICD-10-CM | POA: Diagnosis not present

## 2018-08-05 DIAGNOSIS — M5416 Radiculopathy, lumbar region: Secondary | ICD-10-CM | POA: Diagnosis not present

## 2018-08-05 DIAGNOSIS — R262 Difficulty in walking, not elsewhere classified: Secondary | ICD-10-CM | POA: Diagnosis not present

## 2018-08-08 DIAGNOSIS — M5416 Radiculopathy, lumbar region: Secondary | ICD-10-CM | POA: Diagnosis not present

## 2018-08-08 DIAGNOSIS — R262 Difficulty in walking, not elsewhere classified: Secondary | ICD-10-CM | POA: Diagnosis not present

## 2018-08-10 ENCOUNTER — Other Ambulatory Visit: Payer: Self-pay | Admitting: Internal Medicine

## 2018-08-10 DIAGNOSIS — Z1231 Encounter for screening mammogram for malignant neoplasm of breast: Secondary | ICD-10-CM

## 2018-08-11 DIAGNOSIS — R262 Difficulty in walking, not elsewhere classified: Secondary | ICD-10-CM | POA: Diagnosis not present

## 2018-08-11 DIAGNOSIS — M5416 Radiculopathy, lumbar region: Secondary | ICD-10-CM | POA: Diagnosis not present

## 2018-08-15 DIAGNOSIS — M5416 Radiculopathy, lumbar region: Secondary | ICD-10-CM | POA: Diagnosis not present

## 2018-08-15 DIAGNOSIS — R262 Difficulty in walking, not elsewhere classified: Secondary | ICD-10-CM | POA: Diagnosis not present

## 2018-08-18 DIAGNOSIS — R262 Difficulty in walking, not elsewhere classified: Secondary | ICD-10-CM | POA: Diagnosis not present

## 2018-08-18 DIAGNOSIS — M5416 Radiculopathy, lumbar region: Secondary | ICD-10-CM | POA: Diagnosis not present

## 2018-08-25 ENCOUNTER — Ambulatory Visit
Admission: RE | Admit: 2018-08-25 | Discharge: 2018-08-25 | Disposition: A | Discharge status: Home / Self Care | Payer: Medicare HMO | Source: Ambulatory Visit | Attending: Internal Medicine | Admitting: Internal Medicine

## 2018-08-25 ENCOUNTER — Other Ambulatory Visit: Payer: Self-pay

## 2018-08-25 DIAGNOSIS — Z1231 Encounter for screening mammogram for malignant neoplasm of breast: Secondary | ICD-10-CM

## 2018-08-25 DIAGNOSIS — M5416 Radiculopathy, lumbar region: Secondary | ICD-10-CM | POA: Diagnosis not present

## 2018-08-25 DIAGNOSIS — R262 Difficulty in walking, not elsewhere classified: Secondary | ICD-10-CM | POA: Diagnosis not present

## 2018-08-30 ENCOUNTER — Telehealth: Payer: Self-pay

## 2018-08-30 NOTE — Telephone Encounter (Signed)
Patient called saying her neighbor told her estradiol caused her to have breast cancer. She called to ask if she should stop taking this medication as she does have breast pain.  Talked to Dr. Army Melia and informed patient she recommended for patient to stop taking the medication.  The patient said she will.

## 2018-08-31 DIAGNOSIS — R262 Difficulty in walking, not elsewhere classified: Secondary | ICD-10-CM | POA: Diagnosis not present

## 2018-08-31 DIAGNOSIS — M5416 Radiculopathy, lumbar region: Secondary | ICD-10-CM | POA: Diagnosis not present

## 2018-09-07 DIAGNOSIS — M5416 Radiculopathy, lumbar region: Secondary | ICD-10-CM | POA: Diagnosis not present

## 2018-09-07 DIAGNOSIS — R262 Difficulty in walking, not elsewhere classified: Secondary | ICD-10-CM | POA: Diagnosis not present

## 2018-09-12 DIAGNOSIS — R262 Difficulty in walking, not elsewhere classified: Secondary | ICD-10-CM | POA: Diagnosis not present

## 2018-09-12 DIAGNOSIS — M5416 Radiculopathy, lumbar region: Secondary | ICD-10-CM | POA: Diagnosis not present

## 2018-11-23 DIAGNOSIS — R69 Illness, unspecified: Secondary | ICD-10-CM | POA: Diagnosis not present

## 2018-12-02 DIAGNOSIS — H2513 Age-related nuclear cataract, bilateral: Secondary | ICD-10-CM | POA: Diagnosis not present

## 2018-12-13 ENCOUNTER — Other Ambulatory Visit: Payer: Self-pay | Admitting: Gastroenterology

## 2018-12-14 NOTE — Telephone Encounter (Signed)
Patient called & needs refill   nadolol (CORGARD) 20 MG tablet   To [facility] in [place]. She has one pill left.

## 2018-12-19 DIAGNOSIS — K7460 Unspecified cirrhosis of liver: Secondary | ICD-10-CM | POA: Diagnosis not present

## 2018-12-19 DIAGNOSIS — H2511 Age-related nuclear cataract, right eye: Secondary | ICD-10-CM | POA: Diagnosis not present

## 2019-01-02 ENCOUNTER — Other Ambulatory Visit: Payer: Self-pay

## 2019-01-02 ENCOUNTER — Encounter: Payer: Self-pay | Admitting: *Deleted

## 2019-01-05 ENCOUNTER — Other Ambulatory Visit
Admission: RE | Admit: 2019-01-05 | Discharge: 2019-01-05 | Disposition: A | Discharge status: Home / Self Care | Payer: Medicare HMO | Source: Ambulatory Visit | Attending: Ophthalmology | Admitting: Ophthalmology

## 2019-01-05 DIAGNOSIS — Z20828 Contact with and (suspected) exposure to other viral communicable diseases: Secondary | ICD-10-CM | POA: Diagnosis not present

## 2019-01-05 DIAGNOSIS — Z01812 Encounter for preprocedural laboratory examination: Secondary | ICD-10-CM | POA: Diagnosis not present

## 2019-01-05 LAB — SARS CORONAVIRUS 2 (TAT 6-24 HRS): SARS Coronavirus 2: NEGATIVE

## 2019-01-05 NOTE — Discharge Instructions (Signed)

## 2019-01-09 ENCOUNTER — Ambulatory Visit
Admission: RE | Admit: 2019-01-09 | Discharge: 2019-01-09 | Disposition: A | Discharge status: Home / Self Care | Payer: Medicare HMO | Attending: Ophthalmology | Admitting: Ophthalmology

## 2019-01-09 ENCOUNTER — Ambulatory Visit: Payer: Medicare HMO | Admitting: Anesthesiology

## 2019-01-09 ENCOUNTER — Encounter
Admission: RE | Disposition: A | Discharge status: Home / Self Care | Payer: Self-pay | Source: Home / Self Care | Attending: Ophthalmology

## 2019-01-09 ENCOUNTER — Other Ambulatory Visit: Payer: Self-pay

## 2019-01-09 DIAGNOSIS — Z87442 Personal history of urinary calculi: Secondary | ICD-10-CM | POA: Diagnosis not present

## 2019-01-09 DIAGNOSIS — I8510 Secondary esophageal varices without bleeding: Secondary | ICD-10-CM | POA: Insufficient documentation

## 2019-01-09 DIAGNOSIS — F419 Anxiety disorder, unspecified: Secondary | ICD-10-CM | POA: Insufficient documentation

## 2019-01-09 DIAGNOSIS — J45909 Unspecified asthma, uncomplicated: Secondary | ICD-10-CM | POA: Insufficient documentation

## 2019-01-09 DIAGNOSIS — Z882 Allergy status to sulfonamides status: Secondary | ICD-10-CM | POA: Insufficient documentation

## 2019-01-09 DIAGNOSIS — K760 Fatty (change of) liver, not elsewhere classified: Secondary | ICD-10-CM | POA: Diagnosis not present

## 2019-01-09 DIAGNOSIS — Z9049 Acquired absence of other specified parts of digestive tract: Secondary | ICD-10-CM | POA: Insufficient documentation

## 2019-01-09 DIAGNOSIS — K7460 Unspecified cirrhosis of liver: Secondary | ICD-10-CM | POA: Insufficient documentation

## 2019-01-09 DIAGNOSIS — H25811 Combined forms of age-related cataract, right eye: Secondary | ICD-10-CM | POA: Diagnosis not present

## 2019-01-09 DIAGNOSIS — H2511 Age-related nuclear cataract, right eye: Secondary | ICD-10-CM | POA: Insufficient documentation

## 2019-01-09 DIAGNOSIS — Z79899 Other long term (current) drug therapy: Secondary | ICD-10-CM | POA: Insufficient documentation

## 2019-01-09 DIAGNOSIS — Z885 Allergy status to narcotic agent status: Secondary | ICD-10-CM | POA: Insufficient documentation

## 2019-01-09 DIAGNOSIS — R69 Illness, unspecified: Secondary | ICD-10-CM | POA: Diagnosis not present

## 2019-01-09 DIAGNOSIS — F329 Major depressive disorder, single episode, unspecified: Secondary | ICD-10-CM | POA: Insufficient documentation

## 2019-01-09 DIAGNOSIS — Z90710 Acquired absence of both cervix and uterus: Secondary | ICD-10-CM | POA: Insufficient documentation

## 2019-01-09 DIAGNOSIS — I10 Essential (primary) hypertension: Secondary | ICD-10-CM | POA: Diagnosis not present

## 2019-01-09 DIAGNOSIS — K219 Gastro-esophageal reflux disease without esophagitis: Secondary | ICD-10-CM | POA: Insufficient documentation

## 2019-01-09 HISTORY — PX: CATARACT EXTRACTION W/PHACO: SHX586

## 2019-01-09 HISTORY — DX: Esophageal varices without bleeding: I85.00

## 2019-01-09 SURGERY — PHACOEMULSIFICATION, CATARACT, WITH IOL INSERTION
Anesthesia: Monitor Anesthesia Care | Site: Eye | Laterality: Right

## 2019-01-09 MED ORDER — FENTANYL CITRATE (PF) 100 MCG/2ML IJ SOLN
INTRAMUSCULAR | Status: DC | PRN
  Administered 2019-01-09 (×2): 50 ug via INTRAVENOUS

## 2019-01-09 MED ORDER — SODIUM HYALURONATE 23 MG/ML IO SOLN
INTRAOCULAR | Status: DC | PRN
  Administered 2019-01-09: 0.6 mL via INTRAOCULAR

## 2019-01-09 MED ORDER — TETRACAINE HCL 0.5 % OP SOLN
1.0000 [drp] | OPHTHALMIC | Status: DC | PRN
  Administered 2019-01-09 (×3): 1 [drp] via OPHTHALMIC

## 2019-01-09 MED ORDER — LACTATED RINGERS IV SOLN: 100.0000 mL/h | INTRAVENOUS | Status: DC

## 2019-01-09 MED ORDER — MIDAZOLAM HCL 2 MG/2ML IJ SOLN
INTRAMUSCULAR | Status: DC | PRN
  Administered 2019-01-09 (×2): 1 mg via INTRAVENOUS

## 2019-01-09 MED ORDER — SODIUM HYALURONATE 10 MG/ML IO SOLN
INTRAOCULAR | Status: DC | PRN
  Administered 2019-01-09: 0.55 mL via INTRAOCULAR

## 2019-01-09 MED ORDER — EPINEPHRINE PF 1 MG/ML IJ SOLN
INTRAOCULAR | Status: DC | PRN
  Administered 2019-01-09: 77 mL via OPHTHALMIC

## 2019-01-09 MED ORDER — MOXIFLOXACIN HCL 0.5 % OP SOLN
OPHTHALMIC | Status: DC | PRN
  Administered 2019-01-09: 0.2 mL via OPHTHALMIC

## 2019-01-09 MED ORDER — ARMC OPHTHALMIC DILATING DROPS
1.0000 "application " | OPHTHALMIC | Status: DC | PRN
  Administered 2019-01-09 (×3): 1 via OPHTHALMIC

## 2019-01-09 MED ORDER — LIDOCAINE HCL (PF) 2 % IJ SOLN
INTRAOCULAR | Status: DC | PRN
  Administered 2019-01-09: 2 mL via INTRAOCULAR

## 2019-01-09 MED ORDER — ONDANSETRON HCL 4 MG/2ML IJ SOLN: 4.0000 mg | Freq: Once | INTRAMUSCULAR | Status: DC | PRN

## 2019-01-09 SURGICAL SUPPLY — 19 items
CANNULA ANT/CHMB 27G (MISCELLANEOUS) ×2 IMPLANT
CANNULA ANT/CHMB 27GA (MISCELLANEOUS) ×4 IMPLANT
DISSECTOR HYDRO NUCLEUS 50X22 (MISCELLANEOUS) ×2 IMPLANT
GLOVE SURG LX 7.5 STRW (GLOVE) ×1
GLOVE SURG LX STRL 7.5 STRW (GLOVE) ×1 IMPLANT
GLOVE SURG SYN 8.5  E (GLOVE) ×1
GLOVE SURG SYN 8.5 E (GLOVE) ×1 IMPLANT
GLOVE SURG SYN 8.5 PF PI (GLOVE) ×1 IMPLANT
GOWN STRL REUS W/ TWL LRG LVL3 (GOWN DISPOSABLE) ×2 IMPLANT
GOWN STRL REUS W/TWL LRG LVL3 (GOWN DISPOSABLE) ×2
LENS IOL TECNIS ITEC 21.5 (Intraocular Lens) ×1 IMPLANT
MARKER SKIN DUAL TIP RULER LAB (MISCELLANEOUS) ×2 IMPLANT
PACK DR. KING ARMS (PACKS) ×2 IMPLANT
PACK EYE AFTER SURG (MISCELLANEOUS) ×2 IMPLANT
PACK OPTHALMIC (MISCELLANEOUS) ×2 IMPLANT
SYR 3ML LL SCALE MARK (SYRINGE) ×2 IMPLANT
SYR TB 1ML LUER SLIP (SYRINGE) ×2 IMPLANT
WATER STERILE IRR 250ML POUR (IV SOLUTION) ×2 IMPLANT
WIPE NON LINTING 3.25X3.25 (MISCELLANEOUS) ×2 IMPLANT

## 2019-01-09 NOTE — Anesthesia Preprocedure Evaluation (Addendum)
Anesthesia Evaluation  Patient identified by MRN, date of birth, ID band Patient awake    Reviewed: Allergy & Precautions, NPO status , Patient's Chart, lab work & pertinent test results, reviewed documented beta blocker date and time   Airway Mallampati: II  TM Distance: >3 FB Neck ROM: Full    Dental no notable dental hx.    Pulmonary asthma ,    Pulmonary exam normal breath sounds clear to auscultation       Cardiovascular hypertension, Normal cardiovascular exam Rhythm:Regular Rate:Normal     Neuro/Psych PSYCHIATRIC DISORDERS Anxiety negative neurological ROS     GI/Hepatic Neg liver ROS, GERD  Controlled,  Endo/Other  negative endocrine ROS  Renal/GU negative Renal ROS  negative genitourinary   Musculoskeletal negative musculoskeletal ROS (+)   Abdominal Normal abdominal exam  (+)   Peds  Hematology negative hematology ROS (+)   Anesthesia Other Findings   Reproductive/Obstetrics negative OB ROS                             Anesthesia Physical Anesthesia Plan  ASA: II  Anesthesia Plan: MAC   Post-op Pain Management:    Induction:   PONV Risk Score and Plan: 2 and Ondansetron  Airway Management Planned: Nasal Cannula  Additional Equipment:   Intra-op Plan:   Post-operative Plan:   Informed Consent: I have reviewed the patients History and Physical, chart, labs and discussed the procedure including the risks, benefits and alternatives for the proposed anesthesia with the patient or authorized representative who has indicated his/her understanding and acceptance.     Dental advisory given  Plan Discussed with: CRNA  Anesthesia Plan Comments:         Anesthesia Quick Evaluation

## 2019-01-09 NOTE — Anesthesia Postprocedure Evaluation (Signed)
Anesthesia Post Note  Patient: Jenna Franklin  Procedure(s) Performed: CATARACT EXTRACTION PHACO AND INTRAOCULAR LENS PLACEMENT (IOC) RIGHT 8.60,        00:53.3 (Right Eye)     Anesthesia Post Evaluation  Ardeth Sportsman

## 2019-01-09 NOTE — Anesthesia Procedure Notes (Signed)
Procedure Name: MAC Performed by: Izetta Dakin, CRNA Pre-anesthesia Checklist: Timeout performed, Patient being monitored, Suction available, Emergency Drugs available and Patient identified Patient Re-evaluated:Patient Re-evaluated prior to induction Oxygen Delivery Method: Nasal cannula

## 2019-01-09 NOTE — Transfer of Care (Addendum)
Immediate Anesthesia Transfer of Care Note  Patient: Jenna Franklin  Procedure(s) Performed: CATARACT EXTRACTION PHACO AND INTRAOCULAR LENS PLACEMENT (IOC) RIGHT 8.60,        00:53.3 (Right Eye)  Patient Location: PACU  Anesthesia Type: MAC  Level of Consciousness: awake, alert  and patient cooperative  Airway and Oxygen Therapy: Patient Spontanous Breathing and Patient connected to supplemental oxygen  Post-op Assessment: Post-op Vital signs reviewed, Patient's Cardiovascular Status Stable, Respiratory Function Stable, Patent Airway and No signs of Nausea or vomiting  Post-op Vital Signs: Reviewed and stable  Complications: No apparent anesthesia complications

## 2019-01-09 NOTE — H&P (Signed)

## 2019-01-09 NOTE — Op Note (Signed)
OPERATIVE NOTE  Jenna Franklin [id number] 01/09/2019   PREOPERATIVE DIAGNOSIS:  Nuclear sclerotic cataract right eye.  H25.11   POSTOPERATIVE DIAGNOSIS:    Nuclear sclerotic cataract right eye.     PROCEDURE:  Phacoemusification with posterior chamber intraocular lens placement of the right eye   LENS:   Implant Name Type Inv. Item Serial No. Manufacturer Lot No. LRB No. Used Action  LENS IOL DIOP 21.5 - [id number] Intraocular Lens LENS IOL DIOP 21.5 [id number]  Right 1 Implanted       Procedure(s): CATARACT EXTRACTION PHACO AND INTRAOCULAR LENS PLACEMENT (IOC) RIGHT 8.60,        00:53.3 (Right)  PCB00 +21.5   ULTRASOUND TIME: 0 minutes 53 seconds.  CDE 8.60   SURGEON:  Benay Pillow, MD, MPH  ANESTHESIOLOGIST: Anesthesiologist: Ardeth Sportsman, MD   ANESTHESIA:  Topical with tetracaine drops augmented with 1% preservative-free intracameral lidocaine.  ESTIMATED BLOOD LOSS: less than 1 mL.   COMPLICATIONS:  None.   DESCRIPTION OF PROCEDURE:  The patient was identified in the holding room and transported to the operating room and placed in the supine position under the operating microscope.  The right eye was identified as the operative eye and it was prepped and draped in the usual sterile ophthalmic fashion.   A 1.0 millimeter clear-corneal paracentesis was made at the 10:30 position. 0.5 ml of preservative-free 1% lidocaine with epinephrine was injected into the anterior chamber.  The anterior chamber was filled with Healon 5 viscoelastic.  A 2.4 millimeter keratome was used to make a near-clear corneal incision at the 8:00 position.  A curvilinear capsulorrhexis was made with a cystotome and capsulorrhexis forceps.  Balanced salt solution was used to hydrodissect and hydrodelineate the nucleus.   Phacoemulsification was then used in stop and chop fashion to remove the lens nucleus and epinucleus.  The remaining cortex was then removed using the irrigation and aspiration  handpiece. Healon was then placed into the capsular bag to distend it for lens placement.  A lens was then injected into the capsular bag.  The remaining viscoelastic was aspirated.   Wounds were hydrated with balanced salt solution.  The anterior chamber was inflated to a physiologic pressure with balanced salt solution.   Intracameral vigamox 0.1 mL undiluted was injected into the eye and a drop placed onto the ocular surface.  No wound leaks were noted.  The patient was taken to the recovery room in stable condition without complications of anesthesia or surgery  Benay Pillow 01/09/2019, 8:33 AM

## 2019-01-10 ENCOUNTER — Encounter: Payer: Self-pay | Admitting: Ophthalmology

## 2019-01-18 ENCOUNTER — Encounter: Payer: Self-pay | Admitting: Internal Medicine

## 2019-01-18 ENCOUNTER — Other Ambulatory Visit: Payer: Self-pay

## 2019-01-18 ENCOUNTER — Ambulatory Visit (INDEPENDENT_AMBULATORY_CARE_PROVIDER_SITE_OTHER): Payer: Medicare HMO | Admitting: Internal Medicine

## 2019-01-18 VITALS — BP 132/80 | HR 94 | Ht 62.5 in | Wt 162.0 lb

## 2019-01-18 DIAGNOSIS — R310 Gross hematuria: Secondary | ICD-10-CM | POA: Diagnosis not present

## 2019-01-18 DIAGNOSIS — K7460 Unspecified cirrhosis of liver: Secondary | ICD-10-CM

## 2019-01-18 DIAGNOSIS — R300 Dysuria: Secondary | ICD-10-CM | POA: Diagnosis not present

## 2019-01-18 DIAGNOSIS — R188 Other ascites: Secondary | ICD-10-CM

## 2019-01-18 LAB — POC URINALYSIS WITH MICROSCOPIC (NON AUTO)MANUAL RESULT
Bacteria, UA: 0
Bilirubin, UA: NEGATIVE
Crystals: 0
Glucose, UA: NEGATIVE
Ketones, UA: NEGATIVE
Mucus, UA: 0
Nitrite, UA: NEGATIVE
Protein, UA: POSITIVE — AB
RBC: 50 M/uL — AB (ref 4.04–5.48)
Spec Grav, UA: 1.01 (ref 1.010–1.025)
Urobilinogen, UA: 0.2 E.U./dL
WBC Casts, UA: 0
pH, UA: 5 (ref 5.0–8.0)

## 2019-01-18 MED ORDER — CIPROFLOXACIN HCL 250 MG PO TABS: 250.0000 mg | ORAL_TABLET | Freq: Two times a day (BID) | ORAL | 0 refills | Status: DC

## 2019-01-18 NOTE — Progress Notes (Signed)
Date:  01/18/2019   Name:  Jenna Franklin   DOB:  March 16, 1940   MRN:  [id number]   Chief Complaint: Back Pain (Bloody urine and pain started last night in her side at 10:30PM. Pain was severe at a 10 all night. Pushed fluids last night. Last few days her urine had been dark but not bloody. )  Back Pain This is a new problem. The current episode started yesterday. The problem occurs constantly. The problem has been rapidly improving since onset. Pain location: lower back. The pain is mild. Associated symptoms include abdominal pain and dysuria. Pertinent negatives include no chest pain or fever. (And Hematuria.  She thinks she may have passed a stone early this am. ) Risk factors: hx of kidney stones. Treatments tried: increased fluids.    Lab Results  Component Value Date   CREATININE 0.81 07/27/2018   BUN 14 07/27/2018   NA 136 07/27/2018   K 3.8 07/27/2018   CL 102 07/27/2018   CO2 25 07/27/2018   Lab Results  Component Value Date   CHOL 129 11/19/2017   HDL 45 11/19/2017   LDLCALC 61 11/19/2017   TRIG 115 11/19/2017   CHOLHDL 2.9 11/19/2017   Lab Results  Component Value Date   TSH 4.009 11/19/2017   Lab Results  Component Value Date   HGBA1C 6.0 (H) 07/27/2018     Review of Systems  Constitutional: Positive for chills. Negative for diaphoresis, fatigue, fever and unexpected weight change.  Respiratory: Negative for chest tightness and shortness of breath.   Cardiovascular: Negative for chest pain.  Gastrointestinal: Positive for abdominal pain and nausea. Negative for constipation, diarrhea and vomiting.  Genitourinary: Positive for dysuria and hematuria.  Musculoskeletal: Positive for back pain.    Patient Active Problem List   Diagnosis Date Noted  . Lumbar spinal stenosis 07/27/2018  . Thoracic compression fracture, sequela 04/01/2018  . Hx of sinus tachycardia 07/06/2017  . Gastritis without bleeding   . Tendinopathy of right gluteus medius  01/01/2017  . Neck muscle spasm 11/18/2016  . Esophageal varices in cirrhosis (HCC)   . Cirrhosis of liver with ascites ([place]) 05/29/2015  . Post-menopausal atrophic vaginitis 05/29/2015  . Acid reflux 03/25/2015  . Calculus of kidney 03/25/2015  . Pre-diabetes 09/20/2014  . Adjustment disorder with anxious mood 10/05/2013  . Bursitis, trochanteric 12/11/2011    Allergies  Allergen Reactions  . Baclofen Anxiety  . Codeine Nausea And Vomiting  . Prednisone Anxiety    Past Surgical History:  Procedure Laterality Date  . ABDOMINAL HYSTERECTOMY    . APPENDECTOMY    . BREAST BIOPSY Right    neg needle bx  . CATARACT EXTRACTION W/PHACO Right 01/09/2019   Procedure: CATARACT EXTRACTION PHACO AND INTRAOCULAR LENS PLACEMENT (IOC) RIGHT 8.60,        00:53.3;  Surgeon: Eulogio Bear, MD;  Location: [facility];  Service: Ophthalmology;  Laterality: Right;  . COMBINED HYSTERECTOMY ABDOMINAL W/ A&P REPAIR / OOPHORECTOMY    . ESOPHAGOGASTRODUODENOSCOPY (EGD) WITH PROPOFOL N/A 06/17/2015   Procedure: ESOPHAGOGASTRODUODENOSCOPY (EGD) WITH PROPOFOL;  Surgeon: Lucilla Lame, MD;  Location: [facility];  Service: Endoscopy;  Laterality: N/A;  . ESOPHAGOGASTRODUODENOSCOPY (EGD) WITH PROPOFOL N/A 06/17/2017   Procedure: ESOPHAGOGASTRODUODENOSCOPY (EGD) WITH PROPOFOL;  Surgeon: Lucilla Lame, MD;  Location: [facility];  Service: Endoscopy;  Laterality: N/A;  . LAPAROSCOPIC CHOLECYSTECTOMY    . TONSILLECTOMY      Social History   Tobacco Use  . Smoking  status: Never Smoker  . Smokeless tobacco: Never Used  . Tobacco comment: smoking cessation materials not required  Substance Use Topics  . Alcohol use: No    Alcohol/week: 0.0 standard drinks  . Drug use: No     Medication list has been reviewed and updated.  Current Meds  Medication Sig  . acetaminophen (TYLENOL) 500 MG tablet Take 500 mg by mouth every 6 (six) hours as needed.  . fluticasone (FLONASE) 50 MCG/ACT  nasal spray Place 2 sprays into both nostrils daily.  . furosemide (LASIX) 40 MG tablet TAKE ONE (1) TABLET BY MOUTH ONCE DAILY  . nadolol (CORGARD) 20 MG tablet Take 1 tablet (20 mg total) by mouth daily. **PLEASE SCHEDULE FOLLOW UP APPT** (Patient taking differently: Take 20 mg by mouth daily. )  . spironolactone (ALDACTONE) 50 MG tablet TAKE ONE (1) TABLET BY MOUTH ONCE DAILY  . vitamin E 400 UNIT capsule Take 400 Units by mouth daily.    PHQ 2/9 Scores 01/18/2019 07/27/2018 07/08/2018 04/01/2018  PHQ - 2 Score 0 0 0 1  PHQ- 9 Score - 3 0 -    BP Readings from Last 3 Encounters:  01/18/19 132/80  01/09/19 115/60  07/27/18 126/70    Physical Exam Constitutional:      Appearance: Normal appearance. She is not ill-appearing.  Cardiovascular:     Rate and Rhythm: Normal rate and regular rhythm.  Pulmonary:     Effort: Pulmonary effort is normal.     Breath sounds: Normal breath sounds. No wheezing or rhonchi.  Abdominal:     Tenderness: There is abdominal tenderness in the suprapubic area. There is no right CVA tenderness or left CVA tenderness.  Neurological:     Mental Status: She is alert.     Wt Readings from Last 3 Encounters:  01/18/19 162 lb (73.5 kg)  01/09/19 162 lb (73.5 kg)  07/27/18 163 lb (73.9 kg)    BP 132/80   Pulse 94   Ht 5' 2.5" (1.588 m)   Wt 162 lb (73.5 kg)   SpO2 95%   BMI 29.16 kg/m   Assessment and Plan: 1. Gross hematuria Suspect that she passed a stone earlier today since the discomfort is much less Continue to pus fluids, monitor sx and call back tomorrow to report Go to ED if suddenly worse or unable to void - POC urinalysis w microscopic (non auto)  2. Dysuria Will give 3 day antibiotic course for possible mild infection/urethritis - ciprofloxacin (CIPRO) 250 MG tablet; Take 1 tablet (250 mg total) by mouth 2 (two) times daily for 3 days.  Dispense: 6 tablet; Refill: 0  3. Cirrhosis of liver with ascites, unspecified hepatic cirrhosis  type ([place]) Stable, followed by GI   Partially dictated using Editor, commissioning. Any errors are unintentional.  Halina Maidens, MD [facility]  01/18/2019

## 2019-01-18 NOTE — Patient Instructions (Signed)
Call tomorrow to report status if back pain and urine appearance.

## 2019-01-19 ENCOUNTER — Other Ambulatory Visit: Payer: Self-pay

## 2019-01-20 DIAGNOSIS — K7460 Unspecified cirrhosis of liver: Secondary | ICD-10-CM | POA: Diagnosis not present

## 2019-01-20 DIAGNOSIS — H2512 Age-related nuclear cataract, left eye: Secondary | ICD-10-CM | POA: Diagnosis not present

## 2019-01-20 NOTE — Anesthesia Preprocedure Evaluation (Addendum)
Anesthesia Evaluation  Patient identified by MRN, date of birth, ID band Patient awake    Reviewed: Allergy & Precautions, NPO status , Patient's Chart, lab work & pertinent test results, reviewed documented beta blocker date and time   History of Anesthesia Complications Negative for: history of anesthetic complications  Airway Mallampati: II  TM Distance: >3 FB Neck ROM: Full    Dental no notable dental hx.    Pulmonary asthma ,    Pulmonary exam normal breath sounds clear to auscultation       Cardiovascular hypertension, (-) angina(-) DOE Normal cardiovascular exam Rhythm:Regular Rate:Normal     Neuro/Psych PSYCHIATRIC DISORDERS Anxiety Depression  Neuromuscular disease (H/o spinal stenosis)    GI/Hepatic GERD  Controlled,(+) Cirrhosis       ,  NAFLD  H/o esophageal varices   Endo/Other    Renal/GU Renal disease (Stones)     Musculoskeletal  (+) Arthritis ,   Abdominal Normal abdominal exam  (+)   Peds  Hematology   Anesthesia Other Findings   Reproductive/Obstetrics                            Anesthesia Physical  Anesthesia Plan  ASA: II  Anesthesia Plan: MAC   Post-op Pain Management:    Induction: Intravenous  PONV Risk Score and Plan: 2 and Ondansetron  Airway Management Planned: Nasal Cannula  Additional Equipment:   Intra-op Plan:   Post-operative Plan:   Informed Consent: I have reviewed the patients History and Physical, chart, labs and discussed the procedure including the risks, benefits and alternatives for the proposed anesthesia with the patient or authorized representative who has indicated his/her understanding and acceptance.     Dental advisory given  Plan Discussed with: CRNA  Anesthesia Plan Comments:         Anesthesia Quick Evaluation

## 2019-01-26 ENCOUNTER — Other Ambulatory Visit
Admission: RE | Admit: 2019-01-26 | Discharge: 2019-01-26 | Disposition: A | Discharge status: Home / Self Care | Payer: Medicare HMO | Source: Ambulatory Visit | Attending: Ophthalmology | Admitting: Ophthalmology

## 2019-01-26 ENCOUNTER — Other Ambulatory Visit: Payer: Self-pay

## 2019-01-26 DIAGNOSIS — Z01812 Encounter for preprocedural laboratory examination: Secondary | ICD-10-CM | POA: Insufficient documentation

## 2019-01-26 DIAGNOSIS — Z20828 Contact with and (suspected) exposure to other viral communicable diseases: Secondary | ICD-10-CM | POA: Diagnosis not present

## 2019-01-26 LAB — SARS CORONAVIRUS 2 (TAT 6-24 HRS): SARS Coronavirus 2: NEGATIVE

## 2019-01-26 NOTE — Discharge Instructions (Signed)

## 2019-01-27 ENCOUNTER — Ambulatory Visit: Payer: Medicare HMO | Admitting: Internal Medicine

## 2019-01-30 ENCOUNTER — Ambulatory Visit: Payer: Medicare HMO | Admitting: Internal Medicine

## 2019-01-30 ENCOUNTER — Other Ambulatory Visit: Payer: Self-pay

## 2019-01-30 ENCOUNTER — Ambulatory Visit: Payer: Medicare HMO | Admitting: Anesthesiology

## 2019-01-30 ENCOUNTER — Encounter
Admission: RE | Disposition: A | Discharge status: Home / Self Care | Payer: Self-pay | Source: Home / Self Care | Attending: Ophthalmology

## 2019-01-30 ENCOUNTER — Ambulatory Visit
Admission: RE | Admit: 2019-01-30 | Discharge: 2019-01-30 | Disposition: A | Discharge status: Home / Self Care | Payer: Medicare HMO | Attending: Ophthalmology | Admitting: Ophthalmology

## 2019-01-30 DIAGNOSIS — J45909 Unspecified asthma, uncomplicated: Secondary | ICD-10-CM | POA: Diagnosis not present

## 2019-01-30 DIAGNOSIS — M1990 Unspecified osteoarthritis, unspecified site: Secondary | ICD-10-CM | POA: Diagnosis not present

## 2019-01-30 DIAGNOSIS — Z888 Allergy status to other drugs, medicaments and biological substances status: Secondary | ICD-10-CM | POA: Insufficient documentation

## 2019-01-30 DIAGNOSIS — Z882 Allergy status to sulfonamides status: Secondary | ICD-10-CM | POA: Diagnosis not present

## 2019-01-30 DIAGNOSIS — H2512 Age-related nuclear cataract, left eye: Secondary | ICD-10-CM | POA: Diagnosis not present

## 2019-01-30 DIAGNOSIS — Z885 Allergy status to narcotic agent status: Secondary | ICD-10-CM | POA: Insufficient documentation

## 2019-01-30 DIAGNOSIS — R7303 Prediabetes: Secondary | ICD-10-CM | POA: Insufficient documentation

## 2019-01-30 DIAGNOSIS — Z79899 Other long term (current) drug therapy: Secondary | ICD-10-CM | POA: Insufficient documentation

## 2019-01-30 DIAGNOSIS — R69 Illness, unspecified: Secondary | ICD-10-CM | POA: Diagnosis not present

## 2019-01-30 DIAGNOSIS — I110 Hypertensive heart disease with heart failure: Secondary | ICD-10-CM | POA: Insufficient documentation

## 2019-01-30 DIAGNOSIS — I509 Heart failure, unspecified: Secondary | ICD-10-CM | POA: Diagnosis not present

## 2019-01-30 DIAGNOSIS — K760 Fatty (change of) liver, not elsewhere classified: Secondary | ICD-10-CM | POA: Insufficient documentation

## 2019-01-30 DIAGNOSIS — K7460 Unspecified cirrhosis of liver: Secondary | ICD-10-CM | POA: Diagnosis not present

## 2019-01-30 DIAGNOSIS — H25812 Combined forms of age-related cataract, left eye: Secondary | ICD-10-CM | POA: Diagnosis not present

## 2019-01-30 DIAGNOSIS — F418 Other specified anxiety disorders: Secondary | ICD-10-CM | POA: Diagnosis not present

## 2019-01-30 HISTORY — PX: CATARACT EXTRACTION W/PHACO: SHX586

## 2019-01-30 HISTORY — DX: Anxiety disorder, unspecified: F41.9

## 2019-01-30 HISTORY — DX: Spinal stenosis, site unspecified: M48.00

## 2019-01-30 HISTORY — DX: Personal history of urinary calculi: Z87.442

## 2019-01-30 HISTORY — DX: Dizziness and giddiness: R42

## 2019-01-30 HISTORY — DX: Depression, unspecified: F32.A

## 2019-01-30 HISTORY — DX: Unspecified osteoarthritis, unspecified site: M19.90

## 2019-01-30 SURGERY — PHACOEMULSIFICATION, CATARACT, WITH IOL INSERTION
Anesthesia: Monitor Anesthesia Care | Laterality: Left

## 2019-01-30 MED ORDER — MOXIFLOXACIN HCL 0.5 % OP SOLN
OPHTHALMIC | Status: DC | PRN
  Administered 2019-01-30: 0.2 mL via OPHTHALMIC

## 2019-01-30 MED ORDER — ONDANSETRON HCL 4 MG/2ML IJ SOLN: 4.0000 mg | Freq: Once | INTRAMUSCULAR | Status: DC | PRN

## 2019-01-30 MED ORDER — ACETAMINOPHEN 10 MG/ML IV SOLN: 1000.0000 mg | Freq: Once | INTRAVENOUS | Status: DC | PRN

## 2019-01-30 MED ORDER — EPINEPHRINE PF 1 MG/ML IJ SOLN
INTRAOCULAR | Status: DC | PRN
  Administered 2019-01-30: 08:00:00 88 mL via OPHTHALMIC

## 2019-01-30 MED ORDER — LIDOCAINE HCL (PF) 2 % IJ SOLN
INTRAOCULAR | Status: DC | PRN
  Administered 2019-01-30: 08:00:00 2 mL via INTRAOCULAR

## 2019-01-30 MED ORDER — TETRACAINE HCL 0.5 % OP SOLN
1.0000 [drp] | OPHTHALMIC | Status: DC | PRN
  Administered 2019-01-30 (×3): 1 [drp] via OPHTHALMIC

## 2019-01-30 MED ORDER — SODIUM HYALURONATE 23 MG/ML IO SOLN
INTRAOCULAR | Status: DC | PRN
  Administered 2019-01-30: 0.6 mL via INTRAOCULAR

## 2019-01-30 MED ORDER — ARMC OPHTHALMIC DILATING DROPS
1.0000 "application " | OPHTHALMIC | Status: DC | PRN
  Administered 2019-01-30 (×3): 1 via OPHTHALMIC

## 2019-01-30 MED ORDER — MIDAZOLAM HCL 2 MG/2ML IJ SOLN
INTRAMUSCULAR | Status: DC | PRN
  Administered 2019-01-30 (×2): 1 mg via INTRAVENOUS

## 2019-01-30 MED ORDER — FENTANYL CITRATE (PF) 100 MCG/2ML IJ SOLN
INTRAMUSCULAR | Status: DC | PRN
  Administered 2019-01-30: 50 ug via INTRAVENOUS

## 2019-01-30 MED ORDER — SODIUM HYALURONATE 10 MG/ML IO SOLN
INTRAOCULAR | Status: DC | PRN
  Administered 2019-01-30: 0.55 mL via INTRAOCULAR

## 2019-01-30 MED ORDER — LACTATED RINGERS IV SOLN: 100.0000 mL/h | INTRAVENOUS | Status: DC

## 2019-01-30 SURGICAL SUPPLY — 19 items
CANNULA ANT/CHMB 27G (MISCELLANEOUS) ×2 IMPLANT
CANNULA ANT/CHMB 27GA (MISCELLANEOUS) ×4 IMPLANT
DISSECTOR HYDRO NUCLEUS 50X22 (MISCELLANEOUS) ×2 IMPLANT
GLOVE SURG LX 7.5 STRW (GLOVE) ×1
GLOVE SURG LX STRL 7.5 STRW (GLOVE) ×1 IMPLANT
GLOVE SURG SYN 8.5  E (GLOVE) ×1
GLOVE SURG SYN 8.5 E (GLOVE) ×1 IMPLANT
GLOVE SURG SYN 8.5 PF PI (GLOVE) ×1 IMPLANT
GOWN STRL REUS W/ TWL LRG LVL3 (GOWN DISPOSABLE) ×2 IMPLANT
GOWN STRL REUS W/TWL LRG LVL3 (GOWN DISPOSABLE) ×2
LENS IOL TECNIS ITEC 20.5 (Intraocular Lens) ×1 IMPLANT
MARKER SKIN DUAL TIP RULER LAB (MISCELLANEOUS) ×2 IMPLANT
PACK DR. KING ARMS (PACKS) ×2 IMPLANT
PACK EYE AFTER SURG (MISCELLANEOUS) ×2 IMPLANT
PACK OPTHALMIC (MISCELLANEOUS) ×2 IMPLANT
SYR 3ML LL SCALE MARK (SYRINGE) ×2 IMPLANT
SYR TB 1ML LUER SLIP (SYRINGE) ×2 IMPLANT
WATER STERILE IRR 250ML POUR (IV SOLUTION) ×2 IMPLANT
WIPE NON LINTING 3.25X3.25 (MISCELLANEOUS) ×2 IMPLANT

## 2019-01-30 NOTE — Anesthesia Procedure Notes (Signed)
Procedure Name: MAC Performed by: Vanetta Shawl, CRNA Pre-anesthesia Checklist: Patient identified, Emergency Drugs available, Suction available, Timeout performed and Patient being monitored Patient Re-evaluated:Patient Re-evaluated prior to induction Oxygen Delivery Method: Nasal cannula Placement Confirmation: positive ETCO2

## 2019-01-30 NOTE — Op Note (Signed)
OPERATIVE NOTE  [address] [id number] 01/30/2019   PREOPERATIVE DIAGNOSIS:  Nuclear sclerotic cataract left eye.  H25.12   POSTOPERATIVE DIAGNOSIS:    Nuclear sclerotic cataract left eye.     PROCEDURE:  Phacoemusification with posterior chamber intraocular lens placement of the left eye   LENS:   Implant Name Type Inv. Item Serial No. Manufacturer Lot No. LRB No. Used Action  LENS IOL DIOP 20.5 - [id number] Intraocular Lens LENS IOL DIOP 20.5 [id number]  Left 1 Implanted      Procedure(s): CATARACT EXTRACTION PHACO AND INTRAOCULAR LENS PLACEMENT (IOC) LEFT, 2.59, 00:25.5 (Left)  PCB00 +20.5   ULTRASOUND TIME: 0 minutes 25 seconds.  CDE 2.59   SURGEON:  Benay Pillow, MD, MPH   ANESTHESIA:  Topical with tetracaine drops augmented with 1% preservative-free intracameral lidocaine.  ESTIMATED BLOOD LOSS: <1 mL   COMPLICATIONS:  None.   DESCRIPTION OF PROCEDURE:  The patient was identified in the holding room and transported to the operating room and placed in the supine position under the operating microscope.  The left eye was identified as the operative eye and it was prepped and draped in the usual sterile ophthalmic fashion.   A 1.0 millimeter clear-corneal paracentesis was made at the 5:00 position. 0.5 ml of preservative-free 1% lidocaine with epinephrine was injected into the anterior chamber.  The anterior chamber was filled with Healon 5 viscoelastic.  A 2.4 millimeter keratome was used to make a near-clear corneal incision at the 2:00 position.  A curvilinear capsulorrhexis was made with a cystotome and capsulorrhexis forceps.  Balanced salt solution was used to hydrodissect and hydrodelineate the nucleus.   Phacoemulsification was then used in stop and chop fashion to remove the lens nucleus and epinucleus.  The remaining cortex was then removed using the irrigation and aspiration handpiece. Healon was then placed into the capsular bag to distend it for lens  placement.  A lens was then injected into the capsular bag.  The remaining viscoelastic was aspirated.   Wounds were hydrated with balanced salt solution.  The anterior chamber was inflated to a physiologic pressure with balanced salt solution.  Intracameral vigamox 0.1 mL undiltued was injected into the eye and a drop placed onto the ocular surface.  No wound leaks were noted.  The patient was taken to the recovery room in stable condition without complications of anesthesia or surgery  Benay Pillow 01/30/2019, 8:18 AM

## 2019-01-30 NOTE — Transfer of Care (Signed)
Immediate Anesthesia Transfer of Care Note  Patient: Jenna Franklin  Procedure(s) Performed: CATARACT EXTRACTION PHACO AND INTRAOCULAR LENS PLACEMENT (IOC) LEFT, 2.59, 00:25.5 (Left )  Patient Location: PACU  Anesthesia Type: MAC  Level of Consciousness: awake, alert  and patient cooperative  Airway and Oxygen Therapy: Patient Spontanous Breathing and Patient connected to supplemental oxygen  Post-op Assessment: Post-op Vital signs reviewed, Patient's Cardiovascular Status Stable, Respiratory Function Stable, Patent Airway and No signs of Nausea or vomiting  Post-op Vital Signs: Reviewed and stable  Complications: No apparent anesthesia complications

## 2019-01-30 NOTE — H&P (Signed)

## 2019-01-30 NOTE — Anesthesia Postprocedure Evaluation (Signed)
Anesthesia Post Note  Patient: Jenna Franklin  Procedure(s) Performed: CATARACT EXTRACTION PHACO AND INTRAOCULAR LENS PLACEMENT (IOC) LEFT, 2.59, 00:25.5 (Left )     Patient location during evaluation: PACU Anesthesia Type: MAC Level of consciousness: awake and alert Pain management: pain level controlled Vital Signs Assessment: post-procedure vital signs reviewed and stable Respiratory status: spontaneous breathing, nonlabored ventilation, respiratory function stable and patient connected to nasal cannula oxygen Cardiovascular status: stable and blood pressure returned to baseline Postop Assessment: no apparent nausea or vomiting Anesthetic complications: no    Leyani Gargus A  Tijah Hane

## 2019-01-31 ENCOUNTER — Encounter: Payer: Self-pay | Admitting: *Deleted

## 2019-03-07 ENCOUNTER — Telehealth: Payer: Self-pay | Admitting: Internal Medicine

## 2019-03-07 NOTE — Telephone Encounter (Signed)
Symptoms of diarrhea and ? UTI. She would like an antibiotic.

## 2019-03-07 NOTE — Telephone Encounter (Signed)
Pt needs to be seen before she can be prescribed any medication as long as she has no fever or cough. Please call and schedule her an appt for tomorrow at 340 PM. Tell her to come with a full bladder so we can check her urine.

## 2019-03-08 ENCOUNTER — Ambulatory Visit: Payer: Medicare HMO | Admitting: Internal Medicine

## 2019-03-09 ENCOUNTER — Ambulatory Visit (INDEPENDENT_AMBULATORY_CARE_PROVIDER_SITE_OTHER): Payer: Medicare HMO | Admitting: Internal Medicine

## 2019-03-09 ENCOUNTER — Other Ambulatory Visit
Admission: RE | Admit: 2019-03-09 | Discharge: 2019-03-09 | Disposition: A | Discharge status: Home / Self Care | Payer: Medicare HMO | Attending: Internal Medicine | Admitting: Internal Medicine

## 2019-03-09 ENCOUNTER — Telehealth: Payer: Self-pay | Admitting: Gastroenterology

## 2019-03-09 ENCOUNTER — Other Ambulatory Visit: Payer: Self-pay

## 2019-03-09 ENCOUNTER — Encounter: Payer: Self-pay | Admitting: Internal Medicine

## 2019-03-09 VITALS — BP 112/68 | HR 70 | Temp 97.9°F | Ht 62.5 in | Wt 162.0 lb

## 2019-03-09 DIAGNOSIS — K7460 Unspecified cirrhosis of liver: Secondary | ICD-10-CM

## 2019-03-09 DIAGNOSIS — R300 Dysuria: Secondary | ICD-10-CM | POA: Insufficient documentation

## 2019-03-09 DIAGNOSIS — R188 Other ascites: Secondary | ICD-10-CM | POA: Diagnosis not present

## 2019-03-09 DIAGNOSIS — R7303 Prediabetes: Secondary | ICD-10-CM | POA: Diagnosis not present

## 2019-03-09 LAB — COMPREHENSIVE METABOLIC PANEL
ALT: 42 U/L (ref 0–44)
AST: 43 U/L — ABNORMAL HIGH (ref 15–41)
Albumin: 4.3 g/dL (ref 3.5–5.0)
Alkaline Phosphatase: 72 U/L (ref 38–126)
Anion gap: 10 (ref 5–15)
BUN: 17 mg/dL (ref 8–23)
CO2: 21 mmol/L — ABNORMAL LOW (ref 22–32)
Calcium: 9.5 mg/dL (ref 8.9–10.3)
Chloride: 102 mmol/L (ref 98–111)
Creatinine, Ser: 0.9 mg/dL (ref 0.44–1.00)
GFR calc Af Amer: >60 mL/min (ref >60)
GFR calc non Af Amer: >60 mL/min (ref >60)
Glucose, Bld: 112 mg/dL — ABNORMAL HIGH (ref 70–99)
Potassium: 3.7 mmol/L (ref 3.5–5.1)
Sodium: 133 mmol/L — ABNORMAL LOW (ref 135–145)
Total Bilirubin: 1.5 mg/dL — ABNORMAL HIGH (ref 0.3–1.2)
Total Protein: 8.1 g/dL (ref 6.5–8.1)

## 2019-03-09 LAB — POCT URINALYSIS DIPSTICK
Bilirubin, UA: NEGATIVE
Blood, UA: NEGATIVE
Glucose, UA: NEGATIVE
Ketones, UA: NEGATIVE
Leukocytes, UA: NEGATIVE
Nitrite, UA: NEGATIVE
Protein, UA: NEGATIVE
Spec Grav, UA: 1.015 (ref 1.010–1.025)
Urobilinogen, UA: 0.2 E.U./dL
pH, UA: 6 (ref 5.0–8.0)

## 2019-03-09 LAB — CBC WITH DIFFERENTIAL/PLATELET
Abs Immature Granulocytes: 0.02 10*3/uL (ref 0.00–0.07)
Basophils Absolute: 0 10*3/uL (ref 0.0–0.1)
Basophils Relative: 0 %
Eosinophils Absolute: 0.1 10*3/uL (ref 0.0–0.5)
Eosinophils Relative: 3 %
HCT: 41.4 % (ref 36.0–46.0)
Hemoglobin: 14.3 g/dL (ref 12.0–15.0)
Immature Granulocytes: 0 %
Lymphocytes Relative: 27 %
Lymphs Abs: 1.5 10*3/uL (ref 0.7–4.0)
MCH: 33.8 pg (ref 26.0–34.0)
MCHC: 34.5 g/dL (ref 30.0–36.0)
MCV: 97.9 fL (ref 80.0–100.0)
Monocytes Absolute: 0.5 10*3/uL (ref 0.1–1.0)
Monocytes Relative: 9 %
Neutro Abs: 3.2 10*3/uL (ref 1.7–7.7)
Neutrophils Relative %: 61 %
Platelets: 107 10*3/uL — ABNORMAL LOW (ref 150–400)
RBC: 4.23 MIL/uL (ref 3.87–5.11)
RDW: 13.4 % (ref 11.5–15.5)
WBC: 5.3 10*3/uL (ref 4.0–10.5)
nRBC: 0 % (ref 0.0–0.2)

## 2019-03-09 LAB — TSH: TSH: 3.241 u[IU]/mL (ref 0.350–4.500)

## 2019-03-09 LAB — HEMOGLOBIN A1C
Hgb A1c MFr Bld: 5.9 % — ABNORMAL HIGH (ref 4.8–5.6)
Mean Plasma Glucose: 122.63 mg/dL

## 2019-03-09 MED ORDER — NADOLOL 20 MG PO TABS: 20.0000 mg | ORAL_TABLET | Freq: Every day | ORAL | 3 refills | Status: DC

## 2019-03-09 MED ORDER — CIPROFLOXACIN HCL 250 MG PO TABS: 250.0000 mg | ORAL_TABLET | Freq: Two times a day (BID) | ORAL | 0 refills | Status: AC

## 2019-03-09 NOTE — Telephone Encounter (Signed)
Pt is scheduled for 03/24/19 in [place]  At 8:30 am pt aware

## 2019-03-09 NOTE — Telephone Encounter (Signed)
-----   Message from Glennie Isle, [place] sent at 03/09/2019 11:41 AM EST ----- Regarding: FW: follow up Can you contact pt and schedule a follow up Cirrhosis with Dr. Allen Norris per Dr. Army Melia below. Pt is aware we will be calling. Next available please.   Thank you! ----- Message ----- From: Glean Hess, MD Sent: 03/09/2019  11:30 AM EST To: Glennie Isle, CMA Subject: follow up                                      Ms Tkaczyk has not seen Dr. Allen Norris for about one year.  Her last EGD was 06/2017.  I saw her today, refilled her nadolol and ordered labs. Please contact her with her next appointment and EGD. Thanks

## 2019-03-09 NOTE — Progress Notes (Signed)
Date:  03/09/2019   Name:  Jenna Franklin   DOB:  Apr 11, 1940   MRN:  [id number]   Chief Complaint: Diarrhea (Started sunday, monday and tuesday.) and Dysuria (Chills, burning when urinating, and hard to urinate when trying to go. Lower back pain. )  Diarrhea  This is a new problem. The problem has been resolved. The stool consistency is described as mucous and watery. The patient states that diarrhea does not awaken her from sleep. Associated symptoms include abdominal pain. Pertinent negatives include no chills, coughing or fever.  Dysuria  This is a new problem. The current episode started in the past 7 days. The problem has been unchanged. The quality of the pain is described as aching. The pain is mild. There has been no fever. Associated symptoms include frequency and urgency. Pertinent negatives include no chills or hematuria.  Cirrhosis with ascites and esophageal varices - she is doing well, maintaining her weight.  Started last year on nadolol and needs refills.  She is not sure when she is due to see Dr. Wohl again.  Last EGD 06/2017. Prediabetes - she has kept her weight stable.  She is exercising with PT.  She recently had cataract surgery.   Lab Results  Component Value Date   CREATININE 0.81 07/27/2018   BUN 14 07/27/2018   NA 136 07/27/2018   K 3.8 07/27/2018   CL 102 07/27/2018   CO2 25 07/27/2018   Lab Results  Component Value Date   CHOL 129 11/19/2017   HDL 45 11/19/2017   LDLCALC 61 11/19/2017   TRIG 115 11/19/2017   CHOLHDL 2.9 11/19/2017   Lab Results  Component Value Date   TSH 4.009 11/19/2017   Lab Results  Component Value Date   HGBA1C 6.0 (H) 07/27/2018     Review of Systems  Constitutional: Negative for chills, fatigue and fever.  Respiratory: Negative for cough, shortness of breath and wheezing.   Cardiovascular: Negative for chest pain, palpitations and leg swelling.  Gastrointestinal: Positive for abdominal pain and diarrhea (now  resolved).  Genitourinary: Positive for difficulty urinating, dysuria, frequency and urgency. Negative for hematuria.  Musculoskeletal: Positive for back pain (low back pain).  Neurological: Negative for dizziness.    Patient Active Problem List   Diagnosis Date Noted  . Lumbar spinal stenosis 07/27/2018  . Thoracic compression fracture, sequela 04/01/2018  . Hx of sinus tachycardia 07/06/2017  . Gastritis without bleeding   . Tendinopathy of right gluteus medius 01/01/2017  . Neck muscle spasm 11/18/2016  . Esophageal varices in cirrhosis (HCC)   . Cirrhosis of liver with ascites ([facility]) 05/29/2015  . Post-menopausal atrophic vaginitis 05/29/2015  . Acid reflux 03/25/2015  . Calculus of kidney 03/25/2015  . Pre-diabetes 09/20/2014  . Adjustment disorder with anxious mood 10/05/2013  . Bursitis, trochanteric 12/11/2011    Allergies  Allergen Reactions  . Baclofen Anxiety  . Codeine Nausea And Vomiting  . Sulfa Antibiotics Nausea Only  . Prednisone Anxiety    Past Surgical History:  Procedure Laterality Date  . ABDOMINAL HYSTERECTOMY    . APPENDECTOMY    . BREAST BIOPSY Right    neg needle bx  . CATARACT EXTRACTION W/PHACO Right 01/09/2019   Procedure: CATARACT EXTRACTION PHACO AND INTRAOCULAR LENS PLACEMENT (IOC) RIGHT 8.60,        00 :53.3;  Surgeon: Eulogio Bear, MD;  Location: [facility];  Service: Ophthalmology;  Laterality: Right;  . CATARACT EXTRACTION W/PHACO Left 01/30/2019   Procedure:  CATARACT EXTRACTION PHACO AND INTRAOCULAR LENS PLACEMENT (IOC) LEFT, 2.59, 00:25.5;  Surgeon: Eulogio Bear, MD;  Location: [facility];  Service: Ophthalmology;  Laterality: Left;  . COMBINED HYSTERECTOMY ABDOMINAL W/ A&P REPAIR / OOPHORECTOMY    . ESOPHAGOGASTRODUODENOSCOPY (EGD) WITH PROPOFOL N/A 06/17/2015   Procedure: ESOPHAGOGASTRODUODENOSCOPY (EGD) WITH PROPOFOL;  Surgeon: Lucilla Lame, MD;  Location: [facility];  Service: Endoscopy;   Laterality: N/A;  . ESOPHAGOGASTRODUODENOSCOPY (EGD) WITH PROPOFOL N/A 06/17/2017   Procedure: ESOPHAGOGASTRODUODENOSCOPY (EGD) WITH PROPOFOL;  Surgeon: Lucilla Lame, MD;  Location: [facility];  Service: Endoscopy;  Laterality: N/A;  . LAPAROSCOPIC CHOLECYSTECTOMY    . TONSILLECTOMY      Social History   Tobacco Use  . Smoking status: Never Smoker  . Smokeless tobacco: Never Used  . Tobacco comment: smoking cessation materials not required  Substance Use Topics  . Alcohol use: No    Alcohol/week: 0.0 standard drinks  . Drug use: No     Medication list has been reviewed and updated.  Current Meds  Medication Sig  . acetaminophen (TYLENOL) 500 MG tablet Take 500 mg by mouth every 6 (six) hours as needed.  . fluticasone (FLONASE) 50 MCG/ACT nasal spray Place 2 sprays into both nostrils daily.  . furosemide (LASIX) 40 MG tablet TAKE ONE (1) TABLET BY MOUTH ONCE DAILY  . spironolactone (ALDACTONE) 50 MG tablet TAKE ONE (1) TABLET BY MOUTH ONCE DAILY (Patient taking differently: Take 25 mg by mouth daily. am)  . vitamin E 400 UNIT capsule Take 400 Units by mouth daily.    PHQ 2/9 Scores 01/18/2019 07/27/2018 07/08/2018 04/01/2018  PHQ - 2 Score 0 0 0 1  PHQ- 9 Score - 3 0 -    BP Readings from Last 3 Encounters:  03/09/19 112/68  01/30/19 (!) 108/58  01/18/19 132/80    Physical Exam Constitutional:      Appearance: Normal appearance.  Cardiovascular:     Rate and Rhythm: Normal rate and regular rhythm.  Pulmonary:     Effort: Pulmonary effort is normal.     Breath sounds: No wheezing or rhonchi.  Abdominal:     Palpations: Abdomen is soft.     Tenderness: There is abdominal tenderness in the suprapubic area. There is no guarding or rebound.  Musculoskeletal:     Right lower leg: No edema.     Left lower leg: No edema.  Neurological:     Mental Status: She is alert.     Wt Readings from Last 3 Encounters:  03/09/19 162 lb (73.5 kg)  01/30/19 162 lb (73.5 kg)   01/18/19 162 lb (73.5 kg)    BP 112/68   Pulse 70   Temp 97.9 F (36.6 C) (Oral)   Ht 5' 2.5" (1.588 m)   Wt 162 lb (73.5 kg)   SpO2 99%   BMI 29.16 kg/m   Assessment and Plan: 1. Dysuria UA is negative but symptoms started after several days if diarrhea Will treat with 3 days of Cipro - ciprofloxacin (CIPRO) 250 MG tablet; Take 1 tablet (250 mg total) by mouth 2 (two) times daily for 3 days.  Dispense: 6 tablet; Refill: 0 - CBC with Differential/Platelet - POCT urinalysis dipstick  2. Cirrhosis of liver with ascites, unspecified hepatic cirrhosis type (HCC) Will refill meds and check labs Will message Dr. Allen Norris to see what follow up she needs - nadolol (CORGARD) 20 MG tablet; Take 1 tablet (20 mg total) by mouth daily.  Dispense: 90 tablet;  Refill: 3 - Comprehensive metabolic panel - TSH - AFP tumor marker  3. Pre-diabetes Check labs today - CBC with Differential/Platelet - Comprehensive metabolic panel - Hemoglobin A1c   Partially dictated using Editor, commissioning. Any errors are unintentional.  Halina Maidens, MD [facility]  03/09/2019

## 2019-03-10 LAB — AFP TUMOR MARKER: AFP, Serum, Tumor Marker: 6.6 ng/mL (ref 0.0–8.3)

## 2019-03-14 ENCOUNTER — Other Ambulatory Visit: Payer: Self-pay

## 2019-03-14 DIAGNOSIS — R300 Dysuria: Secondary | ICD-10-CM

## 2019-03-24 ENCOUNTER — Ambulatory Visit: Payer: Medicare HMO | Admitting: Gastroenterology

## 2019-03-28 ENCOUNTER — Telehealth: Payer: Self-pay | Admitting: Internal Medicine

## 2019-03-28 NOTE — Telephone Encounter (Signed)
Spoke with patient. She said she is having a lot of burning pains. Urine was clear last visit. Cannot see Urologist until next Tuesday. Can we see her to recheck urine?  Burning inside and outside. Neighbor gave her coconut oil but only slightly helps. Wants to know if she can try premarin until her visit next week with Urology.

## 2019-03-28 NOTE — Telephone Encounter (Signed)
I don't think that she has a urinary tract infection so rechecking her urine will not be helpful.  If I knew what was wrong I could give her some advice.  However, I do not think that premarin is the answer and it most likely will not be covered by her insurance because of her age.

## 2019-03-28 NOTE — Telephone Encounter (Signed)
Jenna Franklin have questions to ask dr berglund about her upcoming appointment to see the urologist.

## 2019-03-29 NOTE — Telephone Encounter (Signed)
Called and informed pt. Told to call back and discuss. Dr Army Melia said she may need to do an exam on her vagina. Waiting for call back to schedule.

## 2019-04-03 ENCOUNTER — Other Ambulatory Visit: Payer: Self-pay | Admitting: Family Medicine

## 2019-04-03 DIAGNOSIS — R300 Dysuria: Secondary | ICD-10-CM

## 2019-04-04 ENCOUNTER — Encounter: Payer: Self-pay | Admitting: Urology

## 2019-04-04 ENCOUNTER — Ambulatory Visit: Payer: Medicare HMO | Admitting: Urology

## 2019-04-04 ENCOUNTER — Other Ambulatory Visit
Admission: RE | Admit: 2019-04-04 | Discharge: 2019-04-04 | Disposition: A | Discharge status: Home / Self Care | Payer: Medicare HMO | Attending: Urology | Admitting: Urology

## 2019-04-04 ENCOUNTER — Other Ambulatory Visit: Payer: Self-pay

## 2019-04-04 VITALS — BP 122/68 | HR 77 | Ht 62.0 in | Wt 162.0 lb

## 2019-04-04 DIAGNOSIS — R300 Dysuria: Secondary | ICD-10-CM

## 2019-04-04 LAB — URINALYSIS, COMPLETE (UACMP) WITH MICROSCOPIC
Bacteria, UA: NONE SEEN
Bilirubin Urine: NEGATIVE
Glucose, UA: NEGATIVE mg/dL
Hgb urine dipstick: NEGATIVE
Ketones, ur: NEGATIVE mg/dL
Nitrite: NEGATIVE
Protein, ur: NEGATIVE mg/dL
RBC / HPF: NONE SEEN RBC/hpf (ref 0–5)
Specific Gravity, Urine: 1.025 (ref 1.005–1.030)
Squamous Epithelial / HPF: NONE SEEN (ref 0–5)
pH: 5 (ref 5.0–8.0)

## 2019-04-04 LAB — BLADDER SCAN AMB NON-IMAGING

## 2019-04-04 MED ORDER — FLUCONAZOLE 100 MG PO TABS: 100.0000 mg | ORAL_TABLET | Freq: Every day | ORAL | 0 refills | Status: DC

## 2019-04-04 NOTE — Addendum Note (Signed)
Addended by: Billey Co on: 04/04/2019 03:20 PM   Modules accepted: Orders

## 2019-04-04 NOTE — Progress Notes (Signed)
04/04/19 2:40 PM   Jenna Franklin 06/15/1940 [id number]  CC: Dysuria  HPI: I saw Jenna Franklin in urology clinic today for evaluation of dysuria.  She is a 79 year old female with medical history notable for cirrhosis and esophageal varices who reports dysuria and pelvic discomfort for the last 3 months.  She denies any flank pain or gross hematuria.  There are no aggravating or alleviating factors.  She has not had any prior positive urine cultures.  She has some urinary frequency when she takes Lasix, but denies any incontinence or difficulty urinating.  She has a distant history of kidney stones when she was in college.  She did have a urinalysis 01/18/2019 that showed greater than 50 RBCs, but no evidence of infection and was treated with antibiotics at that time.  She did not have any improvement on antibiotics.  Urine culture 03/09/2019 was benign with 0 RBCs, nitrite negative, no leukocytes.  Urinalysis today notable for budding yeast, 6-10 WBCs, 0 RBCs, no bacteria, nitrite negative.  PVR 54mL.  She is a never smoker, denies any family history of urologic malignancies, and denies any other carcinogenic exposures.  PMH: Past Medical History:  Diagnosis Date  . Anxiety   . Arthritis    back  . Bronchitis    currently  . CHF (congestive heart failure) ([place])    pt says she thinks she has this  . Cirrhosis of liver ([place]) 2016  . Depression   . Esophageal varices ([place])   . Esophageal varices ([place])   . Gastritis without bleeding 2019  . GERD (gastroesophageal reflux disease)   . H/O: depression   . History of kidney stones   . Hypertension   . Nerve root pain 12/11/2011  . Non-alcoholic fatty liver disease   . Nonalcoholic fatty liver disease   . Osteopenia   . Osteopenia   . Reflux    in past  . Spinal stenosis   . Tachycardia 03/25/2015  . Vertigo    3 yrs ago  . Vitamin D deficiency   . Vitamin D deficiency     Surgical History: Past Surgical History:  Procedure  Laterality Date  . ABDOMINAL HYSTERECTOMY    . APPENDECTOMY    . BREAST BIOPSY Right    neg needle bx  . CATARACT EXTRACTION W/PHACO Right 01/09/2019   Procedure: CATARACT EXTRACTION PHACO AND INTRAOCULAR LENS PLACEMENT (IOC) RIGHT 8.60,        00:53.3;  Surgeon: Eulogio Bear, MD;  Location: [facility];  Service: Ophthalmology;  Laterality: Right;  . CATARACT EXTRACTION W/PHACO Left 01/30/2019   Procedure: CATARACT EXTRACTION PHACO AND INTRAOCULAR LENS PLACEMENT (IOC) LEFT, 2.59, 00:25.5;  Surgeon: Eulogio Bear, MD;  Location: [facility];  Service: Ophthalmology;  Laterality: Left;  . COMBINED HYSTERECTOMY ABDOMINAL W/ A&P REPAIR / OOPHORECTOMY    . ESOPHAGOGASTRODUODENOSCOPY (EGD) WITH PROPOFOL N/A 06/17/2015   Procedure: ESOPHAGOGASTRODUODENOSCOPY (EGD) WITH PROPOFOL;  Surgeon: Lucilla Lame, MD;  Location: [facility];  Service: Endoscopy;  Laterality: N/A;  . ESOPHAGOGASTRODUODENOSCOPY (EGD) WITH PROPOFOL N/A 06/17/2017   Procedure: ESOPHAGOGASTRODUODENOSCOPY (EGD) WITH PROPOFOL;  Surgeon: Lucilla Lame, MD;  Location: [facility];  Service: Endoscopy;  Laterality: N/A;  . LAPAROSCOPIC CHOLECYSTECTOMY    . TONSILLECTOMY      Family History: Family History  Problem Relation Age of Onset  . Heart disease Mother   . Heart disease Father   . Heart attack Paternal Grandfather   . Asthma Son 30  .  Hypertension Daughter   . Breast cancer Maternal Aunt     Social History:  reports that she has never smoked. She has never used smokeless tobacco. She reports that she does not drink alcohol or use drugs.  Physical Exam: BP 122/68 (BP Location: Left Arm, Patient Position: Sitting, Cuff Size: Normal)   Pulse 77   Ht 5\' 2"  (1.575 m)   Wt 162 lb (73.5 kg)   BMI 29.63 kg/m    Constitutional:  Alert and oriented, No acute distress. Cardiovascular: No clubbing, cyanosis, or edema. Respiratory: Normal respiratory effort, no increased work of  breathing. GI: Abdomen is soft, nontender, nondistended, no abdominal masses GU: Normal appearing urethra, moderate cystyocele, no discharge or erythema Lymph: No cervical or inguinal lymphadenopathy. Skin: No rashes, bruises or suspicious lesions. Neurologic: Grossly intact, no focal deficits, moving all 4 extremities. Psychiatric: Normal mood and affect.  Laboratory Data: Prior urinalysis reviewed, see HPI Urinalysis today budding yeast, 6-10 WBCs,   Pertinent Imaging: None to review  Assessment & Plan:   In summary, she is a 79 year old female with 3 months of dysuria, no prior positive urine cultures.  She did have one episode of microscopic hematuria with greater than 50 RBCs on 01/18/2019, but this does not appear to have been sent for culture.  Her urinalysis today is consistent with a yeast infection.  Her pelvic exam is benign.  We discussed the most likely etiology of her symptoms is a yeast infection today.  I recommended fluconazole and Premarin cream, with close follow-up to confirm resolution of symptoms.  If she continues to have dysuria or pelvic pain, I would recommend proceeding with a hematuria work-up with her history of significant microscopic hematuria in December.  -Fluconazole 200 mg x 2 weeks -RTC 3 to 4 weeks for symptom check, consider further work-up with CT urogram and cystoscopy if persistent symptoms with her history of hematuria  I spent 50 total minutes on the day of the encounter including pre-visit review of the medical record, face-to-face time with the patient, and post visit ordering of labs/imaging/tests.  Nickolas Madrid, MD 04/04/2019  [facility] [address], [place],  [address] [phone]

## 2019-04-04 NOTE — Patient Instructions (Signed)
Conjugated Estrogens injection What is this medicine? CONJUGATED ESTROGENS (CON ju gate ed ESS troe jenz) is a mixture of female hormones. It is used to treat abnormal bleeding from the uterus caused by a hormonal imbalance. This medicine may also be used for a short period of time to increase your body's estrogen levels. This medicine may be used for other purposes; ask your health care provider or pharmacist if you have questions. COMMON BRAND NAME(S): Premarin What should I tell my health care provider before I take this medicine? They need to know if you have any of these conditions:  blood vessel disease, blood clotting disorder, or suffered a stroke  breast, cervical, endometrial, ovarian or uterine cancer  dementia  gallbladder disease  heart disease  high blood levels of calcium  kidney disease  liver disease  protein C deficiency  protein S deficiency  vaginal bleeding  an unusual or allergic reaction to estrogens, other hormones, medicines, foods, dyes, or preservatives  pregnant or trying to get pregnant  breast-feeding How should I use this medicine? This medicine is for injection into a vein, or injection into a muscle. It is given by a health care professional in a hospital or clinic setting. A patient package insert for the product will be given with each prescription and refill. Read this sheet carefully each time. The sheet may change frequently. Talk to your pediatrician regarding the use of this medicine in children. Special care may be needed. Overdosage: If you think you have taken too much of this medicine contact a poison control center or emergency room at once. NOTE: This medicine is only for you. Do not share this medicine with others. What if I miss a dose? This does not apply. What may interact with this medicine? Do not take this medicine with any of the following medications:  exemestane This medicine may also interact with the following  medications:  barbiturates or benzodiazepines used for inducing sleep or treating seizures (convulsions)  carbamazepine  grapefruit juice  medicines for fungal infections like ketoconazole and itraconazole  raloxifene or tamoxifen  rifabutin, rifampin, or rifapentine  ritonavir  some antibiotics used to treat infections  St. John's Wort  warfarin This list may not describe all possible interactions. Give your health care provider a list of all the medicines, herbs, non-prescription drugs, or dietary supplements you use. Also tell them if you smoke, drink alcohol, or use illegal drugs. Some items may interact with your medicine. What should I watch for while using this medicine? This medicine can make your body retain fluid, making your fingers, hands, or ankles swell. Your blood pressure can go up. Contact your doctor or health care professional if you feel you are retaining fluid. If you have any reason to think you are pregnant, stop taking this medicine right away and contact your doctor or health care professional. Smoking increases the risk of getting a blood clot or having a stroke while you are taking this medicine, especially if you are more than 79 years old. You are strongly advised not to smoke. If you are going to have surgery, you may need to stop taking this medicine. Consult your health care professional for advice before you schedule the surgery. What side effects may I notice from receiving this medicine? Side effects that you should report to your doctor or health care professional as soon as possible:  allergic reactions like skin rash, itching or hives, swelling of the face, lips, or tongue  breakthrough bleeding and  spotting  breast enlargement, tenderness, or abnormal production of milk  breathing problems  changes in vision  chest pain  confusion or forgetfulness  dark urine  general ill feeling or flu-like symptoms  leg, arm, or groin  pain  light-colored stools  loss of appetite, nausea  nausea, vomiting  right upper belly pain  severe headaches  stomach pain  speech problems  unusually weak or tired  yellowing of the eyes or skin Side effects that usually do not require medical attention (report to your doctor or health care professional if they continue or are bothersome):  change in appetite  mood changes, anxiety, depression, frustration, anger, or emotional outbursts  skin acne or brown spots on the face  weight gain This list may not describe all possible side effects. Call your doctor for medical advice about side effects. You may report side effects to FDA at 1-800-FDA-1088. Where should I keep my medicine? This drug is given in a hospital or clinic and will not be stored at home. NOTE: This sheet is a summary. It may not cover all possible information. If you have questions about this medicine, talk to your doctor, pharmacist, or health care provider.  2020 Elsevier/Gold Standard (2010-05-07 09:21:17)

## 2019-04-07 ENCOUNTER — Telehealth: Payer: Self-pay | Admitting: Urology

## 2019-04-07 DIAGNOSIS — N952 Postmenopausal atrophic vaginitis: Secondary | ICD-10-CM

## 2019-04-07 MED ORDER — PREMARIN 0.625 MG/GM VA CREA: TOPICAL_CREAM | VAGINAL | 1 refills | Status: DC

## 2019-04-07 NOTE — Telephone Encounter (Signed)
Patient was seen in the office on Tues and was given a savings card for Premarin cream.  She went to the pharmacy to pick up, but there was not a prescription for her.   Please contact patient with instructions.

## 2019-04-07 NOTE — Telephone Encounter (Signed)
Per providers note RX sent for Premarin. Called pt informed her that RX was sent to the pharmacy.

## 2019-04-10 ENCOUNTER — Ambulatory Visit: Payer: Medicare HMO | Admitting: Gastroenterology

## 2019-04-11 ENCOUNTER — Telehealth: Payer: Self-pay | Admitting: Urology

## 2019-04-11 ENCOUNTER — Other Ambulatory Visit: Payer: Self-pay | Admitting: Internal Medicine

## 2019-04-11 DIAGNOSIS — N952 Postmenopausal atrophic vaginitis: Secondary | ICD-10-CM

## 2019-04-11 DIAGNOSIS — K7460 Unspecified cirrhosis of liver: Secondary | ICD-10-CM

## 2019-04-11 MED ORDER — ESTRADIOL 0.1 MG/GM VA CREA: TOPICAL_CREAM | VAGINAL | 3 refills | Status: DC

## 2019-04-11 NOTE — Telephone Encounter (Signed)
Pt cannot afford premarin cream, I will call pt and encourage her to reach out to her insurance company to see if they will cover an alternative like Estrace or Intrarosa. I will instruct pt to complete diflucan. Do you have any additional recommendations? Thanks.

## 2019-04-11 NOTE — Addendum Note (Signed)
Addended by: Donalee Citrin on: 04/11/2019 04:13 PM   Modules accepted: Orders

## 2019-04-11 NOTE — Telephone Encounter (Signed)
Yes that's fine, thanks  Nickolas Madrid, MD 04/11/2019

## 2019-04-11 NOTE — Telephone Encounter (Signed)
Patient called the office today.    She is taking the difulcan and only has 6 pills left to take. She is feeling much better.  The premarin cream is too expensive.  Patient is wanting to know if there is something else that she can use instead.  Please call the patient to let her know.

## 2019-04-11 NOTE — Telephone Encounter (Signed)
Patient contacted her insurance company and they advised her that she should get Estrace cream.  She asked that we pass along this information.

## 2019-04-11 NOTE — Telephone Encounter (Signed)
RX sent

## 2019-04-11 NOTE — Telephone Encounter (Signed)
Ok to send Estrace?

## 2019-04-11 NOTE — Telephone Encounter (Signed)
No other recommendations, sounds like she may not need it anyway if improved on diflucan, thanks  Nickolas Madrid, MD 04/11/2019

## 2019-04-11 NOTE — Telephone Encounter (Signed)
Called pt she states that she has not been to the pharmacy yet and is unsure of what the medication will cost but was told by a friend that it would be $400. Advised pt to call pharmacy ask them to process the prescription with her insurance to find what the exact cost would be. Informed her that if RX was too expensive she should contact her insurance company to find what their preferred vaginal estrogen cream is if any, call us with that information and we will go from there. Advised pt to continue diflucan. Pt gave verbal understanding.

## 2019-04-21 ENCOUNTER — Telehealth: Payer: Self-pay | Admitting: *Deleted

## 2019-04-21 ENCOUNTER — Other Ambulatory Visit: Payer: Self-pay

## 2019-04-21 ENCOUNTER — Other Ambulatory Visit: Payer: Self-pay | Admitting: Family Medicine

## 2019-04-21 ENCOUNTER — Other Ambulatory Visit
Admission: RE | Admit: 2019-04-21 | Discharge: 2019-04-21 | Disposition: A | Discharge status: Home / Self Care | Payer: Medicare HMO | Attending: Physician Assistant | Admitting: Physician Assistant

## 2019-04-21 ENCOUNTER — Other Ambulatory Visit: Payer: Self-pay | Admitting: *Deleted

## 2019-04-21 DIAGNOSIS — R300 Dysuria: Secondary | ICD-10-CM | POA: Diagnosis not present

## 2019-04-21 LAB — URINALYSIS, COMPLETE (UACMP) WITH MICROSCOPIC
Bilirubin Urine: NEGATIVE
Glucose, UA: NEGATIVE mg/dL
Hgb urine dipstick: NEGATIVE
Ketones, ur: NEGATIVE mg/dL
Leukocytes,Ua: NEGATIVE
Nitrite: NEGATIVE
Protein, ur: NEGATIVE mg/dL
RBC / HPF: NONE SEEN RBC/hpf (ref 0–5)
Specific Gravity, Urine: 1.01 (ref 1.005–1.030)
pH: 6 (ref 5.0–8.0)

## 2019-04-21 NOTE — Telephone Encounter (Signed)
Advised patient that her urine looks normal. We will sent it off for culture .  Patient stop her premarin cream

## 2019-04-23 LAB — URINE CULTURE: Culture: <10000 — AB

## 2019-04-24 ENCOUNTER — Telehealth: Payer: Self-pay | Admitting: Physician Assistant

## 2019-04-24 NOTE — Telephone Encounter (Signed)
Called pt informed her of the information below. Pt gave verbal understanding. Pt states that she has cancelled her follow up as she can hardly walk and is having trouble with her legs.

## 2019-04-24 NOTE — Telephone Encounter (Signed)
Please contact the patient and inform her that her urine culture has resulted negative.  She does not have a urinary tract infection.  I recommend that she keep her scheduled follow-up with Dr. Diamantina Providence next week.

## 2019-04-25 DIAGNOSIS — N949 Unspecified condition associated with female genital organs and menstrual cycle: Secondary | ICD-10-CM | POA: Diagnosis not present

## 2019-04-25 DIAGNOSIS — B373 Candidiasis of vulva and vagina: Secondary | ICD-10-CM | POA: Diagnosis not present

## 2019-05-02 ENCOUNTER — Ambulatory Visit: Payer: Medicare HMO | Admitting: Urology

## 2019-05-17 DIAGNOSIS — M26609 Unspecified temporomandibular joint disorder, unspecified side: Secondary | ICD-10-CM | POA: Diagnosis not present

## 2019-05-17 DIAGNOSIS — H9209 Otalgia, unspecified ear: Secondary | ICD-10-CM | POA: Diagnosis not present

## 2019-05-17 DIAGNOSIS — K219 Gastro-esophageal reflux disease without esophagitis: Secondary | ICD-10-CM | POA: Diagnosis not present

## 2019-05-25 ENCOUNTER — Other Ambulatory Visit: Payer: Self-pay | Admitting: Internal Medicine

## 2019-05-25 DIAGNOSIS — K7460 Unspecified cirrhosis of liver: Secondary | ICD-10-CM

## 2019-05-26 ENCOUNTER — Encounter: Payer: Self-pay | Admitting: Internal Medicine

## 2019-05-26 ENCOUNTER — Telehealth: Payer: Self-pay | Admitting: Internal Medicine

## 2019-05-26 ENCOUNTER — Other Ambulatory Visit: Payer: Self-pay | Admitting: Internal Medicine

## 2019-05-26 DIAGNOSIS — M26629 Arthralgia of temporomandibular joint, unspecified side: Secondary | ICD-10-CM | POA: Insufficient documentation

## 2019-05-26 NOTE — Telephone Encounter (Signed)
Pt wants to know why medication dosage was cut in half, she normally takes 50 mg and now is has to take half    spironolactone (ALDACTONE) 50 MG tablet [id number]

## 2019-07-04 ENCOUNTER — Other Ambulatory Visit: Payer: Self-pay

## 2019-07-04 ENCOUNTER — Encounter: Payer: Self-pay | Admitting: Internal Medicine

## 2019-07-04 ENCOUNTER — Ambulatory Visit (INDEPENDENT_AMBULATORY_CARE_PROVIDER_SITE_OTHER): Payer: Medicare HMO | Admitting: Internal Medicine

## 2019-07-04 VITALS — BP 118/64 | HR 68 | Temp 98.1°F | Ht 62.5 in | Wt 159.0 lb

## 2019-07-04 DIAGNOSIS — I8510 Secondary esophageal varices without bleeding: Secondary | ICD-10-CM | POA: Diagnosis not present

## 2019-07-04 DIAGNOSIS — M5431 Sciatica, right side: Secondary | ICD-10-CM | POA: Diagnosis not present

## 2019-07-04 DIAGNOSIS — K7460 Unspecified cirrhosis of liver: Secondary | ICD-10-CM

## 2019-07-04 MED ORDER — CYCLOBENZAPRINE HCL 10 MG PO TABS: 5.0000 mg | ORAL_TABLET | Freq: Every day | ORAL | 0 refills | Status: DC

## 2019-07-04 NOTE — Patient Instructions (Addendum)
Take Tylenol 500 mg - one four times a day  Use Heat or Ice alternating  Take Flexeril 1/2 or 1 at bedtime Sciatica  Sciatica is pain, numbness, weakness, or tingling along the path of the sciatic nerve. The sciatic nerve starts in the lower back and runs down the back of each leg. The nerve controls the muscles in the lower leg and in the back of the knee. It also provides feeling (sensation) to the back of the thigh, the lower leg, and the sole of the foot. Sciatica is a symptom of another medical condition that pinches or puts pressure on the sciatic nerve. Sciatica most often only affects one side of the body. Sciatica usually goes away on its own or with treatment. In some cases, sciatica may come back (recur). What are the causes? This condition is caused by pressure on the sciatic nerve or pinching of the nerve. This may be the result of:  A disk in between the bones of the spine bulging out too far (herniated disk).  Age-related changes in the spinal disks.  A pain disorder that affects a muscle in the buttock.  Extra bone growth near the sciatic nerve.  A break (fracture) of the pelvis.  Pregnancy.  Tumor. This is rare. What increases the risk? The following factors may make you more likely to develop this condition:  Playing sports that place pressure or stress on the spine.  Having poor strength and flexibility.  A history of back injury or surgery.  Sitting for long periods of time.  Doing activities that involve repetitive bending or lifting.  Obesity. What are the signs or symptoms? Symptoms can vary from mild to very severe, and they may include:  Any of these problems in the lower back, leg, hip, or buttock: ? Mild tingling, numbness, or dull aches. ? Burning sensations. ? Sharp pains.  Numbness in the back of the calf or the sole of the foot.  Leg weakness.  Severe back pain that makes movement difficult. Symptoms may get worse when you cough,  sneeze, or laugh, or when you sit or stand for long periods of time. How is this diagnosed? This condition may be diagnosed based on:  Your symptoms and medical history.  A physical exam.  Blood tests.  Imaging tests, such as: ? X-rays. ? MRI. ? CT scan. How is this treated? In many cases, this condition improves on its own without treatment. However, treatment may include:  Reducing or modifying physical activity.  Exercising and stretching.  Icing and applying heat to the affected area.  Medicines that help to: ? Relieve pain and swelling. ? Relax your muscles.  Injections of medicines that help to relieve pain, irritation, and inflammation around the sciatic nerve (steroids).  Surgery. Follow these instructions at home: Medicines  Take over-the-counter and prescription medicines only as told by your health care provider.  Ask your health care provider if the medicine prescribed to you: ? Requires you to avoid driving or using heavy machinery. ? Can cause constipation. You may need to take these actions to prevent or treat constipation:  Drink enough fluid to keep your urine pale yellow.  Take over-the-counter or prescription medicines.  Eat foods that are high in fiber, such as beans, whole grains, and fresh fruits and vegetables.  Limit foods that are high in fat and processed sugars, such as fried or sweet foods. Managing pain      If directed, put ice on the affected area. ?  Put ice in a plastic bag. ? Place a towel between your skin and the bag. ? Leave the ice on for 20 minutes, 2-3 times a day.  If directed, apply heat to the affected area. Use the heat source that your health care provider recommends, such as a moist heat pack or a heating pad. ? Place a towel between your skin and the heat source. ? Leave the heat on for 20-30 minutes. ? Remove the heat if your skin turns bright red. This is especially important if you are unable to feel pain,  heat, or cold. You may have a greater risk of getting burned. Activity   Return to your normal activities as told by your health care provider. Ask your health care provider what activities are safe for you.  Avoid activities that make your symptoms worse.  Take brief periods of rest throughout the day. ? When you rest for longer periods, mix in some mild activity or stretching between periods of rest. This will help to prevent stiffness and pain. ? Avoid sitting for long periods of time without moving. Get up and move around at least one time each hour.  Exercise and stretch regularly, as told by your health care provider.  Do not lift anything that is heavier than 10 lb (4.5 kg) while you have symptoms of sciatica. When you do not have symptoms, you should still avoid heavy lifting, especially repetitive heavy lifting.  When you lift objects, always use proper lifting technique, which includes: ? Bending your knees. ? Keeping the load close to your body. ? Avoiding twisting. General instructions  Maintain a healthy weight. Excess weight puts extra stress on your back.  Wear supportive, comfortable shoes. Avoid wearing high heels.  Avoid sleeping on a mattress that is too soft or too hard. A mattress that is firm enough to support your back when you sleep may help to reduce your pain.  Keep all follow-up visits as told by your health care provider. This is important. Contact a health care provider if:  You have pain that: ? Wakes you up when you are sleeping. ? Gets worse when you lie down. ? Is worse than you have experienced in the past. ? Lasts longer than 4 weeks.  You have an unexplained weight loss. Get help right away if:  You are not able to control when you urinate or have bowel movements (incontinence).  You have: ? Weakness in your lower back, pelvis, buttocks, or legs that gets worse. ? Redness or swelling of your back. ? A burning sensation when you  urinate. Summary  Sciatica is pain, numbness, weakness, or tingling along the path of the sciatic nerve.  This condition is caused by pressure on the sciatic nerve or pinching of the nerve.  Sciatica can cause pain, numbness, or tingling in the lower back, legs, hips, and buttocks.  Treatment often includes rest, exercise, medicines, and applying ice or heat. This information is not intended to replace advice given to you by your health care provider. Make sure you discuss any questions you have with your health care provider. Document Revised: 02/21/2018 Document Reviewed: 02/21/2018 Elsevier Patient Education  [employer].

## 2019-07-04 NOTE — Progress Notes (Signed)
Date:  07/04/2019   Name:  Jenna Franklin   DOB:  [birth year]   MRN:  [id number]   Chief Complaint: Hip Pain [place] about a month ago on her right hip and her hip and leg is hurting. She is aware she has inflammation in her hip through [place]. When getting it and out of the bed she is in severe pain. ) and Foot Pain (Numb and freezing cold right foot. )  Hip Pain  The incident occurred more than 1 week ago. The injury mechanism was a fall. The pain is present in the right hip. The quality of the pain is described as burning and cramping (radiating to the right foot which feels cold). The pain is mild. The pain has been constant since onset. Pertinent negatives include no numbness or tingling. The symptoms are aggravated by palpation (and sitting).    Lab Results  Component Value Date   CREATININE 0.90 03/09/2019   BUN 17 03/09/2019   NA 133 (L) 03/09/2019   K 3.7 03/09/2019   CL 102 03/09/2019   CO2 21 (L) 03/09/2019   Lab Results  Component Value Date   CHOL 129 11/19/2017   HDL 45 11/19/2017   LDLCALC 61 11/19/2017   TRIG 115 11/19/2017   CHOLHDL 2.9 11/19/2017   Lab Results  Component Value Date   TSH 3.241 03/09/2019   Lab Results  Component Value Date   HGBA1C 5.9 (H) 03/09/2019   Lab Results  Component Value Date   WBC 5.3 03/09/2019   HGB 14.3 03/09/2019   HCT 41.4 03/09/2019   MCV 97.9 03/09/2019   PLT 107 (L) 03/09/2019   Lab Results  Component Value Date   ALT 42 03/09/2019   AST 43 (H) 03/09/2019   ALKPHOS 72 03/09/2019   BILITOT 1.5 (H) 03/09/2019     Review of Systems  Constitutional: Negative for chills, fatigue and fever.  Respiratory: Negative for chest tightness and shortness of breath.   Cardiovascular: Negative for chest pain and leg swelling.  Musculoskeletal: Positive for arthralgias and myalgias.  Neurological: Negative for dizziness, tingling, numbness and headaches.    Patient Active Problem List   Diagnosis Date Noted  .  TMJ arthralgia 05/26/2019  . Lumbar spinal stenosis 07/27/2018  . Thoracic compression fracture, sequela 04/01/2018  . Hx of sinus tachycardia 07/06/2017  . Gastritis without bleeding   . Tendinopathy of right gluteus medius 01/01/2017  . Neck muscle spasm 11/18/2016  . Esophageal varices in cirrhosis (HCC)   . Cirrhosis of liver with ascites ([place]) 05/29/2015  . Post-menopausal atrophic vaginitis 05/29/2015  . Acid reflux 03/25/2015  . Calculus of kidney 03/25/2015  . Pre-diabetes 09/20/2014  . Adjustment disorder with anxious mood 10/05/2013  . Bursitis, trochanteric 12/11/2011    Allergies  Allergen Reactions  . Baclofen Anxiety  . Codeine Nausea And Vomiting  . Sulfa Antibiotics Nausea Only  . Prednisone Anxiety    Past Surgical History:  Procedure Laterality Date  . ABDOMINAL HYSTERECTOMY    . APPENDECTOMY    . BREAST BIOPSY Right    neg needle bx  . CATARACT EXTRACTION W/PHACO Right 01/09/2019   Procedure: CATARACT EXTRACTION PHACO AND INTRAOCULAR LENS PLACEMENT (IOC) RIGHT 8.60,        00:53.3;  Surgeon: Eulogio Bear, MD;  Location: [place];  Service: Ophthalmology;  Laterality: Right;  . CATARACT EXTRACTION W/PHACO Left 01/30/2019   Procedure: CATARACT EXTRACTION PHACO AND INTRAOCULAR LENS PLACEMENT (IOC) LEFT, 2.59,  00:25.5;  Surgeon: Eulogio Bear, MD;  Location: [place];  Service: Ophthalmology;  Laterality: Left;  . COMBINED HYSTERECTOMY ABDOMINAL W/ A&P REPAIR / OOPHORECTOMY    . ESOPHAGOGASTRODUODENOSCOPY (EGD) WITH PROPOFOL N/A 06/17/2015   Procedure: ESOPHAGOGASTRODUODENOSCOPY (EGD) WITH PROPOFOL;  Surgeon: Lucilla Lame, MD;  Location: [place];  Service: Endoscopy;  Laterality: N/A;  . ESOPHAGOGASTRODUODENOSCOPY (EGD) WITH PROPOFOL N/A 06/17/2017   Procedure: ESOPHAGOGASTRODUODENOSCOPY (EGD) WITH PROPOFOL;  Surgeon: Lucilla Lame, MD;  Location: [place];  Service: Endoscopy;  Laterality: N/A;  . LAPAROSCOPIC  CHOLECYSTECTOMY    . TONSILLECTOMY      Social History   Tobacco Use  . Smoking status: Never Smoker  . Smokeless tobacco: Never Used  . Tobacco comment: smoking cessation materials not required  Substance Use Topics  . Alcohol use: No    Alcohol/week: 0.0 standard drinks  . Drug use: No     Medication list has been reviewed and updated.  Current Meds  Medication Sig  . acetaminophen (TYLENOL) 500 MG tablet Take 500 mg by mouth every 6 (six) hours as needed.  . fluticasone (FLONASE) 50 MCG/ACT nasal spray Place 2 sprays into both nostrils daily.  . furosemide (LASIX) 40 MG tablet TAKE (1) TABLET BY MOUTH EVERY DAY  . nadolol (CORGARD) 20 MG tablet Take 1 tablet (20 mg total) by mouth daily.  Marland Kitchen spironolactone (ALDACTONE) 50 MG tablet Take 0.5 tablets (25 mg total) by mouth daily. am    PHQ 2/9 Scores 01/18/2019 07/27/2018 07/08/2018 04/01/2018  PHQ - 2 Score 0 0 0 1  PHQ- 9 Score - 3 0 -    BP Readings from Last 3 Encounters:  07/04/19 118/64  04/04/19 122/68  03/09/19 112/68    Physical Exam Vitals and nursing note reviewed.  Constitutional:      General: [name].    Appearance: Normal appearance. She is well-developed.  HENT:     Head: Normocephalic and atraumatic.  Cardiovascular:     Rate and Rhythm: Normal rate and regular rhythm.     Pulses:          Dorsalis pedis pulses are 2+ on the right side and 2+ on the left side.       Posterior tibial pulses are 2+ on the right side and 2+ on the left side.  Pulmonary:     Effort: Pulmonary effort is normal. No respiratory distress.     Breath sounds: No wheezing or rhonchi.  Musculoskeletal:     Cervical back: Normal range of motion.     Lumbar back: No spasms, tenderness or bony tenderness. Positive right straight leg raise test. Negative left straight leg raise test.     Right hip: Tenderness (along lateral hip) present. No crepitus. Decreased range of motion.     Right lower leg: No edema.       Left lower leg: No edema.  Lymphadenopathy:     Cervical: No cervical adenopathy.  Skin:    General: Skin is warm and dry.     Capillary Refill: Capillary refill takes less than 2 seconds.     Findings: No rash.  Neurological:     Mental Status: She is alert and oriented to person, place, and time.     Sensory: Sensation is intact.     Deep Tendon Reflexes:     Reflex Scores:      Patellar reflexes are 2+ on the right side and 2+ on the left side.  Achilles reflexes are 2+ on the right side and 2+ on the left side. Psychiatric:        Behavior: Behavior normal.        Thought Content: Thought content normal.     Wt Readings from Last 3 Encounters:  07/04/19 159 lb (72.1 kg)  04/04/19 162 lb (73.5 kg)  03/09/19 162 lb (73.5 kg)    BP 118/64   Pulse 68   Temp 98.1 F (36.7 C) (Oral)   Ht 5' 2.5" (1.588 m)   Wt 159 lb (72.1 kg)   SpO2 98%   BMI 28.62 kg/m   Assessment and Plan: 1. Sciatica of right side Continue alternating Ice and Heat Take Tylenol 500 mg qid (can not take nsaids due to liver disease) Add flexeril at bedtime - cyclobenzaprine (FLEXERIL) 10 MG tablet; Take 0.5-1 tablets (5-10 mg total) by mouth at bedtime.  Dispense: 30 tablet; Refill: 0  2. Esophageal varices in cirrhosis (HCC) Stable on current medications Last [place] was in 2019 No recent GI follow up Continue current therapy; avoid all nsaids   Partially dictated using Dragon software. Any errors are unintentional.  Halina Maidens, MD [facility]  07/04/2019

## 2019-08-08 ENCOUNTER — Other Ambulatory Visit: Payer: Self-pay | Admitting: Internal Medicine

## 2019-08-08 DIAGNOSIS — Z1231 Encounter for screening mammogram for malignant neoplasm of breast: Secondary | ICD-10-CM

## 2019-08-28 ENCOUNTER — Ambulatory Visit: Payer: Medicare HMO

## 2019-09-06 ENCOUNTER — Other Ambulatory Visit: Payer: Self-pay

## 2019-09-06 ENCOUNTER — Encounter: Payer: Self-pay | Admitting: Internal Medicine

## 2019-09-06 ENCOUNTER — Ambulatory Visit (INDEPENDENT_AMBULATORY_CARE_PROVIDER_SITE_OTHER): Payer: Medicare HMO | Admitting: Internal Medicine

## 2019-09-06 ENCOUNTER — Other Ambulatory Visit
Admission: RE | Admit: 2019-09-06 | Discharge: 2019-09-06 | Disposition: A | Discharge status: Home / Self Care | Payer: Medicare HMO | Attending: Internal Medicine | Admitting: Internal Medicine

## 2019-09-06 VITALS — BP 112/72 | HR 82 | Temp 98.5°F | Ht 62.5 in | Wt 161.0 lb

## 2019-09-06 DIAGNOSIS — R7303 Prediabetes: Secondary | ICD-10-CM | POA: Diagnosis not present

## 2019-09-06 DIAGNOSIS — R188 Other ascites: Secondary | ICD-10-CM | POA: Diagnosis not present

## 2019-09-06 DIAGNOSIS — I8510 Secondary esophageal varices without bleeding: Secondary | ICD-10-CM | POA: Diagnosis not present

## 2019-09-06 DIAGNOSIS — Z1159 Encounter for screening for other viral diseases: Secondary | ICD-10-CM | POA: Diagnosis not present

## 2019-09-06 DIAGNOSIS — M7062 Trochanteric bursitis, left hip: Secondary | ICD-10-CM | POA: Diagnosis not present

## 2019-09-06 DIAGNOSIS — M48061 Spinal stenosis, lumbar region without neurogenic claudication: Secondary | ICD-10-CM | POA: Diagnosis not present

## 2019-09-06 DIAGNOSIS — K7460 Unspecified cirrhosis of liver: Secondary | ICD-10-CM

## 2019-09-06 DIAGNOSIS — M7061 Trochanteric bursitis, right hip: Secondary | ICD-10-CM | POA: Diagnosis not present

## 2019-09-06 DIAGNOSIS — Z0000 Encounter for general adult medical examination without abnormal findings: Secondary | ICD-10-CM | POA: Insufficient documentation

## 2019-09-06 DIAGNOSIS — R0602 Shortness of breath: Secondary | ICD-10-CM | POA: Insufficient documentation

## 2019-09-06 LAB — CBC WITH DIFFERENTIAL/PLATELET
Abs Immature Granulocytes: 0.01 10*3/uL (ref 0.00–0.07)
Basophils Absolute: 0 10*3/uL (ref 0.0–0.1)
Basophils Relative: 0 %
Eosinophils Absolute: 0.1 10*3/uL (ref 0.0–0.5)
Eosinophils Relative: 3 %
HCT: 41.8 % (ref 36.0–46.0)
Hemoglobin: 14.4 g/dL (ref 12.0–15.0)
Immature Granulocytes: 0 %
Lymphocytes Relative: 23 %
Lymphs Abs: 1 10*3/uL (ref 0.7–4.0)
MCH: 34.2 pg — ABNORMAL HIGH (ref 26.0–34.0)
MCHC: 34.4 g/dL (ref 30.0–36.0)
MCV: 99.3 fL (ref 80.0–100.0)
Monocytes Absolute: 0.4 10*3/uL (ref 0.1–1.0)
Monocytes Relative: 8 %
Neutro Abs: 2.9 10*3/uL (ref 1.7–7.7)
Neutrophils Relative %: 66 %
Platelets: 98 10*3/uL — ABNORMAL LOW (ref 150–400)
RBC: 4.21 MIL/uL (ref 3.87–5.11)
RDW: 13.2 % (ref 11.5–15.5)
WBC: 4.3 10*3/uL (ref 4.0–10.5)
nRBC: 0 % (ref 0.0–0.2)

## 2019-09-06 LAB — COMPREHENSIVE METABOLIC PANEL
ALT: 56 U/L — ABNORMAL HIGH (ref 0–44)
AST: 55 U/L — ABNORMAL HIGH (ref 15–41)
Albumin: 4 g/dL (ref 3.5–5.0)
Alkaline Phosphatase: 91 U/L (ref 38–126)
Anion gap: 10 (ref 5–15)
BUN: 15 mg/dL (ref 8–23)
CO2: 25 mmol/L (ref 22–32)
Calcium: 9.4 mg/dL (ref 8.9–10.3)
Chloride: 104 mmol/L (ref 98–111)
Creatinine, Ser: 0.83 mg/dL (ref 0.44–1.00)
GFR calc Af Amer: >60 mL/min (ref >60)
GFR calc non Af Amer: >60 mL/min (ref >60)
Glucose, Bld: 184 mg/dL — ABNORMAL HIGH (ref 70–99)
Potassium: 3.9 mmol/L (ref 3.5–5.1)
Sodium: 139 mmol/L (ref 135–145)
Total Bilirubin: 1.6 mg/dL — ABNORMAL HIGH (ref 0.3–1.2)
Total Protein: 7.8 g/dL (ref 6.5–8.1)

## 2019-09-06 LAB — HEMOGLOBIN A1C
Hgb A1c MFr Bld: 6.4 % — ABNORMAL HIGH (ref 4.8–5.6)
Mean Plasma Glucose: 136.98 mg/dL

## 2019-09-06 LAB — LIPID PANEL
Cholesterol: 139 mg/dL (ref 0–200)
HDL: 48 mg/dL (ref >40)
LDL Cholesterol: 74 mg/dL (ref 0–99)
Total CHOL/HDL Ratio: 2.9 RATIO
Triglycerides: 85 mg/dL (ref <150)
VLDL: 17 mg/dL (ref 0–40)

## 2019-09-06 LAB — POCT URINALYSIS DIPSTICK
Bilirubin, UA: NEGATIVE
Blood, UA: NEGATIVE
Glucose, UA: NEGATIVE
Ketones, UA: NEGATIVE
Leukocytes, UA: NEGATIVE
Nitrite, UA: NEGATIVE
Protein, UA: NEGATIVE
Spec Grav, UA: >=1.03 — AB (ref 1.010–1.025)
Urobilinogen, UA: 0.2 E.U./dL
pH, UA: 5 (ref 5.0–8.0)

## 2019-09-06 LAB — TSH: TSH: 3.487 u[IU]/mL (ref 0.350–4.500)

## 2019-09-06 LAB — HEPATITIS C ANTIBODY: HCV Ab: NONREACTIVE

## 2019-09-06 NOTE — Patient Instructions (Addendum)
Charlcie Cradle, MD Orthopedics [address]  [place], [place] [address]  [phone]  [phone] (Fax)   Lidoderm or Solanpas pain patches - try them on each hip at the tender area

## 2019-09-06 NOTE — Progress Notes (Signed)
Date:  09/06/2019   Name:  Jenna Franklin   DOB:  Sep 09, 1940   MRN:  [id number]   Chief Complaint: Annual Exam (no breast exam no pap)  Jenna Franklin is a 79 y.o. female who presents today for her Complete Annual Exam. She feels well. She reports exercising walking 4-5 days. She reports she is sleeping fairly well. Breast complaints none.  Mammogram: scheduled for next week. DEXA: 2015 Pap smear: discontinued Colonoscopy: none  Immunization History  Administered Date(s) Administered  . Influenza, High Dose Seasonal PF 11/16/2016  . Influenza,inj,Quad PF,6+ Mos 02/18/2016  . Influenza,inj,quad, With Preservative 12/21/2017, 11/23/2018  . Moderna SARS-COVID-2 Vaccination 04/22/2019, 05/20/2019  . Pneumococcal Conjugate-13 11/16/2016  . Pneumococcal Polysaccharide-23 03/01/2018  . Tdap 01/11/2017   Diabetes She presents for her follow-up diabetic visit. Diabetes type: prediabetes. Her disease course has been stable. Pertinent negatives for hypoglycemia include no dizziness, headaches, nervousness/anxiousness or tremors. Pertinent negatives for diabetes include no chest pain, no fatigue, no polydipsia and no polyuria.  Hip Pain  There was no injury mechanism. The pain is present in the left hip and right hip. The quality of the pain is described as aching and shooting. The pain is moderate. The pain has been fluctuating since onset. Associated symptoms include an inability to bear weight and a loss of motion. She has tried acetaminophen (seen by Ortho in the past; hx of low back issues for years; hx of steroid injections; last at [facility] 2018) for the symptoms. The treatment provided mild relief.   Cirrhosis of liver - followed and managed by GI.  Last AFP was 02/2019.  Last [place] 2019.  She is doing well, weights are stable.  Taking nadolol, spironolactone and lasix daily.  Lab Results  Component Value Date   CREATININE 0.90 03/09/2019   BUN 17 03/09/2019   NA 133 (L) 03/09/2019     K 3.7 03/09/2019   CL 102 03/09/2019   CO2 21 (L) 03/09/2019   Lab Results  Component Value Date   CHOL 129 11/19/2017   HDL 45 11/19/2017   LDLCALC 61 11/19/2017   TRIG 115 11/19/2017   CHOLHDL 2.9 11/19/2017   Lab Results  Component Value Date   TSH 3.241 03/09/2019   Lab Results  Component Value Date   HGBA1C 5.9 (H) 03/09/2019   Lab Results  Component Value Date   WBC 5.3 03/09/2019   HGB 14.3 03/09/2019   HCT 41.4 03/09/2019   MCV 97.9 03/09/2019   PLT 107 (L) 03/09/2019   Lab Results  Component Value Date   ALT 42 03/09/2019   AST 43 (H) 03/09/2019   ALKPHOS 72 03/09/2019   BILITOT 1.5 (H) 03/09/2019     Review of Systems  Constitutional: Negative for chills, fatigue and fever.  HENT: Negative for congestion, hearing loss, tinnitus, trouble swallowing and voice change.   Eyes: Negative for visual disturbance.  Respiratory: Positive for shortness of breath (sometimes). Negative for cough, chest tightness and wheezing.   Cardiovascular: Negative for chest pain, palpitations and leg swelling.  Gastrointestinal: Negative for abdominal pain, constipation, diarrhea and vomiting.  Endocrine: Negative for polydipsia and polyuria.  Genitourinary: Negative for dysuria, frequency, genital sores, vaginal bleeding and vaginal discharge.  Musculoskeletal: Positive for back pain and gait problem. Negative for arthralgias and joint swelling.  Skin: Negative for color change and rash.  Neurological: Negative for dizziness, tremors, light-headedness and headaches.  Hematological: Negative for adenopathy. Does not bruise/bleed easily.  Psychiatric/Behavioral: Negative for  dysphoric mood and sleep disturbance. The patient is not nervous/anxious.     Patient Active Problem List   Diagnosis Date Noted  . TMJ arthralgia 05/26/2019  . Lumbar spinal stenosis 07/27/2018  . Thoracic compression fracture, sequela 04/01/2018  . Hx of sinus tachycardia 07/06/2017  . Tendinopathy  of right gluteus medius 01/01/2017  . Neck muscle spasm 11/18/2016  . Esophageal varices in cirrhosis (HCC)   . Cirrhosis of liver with ascites ([place]) 05/29/2015  . Post-menopausal atrophic vaginitis 05/29/2015  . Acid reflux 03/25/2015  . Calculus of kidney 03/25/2015  . Pre-diabetes 09/20/2014  . Adjustment disorder with anxious mood 10/05/2013  . Bursitis, trochanteric 12/11/2011    Allergies  Allergen Reactions  . Baclofen Anxiety  . Codeine Nausea And Vomiting  . Sulfa Antibiotics Nausea Only  . Prednisone Anxiety    Past Surgical History:  Procedure Laterality Date  . ABDOMINAL HYSTERECTOMY    . APPENDECTOMY    . BREAST BIOPSY Right    neg needle bx  . CATARACT EXTRACTION W/PHACO Right 01/09/2019   Procedure: CATARACT EXTRACTION PHACO AND INTRAOCULAR LENS PLACEMENT (IOC) RIGHT 8.60,        00:53.3;  Surgeon: Eulogio Bear, MD;  Location: [place];  Service: Ophthalmology;  Laterality: Right;  . CATARACT EXTRACTION W/PHACO Left 01/30/2019   Procedure: CATARACT EXTRACTION PHACO AND INTRAOCULAR LENS PLACEMENT (IOC) LEFT, 2.59, 00:25.5;  Surgeon: Eulogio Bear, MD;  Location: [place];  Service: Ophthalmology;  Laterality: Left;  . COMBINED HYSTERECTOMY ABDOMINAL W/ A&P REPAIR / OOPHORECTOMY    . ESOPHAGOGASTRODUODENOSCOPY (EGD) WITH PROPOFOL N/A 06/17/2015   Procedure: ESOPHAGOGASTRODUODENOSCOPY (EGD) WITH PROPOFOL;  Surgeon: Lucilla Lame, MD;  Location: [place];  Service: Endoscopy;  Laterality: N/A;  . ESOPHAGOGASTRODUODENOSCOPY (EGD) WITH PROPOFOL N/A 06/17/2017   Procedure: ESOPHAGOGASTRODUODENOSCOPY (EGD) WITH PROPOFOL;  Surgeon: Lucilla Lame, MD;  Location: [place];  Service: Endoscopy;  Laterality: N/A;  . LAPAROSCOPIC CHOLECYSTECTOMY    . TONSILLECTOMY      Social History   Tobacco Use  . Smoking status: Never Smoker  . Smokeless tobacco: Never Used  . Tobacco comment: smoking cessation materials not required    Vaping Use  . Vaping Use: Never used  Substance Use Topics  . Alcohol use: No    Alcohol/week: 0.0 standard drinks  . Drug use: No     Medication list has been reviewed and updated.  Current Meds  Medication Sig  . acetaminophen (TYLENOL) 500 MG tablet Take 500 mg by mouth every 6 (six) hours as needed.  . cyclobenzaprine (FLEXERIL) 10 MG tablet Take 0.5-1 tablets (5-10 mg total) by mouth at bedtime.  . fluticasone (FLONASE) 50 MCG/ACT nasal spray Place 2 sprays into both nostrils daily.  . furosemide (LASIX) 40 MG tablet TAKE (1) TABLET BY MOUTH EVERY DAY  . nadolol (CORGARD) 20 MG tablet Take 1 tablet (20 mg total) by mouth daily.  Marland Kitchen spironolactone (ALDACTONE) 50 MG tablet Take 0.5 tablets (25 mg total) by mouth daily. am    PHQ 2/9 Scores 09/06/2019 01/18/2019 07/27/2018 07/08/2018  PHQ - 2 Score 0 0 0 0  PHQ- 9 Score 2 - 3 0    GAD 7 : Generalized Anxiety Score 09/06/2019  Nervous, Anxious, on Edge 0  Control/stop worrying 1  Worry too much - different things 1  Trouble relaxing 0  Restless 0  Easily annoyed or irritable 0  Afraid - awful might happen 0  Total GAD 7 Score 2  Anxiety  Difficulty Not difficult at all    BP Readings from Last 3 Encounters:  09/06/19 112/72  07/04/19 118/64  04/04/19 122/68    Physical Exam Vitals and nursing note reviewed.  Constitutional:      General: [name].    Appearance: She is well-developed.  HENT:     Head: Normocephalic and atraumatic.     Right Ear: Tympanic membrane and ear canal normal.     Left Ear: Tympanic membrane and ear canal normal.     Nose:     Right Sinus: No maxillary sinus tenderness.     Left Sinus: No maxillary sinus tenderness.  Eyes:     General: No scleral icterus.       Right eye: No discharge.        Left eye: No discharge.     Conjunctiva/sclera: Conjunctivae normal.  Neck:     Thyroid: No thyromegaly.     Vascular: No carotid bruit.  Cardiovascular:     Rate and  Rhythm: Normal rate and regular rhythm.     Pulses:          Radial pulses are 1+ on the right side and 1+ on the left side.     Heart sounds: Normal heart sounds.     Comments: Prominent spider veins Pulmonary:     Effort: Pulmonary effort is normal. No respiratory distress.     Breath sounds: No wheezing.  Chest:     Breasts:        Right: No mass, nipple discharge, skin change or tenderness.        Left: No mass, nipple discharge, skin change or tenderness.  Abdominal:     General: Bowel sounds are normal.     Palpations: Abdomen is soft.     Tenderness: There is no abdominal tenderness.  Musculoskeletal:        General: Tenderness present.     Cervical back: Normal range of motion. No erythema.     Right hip: Bony tenderness present. Decreased range of motion.     Left hip: Bony tenderness present. Decreased range of motion.     Right knee: Normal.     Left knee: Normal.     Right lower leg: No edema.     Left lower leg: No edema.  Lymphadenopathy:     Cervical: No cervical adenopathy.  Skin:    General: Skin is warm and dry.     Capillary Refill: Capillary refill takes less than 2 seconds.     Findings: No lesion or rash.  Neurological:     General: No focal deficit present.     Mental Status: She is alert and oriented to person, place, and time.     Cranial Nerves: No cranial nerve deficit.     Sensory: No sensory deficit.     Deep Tendon Reflexes: Reflexes are normal and symmetric.  Psychiatric:        Attention and Perception: Attention normal.        Mood and Affect: Mood normal.     Wt Readings from Last 3 Encounters:  09/06/19 161 lb (73 kg)  07/04/19 159 lb (72.1 kg)  04/04/19 162 lb (73.5 kg)    BP 112/72   Pulse 82   Temp 98.5 F (36.9 C) (Oral)   Ht 5' 2.5" (1.588 m)   Wt 161 lb (73 kg)   SpO2 95%   BMI 28.98 kg/m   Assessment and Plan: 1. Annual  physical exam Normal exam - continue diet and efforts at exercise - Lipid panel - TSH - POCT  urinalysis dipstick  2. Need for hepatitis C screening test - Hepatitis C antibody  3. Cirrhosis of liver with ascites, unspecified hepatic cirrhosis type ([place]) Due for labs and [place] Weight stable; overall doing well - Comprehensive metabolic panel - AFP tumor marker - US Abdomen Limited RUQ; Future  4. Esophageal varices in cirrhosis (HCC) Continue nadolol No bleeding noted - CBC with Differential/Platelet  5. Pre-diabetes Continue low carb diet, weight control - Hemoglobin A1c  6. Trochanteric bursitis of both hips Recommend follow up at [facility] Try Lidoderm or Salonpas patches to lateral hips  7. Spinal stenosis of lumbar region without neurogenic claudication May be contributing to gait disturbance/hip pain   Partially dictated using Editor, commissioning. Any errors are unintentional.  Halina Maidens, MD [facility]  09/06/2019

## 2019-09-07 LAB — AFP TUMOR MARKER: AFP, Serum, Tumor Marker: 4.9 ng/mL (ref 0.0–8.3)

## 2019-09-11 ENCOUNTER — Ambulatory Visit: Admission: RE | Admit: 2019-09-11 | Payer: Medicare HMO | Source: Ambulatory Visit

## 2019-09-12 ENCOUNTER — Ambulatory Visit
Admission: RE | Admit: 2019-09-12 | Discharge: 2019-09-12 | Disposition: A | Discharge status: Home / Self Care | Payer: Medicare HMO | Source: Ambulatory Visit | Attending: Internal Medicine | Admitting: Internal Medicine

## 2019-09-12 ENCOUNTER — Other Ambulatory Visit: Payer: Self-pay

## 2019-09-12 DIAGNOSIS — Z1231 Encounter for screening mammogram for malignant neoplasm of breast: Secondary | ICD-10-CM | POA: Diagnosis not present

## 2019-09-26 DIAGNOSIS — M48061 Spinal stenosis, lumbar region without neurogenic claudication: Secondary | ICD-10-CM | POA: Diagnosis not present

## 2019-09-26 DIAGNOSIS — M25551 Pain in right hip: Secondary | ICD-10-CM | POA: Diagnosis not present

## 2019-09-26 DIAGNOSIS — M4319 Spondylolisthesis, multiple sites in spine: Secondary | ICD-10-CM | POA: Diagnosis not present

## 2019-09-26 DIAGNOSIS — M47817 Spondylosis without myelopathy or radiculopathy, lumbosacral region: Secondary | ICD-10-CM | POA: Diagnosis not present

## 2019-10-02 ENCOUNTER — Ambulatory Visit: Payer: Medicare HMO

## 2019-10-30 ENCOUNTER — Ambulatory Visit: Payer: Medicare HMO

## 2019-11-01 ENCOUNTER — Telehealth: Payer: Self-pay | Admitting: Internal Medicine

## 2019-11-01 ENCOUNTER — Other Ambulatory Visit: Payer: Self-pay

## 2019-11-01 DIAGNOSIS — R188 Other ascites: Secondary | ICD-10-CM

## 2019-11-01 DIAGNOSIS — K7460 Unspecified cirrhosis of liver: Secondary | ICD-10-CM

## 2019-11-01 NOTE — Telephone Encounter (Signed)
Patient said radiology told her the order from Dr Army Melia has expired so I ordered a new one and told her someone should call her to reschedule her for this [place].   CM

## 2019-11-01 NOTE — Telephone Encounter (Unsigned)
Copied from [place] [id number]. Topic: General - Other >> Nov 01, 2019  8:51 AM Celene Kras wrote: Reason for CRM: Pt called stating that she was scheduled to have an ultrasound, but that she is in pain due to an injection she just had. She states that she was advised to contact PCP to get it rescheduled. Please advise.

## 2019-11-07 ENCOUNTER — Ambulatory Visit: Payer: Medicare HMO

## 2019-11-10 ENCOUNTER — Ambulatory Visit: Payer: Medicare HMO

## 2019-11-13 ENCOUNTER — Telehealth: Payer: Self-pay | Admitting: Internal Medicine

## 2019-11-13 NOTE — Telephone Encounter (Signed)
Called. No answer/no vm; just kept ringing. Pt due to schedule Medicare Annual Wellness Visit (AWV) either virtually/audio only or in office. Whichever the patients preference is.  Last AWV 11/16/16; please schedule at anytime with [facility] Advisor.  This should be a 40 minute visit.

## 2019-11-20 ENCOUNTER — Other Ambulatory Visit: Payer: Self-pay | Admitting: Internal Medicine

## 2019-11-20 DIAGNOSIS — K7460 Unspecified cirrhosis of liver: Secondary | ICD-10-CM

## 2019-11-20 NOTE — Telephone Encounter (Signed)
Requested Prescriptions  Pending Prescriptions Disp Refills   spironolactone (ALDACTONE) 50 MG tablet [Pharmacy Med Name: SPIRONOLACTONE 50 MG TAB] 45 tablet 1    Sig: TAKE 1/2 TABLET BY MOUTH ONCE DAILY.     Cardiovascular: Diuretics - Aldosterone Antagonist Passed - 11/20/2019 11:11 AM      Passed - Cr in normal range and within 360 days    Creatinine, Ser  Date Value Ref Range Status  09/06/2019 0.83 0.44 - 1.00 mg/dL Final         Passed - K in normal range and within 360 days    Potassium  Date Value Ref Range Status  09/06/2019 3.9 3.5 - 5.1 mmol/L Final         Passed - Na in normal range and within 360 days    Sodium  Date Value Ref Range Status  09/06/2019 139 135 - 145 mmol/L Final  05/19/2016 139 134 - 144 mmol/L Final         Passed - Last BP in normal range    BP Readings from Last 1 Encounters:  09/06/19 112/72         Passed - Valid encounter within last 6 months    Recent Outpatient Visits          2 months ago Annual physical exam   [facility] Glean Hess, MD   4 months ago Sciatica of right side   [facility] Glean Hess, MD   8 months ago [facility] Glean Hess, MD   10 months ago Gross hematuria   [facility] Glean Hess, MD   1 year ago Pre-diabetes   [facility] Glean Hess, MD      Future Appointments            In 3 months Army Melia Jesse Sans, MD [facility], [place]   In 9 months Army Melia, Jesse Sans, MD [facility], [facility]

## 2019-11-20 NOTE — Telephone Encounter (Signed)
spironolactone (ALDACTONE) 50 MG tablet Medication Date: 05/25/2019 Department: [facility] Ordering/Authorizing: Glean Hess, MD   Pt has called about this med already this am and seems quite agitated as it is her liver medication and she is out.  [place] [place] [facility] [place] Phone:  [phone]  Fax:  [phone]

## 2019-11-22 NOTE — Telephone Encounter (Signed)
Pt would not like to schedule AWV at this time.  She states she has enough going on. Offered the phone visit, and pt declined as well.

## 2019-11-28 ENCOUNTER — Ambulatory Visit
Admission: RE | Admit: 2019-11-28 | Discharge: 2019-11-28 | Disposition: A | Discharge status: Home / Self Care | Payer: Medicare HMO | Source: Ambulatory Visit | Attending: Internal Medicine | Admitting: Internal Medicine

## 2019-11-28 ENCOUNTER — Other Ambulatory Visit: Payer: Self-pay

## 2019-11-28 DIAGNOSIS — R69 Illness, unspecified: Secondary | ICD-10-CM | POA: Diagnosis not present

## 2019-11-28 DIAGNOSIS — K7689 Other specified diseases of liver: Secondary | ICD-10-CM | POA: Diagnosis not present

## 2019-11-28 DIAGNOSIS — R188 Other ascites: Secondary | ICD-10-CM | POA: Diagnosis not present

## 2019-11-28 DIAGNOSIS — K7460 Unspecified cirrhosis of liver: Secondary | ICD-10-CM | POA: Diagnosis not present

## 2019-12-29 ENCOUNTER — Telehealth: Payer: Self-pay

## 2019-12-29 NOTE — Telephone Encounter (Signed)
Copied from [place] [id number]. Topic: Referral - Request for Referral >> Dec 29, 2019  3:31 PM Gillis Ends D wrote: Has patient seen PCP for this complaint? Yes.   *If NO, is insurance requiring patient see PCP for this issue before PCP can refer them? Referral for which specialty: Physical Therapy Preferred provider/[phone] phone and [phone] fax Reason for referral: Physical Therapy prior at the same location, patient wants it done today so she could go on Monday at 1:00

## 2019-12-29 NOTE — Telephone Encounter (Signed)
Called pt told her that we would not send in a RF for PT. Pt was seeing Dr.Petal and he referred her to see Reche Dixon PA. Pt had an appt today 12/29/2019. Pts daughter cancelled the appt with Reche Dixon because she does not want to see a PA. Told pt that she needed to follow what Dr. Posey Pronto wanted her to do. Pt still refused to see PA. Pt said that she would contact her insurance so she could do PT.   KP

## 2020-01-08 ENCOUNTER — Telehealth: Payer: Self-pay

## 2020-01-08 NOTE — Telephone Encounter (Signed)
-----   Message from Lucilla Lame, MD sent at 01/04/2020 10:31 AM EST ----- She is correct that she should not take NSAIDs but she can take Tylenol as long as she takes what is written on the box and not exceed the recommended dose.  As for red meat I have told her no such thing I had told her that she should watch her iron intake and that nonred meat such as fish and chicken are better for protein and do not have as much iron.  This by no means that she can have red meat. ----- Message ----- From: Glennie Isle, CMA Sent: 01/04/2020  10:18 AM EST To: Lucilla Lame, MD  Pt would like to know what you feel she can take for pain. She has fallen, has bursitis and a recent xray showed arthritis in her back. She isn't taking anything because she has Cirrhosis. She knows she cannot take NSAIDs. She said she has also lost a lot of weight because of the cirrhosis, she can't eat certain things. She said you told her not to eat red meat. Please advise.

## 2020-01-25 DIAGNOSIS — M2569 Stiffness of other specified joint, not elsewhere classified: Secondary | ICD-10-CM | POA: Diagnosis not present

## 2020-01-25 DIAGNOSIS — M25551 Pain in right hip: Secondary | ICD-10-CM | POA: Diagnosis not present

## 2020-01-25 DIAGNOSIS — M5441 Lumbago with sciatica, right side: Secondary | ICD-10-CM | POA: Diagnosis not present

## 2020-01-25 DIAGNOSIS — M545 Low back pain, unspecified: Secondary | ICD-10-CM | POA: Diagnosis not present

## 2020-01-25 DIAGNOSIS — M7061 Trochanteric bursitis, right hip: Secondary | ICD-10-CM | POA: Diagnosis not present

## 2020-01-25 DIAGNOSIS — G8929 Other chronic pain: Secondary | ICD-10-CM | POA: Diagnosis not present

## 2020-01-30 ENCOUNTER — Other Ambulatory Visit: Payer: Self-pay | Admitting: Physician Assistant

## 2020-01-30 DIAGNOSIS — M25551 Pain in right hip: Secondary | ICD-10-CM | POA: Diagnosis not present

## 2020-01-30 DIAGNOSIS — M5441 Lumbago with sciatica, right side: Secondary | ICD-10-CM

## 2020-01-30 DIAGNOSIS — M7061 Trochanteric bursitis, right hip: Secondary | ICD-10-CM | POA: Diagnosis not present

## 2020-01-30 DIAGNOSIS — M2569 Stiffness of other specified joint, not elsewhere classified: Secondary | ICD-10-CM | POA: Diagnosis not present

## 2020-01-30 DIAGNOSIS — M4807 Spinal stenosis, lumbosacral region: Secondary | ICD-10-CM

## 2020-01-30 DIAGNOSIS — G8929 Other chronic pain: Secondary | ICD-10-CM | POA: Diagnosis not present

## 2020-01-30 DIAGNOSIS — M48062 Spinal stenosis, lumbar region with neurogenic claudication: Secondary | ICD-10-CM | POA: Diagnosis not present

## 2020-01-30 DIAGNOSIS — M545 Low back pain, unspecified: Secondary | ICD-10-CM | POA: Diagnosis not present

## 2020-01-30 DIAGNOSIS — M5442 Lumbago with sciatica, left side: Secondary | ICD-10-CM | POA: Diagnosis not present

## 2020-02-07 ENCOUNTER — Other Ambulatory Visit: Payer: Self-pay

## 2020-02-07 ENCOUNTER — Ambulatory Visit
Admission: RE | Admit: 2020-02-07 | Discharge: 2020-02-07 | Disposition: A | Discharge status: Home / Self Care | Payer: Medicare HMO | Source: Ambulatory Visit | Attending: Physician Assistant | Admitting: Physician Assistant

## 2020-02-07 DIAGNOSIS — M5441 Lumbago with sciatica, right side: Secondary | ICD-10-CM | POA: Diagnosis not present

## 2020-02-07 DIAGNOSIS — M545 Low back pain, unspecified: Secondary | ICD-10-CM | POA: Diagnosis not present

## 2020-02-07 DIAGNOSIS — M5442 Lumbago with sciatica, left side: Secondary | ICD-10-CM | POA: Diagnosis not present

## 2020-02-07 DIAGNOSIS — M48062 Spinal stenosis, lumbar region with neurogenic claudication: Secondary | ICD-10-CM | POA: Insufficient documentation

## 2020-02-07 DIAGNOSIS — M4807 Spinal stenosis, lumbosacral region: Secondary | ICD-10-CM | POA: Diagnosis not present

## 2020-02-19 DIAGNOSIS — K7460 Unspecified cirrhosis of liver: Secondary | ICD-10-CM | POA: Diagnosis not present

## 2020-02-19 DIAGNOSIS — M4807 Spinal stenosis, lumbosacral region: Secondary | ICD-10-CM | POA: Diagnosis not present

## 2020-02-19 DIAGNOSIS — N200 Calculus of kidney: Secondary | ICD-10-CM | POA: Diagnosis not present

## 2020-02-19 DIAGNOSIS — K219 Gastro-esophageal reflux disease without esophagitis: Secondary | ICD-10-CM | POA: Diagnosis not present

## 2020-02-19 DIAGNOSIS — I10 Essential (primary) hypertension: Secondary | ICD-10-CM | POA: Diagnosis not present

## 2020-02-19 DIAGNOSIS — H269 Unspecified cataract: Secondary | ICD-10-CM | POA: Diagnosis not present

## 2020-02-26 ENCOUNTER — Ambulatory Visit: Payer: Medicare HMO | Admitting: Internal Medicine

## 2020-02-27 DIAGNOSIS — K7460 Unspecified cirrhosis of liver: Secondary | ICD-10-CM | POA: Diagnosis not present

## 2020-02-27 DIAGNOSIS — H269 Unspecified cataract: Secondary | ICD-10-CM | POA: Diagnosis not present

## 2020-02-27 DIAGNOSIS — N200 Calculus of kidney: Secondary | ICD-10-CM | POA: Diagnosis not present

## 2020-02-27 DIAGNOSIS — I10 Essential (primary) hypertension: Secondary | ICD-10-CM | POA: Diagnosis not present

## 2020-02-27 DIAGNOSIS — M4807 Spinal stenosis, lumbosacral region: Secondary | ICD-10-CM | POA: Diagnosis not present

## 2020-02-27 DIAGNOSIS — K219 Gastro-esophageal reflux disease without esophagitis: Secondary | ICD-10-CM | POA: Diagnosis not present

## 2020-02-29 DIAGNOSIS — M48061 Spinal stenosis, lumbar region without neurogenic claudication: Secondary | ICD-10-CM | POA: Diagnosis not present

## 2020-02-29 DIAGNOSIS — M5441 Lumbago with sciatica, right side: Secondary | ICD-10-CM | POA: Diagnosis not present

## 2020-03-06 ENCOUNTER — Other Ambulatory Visit: Payer: Self-pay | Admitting: Internal Medicine

## 2020-03-06 DIAGNOSIS — R188 Other ascites: Secondary | ICD-10-CM

## 2020-03-06 DIAGNOSIS — K7460 Unspecified cirrhosis of liver: Secondary | ICD-10-CM

## 2020-03-06 NOTE — Telephone Encounter (Signed)
Pt called stating that she only has enough medication to last her until Saturday. She states that she is afraid of having to go out in the weather this weekend. She is requesting to have this sent over today so that she can have it mailed to her. Please advise.

## 2020-03-11 DIAGNOSIS — K219 Gastro-esophageal reflux disease without esophagitis: Secondary | ICD-10-CM | POA: Diagnosis not present

## 2020-03-11 DIAGNOSIS — N200 Calculus of kidney: Secondary | ICD-10-CM | POA: Diagnosis not present

## 2020-03-11 DIAGNOSIS — I10 Essential (primary) hypertension: Secondary | ICD-10-CM | POA: Diagnosis not present

## 2020-03-11 DIAGNOSIS — H269 Unspecified cataract: Secondary | ICD-10-CM | POA: Diagnosis not present

## 2020-03-11 DIAGNOSIS — K7460 Unspecified cirrhosis of liver: Secondary | ICD-10-CM | POA: Diagnosis not present

## 2020-03-11 DIAGNOSIS — M4807 Spinal stenosis, lumbosacral region: Secondary | ICD-10-CM | POA: Diagnosis not present

## 2020-03-18 DIAGNOSIS — M48061 Spinal stenosis, lumbar region without neurogenic claudication: Secondary | ICD-10-CM | POA: Diagnosis not present

## 2020-03-18 DIAGNOSIS — M5441 Lumbago with sciatica, right side: Secondary | ICD-10-CM | POA: Diagnosis not present

## 2020-03-18 DIAGNOSIS — G8929 Other chronic pain: Secondary | ICD-10-CM | POA: Diagnosis not present

## 2020-03-19 DIAGNOSIS — H269 Unspecified cataract: Secondary | ICD-10-CM | POA: Diagnosis not present

## 2020-03-19 DIAGNOSIS — N200 Calculus of kidney: Secondary | ICD-10-CM | POA: Diagnosis not present

## 2020-03-19 DIAGNOSIS — M4807 Spinal stenosis, lumbosacral region: Secondary | ICD-10-CM | POA: Diagnosis not present

## 2020-03-19 DIAGNOSIS — K219 Gastro-esophageal reflux disease without esophagitis: Secondary | ICD-10-CM | POA: Diagnosis not present

## 2020-03-19 DIAGNOSIS — K7460 Unspecified cirrhosis of liver: Secondary | ICD-10-CM | POA: Diagnosis not present

## 2020-03-19 DIAGNOSIS — I10 Essential (primary) hypertension: Secondary | ICD-10-CM | POA: Diagnosis not present

## 2020-03-20 ENCOUNTER — Ambulatory Visit (INDEPENDENT_AMBULATORY_CARE_PROVIDER_SITE_OTHER): Payer: Medicare HMO

## 2020-03-20 DIAGNOSIS — Z0000 Encounter for general adult medical examination without abnormal findings: Secondary | ICD-10-CM | POA: Diagnosis not present

## 2020-03-20 NOTE — Patient Instructions (Signed)
Jenna Franklin , Thank you for taking time to come for your Medicare Wellness Visit. I appreciate your ongoing commitment to your health goals. Please review the following plan we discussed and let me know if I can assist you in the future.   Screening recommendations/referrals: Colonoscopy: no longer required Mammogram: done 09/12/19 Bone Density: done 02/21/13 Recommended yearly ophthalmology/optometry visit for glaucoma screening and checkup Recommended yearly dental visit for hygiene and checkup  Vaccinations: Influenza vaccine: we will contact [facility] for your flu vaccine information Pneumococcal vaccine: done 03/01/18 Tdap vaccine: done 01/11/17 Shingles vaccine: Shingrix discussed. Please contact your pharmacy for coverage information.  Covid-19: done 04/22/19, 05/20/19; we will contact [facility] for your booster vaccine information  Conditions/risks identified: Recommend fall prevention in the home by continuing strength and gait exercises with physical therapy   Next appointment: Follow up in one year for your annual wellness visit    Preventive Care 65 Years and Older, Female Preventive care refers to lifestyle choices and visits with your health care provider that can promote health and wellness. What does preventive care include?  A yearly physical exam. This is also called an annual well check.  Dental exams once or twice a year.  Routine eye exams. Ask your health care provider how often you should have your eyes checked.  Personal lifestyle choices, including:  Daily care of your teeth and gums.  Regular physical activity.  Eating a healthy diet.  Avoiding tobacco and drug use.  Limiting alcohol use.  Practicing safe sex.  Taking low-dose aspirin every day.  Taking vitamin and mineral supplements as recommended by your health care provider. What happens during an annual well check? The services and screenings done by your health care provider during your  annual well check will depend on your age, overall health, lifestyle risk factors, and family history of disease. Counseling  Your health care provider may ask you questions about your:  Alcohol use.  Tobacco use.  Drug use.  Emotional well-being.  Home and relationship well-being.  Sexual activity.  Eating habits.  History of falls.  Memory and ability to understand (cognition).  Work and work Statistician.  Reproductive health. Screening  You may have the following tests or measurements:  Height, weight, and BMI.  Blood pressure.  Lipid and cholesterol levels. These may be checked every 5 years, or more frequently if you are over 83 years old.  Skin check.  Lung cancer screening. You may have this screening every year starting at age 28 if you have a 30-pack-year history of smoking and currently smoke or have quit within the past 15 years.  Fecal occult blood test (FOBT) of the stool. You may have this test every year starting at age 38.  Flexible sigmoidoscopy or colonoscopy. You may have a sigmoidoscopy every 5 years or a colonoscopy every 10 years starting at age 69.  Hepatitis C blood test.  Hepatitis B blood test.  Sexually transmitted disease (STD) testing.  Diabetes screening. This is done by checking your blood sugar (glucose) after you have not eaten for a while (fasting). You may have this done every 1-3 years.  Bone density scan. This is done to screen for osteoporosis. You may have this done starting at age 58.  Mammogram. This may be done every 1-2 years. Talk to your health care provider about how often you should have regular mammograms. Talk with your health care provider about your test results, treatment options, and if necessary, the need for more  tests. Vaccines  Your health care provider may recommend certain vaccines, such as:  Influenza vaccine. This is recommended every year.  Tetanus, diphtheria, and acellular pertussis (Tdap, Td)  vaccine. You may need a Td booster every 10 years.  Zoster vaccine. You may need this after age 37.  Pneumococcal 13-valent conjugate (PCV13) vaccine. One dose is recommended after age 49.  Pneumococcal polysaccharide (PPSV23) vaccine. One dose is recommended after age 50. Talk to your health care provider about which screenings and vaccines you need and how often you need them. This information is not intended to replace advice given to you by your health care provider. Make sure you discuss any questions you have with your health care provider. Document Released: 03/01/2015 Document Revised: 10/23/2015 Document Reviewed: 12/04/2014 Elsevier Interactive Patient Education  2017 [place] Prevention in the Home Falls can cause injuries. They can happen to people of all ages. There are many things you can do to make your home safe and to help prevent falls. What can I do on the outside of my home?  Regularly fix the edges of walkways and driveways and fix any cracks.  Remove anything that might make you trip as you walk through a door, such as a raised step or threshold.  Trim any bushes or trees on the path to your home.  Use bright outdoor lighting.  Clear any walking paths of anything that might make someone trip, such as rocks or tools.  Regularly check to see if handrails are loose or broken. Make sure that both sides of any steps have handrails.  Any raised decks and porches should have guardrails on the edges.  Have any leaves, snow, or ice cleared regularly.  Use sand or salt on walking paths during winter.  Clean up any spills in your garage right away. This includes oil or grease spills. What can I do in the bathroom?  Use night lights.  Install grab bars by the toilet and in the tub and shower. Do not use towel bars as grab bars.  Use non-skid mats or decals in the tub or shower.  If you need to sit down in the shower, use a plastic, non-slip  stool.  Keep the floor dry. Clean up any water that spills on the floor as soon as it happens.  Remove soap buildup in the tub or shower regularly.  Attach bath mats securely with double-sided non-slip rug tape.  Do not have throw rugs and other things on the floor that can make you trip. What can I do in the bedroom?  Use night lights.  Make sure that you have a light by your bed that is easy to reach.  Do not use any sheets or blankets that are too big for your bed. They should not hang down onto the floor.  Have a firm chair that has side arms. You can use this for support while you get dressed.  Do not have throw rugs and other things on the floor that can make you trip. What can I do in the kitchen?  Clean up any spills right away.  Avoid walking on wet floors.  Keep items that you use a lot in easy-to-reach places.  If you need to reach something above you, use a strong step stool that has a grab bar.  Keep electrical cords out of the way.  Do not use floor polish or wax that makes floors slippery. If you must use wax, use non-skid floor  wax.  Do not have throw rugs and other things on the floor that can make you trip. What can I do with my stairs?  Do not leave any items on the stairs.  Make sure that there are handrails on both sides of the stairs and use them. Fix handrails that are broken or loose. Make sure that handrails are as long as the stairways.  Check any carpeting to make sure that it is firmly attached to the stairs. Fix any carpet that is loose or worn.  Avoid having throw rugs at the top or bottom of the stairs. If you do have throw rugs, attach them to the floor with carpet tape.  Make sure that you have a light switch at the top of the stairs and the bottom of the stairs. If you do not have them, ask someone to add them for you. What else can I do to help prevent falls?  Wear shoes that:  Do not have high heels.  Have rubber bottoms.  Are  comfortable and fit you well.  Are closed at the toe. Do not wear sandals.  If you use a stepladder:  Make sure that it is fully opened. Do not climb a closed stepladder.  Make sure that both sides of the stepladder are locked into place.  Ask someone to hold it for you, if possible.  Clearly mark and make sure that you can see:  Any grab bars or handrails.  First and last steps.  Where the edge of each step is.  Use tools that help you move around (mobility aids) if they are needed. These include:  Canes.  Walkers.  Scooters.  Crutches.  Turn on the lights when you go into a dark area. Replace any light bulbs as soon as they burn out.  Set up your furniture so you have a clear path. Avoid moving your furniture around.  If any of your floors are uneven, fix them.  If there are any pets around you, be aware of where they are.  Review your medicines with your doctor. Some medicines can make you feel dizzy. This can increase your chance of falling. Ask your doctor what other things that you can do to help prevent falls. This information is not intended to replace advice given to you by your health care provider. Make sure you discuss any questions you have with your health care provider. Document Released: 11/29/2008 Document Revised: 07/11/2015 Document Reviewed: 03/09/2014 Elsevier Interactive Patient Education  2017 [employer].

## 2020-03-20 NOTE — Progress Notes (Signed)
Subjective:   Jenna Franklin is a 80 y.o. female who presents for Medicare Annual (Subsequent) preventive examination.  Virtual Visit via Telephone Note  I connected with  Jenna Franklin on 03/20/20 at  2:00 PM EST by telephone and verified that I am speaking with the correct person using two identifiers.  Location: Patient: home Provider: [facility] Persons participating in the virtual visit: [place]   I discussed the limitations, risks, security and privacy concerns of performing an evaluation and management service by telephone and the availability of in person appointments. The patient expressed understanding and agreed to proceed.  Interactive audio and video telecommunications were attempted between this nurse and patient, however failed, due to patient having technical difficulties OR patient did not have access to video capability.  We continued and completed visit with audio only.  Some vital signs may be absent or patient reported.   Clemetine Marker, LPN    Review of Systems     Cardiac Risk Factors include: advanced age (>47men, >16 women);sedentary lifestyle;hypertension     Objective:    Today's Vitals   03/20/20 1411  PainSc: 8    There is no height or weight on file to calculate BMI.  Advanced Directives 01/30/2019 01/09/2019 06/17/2017 11/16/2016 05/16/2016 06/17/2015 06/13/2015  Does Patient Have a Medical Advance Directive? Yes Yes Yes Yes Yes Yes No  Type of Paramedic of [place];Living will [place];Living will [place];Living will [place];Living will [place];Living will [place];Living will -  Does patient want to make changes to medical advance directive? No - Patient declined No - Patient declined - - - - -  Copy of [place] in Chart? No - copy requested No - copy requested No - copy requested No  - copy requested No - copy requested Yes -  Would patient like information on creating a medical advance directive? - - - - - - No - patient declined information    Current Medications (verified) Outpatient Encounter Medications as of 03/20/2020  Medication Sig  . fexofenadine (ALLEGRA) 180 MG tablet Take 180 mg by mouth daily.  . fluticasone (FLONASE) 50 MCG/ACT nasal spray Place 2 sprays into both nostrils daily.  . furosemide (LASIX) 40 MG tablet TAKE (1) TABLET BY MOUTH EVERY DAY  . nadolol (CORGARD) 20 MG tablet TAKE (1) TABLET BY MOUTH EVERY DAY  . spironolactone (ALDACTONE) 50 MG tablet TAKE 1/2 TABLET BY MOUTH ONCE DAILY.  . [DISCONTINUED] acetaminophen (TYLENOL) 500 MG tablet Take 500 mg by mouth every 6 (six) hours as needed.  . [DISCONTINUED] cyclobenzaprine (FLEXERIL) 10 MG tablet Take 0.5-1 tablets (5-10 mg total) by mouth at bedtime.   No facility-administered encounter medications on file as of 03/20/2020.    Allergies (verified) Baclofen, Codeine, Sulfa antibiotics, and Prednisone   History: Past Medical History:  Diagnosis Date  . Anxiety   . Arthritis    back  . Bronchitis    currently  . CHF (congestive heart failure) ([place])    pt says she thinks she has this  . Cirrhosis of liver ([place]) 2016  . Depression   . Esophageal varices ([place])   . Esophageal varices ([place])   . Gastritis without bleeding 2019  . GERD (gastroesophageal reflux disease)   . H/O: depression   . History of kidney stones   . Hypertension   . Nerve root pain 12/11/2011  . Non-alcoholic fatty  liver disease   . Nonalcoholic fatty liver disease   . Osteopenia   . Osteopenia   . Reflux    in past  . Spinal stenosis   . Tachycardia 03/25/2015  . Vertigo    3 yrs ago  . Vitamin D deficiency   . Vitamin D deficiency    Past Surgical History:  Procedure Laterality Date  . ABDOMINAL HYSTERECTOMY    . APPENDECTOMY    . BREAST BIOPSY Right    neg needle bx  . CATARACT EXTRACTION W/PHACO  Right 01/09/2019   Procedure: CATARACT EXTRACTION PHACO AND INTRAOCULAR LENS PLACEMENT (IOC) RIGHT 8.60,        00:53.3;  Surgeon: Eulogio Bear, MD;  Location: [place];  Service: Ophthalmology;  Laterality: Right;  . CATARACT EXTRACTION W/PHACO Left 01/30/2019   Procedure: CATARACT EXTRACTION PHACO AND INTRAOCULAR LENS PLACEMENT (IOC) LEFT, 2.59, 00:25.5;  Surgeon: Eulogio Bear, MD;  Location: [place];  Service: Ophthalmology;  Laterality: Left;  . COMBINED HYSTERECTOMY ABDOMINAL W/ A&P REPAIR / OOPHORECTOMY    . ESOPHAGOGASTRODUODENOSCOPY (EGD) WITH PROPOFOL N/A 06/17/2015   Procedure: ESOPHAGOGASTRODUODENOSCOPY (EGD) WITH PROPOFOL;  Surgeon: Lucilla Lame, MD;  Location: [place];  Service: Endoscopy;  Laterality: N/A;  . ESOPHAGOGASTRODUODENOSCOPY (EGD) WITH PROPOFOL N/A 06/17/2017   Procedure: ESOPHAGOGASTRODUODENOSCOPY (EGD) WITH PROPOFOL;  Surgeon: Lucilla Lame, MD;  Location: [place];  Service: Endoscopy;  Laterality: N/A;  . LAPAROSCOPIC CHOLECYSTECTOMY    . TONSILLECTOMY     Family History  Problem Relation Age of Onset  . Heart disease Mother   . Heart disease Father   . Heart attack Paternal Grandfather   . Asthma Son 41  . Hypertension Daughter   . Breast cancer Maternal Aunt    Social History   Socioeconomic History  . Marital status: Widowed    Spouse name: Not on file  . Number of children: Not on file  . Years of education: Not on file  . Highest education level: Not on file  Occupational History  . Not on file  Tobacco Use  . Smoking status: Never Smoker  . Smokeless tobacco: Never Used  . Tobacco comment: smoking cessation materials not required  Vaping Use  . Vaping Use: Never used  Substance and Sexual Activity  . Alcohol use: No    Alcohol/week: 0.0 standard drinks  . Drug use: No  . Sexual activity: Yes    Birth control/protection: Post-menopausal  Other Topics Concern  . Not on file  Social  History Narrative  . Not on file   Social Determinants of Health   Financial Resource Strain: Low Risk   . Difficulty of Paying Living Expenses: Not hard at all  Food Insecurity: No Food Insecurity  . Worried About Charity fundraiser in the Last Year: Never true  . Ran Out of Food in the Last Year: Never true  Transportation Needs: No Transportation Needs  . Lack of Transportation (Medical): No  . Lack of Transportation (Non-Medical): No  Physical Activity: Inactive  . Days of Exercise per Week: 0 days  . Minutes of Exercise per Session: 0 min  Stress: No Stress Concern Present  . Feeling of Stress : Only a little  Social Connections: Socially Isolated  . Frequency of Communication with Friends and Family: More than three times a week  . Frequency of Social Gatherings with Friends and Family: Once a week  . Attends Religious Services: Never  . Active Member of Clubs or  Organizations: No  . Attends Archivist Meetings: Never  . Marital Status: Widowed    Tobacco Counseling Counseling given: Not Answered Comment: smoking cessation materials not required   Clinical Intake:  Pre-visit preparation completed: Yes  Pain : 0-10 Pain Score: 8  Pain Type: Chronic pain Pain Location: Back Pain Orientation: Medial Pain Descriptors / Indicators: Aching,Sore Pain Onset: More than a month ago Pain Frequency: Constant     Nutritional Risks: None Diabetes: No  How often do you need to have someone help you when you read instructions, pamphlets, or other written materials from your doctor or pharmacy?: 1 - Never    Interpreter Needed?: No  Information entered by :: Clemetine Marker LPN   Activities of Daily Living In your present state of health, do you have any difficulty performing the following activities: 03/20/2020  Hearing? N  Comment declines hearing aids  Vision? N  Difficulty concentrating or making decisions? N  Walking or climbing stairs? Y  Dressing or  bathing? N  Doing errands, shopping? Y  Comment does not Physiological scientist and eating ? N  Using the Toilet? N  In the past six months, have you accidently leaked urine? N  Do you have problems with loss of bowel control? N  Managing your Medications? N  Managing your Finances? N  Housekeeping or managing your Housekeeping? N  Some recent data might be hidden    Patient Care Team: Glean Hess, MD as PCP - General (Internal Medicine) Lucilla Lame, MD as Consulting Physician (Gastroenterology) Thornton Park, MD as Referring Physician (Orthopedic Surgery) Margaretha Sheffield, MD as Consulting Physician (Otolaryngology) Genia Del, MD as Referring Physician (Orthopedic Surgery) Eulogio Bear, MD as Consulting Physician (Ophthalmology)  Indicate any recent Medical Services you may have received from other than [facility] providers in the past year (date may be approximate).     Assessment:   This is a routine wellness examination for [place].  Hearing/Vision screen  Hearing Screening   125Hz  250Hz  500Hz  1000Hz  2000Hz  3000Hz  4000Hz  6000Hz  8000Hz   Right ear:           Left ear:           Comments: Pt denies hearing difficulty  Vision Screening Comments: Annual vision screenings done by Dr. Edison Pace [facility]  Dietary issues and exercise activities discussed: Current Exercise Habits: The patient does not participate in regular exercise at present, Exercise limited by: orthopedic condition(s)  Goals    . Increase water intake     Recommend to increase fluid intake to 4-6 glasses every day      Depression Screen PHQ 2/9 Scores 03/20/2020 09/06/2019 01/18/2019 07/27/2018 07/08/2018 04/01/2018 11/19/2017  PHQ - 2 Score 0 0 0 0 0 1 0  PHQ- 9 Score - 2 - 3 0 - -    Fall Risk Fall Risk  03/20/2020 09/06/2019 07/04/2019 07/27/2018 07/08/2018  Falls in the past year? 1 1 1 1 1   Number falls in past yr: 1 0 1 1 1   Injury with Fall? 1 1 1 1 1   Risk for fall due to : History of  fall(s);Impaired balance/gait;Impaired mobility - Impaired balance/gait;History of fall(s) History of fall(s);Impaired balance/gait;Impaired mobility -  Risk for fall due to: Comment - - - - -  Follow up Falls prevention discussed Falls evaluation completed Falls evaluation completed - -    FALL RISK PREVENTION PERTAINING TO THE HOME:  Any stairs in or around the home? Yes  If  so, are there any without handrails? No  Home free of loose throw rugs in walkways, pet beds, electrical cords, etc? Yes  Adequate lighting in your home to reduce risk of falls? Yes   ASSISTIVE DEVICES UTILIZED TO PREVENT FALLS:  Life alert? Yes  Use of a cane, walker or w/c? Yes  Grab bars in the bathroom? Yes  Shower chair or bench in shower? No  Elevated toilet seat or a handicapped toilet? No   TIMED UP AND GO:  Was the test performed? No . Telephonic visit.  Cognitive Function: Normal cognitive status assessed by direct observation by this Nurse Health Advisor. No abnormalities found.       6CIT Screen 11/16/2016  What Year? 0 points  What month? 0 points  What time? 0 points  Count back from 20 0 points  Months in reverse 0 points  Repeat phrase 0 points  Total Score 0    Immunizations Immunization History  Administered Date(s) Administered  . Influenza, High Dose Seasonal PF 11/16/2016  . Influenza,inj,Quad PF,6+ Mos 02/18/2016  . Influenza,inj,quad, With Preservative 12/21/2017, 11/23/2018  . Moderna Sars-Covid-2 Vaccination 04/22/2019, 05/20/2019  . Pneumococcal Conjugate-13 11/16/2016  . Pneumococcal Polysaccharide-23 03/01/2018  . Tdap 01/11/2017    TDAP status: Up to date  Flu Vaccine status: Up to date  Pneumococcal vaccine status: Up to date  Covid-19 vaccine status: Completed vaccines  Qualifies for Shingles Vaccine? Yes   Zostavax completed No   Shingrix Completed?: No.    Education has been provided regarding the importance of this vaccine. Patient has been advised to  call insurance company to determine out of pocket expense if they have not yet received this vaccine. Advised may also receive vaccine at local pharmacy or Health Dept. Verbalized acceptance and understanding.  Screening Tests Health Maintenance  Topic Date Due  . INFLUENZA VACCINE  09/17/2019  . COVID-19 Vaccine (3 - Booster for Moderna series) 11/19/2019  . TETANUS/TDAP  01/12/2027  . DEXA SCAN  Completed  . Hepatitis C Screening  Completed  . PNA vac Low Risk Adult  Completed    Health Maintenance  Health Maintenance Due  Topic Date Due  . INFLUENZA VACCINE  09/17/2019  . COVID-19 Vaccine (3 - Booster for Moderna series) 11/19/2019    Colorectal cancer screening: No longer required.   Mammogram status: Completed 09/12/19. Repeat every year  Bone Density status: Completed 02/21/13. Results reflect: Bone density results: OSTEOPENIA. Repeat every 2 years. pt declines repeat screening at this time.   Lung Cancer Screening: (Low Dose CT Chest recommended if Age 79-80 years, 30 pack-year currently smoking OR have quit w/in 15years.) does not qualify.   Additional Screening:  Hepatitis C Screening: does qualify; Completed 09/06/19  Vision Screening: Recommended annual ophthalmology exams for early detection of glaucoma and other disorders of the eye. Is the patient up to date with their annual eye exam?  Yes  Who is the provider or what is the name of the office in which the patient attends annual eye exams? [facility] Screening: Recommended annual dental exams for proper oral hygiene  Community Resource Referral / Chronic Care Management: CRR required this visit?  No   CCM required this visit?  No      Plan:     I have personally reviewed and noted the following in the patient's chart:   . Medical and social history . Use of alcohol, tobacco or illicit drugs  . Current medications and supplements . Functional  ability and status . Nutritional  status . Physical activity . Advanced directives . List of other physicians . Hospitalizations, surgeries, and ER visits in previous 12 months . Vitals . Screenings to include cognitive, depression, and falls . Referrals and appointments  In addition, I have reviewed and discussed with patient certain preventive protocols, quality metrics, and best practice recommendations. A written personalized care plan for preventive services as well as general preventive health recommendations were provided to patient.     Clemetine Marker, LPN   [phone]   Nurse Notes: none

## 2020-03-21 DIAGNOSIS — N200 Calculus of kidney: Secondary | ICD-10-CM | POA: Diagnosis not present

## 2020-03-21 DIAGNOSIS — M4807 Spinal stenosis, lumbosacral region: Secondary | ICD-10-CM | POA: Diagnosis not present

## 2020-03-21 DIAGNOSIS — I10 Essential (primary) hypertension: Secondary | ICD-10-CM | POA: Diagnosis not present

## 2020-03-21 DIAGNOSIS — K219 Gastro-esophageal reflux disease without esophagitis: Secondary | ICD-10-CM | POA: Diagnosis not present

## 2020-03-21 DIAGNOSIS — H269 Unspecified cataract: Secondary | ICD-10-CM | POA: Diagnosis not present

## 2020-03-21 DIAGNOSIS — K7460 Unspecified cirrhosis of liver: Secondary | ICD-10-CM | POA: Diagnosis not present

## 2020-03-25 DIAGNOSIS — M48061 Spinal stenosis, lumbar region without neurogenic claudication: Secondary | ICD-10-CM | POA: Diagnosis not present

## 2020-03-25 DIAGNOSIS — M5441 Lumbago with sciatica, right side: Secondary | ICD-10-CM | POA: Diagnosis not present

## 2020-03-26 ENCOUNTER — Telehealth: Payer: Self-pay | Admitting: Gastroenterology

## 2020-03-26 NOTE — Telephone Encounter (Signed)
Patient has cirrhosis and cannot take any anti-inflammatory medications just analgesic medication and Tylenol is all she can take.

## 2020-03-26 NOTE — Telephone Encounter (Signed)
Patient needs to know what she can take for pain

## 2020-03-26 NOTE — Telephone Encounter (Signed)
Pt has cirrhosis. I think we have told her she can take Tylenol in the past. Just making sure there is nothing else she can take.

## 2020-03-27 NOTE — Telephone Encounter (Signed)
Advised pt per Dr Allen Norris, pt can only take Tylenol as directed on the bottle. Pt has been advised not to exceed the daily recommended dosage. Pt verbalized understanding of these instructions.

## 2020-04-02 DIAGNOSIS — M4807 Spinal stenosis, lumbosacral region: Secondary | ICD-10-CM | POA: Diagnosis not present

## 2020-04-02 DIAGNOSIS — I10 Essential (primary) hypertension: Secondary | ICD-10-CM | POA: Diagnosis not present

## 2020-04-02 DIAGNOSIS — K7460 Unspecified cirrhosis of liver: Secondary | ICD-10-CM | POA: Diagnosis not present

## 2020-04-02 DIAGNOSIS — K219 Gastro-esophageal reflux disease without esophagitis: Secondary | ICD-10-CM | POA: Diagnosis not present

## 2020-04-02 DIAGNOSIS — H269 Unspecified cataract: Secondary | ICD-10-CM | POA: Diagnosis not present

## 2020-04-02 DIAGNOSIS — N200 Calculus of kidney: Secondary | ICD-10-CM | POA: Diagnosis not present

## 2020-04-04 DIAGNOSIS — M4807 Spinal stenosis, lumbosacral region: Secondary | ICD-10-CM | POA: Diagnosis not present

## 2020-04-04 DIAGNOSIS — N200 Calculus of kidney: Secondary | ICD-10-CM | POA: Diagnosis not present

## 2020-04-04 DIAGNOSIS — K219 Gastro-esophageal reflux disease without esophagitis: Secondary | ICD-10-CM | POA: Diagnosis not present

## 2020-04-04 DIAGNOSIS — I10 Essential (primary) hypertension: Secondary | ICD-10-CM | POA: Diagnosis not present

## 2020-04-04 DIAGNOSIS — K7460 Unspecified cirrhosis of liver: Secondary | ICD-10-CM | POA: Diagnosis not present

## 2020-04-04 DIAGNOSIS — H269 Unspecified cataract: Secondary | ICD-10-CM | POA: Diagnosis not present

## 2020-04-08 DIAGNOSIS — G8929 Other chronic pain: Secondary | ICD-10-CM | POA: Diagnosis not present

## 2020-04-08 DIAGNOSIS — M7061 Trochanteric bursitis, right hip: Secondary | ICD-10-CM | POA: Diagnosis not present

## 2020-04-08 DIAGNOSIS — M48061 Spinal stenosis, lumbar region without neurogenic claudication: Secondary | ICD-10-CM | POA: Diagnosis not present

## 2020-04-08 DIAGNOSIS — M5441 Lumbago with sciatica, right side: Secondary | ICD-10-CM | POA: Diagnosis not present

## 2020-04-09 ENCOUNTER — Other Ambulatory Visit: Payer: Self-pay | Admitting: Internal Medicine

## 2020-04-09 DIAGNOSIS — K7460 Unspecified cirrhosis of liver: Secondary | ICD-10-CM

## 2020-04-09 DIAGNOSIS — I10 Essential (primary) hypertension: Secondary | ICD-10-CM | POA: Diagnosis not present

## 2020-04-09 DIAGNOSIS — M4807 Spinal stenosis, lumbosacral region: Secondary | ICD-10-CM | POA: Diagnosis not present

## 2020-04-09 DIAGNOSIS — H269 Unspecified cataract: Secondary | ICD-10-CM | POA: Diagnosis not present

## 2020-04-09 DIAGNOSIS — N200 Calculus of kidney: Secondary | ICD-10-CM | POA: Diagnosis not present

## 2020-04-09 DIAGNOSIS — K219 Gastro-esophageal reflux disease without esophagitis: Secondary | ICD-10-CM | POA: Diagnosis not present

## 2020-04-11 DIAGNOSIS — I10 Essential (primary) hypertension: Secondary | ICD-10-CM | POA: Diagnosis not present

## 2020-04-11 DIAGNOSIS — N200 Calculus of kidney: Secondary | ICD-10-CM | POA: Diagnosis not present

## 2020-04-11 DIAGNOSIS — H269 Unspecified cataract: Secondary | ICD-10-CM | POA: Diagnosis not present

## 2020-04-11 DIAGNOSIS — K7460 Unspecified cirrhosis of liver: Secondary | ICD-10-CM | POA: Diagnosis not present

## 2020-04-11 DIAGNOSIS — M4807 Spinal stenosis, lumbosacral region: Secondary | ICD-10-CM | POA: Diagnosis not present

## 2020-04-11 DIAGNOSIS — K219 Gastro-esophageal reflux disease without esophagitis: Secondary | ICD-10-CM | POA: Diagnosis not present

## 2020-04-12 ENCOUNTER — Telehealth: Payer: Self-pay | Admitting: Internal Medicine

## 2020-04-12 NOTE — Telephone Encounter (Signed)
Pt is calling to speaking to [facility] to ask Dr. Army Melia why was her furosemide (LASIX) 40 MG [[id number]]  Reduced from 90 tablets to 30 tablets. Pt is aware that an appt is needed prior to more refills. Pt states that she is unable to come to the office that she is on a walker and is unable to walk after two surgeries and fall. Please advise CB- [phone]

## 2020-04-16 DIAGNOSIS — K7460 Unspecified cirrhosis of liver: Secondary | ICD-10-CM | POA: Diagnosis not present

## 2020-04-16 DIAGNOSIS — N200 Calculus of kidney: Secondary | ICD-10-CM | POA: Diagnosis not present

## 2020-04-16 DIAGNOSIS — I10 Essential (primary) hypertension: Secondary | ICD-10-CM | POA: Diagnosis not present

## 2020-04-16 DIAGNOSIS — K219 Gastro-esophageal reflux disease without esophagitis: Secondary | ICD-10-CM | POA: Diagnosis not present

## 2020-04-16 DIAGNOSIS — M4807 Spinal stenosis, lumbosacral region: Secondary | ICD-10-CM | POA: Diagnosis not present

## 2020-04-16 DIAGNOSIS — H269 Unspecified cataract: Secondary | ICD-10-CM | POA: Diagnosis not present

## 2020-04-18 DIAGNOSIS — K7460 Unspecified cirrhosis of liver: Secondary | ICD-10-CM | POA: Diagnosis not present

## 2020-04-18 DIAGNOSIS — H269 Unspecified cataract: Secondary | ICD-10-CM | POA: Diagnosis not present

## 2020-04-18 DIAGNOSIS — M4807 Spinal stenosis, lumbosacral region: Secondary | ICD-10-CM | POA: Diagnosis not present

## 2020-04-18 DIAGNOSIS — N200 Calculus of kidney: Secondary | ICD-10-CM | POA: Diagnosis not present

## 2020-04-18 DIAGNOSIS — K219 Gastro-esophageal reflux disease without esophagitis: Secondary | ICD-10-CM | POA: Diagnosis not present

## 2020-04-18 DIAGNOSIS — I10 Essential (primary) hypertension: Secondary | ICD-10-CM | POA: Diagnosis not present

## 2020-04-23 DIAGNOSIS — I10 Essential (primary) hypertension: Secondary | ICD-10-CM | POA: Diagnosis not present

## 2020-04-23 DIAGNOSIS — N200 Calculus of kidney: Secondary | ICD-10-CM | POA: Diagnosis not present

## 2020-04-23 DIAGNOSIS — H269 Unspecified cataract: Secondary | ICD-10-CM | POA: Diagnosis not present

## 2020-04-23 DIAGNOSIS — K219 Gastro-esophageal reflux disease without esophagitis: Secondary | ICD-10-CM | POA: Diagnosis not present

## 2020-04-23 DIAGNOSIS — M4807 Spinal stenosis, lumbosacral region: Secondary | ICD-10-CM | POA: Diagnosis not present

## 2020-04-23 DIAGNOSIS — K7460 Unspecified cirrhosis of liver: Secondary | ICD-10-CM | POA: Diagnosis not present

## 2020-04-29 ENCOUNTER — Other Ambulatory Visit: Payer: Self-pay

## 2020-04-29 ENCOUNTER — Encounter: Payer: Self-pay | Admitting: Gastroenterology

## 2020-04-29 ENCOUNTER — Ambulatory Visit: Payer: Medicare HMO | Admitting: Gastroenterology

## 2020-04-29 DIAGNOSIS — R188 Other ascites: Secondary | ICD-10-CM

## 2020-04-29 DIAGNOSIS — K7460 Unspecified cirrhosis of liver: Secondary | ICD-10-CM | POA: Diagnosis not present

## 2020-04-29 MED ORDER — NADOLOL 20 MG PO TABS: ORAL_TABLET | ORAL | 3 refills | Status: DC

## 2020-04-29 MED ORDER — FUROSEMIDE 40 MG PO TABS: ORAL_TABLET | ORAL | 6 refills | Status: DC

## 2020-04-29 NOTE — Progress Notes (Signed)
Primary Care Physician: Glean Hess, MD  Primary Gastroenterologist:  Dr. Lucilla Lame  Chief Complaint  Patient presents with  . Cirrhosis    Follow up    HPI: Jenna Franklin is a 80 y.o. female here for follow-up with a history of cirrhosis.  The patient's last right upper quadrant ultrasound was in October 2021.  The patient had contacted my office with questions about pain medication because the patient wanted to know what she could take safely.  The patient was recommended to take Tylenol at the doses prescribed on the bottle and not to take NSAIDs due to her history of cirrhosis and risk for hepatorenal syndrome. The patient reports that she has lost weight by dieting and avoiding red meat.  The patient also reports that she has not had any further swelling of her extremities and would like to try decrease her diuretics since she is up to the bathroom at night frequently.  The patient also takes now to wall for her primary prophylaxis of esophageal varices.  There is no report of any black stools or bloody stools.  She does report being tired all the time because of getting up multiple times to urinate in the middle the night.  Past Medical History:  Diagnosis Date  . Anxiety   . Arthritis    back  . Bronchitis    currently  . CHF (congestive heart failure) ([place])    pt says she thinks she has this  . Cirrhosis of liver ([place]) 2016  . Depression   . Esophageal varices ([place])   . Esophageal varices ([place])   . Gastritis without bleeding 2019  . GERD (gastroesophageal reflux disease)   . H/O: depression   . History of kidney stones   . Hypertension   . Nerve root pain 12/11/2011  . Non-alcoholic fatty liver disease   . Nonalcoholic fatty liver disease   . Osteopenia   . Osteopenia   . Reflux    in past  . Spinal stenosis   . Tachycardia 03/25/2015  . Vertigo    3 yrs ago  . Vitamin D deficiency   . Vitamin D deficiency     Current Outpatient Medications   Medication Sig Dispense Refill  . fexofenadine (ALLEGRA) 180 MG tablet Take 180 mg by mouth daily.    . fluticasone (FLONASE) 50 MCG/ACT nasal spray Place 2 sprays into both nostrils daily. 16 g 2  . spironolactone (ALDACTONE) 50 MG tablet TAKE 1/2 TABLET BY MOUTH ONCE DAILY. 45 tablet 1  . furosemide (LASIX) 40 MG tablet TAKE (1) TABLET BY MOUTH EVERY DAY 30 tablet 6  . nadolol (CORGARD) 20 MG tablet TAKE (1) TABLET BY MOUTH EVERY DAY 90 tablet 3   No current facility-administered medications for this visit.    Allergies as of 04/29/2020 - Review Complete 04/29/2020  Allergen Reaction Noted  . Baclofen Anxiety 02/18/2016  . Codeine Nausea And Vomiting 09/28/2013  . Sulfa antibiotics Nausea Only 01/19/2019  . Prednisone Anxiety 11/16/2016    ROS:  General: Negative for anorexia, weight loss, fever, chills, fatigue, weakness. ENT: Negative for hoarseness, difficulty swallowing , nasal congestion. CV: Negative for chest pain, angina, palpitations, dyspnea on exertion, peripheral edema.  Respiratory: Negative for dyspnea at rest, dyspnea on exertion, cough, sputum, wheezing.  GI: See history of present illness. GU:  Negative for dysuria, hematuria, urinary incontinence, urinary frequency, nocturnal urination.  Endo: Negative for unusual weight change.    Physical Examination:  BP 136/77   Pulse 71   Ht 5' 2.5" (1.588 m)   Wt 149 lb 12.8 oz (67.9 kg)   BMI 26.96 kg/m   General: Well-nourished, well-developed in no acute distress.  Eyes: No icterus. Conjunctivae pink. Lungs: Clear to auscultation bilaterally. Non-labored. Heart: Regular rate and rhythm, no murmurs rubs or gallops.  Abdomen: Bowel sounds are normal, nontender, nondistended, no hepatosplenomegaly or masses, no abdominal bruits or hernia , no rebound or guarding.   Extremities: No lower extremity edema. No clubbing or deformities. Neuro: Alert and oriented x 3.  Grossly intact. Skin: Warm and dry, no jaundice.    Psych: Alert and cooperative, normal mood and affect.  Labs:    Imaging Studies: No results found.  Assessment and Plan:   Jenna Franklin is a 80 y.o. y/o female Who comes in today for follow-up of her cirrhosis.  The patient has been told that she needs a ultrasound every 6 months.  The patient also needs a refill of her medications and would like to try and back off on the diuretics and she is not having any further swelling.  The patient will have her Lasix and not allow refills.  She will stay off the Aldactone for now and only go back on it if her swelling should return. We have again gone over her diet and medication she should try to avoid. The patient has been explained the plan and agrees with it.     Lucilla Lame, MD. Marval Regal    Note: This dictation was prepared with Dragon dictation along with smaller phrase technology. Any transcriptional errors that result from this process are unintentional.

## 2020-04-30 DIAGNOSIS — M4807 Spinal stenosis, lumbosacral region: Secondary | ICD-10-CM | POA: Diagnosis not present

## 2020-04-30 DIAGNOSIS — K7460 Unspecified cirrhosis of liver: Secondary | ICD-10-CM | POA: Diagnosis not present

## 2020-04-30 DIAGNOSIS — H269 Unspecified cataract: Secondary | ICD-10-CM | POA: Diagnosis not present

## 2020-04-30 DIAGNOSIS — K219 Gastro-esophageal reflux disease without esophagitis: Secondary | ICD-10-CM | POA: Diagnosis not present

## 2020-04-30 DIAGNOSIS — I10 Essential (primary) hypertension: Secondary | ICD-10-CM | POA: Diagnosis not present

## 2020-04-30 DIAGNOSIS — N200 Calculus of kidney: Secondary | ICD-10-CM | POA: Diagnosis not present

## 2020-05-02 ENCOUNTER — Ambulatory Visit: Payer: Medicare HMO | Admitting: Internal Medicine

## 2020-05-07 DIAGNOSIS — H269 Unspecified cataract: Secondary | ICD-10-CM | POA: Diagnosis not present

## 2020-05-07 DIAGNOSIS — K7460 Unspecified cirrhosis of liver: Secondary | ICD-10-CM | POA: Diagnosis not present

## 2020-05-07 DIAGNOSIS — M4807 Spinal stenosis, lumbosacral region: Secondary | ICD-10-CM | POA: Diagnosis not present

## 2020-05-07 DIAGNOSIS — K219 Gastro-esophageal reflux disease without esophagitis: Secondary | ICD-10-CM | POA: Diagnosis not present

## 2020-05-07 DIAGNOSIS — I10 Essential (primary) hypertension: Secondary | ICD-10-CM | POA: Diagnosis not present

## 2020-05-07 DIAGNOSIS — N200 Calculus of kidney: Secondary | ICD-10-CM | POA: Diagnosis not present

## 2020-05-14 DIAGNOSIS — H269 Unspecified cataract: Secondary | ICD-10-CM | POA: Diagnosis not present

## 2020-05-14 DIAGNOSIS — M4807 Spinal stenosis, lumbosacral region: Secondary | ICD-10-CM | POA: Diagnosis not present

## 2020-05-14 DIAGNOSIS — K7460 Unspecified cirrhosis of liver: Secondary | ICD-10-CM | POA: Diagnosis not present

## 2020-05-14 DIAGNOSIS — K219 Gastro-esophageal reflux disease without esophagitis: Secondary | ICD-10-CM | POA: Diagnosis not present

## 2020-05-14 DIAGNOSIS — I10 Essential (primary) hypertension: Secondary | ICD-10-CM | POA: Diagnosis not present

## 2020-05-14 DIAGNOSIS — N200 Calculus of kidney: Secondary | ICD-10-CM | POA: Diagnosis not present

## 2020-05-17 DIAGNOSIS — M4807 Spinal stenosis, lumbosacral region: Secondary | ICD-10-CM | POA: Diagnosis not present

## 2020-07-04 ENCOUNTER — Telehealth: Payer: Self-pay

## 2020-07-04 NOTE — Telephone Encounter (Signed)
Patient left a voicemail in Jenna Franklin that Dr. Allen Norris wanted her to set up a ultrasound and she is ready to set that up

## 2020-07-08 ENCOUNTER — Other Ambulatory Visit: Payer: Self-pay

## 2020-07-08 DIAGNOSIS — R188 Other ascites: Secondary | ICD-10-CM

## 2020-07-08 DIAGNOSIS — K7460 Unspecified cirrhosis of liver: Secondary | ICD-10-CM

## 2020-07-09 ENCOUNTER — Other Ambulatory Visit: Payer: Self-pay

## 2020-07-17 ENCOUNTER — Other Ambulatory Visit: Payer: Self-pay

## 2020-07-17 DIAGNOSIS — K7460 Unspecified cirrhosis of liver: Secondary | ICD-10-CM

## 2020-07-17 DIAGNOSIS — R188 Other ascites: Secondary | ICD-10-CM

## 2020-07-17 MED ORDER — FUROSEMIDE 40 MG PO TABS: ORAL_TABLET | ORAL | 3 refills | Status: DC

## 2020-07-17 NOTE — Telephone Encounter (Signed)
Pt was scheduled for a RUQ abdominal US at [facility] outpatient imaging on 07/31/20. Due to her son having to bring her and being out of town during that time, pt has requested to cancel. Pt is requesting an appt in the outpatient imaging center in [place]. Contacted scheduling to request an appt in [place]. Due to an [place] tech shortage, no openings currently in [place]. Pt has requested to wait until [place] opens for appts.

## 2020-07-31 ENCOUNTER — Ambulatory Visit: Payer: Medicare HMO

## 2020-08-19 ENCOUNTER — Emergency Department
Admission: EM | Admit: 2020-08-19 | Discharge: 2020-08-19 | Disposition: A | Discharge status: Home / Self Care | Payer: Medicare HMO | Attending: Emergency Medicine | Admitting: Emergency Medicine

## 2020-08-19 ENCOUNTER — Emergency Department: Payer: Medicare HMO

## 2020-08-19 ENCOUNTER — Other Ambulatory Visit: Payer: Self-pay

## 2020-08-19 DIAGNOSIS — Z743 Need for continuous supervision: Secondary | ICD-10-CM | POA: Diagnosis not present

## 2020-08-19 DIAGNOSIS — S40022A Contusion of left upper arm, initial encounter: Secondary | ICD-10-CM | POA: Diagnosis not present

## 2020-08-19 DIAGNOSIS — Z043 Encounter for examination and observation following other accident: Secondary | ICD-10-CM | POA: Diagnosis not present

## 2020-08-19 DIAGNOSIS — Z79899 Other long term (current) drug therapy: Secondary | ICD-10-CM | POA: Diagnosis not present

## 2020-08-19 DIAGNOSIS — M542 Cervicalgia: Secondary | ICD-10-CM | POA: Diagnosis not present

## 2020-08-19 DIAGNOSIS — M549 Dorsalgia, unspecified: Secondary | ICD-10-CM

## 2020-08-19 DIAGNOSIS — R52 Pain, unspecified: Secondary | ICD-10-CM | POA: Diagnosis not present

## 2020-08-19 DIAGNOSIS — Z23 Encounter for immunization: Secondary | ICD-10-CM | POA: Insufficient documentation

## 2020-08-19 DIAGNOSIS — R42 Dizziness and giddiness: Secondary | ICD-10-CM | POA: Insufficient documentation

## 2020-08-19 DIAGNOSIS — W1839XA Other fall on same level, initial encounter: Secondary | ICD-10-CM | POA: Insufficient documentation

## 2020-08-19 DIAGNOSIS — S0990XA Unspecified injury of head, initial encounter: Secondary | ICD-10-CM | POA: Diagnosis not present

## 2020-08-19 DIAGNOSIS — M2550 Pain in unspecified joint: Secondary | ICD-10-CM | POA: Diagnosis not present

## 2020-08-19 DIAGNOSIS — W19XXXA Unspecified fall, initial encounter: Secondary | ICD-10-CM

## 2020-08-19 DIAGNOSIS — S4991XA Unspecified injury of right shoulder and upper arm, initial encounter: Secondary | ICD-10-CM | POA: Diagnosis present

## 2020-08-19 DIAGNOSIS — I110 Hypertensive heart disease with heart failure: Secondary | ICD-10-CM | POA: Insufficient documentation

## 2020-08-19 DIAGNOSIS — I509 Heart failure, unspecified: Secondary | ICD-10-CM | POA: Diagnosis not present

## 2020-08-19 DIAGNOSIS — R5381 Other malaise: Secondary | ICD-10-CM | POA: Diagnosis not present

## 2020-08-19 DIAGNOSIS — M545 Low back pain, unspecified: Secondary | ICD-10-CM | POA: Insufficient documentation

## 2020-08-19 DIAGNOSIS — T1490XA Injury, unspecified, initial encounter: Secondary | ICD-10-CM | POA: Diagnosis not present

## 2020-08-19 DIAGNOSIS — M25511 Pain in right shoulder: Secondary | ICD-10-CM | POA: Diagnosis not present

## 2020-08-19 DIAGNOSIS — S40011A Contusion of right shoulder, initial encounter: Secondary | ICD-10-CM | POA: Diagnosis not present

## 2020-08-19 DIAGNOSIS — S5001XA Contusion of right elbow, initial encounter: Secondary | ICD-10-CM | POA: Diagnosis not present

## 2020-08-19 DIAGNOSIS — Z7401 Bed confinement status: Secondary | ICD-10-CM | POA: Diagnosis not present

## 2020-08-19 LAB — CBC WITH DIFFERENTIAL/PLATELET
Abs Immature Granulocytes: 0.02 10*3/uL (ref 0.00–0.07)
Basophils Absolute: 0 10*3/uL (ref 0.0–0.1)
Basophils Relative: 0 %
Eosinophils Absolute: 0.1 10*3/uL (ref 0.0–0.5)
Eosinophils Relative: 2 %
HCT: 41.1 % (ref 36.0–46.0)
Hemoglobin: 14.3 g/dL (ref 12.0–15.0)
Immature Granulocytes: 0 %
Lymphocytes Relative: 25 %
Lymphs Abs: 1.1 10*3/uL (ref 0.7–4.0)
MCH: 34.3 pg — ABNORMAL HIGH (ref 26.0–34.0)
MCHC: 34.8 g/dL (ref 30.0–36.0)
MCV: 98.6 fL (ref 80.0–100.0)
Monocytes Absolute: 0.5 10*3/uL (ref 0.1–1.0)
Monocytes Relative: 10 %
Neutro Abs: 2.9 10*3/uL (ref 1.7–7.7)
Neutrophils Relative %: 63 %
Platelets: 115 10*3/uL — ABNORMAL LOW (ref 150–400)
RBC: 4.17 MIL/uL (ref 3.87–5.11)
RDW: 13.3 % (ref 11.5–15.5)
Smear Review: UNDETERMINED
WBC: 4.7 10*3/uL (ref 4.0–10.5)
nRBC: 0 % (ref 0.0–0.2)

## 2020-08-19 LAB — COMPREHENSIVE METABOLIC PANEL
ALT: 49 U/L — ABNORMAL HIGH (ref 0–44)
AST: 36 U/L (ref 15–41)
Albumin: 4 g/dL (ref 3.5–5.0)
Alkaline Phosphatase: 81 U/L (ref 38–126)
Anion gap: 6 (ref 5–15)
BUN: 17 mg/dL (ref 8–23)
CO2: 25 mmol/L (ref 22–32)
Calcium: 9.3 mg/dL (ref 8.9–10.3)
Chloride: 104 mmol/L (ref 98–111)
Creatinine, Ser: 0.74 mg/dL (ref 0.44–1.00)
GFR, Estimated: >60 mL/min (ref >60)
Glucose, Bld: 161 mg/dL — ABNORMAL HIGH (ref 70–99)
Potassium: 3.4 mmol/L — ABNORMAL LOW (ref 3.5–5.1)
Sodium: 135 mmol/L (ref 135–145)
Total Bilirubin: 1.3 mg/dL — ABNORMAL HIGH (ref 0.3–1.2)
Total Protein: 7.6 g/dL (ref 6.5–8.1)

## 2020-08-19 LAB — PROTIME-INR
INR: 1.2 (ref 0.8–1.2)
Prothrombin Time: 14.7 seconds (ref 11.4–15.2)

## 2020-08-19 LAB — TROPONIN I (HIGH SENSITIVITY): Troponin I (High Sensitivity): 3 ng/L (ref <18)

## 2020-08-19 MED ORDER — LIDOCAINE 5 % EX PTCH: 1.0000 | MEDICATED_PATCH | Freq: Two times a day (BID) | CUTANEOUS | 0 refills | Status: DC

## 2020-08-19 MED ORDER — LIDOCAINE 5 % EX PTCH
1.0000 | MEDICATED_PATCH | CUTANEOUS | Status: DC
  Administered 2020-08-19: 1 via TRANSDERMAL
  Filled 2020-08-19: qty 1

## 2020-08-19 MED ORDER — CYCLOBENZAPRINE HCL 5 MG PO TABS
2.5000 mg | ORAL_TABLET | Freq: Once | ORAL | Status: AC
  Administered 2020-08-19: 2.5 mg via ORAL
  Filled 2020-08-19: qty 0.5

## 2020-08-19 MED ORDER — ACETAMINOPHEN 500 MG PO TABS
1000.0000 mg | ORAL_TABLET | Freq: Once | ORAL | Status: AC
  Administered 2020-08-19: 1000 mg via ORAL
  Filled 2020-08-19: qty 2

## 2020-08-19 MED ORDER — TETANUS-DIPHTH-ACELL PERTUSSIS 5-2.5-18.5 LF-MCG/0.5 IM SUSY
0.5000 mL | PREFILLED_SYRINGE | Freq: Once | INTRAMUSCULAR | Status: AC
  Administered 2020-08-19: 0.5 mL via INTRAMUSCULAR
  Filled 2020-08-19: qty 0.5

## 2020-08-19 NOTE — ED Notes (Signed)
Dr Jari Pigg at bedside with pt.

## 2020-08-19 NOTE — ED Provider Notes (Signed)
Imaging without any acute osseous injury. Discussed findings with patient. Will plan on discharging with prescription for lidoderm.    Nance Pear, MD 08/19/20 [phone]

## 2020-08-19 NOTE — Discharge Instructions (Addendum)
Please seek medical attention for any high fevers, chest pain, shortness of breath, change in behavior, persistent vomiting, bloody stool or any other new or concerning symptoms.  

## 2020-08-19 NOTE — ED Provider Notes (Signed)
[facility] Emergency Department Provider Note  ____________________________________________   Event Date/Time   First MD Initiated Contact with Patient 08/19/20 1406     (approximate)  I have reviewed the triage vital signs and the nursing notes.   HISTORY  Chief Complaint Fall (3d ago)    HPI Jenna Franklin is a 80 y.o. female with nonalcoholic cirrhosis who comes in for arm pain.  Patient reports having a fall 3 days ago when she got dizzy.  She reports a history of vertigo and this seemed very similar to previous episodes of vertigo.  She states she closed her eyes but she does not think she blacked out does not think she hit her head but she did fall down onto the ground and is having right shoulder pain, right elbow pain as well as right neck pain and lower back spasming.  Patient states that she can take Tylenol and takes that for pain.  She is not able to take ibuprofen due to liver disease.  She states that the main reason she is here is for the back pain that is in the lower back          Past Medical History:  Diagnosis Date   Anxiety    Arthritis    back   Bronchitis    currently   CHF (congestive heart failure) ([place])    pt says she thinks she has this   Cirrhosis of liver ([place]) 2016   Depression    Esophageal varices ([place])    Esophageal varices ([place])    Gastritis without bleeding 2019   GERD (gastroesophageal reflux disease)    H/O: depression    History of kidney stones    Hypertension    Nerve root pain 91/50/5697   Non-alcoholic fatty liver disease    Nonalcoholic fatty liver disease    Osteopenia    Osteopenia    Reflux    in past   Spinal stenosis    Tachycardia 03/25/2015   Vertigo    3 yrs ago   Vitamin D deficiency    Vitamin D deficiency     Patient Active Problem List   Diagnosis Date Noted   TMJ arthralgia 05/26/2019   Lumbar spinal stenosis 07/27/2018   Thoracic compression fracture, sequela 04/01/2018    Hx of sinus tachycardia 07/06/2017   Tendinopathy of right gluteus medius 01/01/2017   Neck muscle spasm 11/18/2016   Esophageal varices in cirrhosis ([place])    Cirrhosis of liver with ascites ([place]) 05/29/2015   Post-menopausal atrophic vaginitis 05/29/2015   Acid reflux 03/25/2015   Calculus of kidney 03/25/2015   Pre-diabetes 09/20/2014   Adjustment disorder with anxious mood 10/05/2013   Bursitis, trochanteric 12/11/2011    Past Surgical History:  Procedure Laterality Date   ABDOMINAL HYSTERECTOMY     APPENDECTOMY     BREAST BIOPSY Right    neg needle bx   CATARACT EXTRACTION W/PHACO Right 01/09/2019   Procedure: CATARACT EXTRACTION PHACO AND INTRAOCULAR LENS PLACEMENT (IOC) RIGHT 8.60,        00:53.3;  Surgeon: Eulogio Bear, MD;  Location: [place];  Service: Ophthalmology;  Laterality: Right;   CATARACT EXTRACTION W/PHACO Left 01/30/2019   Procedure: CATARACT EXTRACTION PHACO AND INTRAOCULAR LENS PLACEMENT (IOC) LEFT, 2.59, 00:25.5;  Surgeon: Eulogio Bear, MD;  Location: [place];  Service: Ophthalmology;  Laterality: Left;   COMBINED HYSTERECTOMY ABDOMINAL W/ A&P REPAIR / OOPHORECTOMY     ESOPHAGOGASTRODUODENOSCOPY (EGD) WITH  PROPOFOL N/A 06/17/2015   Procedure: ESOPHAGOGASTRODUODENOSCOPY (EGD) WITH PROPOFOL;  Surgeon: Lucilla Lame, MD;  Location: [place];  Service: Endoscopy;  Laterality: N/A;   ESOPHAGOGASTRODUODENOSCOPY (EGD) WITH PROPOFOL N/A 06/17/2017   Procedure: ESOPHAGOGASTRODUODENOSCOPY (EGD) WITH PROPOFOL;  Surgeon: Lucilla Lame, MD;  Location: [place];  Service: Endoscopy;  Laterality: N/A;   LAPAROSCOPIC CHOLECYSTECTOMY     TONSILLECTOMY      Prior to Admission medications   Medication Sig Start Date End Date Taking? Authorizing Provider  fexofenadine (ALLEGRA) 180 MG tablet Take 180 mg by mouth daily.    [provider]  fluticasone (FLONASE) 50 MCG/ACT nasal spray Place 2 sprays into both nostrils  daily. 10/08/15   Glean Hess, MD  furosemide (LASIX) 40 MG tablet TAKE (1) TABLET BY MOUTH EVERY DAY 07/17/20   Lucilla Lame, MD  nadolol (CORGARD) 20 MG tablet TAKE (1) TABLET BY MOUTH EVERY DAY 04/29/20   Lucilla Lame, MD  spironolactone (ALDACTONE) 50 MG tablet TAKE 1/2 TABLET BY MOUTH ONCE DAILY. 11/20/19   Glean Hess, MD    Allergies Baclofen, Codeine, Sulfa antibiotics, and Prednisone  Family History  Problem Relation Age of Onset   Heart disease Mother    Heart disease Father    Heart attack Paternal Grandfather    Asthma Son 2   Hypertension Daughter    Breast cancer Maternal Aunt     Social History Social History   Tobacco Use   Smoking status: Never   Smokeless tobacco: Never   Tobacco comments:    smoking cessation materials not required  Vaping Use   Vaping Use: Never used  Substance Use Topics   Alcohol use: No    Alcohol/week: 0.0 standard drinks   Drug use: No      Review of Systems Constitutional: No fever/chills Eyes: No visual changes. ENT: No sore throat. Cardiovascular: Denies chest pain. Respiratory: Denies shortness of breath. Gastrointestinal: No abdominal pain.  No nausea, no vomiting.  No diarrhea.  No constipation. Genitourinary: Negative for dysuria. Musculoskeletal: Back pain, neck pain, right arm pain Skin: Negative for rash. Neurological: Negative for headaches, focal weakness or numbness. All other ROS negative ____________________________________________   PHYSICAL EXAM:  VITAL SIGNS: ED Triage Vitals  Enc Vitals Group     BP 08/19/20 1400 (!) 149/74     Pulse Rate 08/19/20 1400 79     Resp 08/19/20 1400 20     Temp 08/19/20 1400 98.7 F (37.1 C)     Temp Source 08/19/20 1400 Oral     SpO2 08/19/20 1400 95 %     Weight 08/19/20 1359 145 lb (65.8 kg)     Height 08/19/20 1359 5\' 3"  (1.6 m)     Head Circumference --      Peak Flow --      Pain Score --      Pain Loc --      Pain Edu? --      Excl. in [place]? --      Constitutional: Alert and oriented. GCS 15  Eyes: Conjunctivae are normal. EOMI. Head: Atraumatic. Nose: No congestion/rhinnorhea. Mouth/Throat: Mucous membranes are moist.   Neck: No stridor. Trachea Midline. FROM Cardiovascular: Normal rate, regular rhythm. Grossly normal heart sounds.  Good peripheral circulation. No chest wall tenderness Respiratory: Normal respiratory effort.  No retractions. Lungs CTAB. Gastrointestinal: Soft and nontender. No distention. No abdominal bruits.  Musculoskeletal:   RUE: Some scattered bruising but tenderness on the right shoulder, right collarbone.  Deformity or other signs of injury. Radial pulse intact. Neuro intact. Full ROM in joint. LUE: Bruise noted on the inner arm but no point tenderness radial pulse intact. Neuro intact. Full ROM in joints RLE: No point tenderness, deformity or other signs of injury. DP pulse intact. Neuro intact. Full ROM in joints. LLE: No point tenderness, deformity or other signs of injury. DP pulse intact. Neuro intact. Full ROM in joints. Neurologic:  Normal speech and language. No gross focal neurologic deficits are appreciated.  Skin:  Skin is warm, dry and intact. No rash noted. Psychiatric: Mood and affect are normal. Speech and behavior are normal. GU: Deferred  Back: C-spine tenderness, L-spine tenderness ____________________________________________   LABS (all labs ordered are listed, but only abnormal results are displayed)  Labs Reviewed  CBC WITH DIFFERENTIAL/PLATELET - Abnormal; Notable for the following components:      Result Value   MCH 34.3 (*)    Platelets 115 (*)    All other components within normal limits  COMPREHENSIVE METABOLIC PANEL - Abnormal; Notable for the following components:   Potassium 3.4 (*)    Glucose, Bld 161 (*)    ALT 49 (*)    Total Bilirubin 1.3 (*)    All other components within normal limits  PROTIME-INR  TROPONIN I (HIGH SENSITIVITY)    ____________________________________________   ED ECG REPORT I, Vanessa [place], the attending physician, personally viewed and interpreted this ECG.  Sinus rate of 80, no ST elevation, no T wave inversions, normal intervals ____________________________________________  RADIOLOGY I date  ____________________________________________   PROCEDURES  Procedure(s) performed (including Critical Care):  .1-3 Lead EKG Interpretation  Date/Time: 08/19/2020 2:57 PM Performed by: Vanessa [place], MD Authorized by: Vanessa La[place], MD     Interpretation: normal     ECG rate:  70s   ECG rate assessment: normal     Rhythm: sinus rhythm     Ectopy: none     Conduction: normal     ____________________________________________   INITIAL IMPRESSION / ASSESSMENT AND PLAN / ED COURSE    Malayna Noori was evaluated in Emergency Department on 08/19/2020 for the symptoms described in the history of present illness. She was evaluated in the context of the global COVID-19 pandemic, which necessitated consideration that the patient might be at risk for infection with the SARS-CoV-2 virus that causes COVID-19. Institutional protocols and algorithms that pertain to the evaluation of patients at risk for COVID-19 are in a state of rapid change based on information released by regulatory bodies including the CDC and federal and state organizations. These policies and algorithms were followed during the patient's care in the ED.    Patient is an 80 year old who comes in with back spasming right arm pain and back pain after a fall.  Will get x-rays of the arm to evaluate for fracture.  Will get CT imaging to evaluate for lumbar fracture, cervical fracture.  Given patient's age and she is not sure if she hit her head and she has cirrhosis will get CT head to make sure no evidence of intracranial hemorrhage.  We will treat patient's pain with Tylenol given she is not taking any today, lidocaine patches and a  very small dose of Flexeril given her age.  Labs are also ordered to evaluate for dizzy episode although she states that it is just her vertigo.  EKG was ordered and patient was kept on the cardiac monitor  ____________________________________________   FINAL CLINICAL IMPRESSION(S) / ED  DIAGNOSES   Final diagnoses:  Fall, initial encounter      MEDICATIONS GIVEN DURING THIS VISIT:  Medications  acetaminophen (TYLENOL) tablet 1,000 mg (has no administration in time range)  cyclobenzaprine (FLEXERIL) tablet 2.5 mg (has no administration in time range)  lidocaine (LIDODERM) 5 % 1 patch (has no administration in time range)  Tdap (BOOSTRIX) injection 0.5 mL (has no administration in time range)     ED Discharge Orders     None        Note:  This document was prepared using Dragon voice recognition software and may include unintentional dictation errors.    Vanessa Gonzales, MD 08/19/20 [phone]

## 2020-08-19 NOTE — ED Notes (Addendum)
Called daughter Manuela Schwartz to give update and explain that pt will be discharged.   Daughter states that pt will require ambulance transport. She states that "there is no way" she can "get her into the car". States pt has had ongoing severe pain since had fall and will hardly move, even to get into bed. Will discuss possibility of ambulance transport with EDP.

## 2020-08-19 NOTE — ED Triage Notes (Signed)
Pt to ED from home [facility] for fall 3d ago after had episode of vertigo. No head trauma or LOC  Pt c/o back spasms and R arm pain, wrist to collarbone, no obvious deformities noted, full ROM. Bruising noted R and L arms  Non-alcoholic cirrhosis, takes tylenol at home for chronic pain, prescribed by HCP

## 2020-08-19 NOTE — ED Notes (Signed)
EMS arrived for discharge to home.

## 2020-08-19 NOTE — ED Notes (Addendum)
Pt to xray and CT. Pt requests pain meds before goes. Flexiril not available yet but will administer tylenol now, then other meds when pt returns.

## 2020-09-09 ENCOUNTER — Encounter: Payer: Medicare HMO | Admitting: Internal Medicine

## 2020-09-10 ENCOUNTER — Telehealth: Payer: Self-pay

## 2020-09-10 NOTE — Telephone Encounter (Signed)
Returned pt's call regarding her message about something else to take for her back spasms due to her recent fall. Advised pt, Dr Allen Norris will only advise her to take Tylenol due to her cirrhosis. If she requires anything stronger for her pain, she will need to discuss with Dr Army Melia or Dr. Girtha Hake, Physical Medicine and Rehabilitation. Pt verbalized understanding and will contact one of these providers to discuss.

## 2020-09-10 NOTE — Telephone Encounter (Signed)
Patient is calling because she was seen in the ED on 08/19/2020 for a fall. She states she is in a lot of pain and was told to only to take tylenol. She wants to talk to someone about what she can take

## 2020-09-10 NOTE — Telephone Encounter (Unsigned)
Copied from [place] [phone]. Topic: Appointment Scheduling - Scheduling Inquiry for Clinic >> Sep 10, 2020  2:16 PM Jenna Franklin wrote: Reason for CRM: Pt called stating that she is having back spasms from her fall. She states that she was given tylenol to take every 6 hours and she states that it is not helping at all. She states that she cannot come in for an appt, but that she is needing to have a recommendation for another medication. Please advise.

## 2020-09-10 NOTE — Telephone Encounter (Signed)
Patient has an appointment with Dr. Alba Destine in [place] on 09/13/20.  Looks like she also called Dr. Allen Norris for medication management, but he is hesitant to prescribe anything due to known cirrhosis of the liver.  Please advise if applicable.

## 2020-09-11 NOTE — Telephone Encounter (Signed)
Patient said she had to cancel her Dr Alba Destine appt because she cannot get in the car or ever move around her house to get in her bad. Told her if she cannot get to her appointments, and she is getting that bad, then she needs to call 911 to transport her to the hospital. We cannot treat her, and neither can Dr Alba Destine without seeing her in person.  She verbalized understanding.

## 2020-09-23 ENCOUNTER — Telehealth: Payer: Self-pay | Admitting: Internal Medicine

## 2020-09-23 NOTE — Telephone Encounter (Signed)
Pt is calling because she has fallen on 08/19/20. Pt had to crawl to the phone for help. Since that time pt has been confined to a chair. Pt is sleeping in the chair even. Pt is unable to walk a lot and is on a walker. Pt son has to pay for helpers to help the pt around the home. Pt has developed sores on her buttocks that hurt. Pt has put Cordonise cream that has not helped. Pt is unable to come into the office. And is stating she is in bad shape. First available appt is next week. And pt is asking for help.  [facility]   [phone]

## 2020-09-24 NOTE — Telephone Encounter (Signed)
Pt daughter Manuela Schwartz stated pt having problems with home phone and should be contacted on cell# [phone]

## 2020-09-24 NOTE — Telephone Encounter (Signed)
Tried to call pt again phone is still busy. Pt needs to come into the office to be seen. If pt cant come to the office pt needs to call 911 for them to transfer her so she can be seen. Someone needs to see her to be able to see what is going on with her.  [facility] nurse may give results to patient if they return call to clinic, a CRM has been created.  KP

## 2020-09-24 NOTE — Telephone Encounter (Signed)
Called pt phone is busy could not leave message.  KP

## 2020-09-24 NOTE — Telephone Encounter (Signed)
Patient called on cell number listed. She says she fell on 08/19/20 and was seen in the ED. She says since then she's not able to walk very good, using a walker. She says she's not able to lie down in the bed, so she's sitting in a recliner. She says she has a red area on her bottom that started out looking like a rash, but now it's larger and not as red as it was. She's been using Cortisone cream and was wanting to get something else to put on it. I asked is she wearing diapers, she says no underwear, no diapers, only her pajama pants. She says she uses the walker to go to the bathroom. I advised she will need to be seen in the office and if she's not able to come by car to call the ambulance to bring her in. She says she's already paying twice for the ambulance when she went to the hospital and back home. She says she's upstairs and is not sure if she's able to walk down the stairs due to the severe pain she's in from the fall. She says she's supposed to see a back specialist and doesn't know how she will get there. I advised she could go back to the ED if the pain is that severe, because there are no openings until 10/02/20 with Dr. Army Melia. She says she only wants to see her, so she will talk to her daughter to see if she's able to bring her to the office next week for a visit and call back to let us know. I advised I will send this to the office for someone to review and call back to offer an appointment, patient verbalized understanding.

## 2020-09-25 NOTE — Telephone Encounter (Signed)
Spoke to pt told her she would need to come into the office to be seen or call 911 for them to transport her to the ED. Pt stated she could not do that at this time. Pt stated she will keep in touch with [place] and see what she can work out between now and next week when Dr. Army Melia returns. Pt verbalized understanding.  KP

## 2020-10-03 DIAGNOSIS — M5117 Intervertebral disc disorders with radiculopathy, lumbosacral region: Secondary | ICD-10-CM | POA: Diagnosis not present

## 2020-10-22 DIAGNOSIS — G8929 Other chronic pain: Secondary | ICD-10-CM | POA: Diagnosis not present

## 2020-10-22 DIAGNOSIS — M5441 Lumbago with sciatica, right side: Secondary | ICD-10-CM | POA: Diagnosis not present

## 2020-10-31 DIAGNOSIS — K7460 Unspecified cirrhosis of liver: Secondary | ICD-10-CM | POA: Diagnosis not present

## 2020-10-31 DIAGNOSIS — M5441 Lumbago with sciatica, right side: Secondary | ICD-10-CM | POA: Diagnosis not present

## 2020-10-31 DIAGNOSIS — M7061 Trochanteric bursitis, right hip: Secondary | ICD-10-CM | POA: Diagnosis not present

## 2020-10-31 DIAGNOSIS — K7581 Nonalcoholic steatohepatitis (NASH): Secondary | ICD-10-CM | POA: Diagnosis not present

## 2020-10-31 DIAGNOSIS — M5117 Intervertebral disc disorders with radiculopathy, lumbosacral region: Secondary | ICD-10-CM | POA: Diagnosis not present

## 2020-10-31 DIAGNOSIS — K219 Gastro-esophageal reflux disease without esophagitis: Secondary | ICD-10-CM | POA: Diagnosis not present

## 2020-10-31 DIAGNOSIS — Z87442 Personal history of urinary calculi: Secondary | ICD-10-CM | POA: Diagnosis not present

## 2020-10-31 DIAGNOSIS — I10 Essential (primary) hypertension: Secondary | ICD-10-CM | POA: Diagnosis not present

## 2020-11-04 ENCOUNTER — Ambulatory Visit: Payer: Medicare HMO | Admitting: Gastroenterology

## 2020-11-07 DIAGNOSIS — K7460 Unspecified cirrhosis of liver: Secondary | ICD-10-CM | POA: Diagnosis not present

## 2020-11-07 DIAGNOSIS — I10 Essential (primary) hypertension: Secondary | ICD-10-CM | POA: Diagnosis not present

## 2020-11-07 DIAGNOSIS — K7581 Nonalcoholic steatohepatitis (NASH): Secondary | ICD-10-CM | POA: Diagnosis not present

## 2020-11-07 DIAGNOSIS — Z87442 Personal history of urinary calculi: Secondary | ICD-10-CM | POA: Diagnosis not present

## 2020-11-07 DIAGNOSIS — M5117 Intervertebral disc disorders with radiculopathy, lumbosacral region: Secondary | ICD-10-CM | POA: Diagnosis not present

## 2020-11-07 DIAGNOSIS — K219 Gastro-esophageal reflux disease without esophagitis: Secondary | ICD-10-CM | POA: Diagnosis not present

## 2020-11-07 DIAGNOSIS — M7061 Trochanteric bursitis, right hip: Secondary | ICD-10-CM | POA: Diagnosis not present

## 2020-11-12 DIAGNOSIS — K7460 Unspecified cirrhosis of liver: Secondary | ICD-10-CM | POA: Diagnosis not present

## 2020-11-12 DIAGNOSIS — M7061 Trochanteric bursitis, right hip: Secondary | ICD-10-CM | POA: Diagnosis not present

## 2020-11-12 DIAGNOSIS — I10 Essential (primary) hypertension: Secondary | ICD-10-CM | POA: Diagnosis not present

## 2020-11-12 DIAGNOSIS — Z87442 Personal history of urinary calculi: Secondary | ICD-10-CM | POA: Diagnosis not present

## 2020-11-12 DIAGNOSIS — M5117 Intervertebral disc disorders with radiculopathy, lumbosacral region: Secondary | ICD-10-CM | POA: Diagnosis not present

## 2020-11-12 DIAGNOSIS — K219 Gastro-esophageal reflux disease without esophagitis: Secondary | ICD-10-CM | POA: Diagnosis not present

## 2020-11-12 DIAGNOSIS — K7581 Nonalcoholic steatohepatitis (NASH): Secondary | ICD-10-CM | POA: Diagnosis not present

## 2020-11-14 DIAGNOSIS — K7581 Nonalcoholic steatohepatitis (NASH): Secondary | ICD-10-CM | POA: Diagnosis not present

## 2020-11-14 DIAGNOSIS — K7460 Unspecified cirrhosis of liver: Secondary | ICD-10-CM | POA: Diagnosis not present

## 2020-11-14 DIAGNOSIS — K219 Gastro-esophageal reflux disease without esophagitis: Secondary | ICD-10-CM | POA: Diagnosis not present

## 2020-11-14 DIAGNOSIS — M7061 Trochanteric bursitis, right hip: Secondary | ICD-10-CM | POA: Diagnosis not present

## 2020-11-14 DIAGNOSIS — Z87442 Personal history of urinary calculi: Secondary | ICD-10-CM | POA: Diagnosis not present

## 2020-11-14 DIAGNOSIS — M5117 Intervertebral disc disorders with radiculopathy, lumbosacral region: Secondary | ICD-10-CM | POA: Diagnosis not present

## 2020-11-14 DIAGNOSIS — I10 Essential (primary) hypertension: Secondary | ICD-10-CM | POA: Diagnosis not present

## 2020-11-18 DIAGNOSIS — M5117 Intervertebral disc disorders with radiculopathy, lumbosacral region: Secondary | ICD-10-CM | POA: Diagnosis not present

## 2020-11-18 DIAGNOSIS — K219 Gastro-esophageal reflux disease without esophagitis: Secondary | ICD-10-CM | POA: Diagnosis not present

## 2020-11-18 DIAGNOSIS — I10 Essential (primary) hypertension: Secondary | ICD-10-CM | POA: Diagnosis not present

## 2020-11-18 DIAGNOSIS — Z87442 Personal history of urinary calculi: Secondary | ICD-10-CM | POA: Diagnosis not present

## 2020-11-18 DIAGNOSIS — M7061 Trochanteric bursitis, right hip: Secondary | ICD-10-CM | POA: Diagnosis not present

## 2020-11-18 DIAGNOSIS — K7581 Nonalcoholic steatohepatitis (NASH): Secondary | ICD-10-CM | POA: Diagnosis not present

## 2020-11-18 DIAGNOSIS — K7460 Unspecified cirrhosis of liver: Secondary | ICD-10-CM | POA: Diagnosis not present

## 2020-11-21 DIAGNOSIS — K219 Gastro-esophageal reflux disease without esophagitis: Secondary | ICD-10-CM | POA: Diagnosis not present

## 2020-11-21 DIAGNOSIS — M7061 Trochanteric bursitis, right hip: Secondary | ICD-10-CM | POA: Diagnosis not present

## 2020-11-21 DIAGNOSIS — K7460 Unspecified cirrhosis of liver: Secondary | ICD-10-CM | POA: Diagnosis not present

## 2020-11-21 DIAGNOSIS — K7581 Nonalcoholic steatohepatitis (NASH): Secondary | ICD-10-CM | POA: Diagnosis not present

## 2020-11-21 DIAGNOSIS — Z87442 Personal history of urinary calculi: Secondary | ICD-10-CM | POA: Diagnosis not present

## 2020-11-21 DIAGNOSIS — I10 Essential (primary) hypertension: Secondary | ICD-10-CM | POA: Diagnosis not present

## 2020-11-21 DIAGNOSIS — M5117 Intervertebral disc disorders with radiculopathy, lumbosacral region: Secondary | ICD-10-CM | POA: Diagnosis not present

## 2020-11-22 IMAGING — MG DIGITAL SCREENING BILAT W/ TOMO W/ CAD
6 of 10 series · 6 of 30 positions shown · non-contrast
Comparison: Previous exam(s).

CLINICAL DATA: Screening.

EXAM:
DIGITAL SCREENING BILATERAL MAMMOGRAM WITH TOMO AND CAD

[L CC synth-2D]
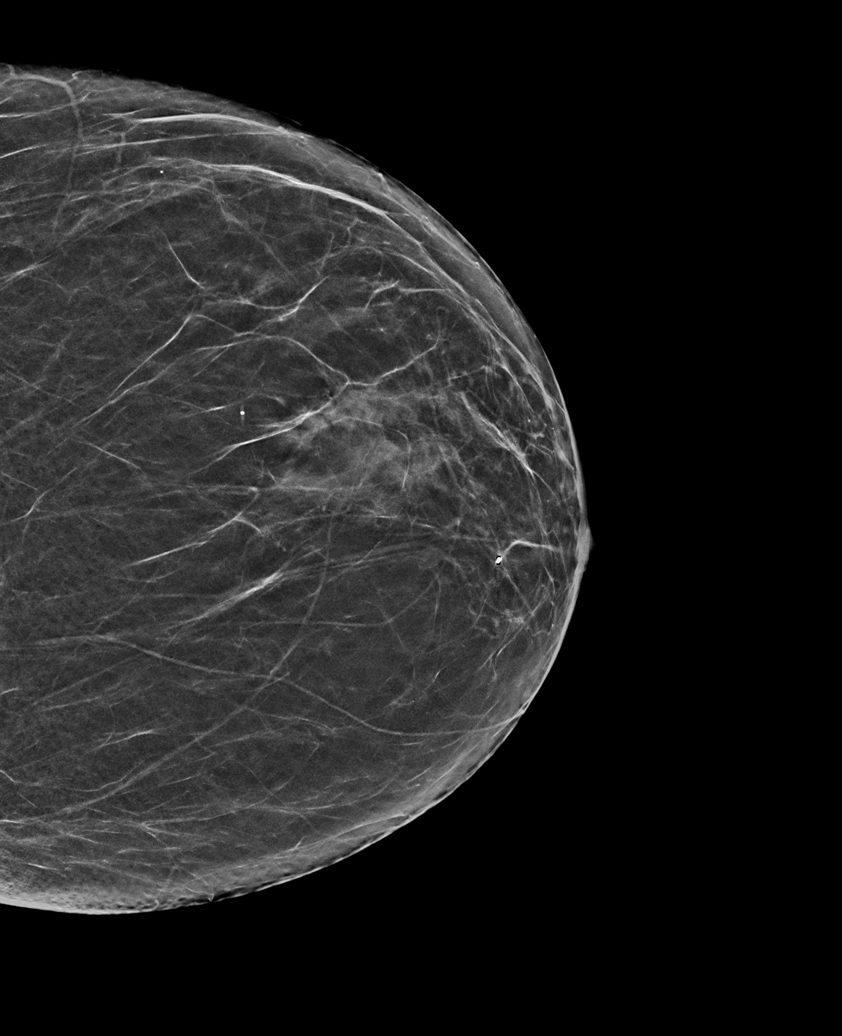

[L MLO synth-2D]
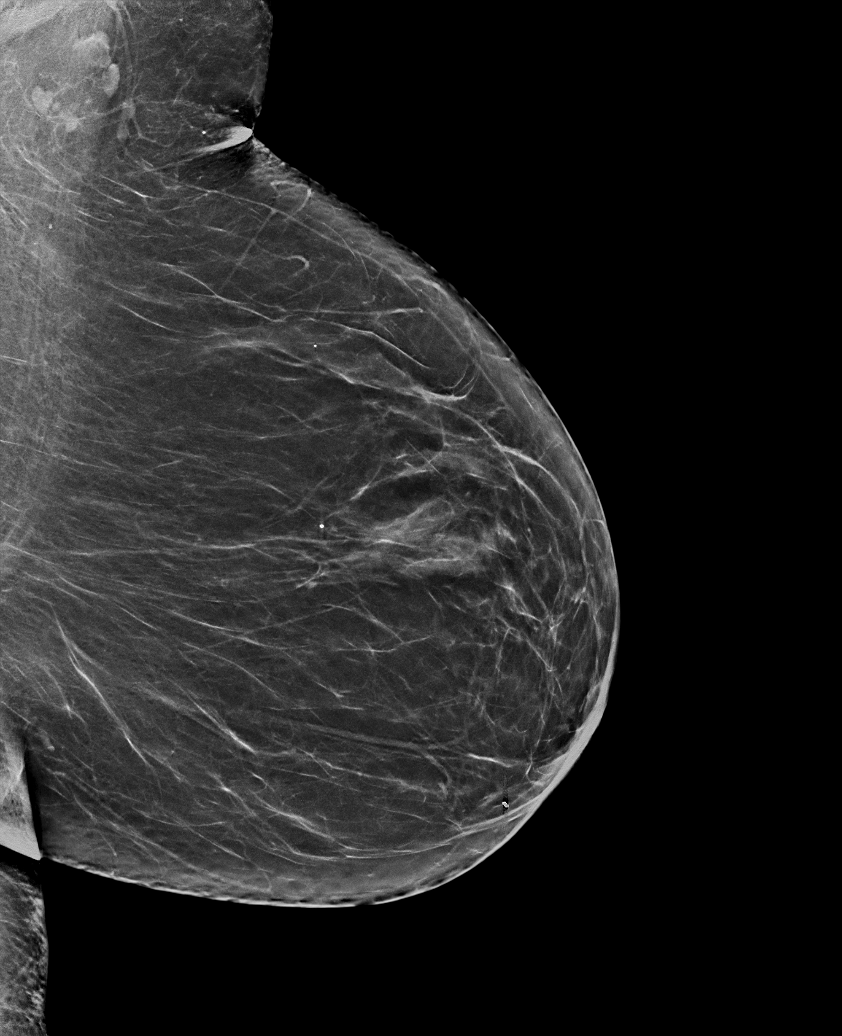

[R MLO synth-2D (1 of 2)]
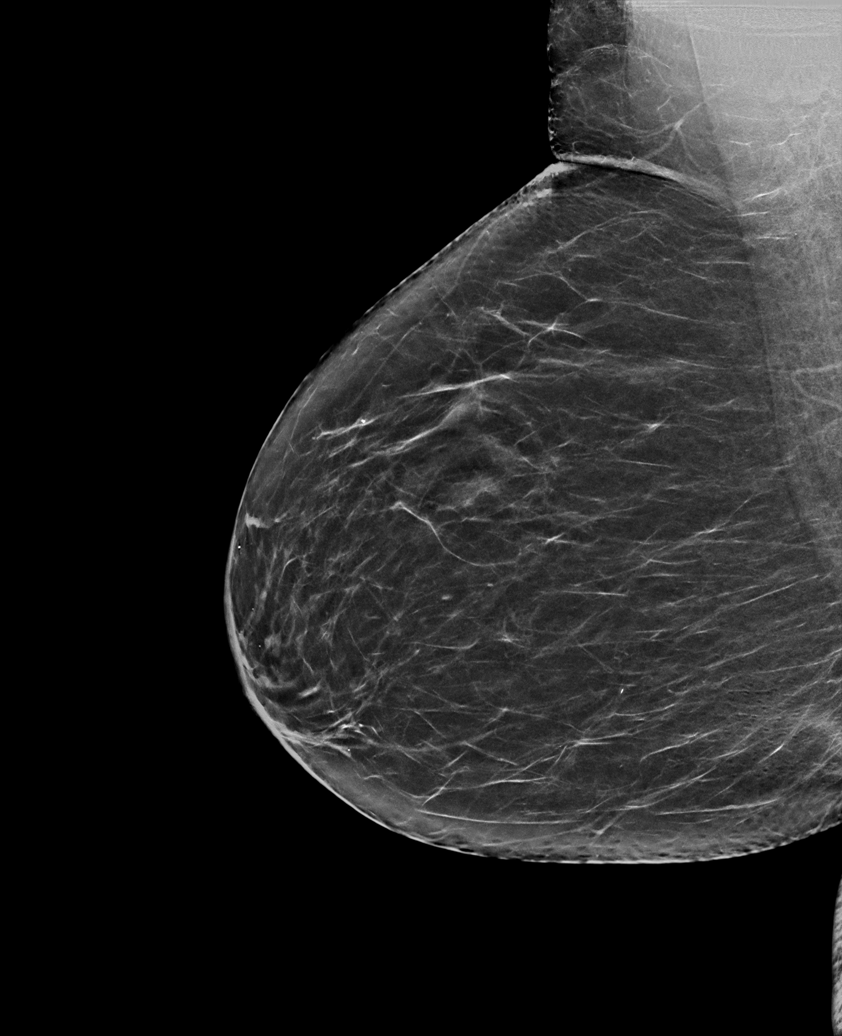

[R MLO synth-2D (2 of 2)]
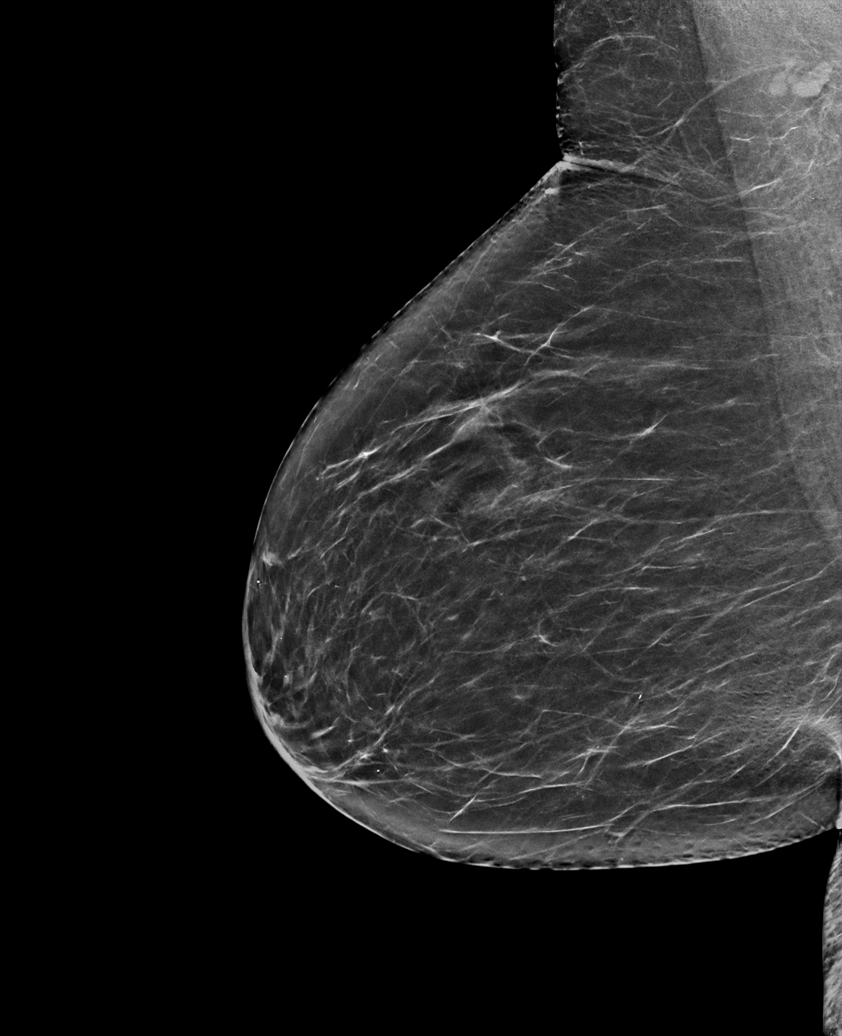

[R CC synth-2D]
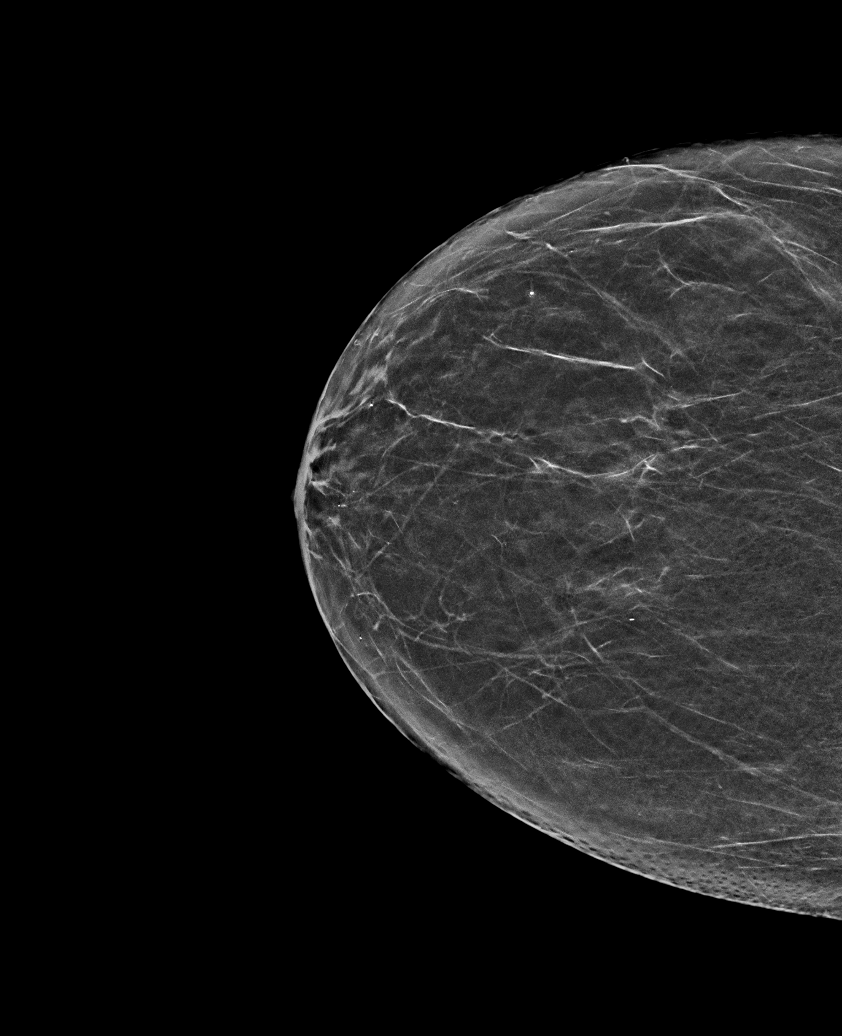

[R MLO tomo · tomo slice 34/67.0]
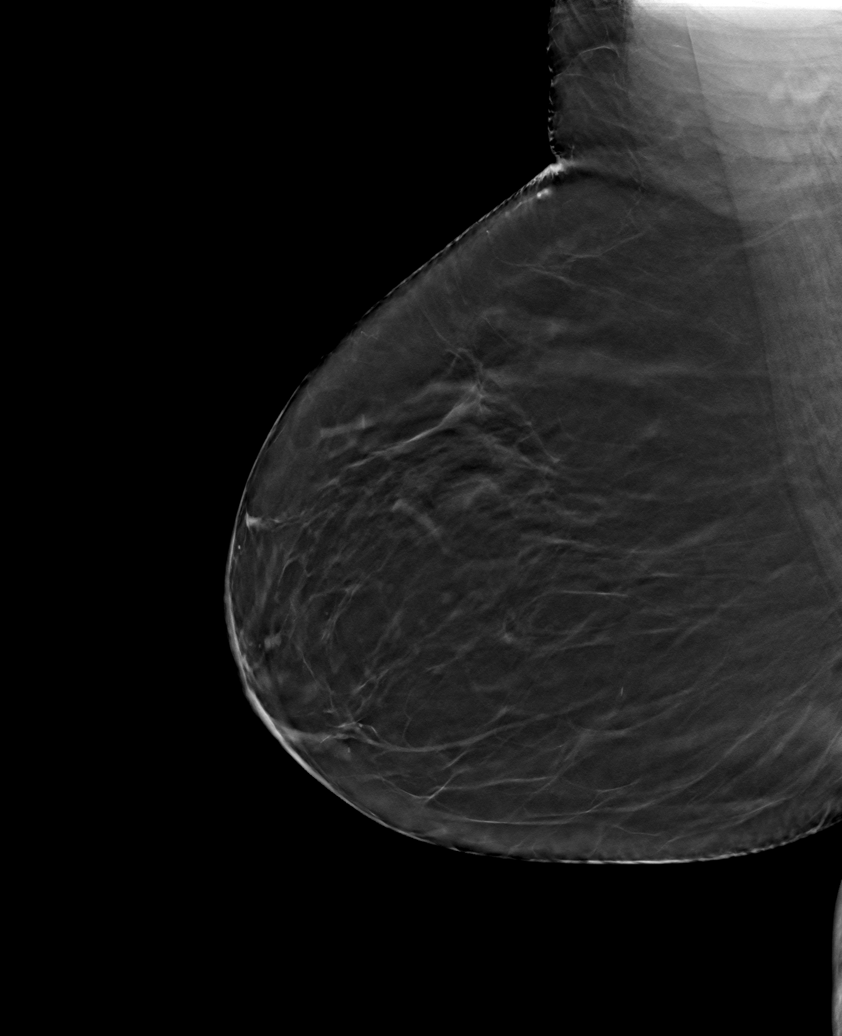

[6 of 30 positions shown; findings below may reference images not displayed]

ACR Breast Density Category b: There are scattered areas of
fibroglandular density.
FINDINGS: There are no findings suspicious for malignancy. Images were
processed with CAD.
IMPRESSION: No mammographic evidence of malignancy. A result letter of this
screening mammogram will be mailed directly to the patient.

RECOMMENDATION:
Screening mammogram in one year. (Code:[id number])

BI-RADS CATEGORY  1: Negative.

## 2020-11-26 DIAGNOSIS — K7581 Nonalcoholic steatohepatitis (NASH): Secondary | ICD-10-CM | POA: Diagnosis not present

## 2020-11-26 DIAGNOSIS — K219 Gastro-esophageal reflux disease without esophagitis: Secondary | ICD-10-CM | POA: Diagnosis not present

## 2020-11-26 DIAGNOSIS — M7061 Trochanteric bursitis, right hip: Secondary | ICD-10-CM | POA: Diagnosis not present

## 2020-11-26 DIAGNOSIS — Z87442 Personal history of urinary calculi: Secondary | ICD-10-CM | POA: Diagnosis not present

## 2020-11-26 DIAGNOSIS — I10 Essential (primary) hypertension: Secondary | ICD-10-CM | POA: Diagnosis not present

## 2020-11-26 DIAGNOSIS — M5117 Intervertebral disc disorders with radiculopathy, lumbosacral region: Secondary | ICD-10-CM | POA: Diagnosis not present

## 2020-11-26 DIAGNOSIS — K7460 Unspecified cirrhosis of liver: Secondary | ICD-10-CM | POA: Diagnosis not present

## 2020-11-28 DIAGNOSIS — K219 Gastro-esophageal reflux disease without esophagitis: Secondary | ICD-10-CM | POA: Diagnosis not present

## 2020-11-28 DIAGNOSIS — K7581 Nonalcoholic steatohepatitis (NASH): Secondary | ICD-10-CM | POA: Diagnosis not present

## 2020-11-28 DIAGNOSIS — K7460 Unspecified cirrhosis of liver: Secondary | ICD-10-CM | POA: Diagnosis not present

## 2020-11-28 DIAGNOSIS — I10 Essential (primary) hypertension: Secondary | ICD-10-CM | POA: Diagnosis not present

## 2020-11-28 DIAGNOSIS — M5117 Intervertebral disc disorders with radiculopathy, lumbosacral region: Secondary | ICD-10-CM | POA: Diagnosis not present

## 2020-11-28 DIAGNOSIS — M7061 Trochanteric bursitis, right hip: Secondary | ICD-10-CM | POA: Diagnosis not present

## 2020-11-28 DIAGNOSIS — Z87442 Personal history of urinary calculi: Secondary | ICD-10-CM | POA: Diagnosis not present

## 2020-12-03 DIAGNOSIS — K7460 Unspecified cirrhosis of liver: Secondary | ICD-10-CM | POA: Diagnosis not present

## 2020-12-03 DIAGNOSIS — Z87442 Personal history of urinary calculi: Secondary | ICD-10-CM | POA: Diagnosis not present

## 2020-12-03 DIAGNOSIS — M5117 Intervertebral disc disorders with radiculopathy, lumbosacral region: Secondary | ICD-10-CM | POA: Diagnosis not present

## 2020-12-03 DIAGNOSIS — K7581 Nonalcoholic steatohepatitis (NASH): Secondary | ICD-10-CM | POA: Diagnosis not present

## 2020-12-03 DIAGNOSIS — K219 Gastro-esophageal reflux disease without esophagitis: Secondary | ICD-10-CM | POA: Diagnosis not present

## 2020-12-03 DIAGNOSIS — I10 Essential (primary) hypertension: Secondary | ICD-10-CM | POA: Diagnosis not present

## 2020-12-03 DIAGNOSIS — M7061 Trochanteric bursitis, right hip: Secondary | ICD-10-CM | POA: Diagnosis not present

## 2020-12-05 DIAGNOSIS — M5117 Intervertebral disc disorders with radiculopathy, lumbosacral region: Secondary | ICD-10-CM | POA: Diagnosis not present

## 2020-12-05 DIAGNOSIS — K7460 Unspecified cirrhosis of liver: Secondary | ICD-10-CM | POA: Diagnosis not present

## 2020-12-05 DIAGNOSIS — M7061 Trochanteric bursitis, right hip: Secondary | ICD-10-CM | POA: Diagnosis not present

## 2020-12-05 DIAGNOSIS — K219 Gastro-esophageal reflux disease without esophagitis: Secondary | ICD-10-CM | POA: Diagnosis not present

## 2020-12-05 DIAGNOSIS — Z87442 Personal history of urinary calculi: Secondary | ICD-10-CM | POA: Diagnosis not present

## 2020-12-05 DIAGNOSIS — I10 Essential (primary) hypertension: Secondary | ICD-10-CM | POA: Diagnosis not present

## 2020-12-05 DIAGNOSIS — K7581 Nonalcoholic steatohepatitis (NASH): Secondary | ICD-10-CM | POA: Diagnosis not present

## 2020-12-10 DIAGNOSIS — K7460 Unspecified cirrhosis of liver: Secondary | ICD-10-CM | POA: Diagnosis not present

## 2020-12-10 DIAGNOSIS — M5117 Intervertebral disc disorders with radiculopathy, lumbosacral region: Secondary | ICD-10-CM | POA: Diagnosis not present

## 2020-12-10 DIAGNOSIS — K219 Gastro-esophageal reflux disease without esophagitis: Secondary | ICD-10-CM | POA: Diagnosis not present

## 2020-12-10 DIAGNOSIS — K7581 Nonalcoholic steatohepatitis (NASH): Secondary | ICD-10-CM | POA: Diagnosis not present

## 2020-12-10 DIAGNOSIS — I10 Essential (primary) hypertension: Secondary | ICD-10-CM | POA: Diagnosis not present

## 2020-12-10 DIAGNOSIS — M7061 Trochanteric bursitis, right hip: Secondary | ICD-10-CM | POA: Diagnosis not present

## 2020-12-10 DIAGNOSIS — Z87442 Personal history of urinary calculi: Secondary | ICD-10-CM | POA: Diagnosis not present

## 2020-12-12 DIAGNOSIS — M7061 Trochanteric bursitis, right hip: Secondary | ICD-10-CM | POA: Diagnosis not present

## 2020-12-12 DIAGNOSIS — M5117 Intervertebral disc disorders with radiculopathy, lumbosacral region: Secondary | ICD-10-CM | POA: Diagnosis not present

## 2020-12-12 DIAGNOSIS — K219 Gastro-esophageal reflux disease without esophagitis: Secondary | ICD-10-CM | POA: Diagnosis not present

## 2020-12-12 DIAGNOSIS — Z87442 Personal history of urinary calculi: Secondary | ICD-10-CM | POA: Diagnosis not present

## 2020-12-12 DIAGNOSIS — I10 Essential (primary) hypertension: Secondary | ICD-10-CM | POA: Diagnosis not present

## 2020-12-12 DIAGNOSIS — K7581 Nonalcoholic steatohepatitis (NASH): Secondary | ICD-10-CM | POA: Diagnosis not present

## 2020-12-12 DIAGNOSIS — K7460 Unspecified cirrhosis of liver: Secondary | ICD-10-CM | POA: Diagnosis not present

## 2020-12-17 DIAGNOSIS — I10 Essential (primary) hypertension: Secondary | ICD-10-CM | POA: Diagnosis not present

## 2020-12-17 DIAGNOSIS — M7061 Trochanteric bursitis, right hip: Secondary | ICD-10-CM | POA: Diagnosis not present

## 2020-12-17 DIAGNOSIS — K219 Gastro-esophageal reflux disease without esophagitis: Secondary | ICD-10-CM | POA: Diagnosis not present

## 2020-12-17 DIAGNOSIS — M5117 Intervertebral disc disorders with radiculopathy, lumbosacral region: Secondary | ICD-10-CM | POA: Diagnosis not present

## 2020-12-17 DIAGNOSIS — K7581 Nonalcoholic steatohepatitis (NASH): Secondary | ICD-10-CM | POA: Diagnosis not present

## 2020-12-17 DIAGNOSIS — Z87442 Personal history of urinary calculi: Secondary | ICD-10-CM | POA: Diagnosis not present

## 2020-12-17 DIAGNOSIS — K7460 Unspecified cirrhosis of liver: Secondary | ICD-10-CM | POA: Diagnosis not present

## 2020-12-19 DIAGNOSIS — K7460 Unspecified cirrhosis of liver: Secondary | ICD-10-CM | POA: Diagnosis not present

## 2020-12-19 DIAGNOSIS — K219 Gastro-esophageal reflux disease without esophagitis: Secondary | ICD-10-CM | POA: Diagnosis not present

## 2020-12-19 DIAGNOSIS — M7061 Trochanteric bursitis, right hip: Secondary | ICD-10-CM | POA: Diagnosis not present

## 2020-12-19 DIAGNOSIS — M5117 Intervertebral disc disorders with radiculopathy, lumbosacral region: Secondary | ICD-10-CM | POA: Diagnosis not present

## 2020-12-19 DIAGNOSIS — I10 Essential (primary) hypertension: Secondary | ICD-10-CM | POA: Diagnosis not present

## 2020-12-19 DIAGNOSIS — K7581 Nonalcoholic steatohepatitis (NASH): Secondary | ICD-10-CM | POA: Diagnosis not present

## 2020-12-19 DIAGNOSIS — Z87442 Personal history of urinary calculi: Secondary | ICD-10-CM | POA: Diagnosis not present

## 2020-12-24 DIAGNOSIS — K219 Gastro-esophageal reflux disease without esophagitis: Secondary | ICD-10-CM | POA: Diagnosis not present

## 2020-12-24 DIAGNOSIS — K7460 Unspecified cirrhosis of liver: Secondary | ICD-10-CM | POA: Diagnosis not present

## 2020-12-24 DIAGNOSIS — K7581 Nonalcoholic steatohepatitis (NASH): Secondary | ICD-10-CM | POA: Diagnosis not present

## 2020-12-24 DIAGNOSIS — M7061 Trochanteric bursitis, right hip: Secondary | ICD-10-CM | POA: Diagnosis not present

## 2020-12-24 DIAGNOSIS — Z87442 Personal history of urinary calculi: Secondary | ICD-10-CM | POA: Diagnosis not present

## 2020-12-24 DIAGNOSIS — M5117 Intervertebral disc disorders with radiculopathy, lumbosacral region: Secondary | ICD-10-CM | POA: Diagnosis not present

## 2020-12-24 DIAGNOSIS — I10 Essential (primary) hypertension: Secondary | ICD-10-CM | POA: Diagnosis not present

## 2020-12-27 DIAGNOSIS — K219 Gastro-esophageal reflux disease without esophagitis: Secondary | ICD-10-CM | POA: Diagnosis not present

## 2020-12-27 DIAGNOSIS — I10 Essential (primary) hypertension: Secondary | ICD-10-CM | POA: Diagnosis not present

## 2020-12-27 DIAGNOSIS — Z87442 Personal history of urinary calculi: Secondary | ICD-10-CM | POA: Diagnosis not present

## 2020-12-27 DIAGNOSIS — K7460 Unspecified cirrhosis of liver: Secondary | ICD-10-CM | POA: Diagnosis not present

## 2020-12-27 DIAGNOSIS — M5117 Intervertebral disc disorders with radiculopathy, lumbosacral region: Secondary | ICD-10-CM | POA: Diagnosis not present

## 2020-12-27 DIAGNOSIS — M7061 Trochanteric bursitis, right hip: Secondary | ICD-10-CM | POA: Diagnosis not present

## 2020-12-27 DIAGNOSIS — K7581 Nonalcoholic steatohepatitis (NASH): Secondary | ICD-10-CM | POA: Diagnosis not present

## 2020-12-30 DIAGNOSIS — M5117 Intervertebral disc disorders with radiculopathy, lumbosacral region: Secondary | ICD-10-CM | POA: Diagnosis not present

## 2020-12-31 DIAGNOSIS — I10 Essential (primary) hypertension: Secondary | ICD-10-CM | POA: Diagnosis not present

## 2020-12-31 DIAGNOSIS — K7581 Nonalcoholic steatohepatitis (NASH): Secondary | ICD-10-CM | POA: Diagnosis not present

## 2020-12-31 DIAGNOSIS — K219 Gastro-esophageal reflux disease without esophagitis: Secondary | ICD-10-CM | POA: Diagnosis not present

## 2020-12-31 DIAGNOSIS — M5117 Intervertebral disc disorders with radiculopathy, lumbosacral region: Secondary | ICD-10-CM | POA: Diagnosis not present

## 2020-12-31 DIAGNOSIS — K7460 Unspecified cirrhosis of liver: Secondary | ICD-10-CM | POA: Diagnosis not present

## 2020-12-31 DIAGNOSIS — M7061 Trochanteric bursitis, right hip: Secondary | ICD-10-CM | POA: Diagnosis not present

## 2020-12-31 DIAGNOSIS — Z87442 Personal history of urinary calculi: Secondary | ICD-10-CM | POA: Diagnosis not present

## 2021-01-07 DIAGNOSIS — Z87442 Personal history of urinary calculi: Secondary | ICD-10-CM | POA: Diagnosis not present

## 2021-01-07 DIAGNOSIS — K7460 Unspecified cirrhosis of liver: Secondary | ICD-10-CM | POA: Diagnosis not present

## 2021-01-07 DIAGNOSIS — I10 Essential (primary) hypertension: Secondary | ICD-10-CM | POA: Diagnosis not present

## 2021-01-07 DIAGNOSIS — K219 Gastro-esophageal reflux disease without esophagitis: Secondary | ICD-10-CM | POA: Diagnosis not present

## 2021-01-07 DIAGNOSIS — M5117 Intervertebral disc disorders with radiculopathy, lumbosacral region: Secondary | ICD-10-CM | POA: Diagnosis not present

## 2021-01-07 DIAGNOSIS — M7061 Trochanteric bursitis, right hip: Secondary | ICD-10-CM | POA: Diagnosis not present

## 2021-01-07 DIAGNOSIS — K7581 Nonalcoholic steatohepatitis (NASH): Secondary | ICD-10-CM | POA: Diagnosis not present

## 2021-01-13 DIAGNOSIS — M48062 Spinal stenosis, lumbar region with neurogenic claudication: Secondary | ICD-10-CM | POA: Diagnosis not present

## 2021-01-14 DIAGNOSIS — K7460 Unspecified cirrhosis of liver: Secondary | ICD-10-CM | POA: Diagnosis not present

## 2021-01-14 DIAGNOSIS — Z87442 Personal history of urinary calculi: Secondary | ICD-10-CM | POA: Diagnosis not present

## 2021-01-14 DIAGNOSIS — K219 Gastro-esophageal reflux disease without esophagitis: Secondary | ICD-10-CM | POA: Diagnosis not present

## 2021-01-14 DIAGNOSIS — M7061 Trochanteric bursitis, right hip: Secondary | ICD-10-CM | POA: Diagnosis not present

## 2021-01-14 DIAGNOSIS — M5117 Intervertebral disc disorders with radiculopathy, lumbosacral region: Secondary | ICD-10-CM | POA: Diagnosis not present

## 2021-01-14 DIAGNOSIS — I10 Essential (primary) hypertension: Secondary | ICD-10-CM | POA: Diagnosis not present

## 2021-01-14 DIAGNOSIS — K7581 Nonalcoholic steatohepatitis (NASH): Secondary | ICD-10-CM | POA: Diagnosis not present

## 2021-01-21 DIAGNOSIS — Z87442 Personal history of urinary calculi: Secondary | ICD-10-CM | POA: Diagnosis not present

## 2021-01-21 DIAGNOSIS — K219 Gastro-esophageal reflux disease without esophagitis: Secondary | ICD-10-CM | POA: Diagnosis not present

## 2021-01-21 DIAGNOSIS — K7460 Unspecified cirrhosis of liver: Secondary | ICD-10-CM | POA: Diagnosis not present

## 2021-01-21 DIAGNOSIS — I10 Essential (primary) hypertension: Secondary | ICD-10-CM | POA: Diagnosis not present

## 2021-01-21 DIAGNOSIS — M7061 Trochanteric bursitis, right hip: Secondary | ICD-10-CM | POA: Diagnosis not present

## 2021-01-21 DIAGNOSIS — K7581 Nonalcoholic steatohepatitis (NASH): Secondary | ICD-10-CM | POA: Diagnosis not present

## 2021-01-21 DIAGNOSIS — M5117 Intervertebral disc disorders with radiculopathy, lumbosacral region: Secondary | ICD-10-CM | POA: Diagnosis not present

## 2021-01-28 DIAGNOSIS — K219 Gastro-esophageal reflux disease without esophagitis: Secondary | ICD-10-CM | POA: Diagnosis not present

## 2021-01-28 DIAGNOSIS — Z87442 Personal history of urinary calculi: Secondary | ICD-10-CM | POA: Diagnosis not present

## 2021-01-28 DIAGNOSIS — M7061 Trochanteric bursitis, right hip: Secondary | ICD-10-CM | POA: Diagnosis not present

## 2021-01-28 DIAGNOSIS — K7460 Unspecified cirrhosis of liver: Secondary | ICD-10-CM | POA: Diagnosis not present

## 2021-01-28 DIAGNOSIS — K7581 Nonalcoholic steatohepatitis (NASH): Secondary | ICD-10-CM | POA: Diagnosis not present

## 2021-01-28 DIAGNOSIS — I10 Essential (primary) hypertension: Secondary | ICD-10-CM | POA: Diagnosis not present

## 2021-01-28 DIAGNOSIS — M5117 Intervertebral disc disorders with radiculopathy, lumbosacral region: Secondary | ICD-10-CM | POA: Diagnosis not present

## 2021-02-04 DIAGNOSIS — M7061 Trochanteric bursitis, right hip: Secondary | ICD-10-CM | POA: Diagnosis not present

## 2021-02-04 DIAGNOSIS — I10 Essential (primary) hypertension: Secondary | ICD-10-CM | POA: Diagnosis not present

## 2021-02-04 DIAGNOSIS — Z87442 Personal history of urinary calculi: Secondary | ICD-10-CM | POA: Diagnosis not present

## 2021-02-04 DIAGNOSIS — K7460 Unspecified cirrhosis of liver: Secondary | ICD-10-CM | POA: Diagnosis not present

## 2021-02-04 DIAGNOSIS — K219 Gastro-esophageal reflux disease without esophagitis: Secondary | ICD-10-CM | POA: Diagnosis not present

## 2021-02-04 DIAGNOSIS — M5117 Intervertebral disc disorders with radiculopathy, lumbosacral region: Secondary | ICD-10-CM | POA: Diagnosis not present

## 2021-02-04 DIAGNOSIS — K7581 Nonalcoholic steatohepatitis (NASH): Secondary | ICD-10-CM | POA: Diagnosis not present

## 2021-02-11 DIAGNOSIS — K219 Gastro-esophageal reflux disease without esophagitis: Secondary | ICD-10-CM | POA: Diagnosis not present

## 2021-02-11 DIAGNOSIS — K7460 Unspecified cirrhosis of liver: Secondary | ICD-10-CM | POA: Diagnosis not present

## 2021-02-11 DIAGNOSIS — M5117 Intervertebral disc disorders with radiculopathy, lumbosacral region: Secondary | ICD-10-CM | POA: Diagnosis not present

## 2021-02-11 DIAGNOSIS — K7581 Nonalcoholic steatohepatitis (NASH): Secondary | ICD-10-CM | POA: Diagnosis not present

## 2021-02-11 DIAGNOSIS — I10 Essential (primary) hypertension: Secondary | ICD-10-CM | POA: Diagnosis not present

## 2021-02-11 DIAGNOSIS — Z87442 Personal history of urinary calculi: Secondary | ICD-10-CM | POA: Diagnosis not present

## 2021-02-11 DIAGNOSIS — M7061 Trochanteric bursitis, right hip: Secondary | ICD-10-CM | POA: Diagnosis not present

## 2021-02-18 DIAGNOSIS — K7581 Nonalcoholic steatohepatitis (NASH): Secondary | ICD-10-CM | POA: Diagnosis not present

## 2021-02-18 DIAGNOSIS — Z87442 Personal history of urinary calculi: Secondary | ICD-10-CM | POA: Diagnosis not present

## 2021-02-18 DIAGNOSIS — I10 Essential (primary) hypertension: Secondary | ICD-10-CM | POA: Diagnosis not present

## 2021-02-18 DIAGNOSIS — M5117 Intervertebral disc disorders with radiculopathy, lumbosacral region: Secondary | ICD-10-CM | POA: Diagnosis not present

## 2021-02-18 DIAGNOSIS — K219 Gastro-esophageal reflux disease without esophagitis: Secondary | ICD-10-CM | POA: Diagnosis not present

## 2021-02-18 DIAGNOSIS — K7460 Unspecified cirrhosis of liver: Secondary | ICD-10-CM | POA: Diagnosis not present

## 2021-02-18 DIAGNOSIS — M7061 Trochanteric bursitis, right hip: Secondary | ICD-10-CM | POA: Diagnosis not present

## 2021-02-25 DIAGNOSIS — M5117 Intervertebral disc disorders with radiculopathy, lumbosacral region: Secondary | ICD-10-CM | POA: Diagnosis not present

## 2021-02-25 DIAGNOSIS — K7460 Unspecified cirrhosis of liver: Secondary | ICD-10-CM | POA: Diagnosis not present

## 2021-02-25 DIAGNOSIS — I10 Essential (primary) hypertension: Secondary | ICD-10-CM | POA: Diagnosis not present

## 2021-02-25 DIAGNOSIS — M7061 Trochanteric bursitis, right hip: Secondary | ICD-10-CM | POA: Diagnosis not present

## 2021-02-25 DIAGNOSIS — Z87442 Personal history of urinary calculi: Secondary | ICD-10-CM | POA: Diagnosis not present

## 2021-02-25 DIAGNOSIS — K219 Gastro-esophageal reflux disease without esophagitis: Secondary | ICD-10-CM | POA: Diagnosis not present

## 2021-02-25 DIAGNOSIS — K7581 Nonalcoholic steatohepatitis (NASH): Secondary | ICD-10-CM | POA: Diagnosis not present

## 2021-03-04 DIAGNOSIS — I10 Essential (primary) hypertension: Secondary | ICD-10-CM | POA: Diagnosis not present

## 2021-03-04 DIAGNOSIS — M5117 Intervertebral disc disorders with radiculopathy, lumbosacral region: Secondary | ICD-10-CM | POA: Diagnosis not present

## 2021-03-04 DIAGNOSIS — Z87442 Personal history of urinary calculi: Secondary | ICD-10-CM | POA: Diagnosis not present

## 2021-03-04 DIAGNOSIS — K7581 Nonalcoholic steatohepatitis (NASH): Secondary | ICD-10-CM | POA: Diagnosis not present

## 2021-03-04 DIAGNOSIS — K219 Gastro-esophageal reflux disease without esophagitis: Secondary | ICD-10-CM | POA: Diagnosis not present

## 2021-03-04 DIAGNOSIS — M7061 Trochanteric bursitis, right hip: Secondary | ICD-10-CM | POA: Diagnosis not present

## 2021-03-04 DIAGNOSIS — K7460 Unspecified cirrhosis of liver: Secondary | ICD-10-CM | POA: Diagnosis not present

## 2021-03-11 DIAGNOSIS — K219 Gastro-esophageal reflux disease without esophagitis: Secondary | ICD-10-CM | POA: Diagnosis not present

## 2021-03-11 DIAGNOSIS — M5117 Intervertebral disc disorders with radiculopathy, lumbosacral region: Secondary | ICD-10-CM | POA: Diagnosis not present

## 2021-03-11 DIAGNOSIS — I10 Essential (primary) hypertension: Secondary | ICD-10-CM | POA: Diagnosis not present

## 2021-03-11 DIAGNOSIS — M7061 Trochanteric bursitis, right hip: Secondary | ICD-10-CM | POA: Diagnosis not present

## 2021-03-11 DIAGNOSIS — K7460 Unspecified cirrhosis of liver: Secondary | ICD-10-CM | POA: Diagnosis not present

## 2021-03-11 DIAGNOSIS — K7581 Nonalcoholic steatohepatitis (NASH): Secondary | ICD-10-CM | POA: Diagnosis not present

## 2021-03-11 DIAGNOSIS — Z87442 Personal history of urinary calculi: Secondary | ICD-10-CM | POA: Diagnosis not present

## 2021-03-18 DIAGNOSIS — Z87442 Personal history of urinary calculi: Secondary | ICD-10-CM | POA: Diagnosis not present

## 2021-03-18 DIAGNOSIS — M7061 Trochanteric bursitis, right hip: Secondary | ICD-10-CM | POA: Diagnosis not present

## 2021-03-18 DIAGNOSIS — I10 Essential (primary) hypertension: Secondary | ICD-10-CM | POA: Diagnosis not present

## 2021-03-18 DIAGNOSIS — K7460 Unspecified cirrhosis of liver: Secondary | ICD-10-CM | POA: Diagnosis not present

## 2021-03-18 DIAGNOSIS — K7581 Nonalcoholic steatohepatitis (NASH): Secondary | ICD-10-CM | POA: Diagnosis not present

## 2021-03-18 DIAGNOSIS — K219 Gastro-esophageal reflux disease without esophagitis: Secondary | ICD-10-CM | POA: Diagnosis not present

## 2021-03-18 DIAGNOSIS — M5117 Intervertebral disc disorders with radiculopathy, lumbosacral region: Secondary | ICD-10-CM | POA: Diagnosis not present

## 2021-03-24 ENCOUNTER — Ambulatory Visit: Payer: Medicare HMO

## 2021-03-25 DIAGNOSIS — K219 Gastro-esophageal reflux disease without esophagitis: Secondary | ICD-10-CM | POA: Diagnosis not present

## 2021-03-25 DIAGNOSIS — M7061 Trochanteric bursitis, right hip: Secondary | ICD-10-CM | POA: Diagnosis not present

## 2021-03-25 DIAGNOSIS — I10 Essential (primary) hypertension: Secondary | ICD-10-CM | POA: Diagnosis not present

## 2021-03-25 DIAGNOSIS — K7460 Unspecified cirrhosis of liver: Secondary | ICD-10-CM | POA: Diagnosis not present

## 2021-03-25 DIAGNOSIS — M5117 Intervertebral disc disorders with radiculopathy, lumbosacral region: Secondary | ICD-10-CM | POA: Diagnosis not present

## 2021-03-25 DIAGNOSIS — K7581 Nonalcoholic steatohepatitis (NASH): Secondary | ICD-10-CM | POA: Diagnosis not present

## 2021-03-25 DIAGNOSIS — Z87442 Personal history of urinary calculi: Secondary | ICD-10-CM | POA: Diagnosis not present

## 2021-04-01 DIAGNOSIS — M7061 Trochanteric bursitis, right hip: Secondary | ICD-10-CM | POA: Diagnosis not present

## 2021-04-01 DIAGNOSIS — K7581 Nonalcoholic steatohepatitis (NASH): Secondary | ICD-10-CM | POA: Diagnosis not present

## 2021-04-01 DIAGNOSIS — K7460 Unspecified cirrhosis of liver: Secondary | ICD-10-CM | POA: Diagnosis not present

## 2021-04-01 DIAGNOSIS — K219 Gastro-esophageal reflux disease without esophagitis: Secondary | ICD-10-CM | POA: Diagnosis not present

## 2021-04-01 DIAGNOSIS — M5117 Intervertebral disc disorders with radiculopathy, lumbosacral region: Secondary | ICD-10-CM | POA: Diagnosis not present

## 2021-04-01 DIAGNOSIS — I10 Essential (primary) hypertension: Secondary | ICD-10-CM | POA: Diagnosis not present

## 2021-04-01 DIAGNOSIS — Z87442 Personal history of urinary calculi: Secondary | ICD-10-CM | POA: Diagnosis not present

## 2021-04-08 DIAGNOSIS — I10 Essential (primary) hypertension: Secondary | ICD-10-CM | POA: Diagnosis not present

## 2021-04-08 DIAGNOSIS — K219 Gastro-esophageal reflux disease without esophagitis: Secondary | ICD-10-CM | POA: Diagnosis not present

## 2021-04-08 DIAGNOSIS — Z87442 Personal history of urinary calculi: Secondary | ICD-10-CM | POA: Diagnosis not present

## 2021-04-08 DIAGNOSIS — M5117 Intervertebral disc disorders with radiculopathy, lumbosacral region: Secondary | ICD-10-CM | POA: Diagnosis not present

## 2021-04-08 DIAGNOSIS — K7460 Unspecified cirrhosis of liver: Secondary | ICD-10-CM | POA: Diagnosis not present

## 2021-04-08 DIAGNOSIS — M7061 Trochanteric bursitis, right hip: Secondary | ICD-10-CM | POA: Diagnosis not present

## 2021-04-08 DIAGNOSIS — K7581 Nonalcoholic steatohepatitis (NASH): Secondary | ICD-10-CM | POA: Diagnosis not present

## 2021-04-15 DIAGNOSIS — K219 Gastro-esophageal reflux disease without esophagitis: Secondary | ICD-10-CM | POA: Diagnosis not present

## 2021-04-15 DIAGNOSIS — M7061 Trochanteric bursitis, right hip: Secondary | ICD-10-CM | POA: Diagnosis not present

## 2021-04-15 DIAGNOSIS — K7581 Nonalcoholic steatohepatitis (NASH): Secondary | ICD-10-CM | POA: Diagnosis not present

## 2021-04-15 DIAGNOSIS — I10 Essential (primary) hypertension: Secondary | ICD-10-CM | POA: Diagnosis not present

## 2021-04-15 DIAGNOSIS — Z87442 Personal history of urinary calculi: Secondary | ICD-10-CM | POA: Diagnosis not present

## 2021-04-15 DIAGNOSIS — M5117 Intervertebral disc disorders with radiculopathy, lumbosacral region: Secondary | ICD-10-CM | POA: Diagnosis not present

## 2021-04-15 DIAGNOSIS — K7460 Unspecified cirrhosis of liver: Secondary | ICD-10-CM | POA: Diagnosis not present

## 2021-04-24 DIAGNOSIS — I10 Essential (primary) hypertension: Secondary | ICD-10-CM | POA: Diagnosis not present

## 2021-04-24 DIAGNOSIS — M5117 Intervertebral disc disorders with radiculopathy, lumbosacral region: Secondary | ICD-10-CM | POA: Diagnosis not present

## 2021-04-24 DIAGNOSIS — K7581 Nonalcoholic steatohepatitis (NASH): Secondary | ICD-10-CM | POA: Diagnosis not present

## 2021-04-24 DIAGNOSIS — K7460 Unspecified cirrhosis of liver: Secondary | ICD-10-CM | POA: Diagnosis not present

## 2021-04-24 DIAGNOSIS — M7061 Trochanteric bursitis, right hip: Secondary | ICD-10-CM | POA: Diagnosis not present

## 2021-04-24 DIAGNOSIS — Z87442 Personal history of urinary calculi: Secondary | ICD-10-CM | POA: Diagnosis not present

## 2021-04-24 DIAGNOSIS — K219 Gastro-esophageal reflux disease without esophagitis: Secondary | ICD-10-CM | POA: Diagnosis not present

## 2021-04-29 DIAGNOSIS — Z87442 Personal history of urinary calculi: Secondary | ICD-10-CM | POA: Diagnosis not present

## 2021-04-29 DIAGNOSIS — K219 Gastro-esophageal reflux disease without esophagitis: Secondary | ICD-10-CM | POA: Diagnosis not present

## 2021-04-29 DIAGNOSIS — M7061 Trochanteric bursitis, right hip: Secondary | ICD-10-CM | POA: Diagnosis not present

## 2021-04-29 DIAGNOSIS — I10 Essential (primary) hypertension: Secondary | ICD-10-CM | POA: Diagnosis not present

## 2021-04-29 DIAGNOSIS — K7581 Nonalcoholic steatohepatitis (NASH): Secondary | ICD-10-CM | POA: Diagnosis not present

## 2021-04-29 DIAGNOSIS — M5117 Intervertebral disc disorders with radiculopathy, lumbosacral region: Secondary | ICD-10-CM | POA: Diagnosis not present

## 2021-04-29 DIAGNOSIS — K7460 Unspecified cirrhosis of liver: Secondary | ICD-10-CM | POA: Diagnosis not present

## 2021-05-05 ENCOUNTER — Telehealth: Payer: Self-pay

## 2021-05-05 ENCOUNTER — Telehealth: Payer: Self-pay | Admitting: Internal Medicine

## 2021-05-05 NOTE — Telephone Encounter (Signed)
Called Patient to schedule an appointment for office visit. Patient informed me that she fell July 4th of last year and can not walk. Patient's nurse was there with her assisting her in the shower. Patient stated that she misses Dr. Army Melia, and will see her when she can get her transportation straighten out.  ?

## 2021-05-05 NOTE — Telephone Encounter (Signed)
Pt called back saying was very upset because she still needs DR. Berglund as her provider but states she can not walk and can't get a ride to the office. ? ?CB#  [phone] ?

## 2021-05-05 NOTE — Telephone Encounter (Signed)
Called pt and spoke with Nurse Aid in home with patient. Informed she will need to be seen here in person to continue care with Dr Army Melia since it has been since 2019 that we have seen her. She verbalized understanding. ?

## 2021-05-05 NOTE — Telephone Encounter (Signed)
We have not seen this patient since July of 2021. Please call her to schedule an OV in person per her insurance company. ? ?Once scheduled, please send back to me with date and time so I can send to her insurance company. Also, you can let her know she needs to be seen at least once yearly to continue care. ? ? ?Thank you. ?

## 2021-05-08 DIAGNOSIS — M7061 Trochanteric bursitis, right hip: Secondary | ICD-10-CM | POA: Diagnosis not present

## 2021-05-08 DIAGNOSIS — I10 Essential (primary) hypertension: Secondary | ICD-10-CM | POA: Diagnosis not present

## 2021-05-08 DIAGNOSIS — K219 Gastro-esophageal reflux disease without esophagitis: Secondary | ICD-10-CM | POA: Diagnosis not present

## 2021-05-08 DIAGNOSIS — Z87442 Personal history of urinary calculi: Secondary | ICD-10-CM | POA: Diagnosis not present

## 2021-05-08 DIAGNOSIS — M5117 Intervertebral disc disorders with radiculopathy, lumbosacral region: Secondary | ICD-10-CM | POA: Diagnosis not present

## 2021-05-08 DIAGNOSIS — K7460 Unspecified cirrhosis of liver: Secondary | ICD-10-CM | POA: Diagnosis not present

## 2021-05-08 DIAGNOSIS — K7581 Nonalcoholic steatohepatitis (NASH): Secondary | ICD-10-CM | POA: Diagnosis not present

## 2021-05-13 DIAGNOSIS — K219 Gastro-esophageal reflux disease without esophagitis: Secondary | ICD-10-CM | POA: Diagnosis not present

## 2021-05-13 DIAGNOSIS — I10 Essential (primary) hypertension: Secondary | ICD-10-CM | POA: Diagnosis not present

## 2021-05-13 DIAGNOSIS — M7061 Trochanteric bursitis, right hip: Secondary | ICD-10-CM | POA: Diagnosis not present

## 2021-05-13 DIAGNOSIS — K7460 Unspecified cirrhosis of liver: Secondary | ICD-10-CM | POA: Diagnosis not present

## 2021-05-13 DIAGNOSIS — Z87442 Personal history of urinary calculi: Secondary | ICD-10-CM | POA: Diagnosis not present

## 2021-05-13 DIAGNOSIS — M5117 Intervertebral disc disorders with radiculopathy, lumbosacral region: Secondary | ICD-10-CM | POA: Diagnosis not present

## 2021-05-13 DIAGNOSIS — K7581 Nonalcoholic steatohepatitis (NASH): Secondary | ICD-10-CM | POA: Diagnosis not present

## 2021-05-22 DIAGNOSIS — K7581 Nonalcoholic steatohepatitis (NASH): Secondary | ICD-10-CM | POA: Diagnosis not present

## 2021-05-22 DIAGNOSIS — M5117 Intervertebral disc disorders with radiculopathy, lumbosacral region: Secondary | ICD-10-CM | POA: Diagnosis not present

## 2021-05-22 DIAGNOSIS — K7460 Unspecified cirrhosis of liver: Secondary | ICD-10-CM | POA: Diagnosis not present

## 2021-05-22 DIAGNOSIS — I10 Essential (primary) hypertension: Secondary | ICD-10-CM | POA: Diagnosis not present

## 2021-05-22 DIAGNOSIS — M7061 Trochanteric bursitis, right hip: Secondary | ICD-10-CM | POA: Diagnosis not present

## 2021-05-22 DIAGNOSIS — K219 Gastro-esophageal reflux disease without esophagitis: Secondary | ICD-10-CM | POA: Diagnosis not present

## 2021-05-22 DIAGNOSIS — Z87442 Personal history of urinary calculi: Secondary | ICD-10-CM | POA: Diagnosis not present

## 2021-05-27 DIAGNOSIS — K219 Gastro-esophageal reflux disease without esophagitis: Secondary | ICD-10-CM | POA: Diagnosis not present

## 2021-05-27 DIAGNOSIS — M7061 Trochanteric bursitis, right hip: Secondary | ICD-10-CM | POA: Diagnosis not present

## 2021-05-27 DIAGNOSIS — M5117 Intervertebral disc disorders with radiculopathy, lumbosacral region: Secondary | ICD-10-CM | POA: Diagnosis not present

## 2021-05-27 DIAGNOSIS — K7460 Unspecified cirrhosis of liver: Secondary | ICD-10-CM | POA: Diagnosis not present

## 2021-05-27 DIAGNOSIS — Z87442 Personal history of urinary calculi: Secondary | ICD-10-CM | POA: Diagnosis not present

## 2021-05-27 DIAGNOSIS — I10 Essential (primary) hypertension: Secondary | ICD-10-CM | POA: Diagnosis not present

## 2021-05-27 DIAGNOSIS — K7581 Nonalcoholic steatohepatitis (NASH): Secondary | ICD-10-CM | POA: Diagnosis not present

## 2021-05-28 ENCOUNTER — Other Ambulatory Visit: Payer: Self-pay | Admitting: Gastroenterology

## 2021-05-28 DIAGNOSIS — R188 Other ascites: Secondary | ICD-10-CM

## 2021-06-02 ENCOUNTER — Telehealth: Payer: Self-pay | Admitting: Gastroenterology

## 2021-06-02 NOTE — Telephone Encounter (Signed)
Pt calls and request follow up ultrasound on liver to be scheduled, she has not has this done since 2020, last OV 2022, can you schedule this or will she have to come into office for app? ?

## 2021-06-03 DIAGNOSIS — I10 Essential (primary) hypertension: Secondary | ICD-10-CM | POA: Diagnosis not present

## 2021-06-03 DIAGNOSIS — K219 Gastro-esophageal reflux disease without esophagitis: Secondary | ICD-10-CM | POA: Diagnosis not present

## 2021-06-03 DIAGNOSIS — K7581 Nonalcoholic steatohepatitis (NASH): Secondary | ICD-10-CM | POA: Diagnosis not present

## 2021-06-03 DIAGNOSIS — M7061 Trochanteric bursitis, right hip: Secondary | ICD-10-CM | POA: Diagnosis not present

## 2021-06-03 DIAGNOSIS — Z87442 Personal history of urinary calculi: Secondary | ICD-10-CM | POA: Diagnosis not present

## 2021-06-03 DIAGNOSIS — M5117 Intervertebral disc disorders with radiculopathy, lumbosacral region: Secondary | ICD-10-CM | POA: Diagnosis not present

## 2021-06-03 DIAGNOSIS — K7460 Unspecified cirrhosis of liver: Secondary | ICD-10-CM | POA: Diagnosis not present

## 2021-06-06 ENCOUNTER — Other Ambulatory Visit: Payer: Self-pay

## 2021-06-06 DIAGNOSIS — K7460 Unspecified cirrhosis of liver: Secondary | ICD-10-CM

## 2021-06-10 ENCOUNTER — Encounter: Payer: Self-pay | Admitting: Internal Medicine

## 2021-06-10 ENCOUNTER — Ambulatory Visit (INDEPENDENT_AMBULATORY_CARE_PROVIDER_SITE_OTHER): Payer: Medicare HMO | Admitting: Internal Medicine

## 2021-06-10 VITALS — BP 122/68 | HR 66 | Ht 63.0 in | Wt 142.0 lb

## 2021-06-10 DIAGNOSIS — Z87442 Personal history of urinary calculi: Secondary | ICD-10-CM | POA: Diagnosis not present

## 2021-06-10 DIAGNOSIS — K7581 Nonalcoholic steatohepatitis (NASH): Secondary | ICD-10-CM | POA: Diagnosis not present

## 2021-06-10 DIAGNOSIS — I8510 Secondary esophageal varices without bleeding: Secondary | ICD-10-CM | POA: Diagnosis not present

## 2021-06-10 DIAGNOSIS — K7460 Unspecified cirrhosis of liver: Secondary | ICD-10-CM

## 2021-06-10 DIAGNOSIS — M48061 Spinal stenosis, lumbar region without neurogenic claudication: Secondary | ICD-10-CM

## 2021-06-10 DIAGNOSIS — R188 Other ascites: Secondary | ICD-10-CM | POA: Diagnosis not present

## 2021-06-10 DIAGNOSIS — R0981 Nasal congestion: Secondary | ICD-10-CM

## 2021-06-10 DIAGNOSIS — K219 Gastro-esophageal reflux disease without esophagitis: Secondary | ICD-10-CM | POA: Diagnosis not present

## 2021-06-10 DIAGNOSIS — R7303 Prediabetes: Secondary | ICD-10-CM | POA: Diagnosis not present

## 2021-06-10 DIAGNOSIS — M7061 Trochanteric bursitis, right hip: Secondary | ICD-10-CM | POA: Diagnosis not present

## 2021-06-10 DIAGNOSIS — M5117 Intervertebral disc disorders with radiculopathy, lumbosacral region: Secondary | ICD-10-CM | POA: Diagnosis not present

## 2021-06-10 DIAGNOSIS — R634 Abnormal weight loss: Secondary | ICD-10-CM

## 2021-06-10 DIAGNOSIS — I10 Essential (primary) hypertension: Secondary | ICD-10-CM | POA: Diagnosis not present

## 2021-06-10 NOTE — Progress Notes (Signed)
? ? ?Date:  06/10/2021  ? ?Name:  Jenna Franklin   DOB:  [birth year]   MRN:  [id number] ? ? ?Chief Complaint: Follow-up (CNA comes 4 days a week, PT comes 1 day a week ), Back Pain, and Diabetes ? ?Back Pain ?This is a chronic problem. The problem has been gradually worsening since onset. The pain is present in the lumbar spine. The pain is moderate. The pain is The same all the time. Pertinent negatives include no abdominal pain, chest pain, dysuria, fever or headaches. Treatments tried: ESI by pain management at [facility].  ?Diabetes ?She presents for her follow-up diabetic visit. Diabetes type: prediabetes. Her disease course has been stable. Pertinent negatives for hypoglycemia include no dizziness, headaches or nervousness/anxiousness. Pertinent negatives for diabetes include no chest pain and no fatigue. Current diabetic treatment includes diet.  ?Cirrhosis of the liver - she has not been seen by GI in over one year.  She does have an [place] scheduled to screen for [facility].  She is taking Corgard 20 mg daily and furosemide 40 mg daily.  She was here 2 years ago she has lost about 19 pounds.  She has been able to discontinue spironolactone.  She denies any bleeding issues or other concerns.  No jaundice, vomiting, reflux, or choking.  She has an abdominal ultrasound pending-she is waiting for the call without appointment. ? ?Labs has not been seen here for almost 2 years.  Shortly after her last visit in 2021 she fell and sustained significant back injury.  There were no fractures.  The fall was caused by vertigo.  She has been essentially homebound since that time, requiring a rollator for ambulation at home and outside the house.  She has been seen by pain management and getting epidural steroid injections for severe lumbar stenosis.  She has home therapy once a week and is making slow progress, however trips outside the home require her son to help her in and out of the car and to do the driving.  She has a home health  aide 3 hours a day 4 days/week to help her with bathing, meals, and light housework.  With this support she continues to live alone. ?Lab Results  ?Component Value Date  ? NA 135 08/19/2020  ? K 3.4 (L) 08/19/2020  ? CO2 25 08/19/2020  ? GLUCOSE 161 (H) 08/19/2020  ? BUN 17 08/19/2020  ? CREATININE 0.74 08/19/2020  ? CALCIUM 9.3 08/19/2020  ? GFRNONAA >60 08/19/2020  ? ?Lab Results  ?Component Value Date  ? CHOL 139 09/06/2019  ? HDL 48 09/06/2019  ? [place] 74 09/06/2019  ? TRIG 85 09/06/2019  ? CHOLHDL 2.9 09/06/2019  ? ?Lab Results  ?Component Value Date  ? TSH 3.487 09/06/2019  ? ?Lab Results  ?Component Value Date  ? HGBA1C 6.4 (H) 09/06/2019  ? ?Lab Results  ?Component Value Date  ? WBC 4.7 08/19/2020  ? HGB 14.3 08/19/2020  ? HCT 41.1 08/19/2020  ? MCV 98.6 08/19/2020  ? PLT 115 (L) 08/19/2020  ? ?Lab Results  ?Component Value Date  ? ALT 49 (H) 08/19/2020  ? AST 36 08/19/2020  ? ALKPHOS 81 08/19/2020  ? BILITOT 1.3 (H) 08/19/2020  ? ?No results found for: 25OHVITD2, [place], VD25OH  ? ?Review of Systems  ?Constitutional:  Positive for unexpected weight change. Negative for chills, fatigue and fever.  ?HENT:  Negative for tinnitus and trouble swallowing.   ?Respiratory:  Negative for cough, chest tightness and shortness of breath.   ?  Cardiovascular:  Negative for chest pain, palpitations and leg swelling.  ?Gastrointestinal:  Negative for abdominal pain, blood in stool, constipation and diarrhea.  ?Genitourinary:  Negative for dysuria.  ?Musculoskeletal:  Positive for back pain and gait problem.  ?Skin:  Negative for color change and rash.  ?Neurological:  Negative for dizziness, light-headedness and headaches.  ?Psychiatric/Behavioral:  Negative for dysphoric mood and sleep disturbance. The patient is not nervous/anxious.   ? ?Patient Active Problem List  ? Diagnosis Date Noted  ? TMJ arthralgia 05/26/2019  ? Lumbar spinal stenosis 07/27/2018  ? Thoracic compression fracture, sequela 04/01/2018  ?  Esophageal varices in cirrhosis (HCC)   ? Cirrhosis of liver with ascites ([place]) 05/29/2015  ? Post-menopausal atrophic vaginitis 05/29/2015  ? Acid reflux 03/25/2015  ? Pre-diabetes 09/20/2014  ? Bursitis, trochanteric 12/11/2011  ? ? ?Allergies  ?Allergen Reactions  ? Baclofen Anxiety  ? Codeine Nausea And Vomiting  ? Sulfa Antibiotics Nausea Only  ? Prednisone Anxiety  ? ? ?Past Surgical History:  ?Procedure Laterality Date  ? ABDOMINAL HYSTERECTOMY    ? APPENDECTOMY    ? BREAST BIOPSY Right   ? neg needle bx  ? CATARACT EXTRACTION W/PHACO Right 01/09/2019  ? Procedure: CATARACT EXTRACTION PHACO AND INTRAOCULAR LENS PLACEMENT (IOC) RIGHT 8.60,        00:53.3;  Surgeon: Eulogio Bear, MD;  Location: [facility];  Service: Ophthalmology;  Laterality: Right;  ? CATARACT EXTRACTION W/PHACO Left 01/30/2019  ? Procedure: CATARACT EXTRACTION PHACO AND INTRAOCULAR LENS PLACEMENT (I[facility]) LEFT, 2.59, 00:25.5;  Surgeon: Eulogio Bear, MD;  Location: [facility];  Service: Ophthalmology;  Laterality: Left;  ? COMBINED HYSTERECTOMY ABDOMINAL W/ A&P REPAIR / OOPHORECTOMY    ? ESOPHAGOGASTRODUODENOSCOPY (EGD) WITH PROPOFOL N/A 06/17/2015  ? Procedure: ESOPHAGOGASTRODUODENOSCOPY (EGD) WITH PROPOFOL;  Surgeon: Lucilla Lame, MD;  Location: [place];  Service: Endoscopy;  Laterality: N/A;  ? ESOPHAGOGASTRODUODENOSCOPY (EGD) WITH PROPOFOL N/A 06/17/2017  ? Procedure: ESOPHAGOGASTRODUODENOSCOPY (EGD) WITH PROPOFOL;  Surgeon: Lucilla Lame, MD;  Location: [place];  Service: Endoscopy;  Laterality: N/A;  ? LAPAROSCOPIC CHOLECYSTECTOMY    ? TONSILLECTOMY    ? ? ?Social History  ? ?Tobacco Use  ? Smoking status: Never  ? Smokeless tobacco: Never  ? Tobacco comments:  ?  smoking cessation materials not required  ?Vaping Use  ? Vaping Use: Never used  ?Substance Use Topics  ? Alcohol use: No  ?  Alcohol/week: 0.0 standard drinks  ? Drug use: No  ? ? ? ?Medication list has been reviewed and  updated. ? ?Current Meds  ?Medication Sig  ? fluticasone (FLONASE) 50 MCG/ACT nasal spray Place 2 sprays into both nostrils daily.  ? furosemide (LASIX) 40 MG tablet TAKE (1) TABLET BY MOUTH EVERY DAY  ? lidocaine (LIDODERM) 5 % Place 1 patch onto the skin every 12 (twelve) hours. Remove & Discard patch within 12 hours or as directed by MD  ? nadolol (CORGARD) 20 MG tablet TAKE (1) TABLET BY MOUTH EVERY DAY *MUST SCHEDULE OFFICE VISIT (Patient taking differently: TAKE (1) TABLET BY MOUTH EVERY DAY *MUST SCHEDULE OFFICE VISIT)  ? [DISCONTINUED] fexofenadine (ALLEGRA) 180 MG tablet Take 180 mg by mouth daily.  ? ? ? ?  09/06/2019  ?  8:00 AM  ?GAD 7 : Generalized Anxiety Score  ?Nervous, Anxious, on Edge 0  ?Control/stop worrying 1  ?Worry too much - different things 1  ?Trouble relaxing 0  ?Restless 0  ?Easily annoyed or irritable 0  ?  Afraid - awful might happen 0  ?Total GAD 7 Score 2  ?Anxiety Difficulty Not difficult at all  ? ? ? ?  03/20/2020  ?  2:20 PM  ?Depression screen PHQ 2/9  ?Decreased Interest 0  ?Down, Depressed, Hopeless 0  ?PHQ - 2 Score 0  ? ? ?BP Readings from Last 3 Encounters:  ?06/10/21 122/68  ?08/19/20 130/65  ?04/29/20 136/77  ? ? ?Physical Exam ?Vitals and nursing note reviewed.  ?Constitutional:   ?   General: [name]. ?   Appearance: Normal appearance. She is well-developed.  ?HENT:  ?   Head: Normocephalic and atraumatic.  ?   Right Ear: Hearing, tympanic membrane and ear canal normal.  ?   Left Ear: Hearing, tympanic membrane and ear canal normal.  ?   Nose:  ?   Right Sinus: No maxillary sinus tenderness or frontal sinus tenderness.  ?   Left Sinus: No maxillary sinus tenderness or frontal sinus tenderness.  ?   Mouth/Throat:  ?   Mouth: Mucous membranes are moist.  ?   Tongue: No lesions.  ?   Pharynx: Oropharynx is clear.  ?Cardiovascular:  ?   Rate and Rhythm: Normal rate and regular rhythm.  ?   Heart sounds: Normal heart sounds.  ?Pulmonary:  ?   Effort: Pulmonary  effort is normal. No respiratory distress.  ?   Breath sounds: Normal breath sounds.  ?Abdominal:  ?   Palpations: Abdomen is soft.  ?   Tenderness: There is no abdominal tenderness. There is no right CVA tenderness or

## 2021-06-11 LAB — CBC WITH DIFFERENTIAL/PLATELET
Basophils Absolute: 0 10*3/uL (ref 0.0–0.2)
Basos: 1 %
EOS (ABSOLUTE): 0.2 10*3/uL (ref 0.0–0.4)
Eos: 4 %
Hematocrit: 40.2 % (ref 34.0–46.6)
Hemoglobin: 14.3 g/dL (ref 11.1–15.9)
Immature Grans (Abs): 0 10*3/uL (ref 0.0–0.1)
Immature Granulocytes: 0 %
Lymphocytes Absolute: 1.3 10*3/uL (ref 0.7–3.1)
Lymphs: 32 %
MCH: 34.9 pg — ABNORMAL HIGH (ref 26.6–33.0)
MCHC: 35.6 g/dL (ref 31.5–35.7)
MCV: 98 fL — ABNORMAL HIGH (ref 79–97)
Monocytes Absolute: 0.4 10*3/uL (ref 0.1–0.9)
Monocytes: 11 %
Neutrophils Absolute: 2.2 10*3/uL (ref 1.4–7.0)
Neutrophils: 52 %
Platelets: 93 10*3/uL — CL (ref 150–450)
RBC: 4.1 x10E6/uL (ref 3.77–5.28)
RDW: 12.2 % (ref 11.7–15.4)
WBC: 4.1 10*3/uL (ref 3.4–10.8)

## 2021-06-11 LAB — HEMOGLOBIN A1C
Est. average glucose Bld gHb Est-mCnc: 120 mg/dL
Hgb A1c MFr Bld: 5.8 % — ABNORMAL HIGH (ref 4.8–5.6)

## 2021-06-11 LAB — COMPREHENSIVE METABOLIC PANEL
ALT: 59 IU/L — ABNORMAL HIGH (ref 0–32)
AST: 59 IU/L — ABNORMAL HIGH (ref 0–40)
Albumin/Globulin Ratio: 2 (ref 1.2–2.2)
Albumin: 4.5 g/dL (ref 3.6–4.6)
Alkaline Phosphatase: 104 IU/L (ref 44–121)
BUN/Creatinine Ratio: 18 (ref 12–28)
BUN: 16 mg/dL (ref 8–27)
Bilirubin Total: 0.9 mg/dL (ref 0.0–1.2)
CO2: 24 mmol/L (ref 20–29)
Calcium: 9.6 mg/dL (ref 8.7–10.3)
Chloride: 103 mmol/L (ref 96–106)
Creatinine, Ser: 0.87 mg/dL (ref 0.57–1.00)
Globulin, Total: 2.3 g/dL (ref 1.5–4.5)
Glucose: 98 mg/dL (ref 70–99)
Potassium: 4.2 mmol/L (ref 3.5–5.2)
Sodium: 141 mmol/L (ref 134–144)
Total Protein: 6.8 g/dL (ref 6.0–8.5)
eGFR: 67 mL/min/{1.73_m2} (ref >59)

## 2021-06-11 LAB — TSH: TSH: 1.83 u[IU]/mL (ref 0.450–4.500)

## 2021-06-11 LAB — AFP TUMOR MARKER: AFP, Serum, Tumor Marker: 5.7 ng/mL (ref 0.0–8.7)

## 2021-06-11 NOTE — Telephone Encounter (Signed)
Left message on voicemail ? ?[place] has been scheduled in [facility] May 3rd arrive at 10:00am nothing to eat or drink 8 hours prior ? ?ALSO pt needs to schedule annual f/u for cirrhosis ?

## 2021-06-17 DIAGNOSIS — Z87442 Personal history of urinary calculi: Secondary | ICD-10-CM | POA: Diagnosis not present

## 2021-06-17 DIAGNOSIS — K7581 Nonalcoholic steatohepatitis (NASH): Secondary | ICD-10-CM | POA: Diagnosis not present

## 2021-06-17 DIAGNOSIS — K7460 Unspecified cirrhosis of liver: Secondary | ICD-10-CM | POA: Diagnosis not present

## 2021-06-17 DIAGNOSIS — I10 Essential (primary) hypertension: Secondary | ICD-10-CM | POA: Diagnosis not present

## 2021-06-17 DIAGNOSIS — K219 Gastro-esophageal reflux disease without esophagitis: Secondary | ICD-10-CM | POA: Diagnosis not present

## 2021-06-17 DIAGNOSIS — M5117 Intervertebral disc disorders with radiculopathy, lumbosacral region: Secondary | ICD-10-CM | POA: Diagnosis not present

## 2021-06-17 DIAGNOSIS — M7061 Trochanteric bursitis, right hip: Secondary | ICD-10-CM | POA: Diagnosis not present

## 2021-06-17 NOTE — Telephone Encounter (Signed)
Pt is aware and stated that she will have to call to r/s as her son cannot take her tomorrow ? ?# to central scheduling given to pt ?

## 2021-06-18 ENCOUNTER — Ambulatory Visit: Payer: Medicare HMO

## 2021-06-25 DIAGNOSIS — K7460 Unspecified cirrhosis of liver: Secondary | ICD-10-CM | POA: Diagnosis not present

## 2021-06-25 DIAGNOSIS — Z87442 Personal history of urinary calculi: Secondary | ICD-10-CM | POA: Diagnosis not present

## 2021-06-25 DIAGNOSIS — K7581 Nonalcoholic steatohepatitis (NASH): Secondary | ICD-10-CM | POA: Diagnosis not present

## 2021-06-25 DIAGNOSIS — M5117 Intervertebral disc disorders with radiculopathy, lumbosacral region: Secondary | ICD-10-CM | POA: Diagnosis not present

## 2021-06-25 DIAGNOSIS — M7061 Trochanteric bursitis, right hip: Secondary | ICD-10-CM | POA: Diagnosis not present

## 2021-06-25 DIAGNOSIS — I10 Essential (primary) hypertension: Secondary | ICD-10-CM | POA: Diagnosis not present

## 2021-06-25 DIAGNOSIS — K219 Gastro-esophageal reflux disease without esophagitis: Secondary | ICD-10-CM | POA: Diagnosis not present

## 2021-06-30 ENCOUNTER — Telehealth: Payer: Self-pay | Admitting: Internal Medicine

## 2021-06-30 NOTE — Telephone Encounter (Signed)
Jenna Franklin with [facility] is calling in to request to extend vo for PT.  ? ? ?Frequency: 1 week 9 ? ?CB: [phone]- secure voicemail  ?

## 2021-06-30 NOTE — Telephone Encounter (Signed)
Called and left VM giving extension. ? ?

## 2021-07-01 ENCOUNTER — Ambulatory Visit: Payer: Medicare HMO

## 2021-07-01 DIAGNOSIS — K219 Gastro-esophageal reflux disease without esophagitis: Secondary | ICD-10-CM | POA: Diagnosis not present

## 2021-07-01 DIAGNOSIS — M7061 Trochanteric bursitis, right hip: Secondary | ICD-10-CM | POA: Diagnosis not present

## 2021-07-01 DIAGNOSIS — K7460 Unspecified cirrhosis of liver: Secondary | ICD-10-CM | POA: Diagnosis not present

## 2021-07-01 DIAGNOSIS — M5117 Intervertebral disc disorders with radiculopathy, lumbosacral region: Secondary | ICD-10-CM | POA: Diagnosis not present

## 2021-07-01 DIAGNOSIS — Z87442 Personal history of urinary calculi: Secondary | ICD-10-CM | POA: Diagnosis not present

## 2021-07-01 DIAGNOSIS — K7581 Nonalcoholic steatohepatitis (NASH): Secondary | ICD-10-CM | POA: Diagnosis not present

## 2021-07-01 DIAGNOSIS — I10 Essential (primary) hypertension: Secondary | ICD-10-CM | POA: Diagnosis not present

## 2021-07-08 DIAGNOSIS — M5117 Intervertebral disc disorders with radiculopathy, lumbosacral region: Secondary | ICD-10-CM | POA: Diagnosis not present

## 2021-07-08 DIAGNOSIS — K7581 Nonalcoholic steatohepatitis (NASH): Secondary | ICD-10-CM | POA: Diagnosis not present

## 2021-07-08 DIAGNOSIS — I10 Essential (primary) hypertension: Secondary | ICD-10-CM | POA: Diagnosis not present

## 2021-07-08 DIAGNOSIS — K219 Gastro-esophageal reflux disease without esophagitis: Secondary | ICD-10-CM | POA: Diagnosis not present

## 2021-07-08 DIAGNOSIS — Z87442 Personal history of urinary calculi: Secondary | ICD-10-CM | POA: Diagnosis not present

## 2021-07-08 DIAGNOSIS — M7061 Trochanteric bursitis, right hip: Secondary | ICD-10-CM | POA: Diagnosis not present

## 2021-07-08 DIAGNOSIS — K7460 Unspecified cirrhosis of liver: Secondary | ICD-10-CM | POA: Diagnosis not present

## 2021-07-17 ENCOUNTER — Other Ambulatory Visit (INDEPENDENT_AMBULATORY_CARE_PROVIDER_SITE_OTHER): Payer: Medicare HMO | Admitting: Internal Medicine

## 2021-07-17 DIAGNOSIS — M7061 Trochanteric bursitis, right hip: Secondary | ICD-10-CM

## 2021-07-17 DIAGNOSIS — R188 Other ascites: Secondary | ICD-10-CM

## 2021-07-17 DIAGNOSIS — M5117 Intervertebral disc disorders with radiculopathy, lumbosacral region: Secondary | ICD-10-CM | POA: Diagnosis not present

## 2021-07-17 DIAGNOSIS — Z87442 Personal history of urinary calculi: Secondary | ICD-10-CM

## 2021-07-17 DIAGNOSIS — K7460 Unspecified cirrhosis of liver: Secondary | ICD-10-CM

## 2021-07-17 DIAGNOSIS — I10 Essential (primary) hypertension: Secondary | ICD-10-CM

## 2021-07-17 DIAGNOSIS — K7581 Nonalcoholic steatohepatitis (NASH): Secondary | ICD-10-CM

## 2021-07-17 DIAGNOSIS — K219 Gastro-esophageal reflux disease without esophagitis: Secondary | ICD-10-CM

## 2021-07-17 NOTE — Addendum Note (Signed)
Addended by: Glean Hess on: 07/17/2021 12:04 PM   Modules accepted: Level of Service

## 2021-07-17 NOTE — Progress Notes (Signed)
Received home health orders orders from [facility]. Start of care 10/31/20.   Certification and orders from 06/28/21 through 08/26/21 are reviewed, signed and faxed back to home health company.  Need of intermittent skilled services at home: leg pain  The home health care plan has been established by me and will be reviewed and updated as needed to maximize patient recovery.  I certify that all home health services have been and will be furnished to the patient while under my care.  Face-to-face encounter in which the need for home health services was established: 06/10/21  Patient is receiving home health services for the following diagnoses: Problem List Items Addressed This Visit       Digestive   Acid reflux (Chronic)   Cirrhosis of liver with ascites (HCC) (Chronic)     Musculoskeletal and Integument   Bursitis, trochanteric   Other Visit Diagnoses     Intervertebral disc disorder with radiculopathy of lumbosacral region    -  Primary   Essential hypertension, benign       Nonalcoholic steatohepatitis       Personal history of urinary calculi            Halina Maidens, MD

## 2021-07-18 ENCOUNTER — Ambulatory Visit: Payer: Medicare HMO

## 2021-07-22 ENCOUNTER — Ambulatory Visit
Admission: RE | Admit: 2021-07-22 | Discharge: 2021-07-22 | Disposition: A | Discharge status: Home / Self Care | Payer: Medicare HMO | Source: Ambulatory Visit | Attending: Gastroenterology | Admitting: Gastroenterology

## 2021-07-22 ENCOUNTER — Telehealth: Payer: Self-pay | Admitting: Internal Medicine

## 2021-07-22 DIAGNOSIS — K7460 Unspecified cirrhosis of liver: Secondary | ICD-10-CM | POA: Insufficient documentation

## 2021-07-22 DIAGNOSIS — R188 Other ascites: Secondary | ICD-10-CM | POA: Insufficient documentation

## 2021-07-22 DIAGNOSIS — Z9049 Acquired absence of other specified parts of digestive tract: Secondary | ICD-10-CM | POA: Diagnosis not present

## 2021-07-22 NOTE — Telephone Encounter (Signed)
Copied from [place]. Topic: Medicare AWV >> Jul 22, 2021 10:18 AM Cher Nakai R wrote: Reason for CRM:  Left message for patient to call back and schedule Medicare Annual Wellness Visit (AWV) in office.   If unable to come into the office for AWV,  please offer to do virtually or by telephone.  Last AWV: 03/20/2020  Please schedule at anytime with [facility] Health Advisor.      30 minute appointment for Virtual or phone 45 minute appointment for in office or Initial virtual/phone  Any questions, please call me at [phone]

## 2021-07-24 DIAGNOSIS — M7061 Trochanteric bursitis, right hip: Secondary | ICD-10-CM | POA: Diagnosis not present

## 2021-07-24 DIAGNOSIS — K7460 Unspecified cirrhosis of liver: Secondary | ICD-10-CM | POA: Diagnosis not present

## 2021-07-24 DIAGNOSIS — K7581 Nonalcoholic steatohepatitis (NASH): Secondary | ICD-10-CM | POA: Diagnosis not present

## 2021-07-24 DIAGNOSIS — I10 Essential (primary) hypertension: Secondary | ICD-10-CM | POA: Diagnosis not present

## 2021-07-24 DIAGNOSIS — M5117 Intervertebral disc disorders with radiculopathy, lumbosacral region: Secondary | ICD-10-CM | POA: Diagnosis not present

## 2021-07-24 DIAGNOSIS — K219 Gastro-esophageal reflux disease without esophagitis: Secondary | ICD-10-CM | POA: Diagnosis not present

## 2021-07-24 DIAGNOSIS — Z87442 Personal history of urinary calculi: Secondary | ICD-10-CM | POA: Diagnosis not present

## 2021-07-28 ENCOUNTER — Ambulatory Visit: Payer: Self-pay

## 2021-07-28 ENCOUNTER — Other Ambulatory Visit: Payer: Self-pay | Admitting: Gastroenterology

## 2021-07-28 DIAGNOSIS — K7460 Unspecified cirrhosis of liver: Secondary | ICD-10-CM

## 2021-07-28 NOTE — Telephone Encounter (Signed)
Noted  Pt needs to come in the office to be seen for an appointment. Need to be able to look into patients ears.  KP

## 2021-07-28 NOTE — Telephone Encounter (Signed)
  Chief Complaint: Ear pain - shooting pain from ear into side f head Symptoms: Pain - short burst of intense pain Frequency: since last week Pertinent Negatives: Patient denies fever Disposition: '[]'$ ED /'[]'$ Urgent Care (no appt availability in office) / '[]'$ Appointment(In office/virtual)/ '[]'$  [facility]/ '[]'$ Home Care/ '[]'$ Refused Recommended Disposition /'[]'$ [facility]/ '[x]'$  Follow-up with PCP Additional Notes: Pt has had this in the past. She describes this pain as fast shooting pain behind her ear into her scalp. Pain dissipates very fast. Pt was given prednisone by ENT. PT called ENT but ENT was unable to rewrite RX for Prednisone as he has not seen her in 2 years.   PT would like prednisone RX called in to [facility].  Pt has OV on weds. This appointment needs to be a phone visit.   Pt is requesting a return call.  Answer Assessment - Initial Assessment Questions 1. LOCATION: "Which ear is involved?"     Left ear - sharp pain 2. ONSET: "When did the ear start hurting"      Last week 3. SEVERITY: "How bad is the pain?"  (Scale 1-10; mild, moderate or severe)   - MILD (1-3): doesn't interfere with normal activities    - MODERATE (4-7): interferes with normal activities or awakens from sleep    - SEVERE (8-10): excruciating pain, unable to do any normal activities      10/10 -  has had 2 "attacks" today, lasting just a few moments. 4. URI SYMPTOMS: "Do you have a runny nose or cough?"     no 5. FEVER: "Do you have a fever?" If Yes, ask: "What is your temperature, how was it measured, and when did it start?"     no 6. CAUSE: "Have you been swimming recently?", "How often do you use Q-TIPS?", "Have you had any recent air travel or scuba diving?"     no 7. OTHER SYMPTOMS: "Do you have any other symptoms?" (e.g., headache, stiff neck, dizziness, vomiting, runny nose, decreased hearing)     No  - vertigo 8. PREGNANCY: "Is there any chance you are pregnant?"  "When was your last menstrual period?"     na  Protocols used: Bethann Punches

## 2021-07-28 NOTE — Telephone Encounter (Signed)
Spoke to patient on the phone she mentioned that she could not come in the office she cant drive and her son is in another town.

## 2021-07-29 DIAGNOSIS — K219 Gastro-esophageal reflux disease without esophagitis: Secondary | ICD-10-CM | POA: Diagnosis not present

## 2021-07-29 DIAGNOSIS — M7061 Trochanteric bursitis, right hip: Secondary | ICD-10-CM | POA: Diagnosis not present

## 2021-07-29 DIAGNOSIS — K7460 Unspecified cirrhosis of liver: Secondary | ICD-10-CM | POA: Diagnosis not present

## 2021-07-29 DIAGNOSIS — I10 Essential (primary) hypertension: Secondary | ICD-10-CM | POA: Diagnosis not present

## 2021-07-29 DIAGNOSIS — K7581 Nonalcoholic steatohepatitis (NASH): Secondary | ICD-10-CM | POA: Diagnosis not present

## 2021-07-29 DIAGNOSIS — Z87442 Personal history of urinary calculi: Secondary | ICD-10-CM | POA: Diagnosis not present

## 2021-07-29 DIAGNOSIS — M5117 Intervertebral disc disorders with radiculopathy, lumbosacral region: Secondary | ICD-10-CM | POA: Diagnosis not present

## 2021-07-30 ENCOUNTER — Ambulatory Visit (INDEPENDENT_AMBULATORY_CARE_PROVIDER_SITE_OTHER): Payer: Medicare HMO

## 2021-07-30 DIAGNOSIS — Z0000 Encounter for general adult medical examination without abnormal findings: Secondary | ICD-10-CM

## 2021-07-30 NOTE — Patient Instructions (Signed)
Ms. Kral , Thank you for taking time to come for your Medicare Wellness Visit. I appreciate your ongoing commitment to your health goals. Please review the following plan we discussed and let me know if I can assist you in the future.   Screening recommendations/referrals: Colonoscopy: no longer required Mammogram: no longer required Bone Density: no longer required Recommended yearly ophthalmology/optometry visit for glaucoma screening and checkup Recommended yearly dental visit for hygiene and checkup  Vaccinations: Influenza vaccine: due fall 2023 Pneumococcal vaccine: done 03/01/18 Tdap vaccine: done 08/19/20 Shingles vaccine: Shingrix discussed. Please contact your pharmacy for coverage information.  Covid-19:done 04/22/19 & 05/20/19  Advanced directives: Please bring a copy of your health care power of attorney and living will to the office at your convenience.   Conditions/risks identified: recommend continuing fall prevention at home  Next appointment: Follow up in one year for your annual wellness visit    Preventive Care 65 Years and Older, Female Preventive care refers to lifestyle choices and visits with your health care provider that can promote health and wellness. What does preventive care include? A yearly physical exam. This is also called an annual well check. Dental exams once or twice a year. Routine eye exams. Ask your health care provider how often you should have your eyes checked. Personal lifestyle choices, including: Daily care of your teeth and gums. Regular physical activity. Eating a healthy diet. Avoiding tobacco and drug use. Limiting alcohol use. Practicing safe sex. Taking low-dose aspirin every day. Taking vitamin and mineral supplements as recommended by your health care provider. What happens during an annual well check? The services and screenings done by your health care provider during your annual well check will depend on your age, overall  health, lifestyle risk factors, and family history of disease. Counseling  Your health care provider may ask you questions about your: Alcohol use. Tobacco use. Drug use. Emotional well-being. Home and relationship well-being. Sexual activity. Eating habits. History of falls. Memory and ability to understand (cognition). Work and work Statistician. Reproductive health. Screening  You may have the following tests or measurements: Height, weight, and BMI. Blood pressure. Lipid and cholesterol levels. These may be checked every 5 years, or more frequently if you are over 71 years old. Skin check. Lung cancer screening. You may have this screening every year starting at age 55 if you have a 30-pack-year history of smoking and currently smoke or have quit within the past 15 years. Fecal occult blood test (FOBT) of the stool. You may have this test every year starting at age 38. Flexible sigmoidoscopy or colonoscopy. You may have a sigmoidoscopy every 5 years or a colonoscopy every 10 years starting at age 61. Hepatitis C blood test. Hepatitis B blood test. Sexually transmitted disease (STD) testing. Diabetes screening. This is done by checking your blood sugar (glucose) after you have not eaten for a while (fasting). You may have this done every 1-3 years. Bone density scan. This is done to screen for osteoporosis. You may have this done starting at age 29. Mammogram. This may be done every 1-2 years. Talk to your health care provider about how often you should have regular mammograms. Talk with your health care provider about your test results, treatment options, and if necessary, the need for more tests. Vaccines  Your health care provider may recommend certain vaccines, such as: Influenza vaccine. This is recommended every year. Tetanus, diphtheria, and acellular pertussis (Tdap, Td) vaccine. You may need a Td booster every 10  years. Zoster vaccine. You may need this after age  15. Pneumococcal 13-valent conjugate (PCV13) vaccine. One dose is recommended after age 63. Pneumococcal polysaccharide (PPSV23) vaccine. One dose is recommended after age 49. Talk to your health care provider about which screenings and vaccines you need and how often you need them. This information is not intended to replace advice given to you by your health care provider. Make sure you discuss any questions you have with your health care provider. Document Released: 03/01/2015 Document Revised: 10/23/2015 Document Reviewed: 12/04/2014 Elsevier Interactive Patient Education  2017 [place] Prevention in the Home Falls can cause injuries. They can happen to people of all ages. There are many things you can do to make your home safe and to help prevent falls. What can I do on the outside of my home? Regularly fix the edges of walkways and driveways and fix any cracks. Remove anything that might make you trip as you walk through a door, such as a raised step or threshold. Trim any bushes or trees on the path to your home. Use bright outdoor lighting. Clear any walking paths of anything that might make someone trip, such as rocks or tools. Regularly check to see if handrails are loose or broken. Make sure that both sides of any steps have handrails. Any raised decks and porches should have guardrails on the edges. Have any leaves, snow, or ice cleared regularly. Use sand or salt on walking paths during winter. Clean up any spills in your garage right away. This includes oil or grease spills. What can I do in the bathroom? Use night lights. Install grab bars by the toilet and in the tub and shower. Do not use towel bars as grab bars. Use non-skid mats or decals in the tub or shower. If you need to sit down in the shower, use a plastic, non-slip stool. Keep the floor dry. Clean up any water that spills on the floor as soon as it happens. Remove soap buildup in the tub or shower  regularly. Attach bath mats securely with double-sided non-slip rug tape. Do not have throw rugs and other things on the floor that can make you trip. What can I do in the bedroom? Use night lights. Make sure that you have a light by your bed that is easy to reach. Do not use any sheets or blankets that are too big for your bed. They should not hang down onto the floor. Have a firm chair that has side arms. You can use this for support while you get dressed. Do not have throw rugs and other things on the floor that can make you trip. What can I do in the kitchen? Clean up any spills right away. Avoid walking on wet floors. Keep items that you use a lot in easy-to-reach places. If you need to reach something above you, use a strong step stool that has a grab bar. Keep electrical cords out of the way. Do not use floor polish or wax that makes floors slippery. If you must use wax, use non-skid floor wax. Do not have throw rugs and other things on the floor that can make you trip. What can I do with my stairs? Do not leave any items on the stairs. Make sure that there are handrails on both sides of the stairs and use them. Fix handrails that are broken or loose. Make sure that handrails are as long as the stairways. Check any carpeting to make sure  that it is firmly attached to the stairs. Fix any carpet that is loose or worn. Avoid having throw rugs at the top or bottom of the stairs. If you do have throw rugs, attach them to the floor with carpet tape. Make sure that you have a light switch at the top of the stairs and the bottom of the stairs. If you do not have them, ask someone to add them for you. What else can I do to help prevent falls? Wear shoes that: Do not have high heels. Have rubber bottoms. Are comfortable and fit you well. Are closed at the toe. Do not wear sandals. If you use a stepladder: Make sure that it is fully opened. Do not climb a closed stepladder. Make sure that  both sides of the stepladder are locked into place. Ask someone to hold it for you, if possible. Clearly mark and make sure that you can see: Any grab bars or handrails. First and last steps. Where the edge of each step is. Use tools that help you move around (mobility aids) if they are needed. These include: Canes. Walkers. Scooters. Crutches. Turn on the lights when you go into a dark area. Replace any light bulbs as soon as they burn out. Set up your furniture so you have a clear path. Avoid moving your furniture around. If any of your floors are uneven, fix them. If there are any pets around you, be aware of where they are. Review your medicines with your doctor. Some medicines can make you feel dizzy. This can increase your chance of falling. Ask your doctor what other things that you can do to help prevent falls. This information is not intended to replace advice given to you by your health care provider. Make sure you discuss any questions you have with your health care provider. Document Released: 11/29/2008 Document Revised: 07/11/2015 Document Reviewed: 03/09/2014 Elsevier Interactive Patient Education  2017 [employer].

## 2021-07-30 NOTE — Progress Notes (Signed)
Subjective:   Jenna Franklin is a 81 y.o. female who presents for Medicare Annual (Subsequent) preventive examination.  Virtual Visit via Telephone Note  I connected with  Jenna Franklin on 07/30/21 at 10:15 AM EDT by telephone and verified that I am speaking with the correct person using two identifiers.  Location: Patient: home Provider: [facility] Persons participating in the virtual visit: [facility]   I discussed the limitations, risks, security and privacy concerns of performing an evaluation and management service by telephone and the availability of in person appointments. The patient expressed understanding and agreed to proceed.  Interactive audio and video telecommunications were attempted between this nurse and patient, however failed, due to patient having technical difficulties OR patient did not have access to video capability.  We continued and completed visit with audio only.  Some vital signs may be absent or patient reported.   Jenna Marker, LPN   Review of Systems     Cardiac Risk Factors include: advanced age (>75mn, >>13women);sedentary lifestyle;hypertension     Objective:    Today's Vitals   07/30/21 1033  PainSc: 10-Worst pain ever   There is no height or weight on file to calculate BMI.     07/30/2021   10:39 AM 08/19/2020    2:02 PM 01/30/2019    7:14 AM 01/09/2019    7:14 AM 06/17/2017    8:30 AM 11/16/2016    2:00 PM 05/16/2016    9:04 AM  Advanced Directives  Does Patient Have a Medical Advance Directive? Yes No Yes Yes Yes Yes Yes  Type of AParamedicof AMount AuburnLiving will Healthcare Power of ALeisuretowneLiving will HChatsworthLiving will HCelinaLiving will HMentoneLiving will HVandergriftLiving will  Does patient want to make changes to medical advance directive?   No - Patient declined No -  Patient declined     Copy of [place] Chart? No - copy requested  No - copy requested No - copy requested No - copy requested No - copy requested No - copy requested    Current Medications (verified) Outpatient Encounter Medications as of 07/30/2021  Medication Sig   fluticasone (FLONASE) 50 MCG/ACT nasal spray Place 2 sprays into both nostrils daily.   furosemide (LASIX) 40 MG tablet TAKE ONE (1) TABLET BY MOUTH ONCE DAILY   nadolol (CORGARD) 20 MG tablet TAKE (1) TABLET BY MOUTH EVERY DAY *MUST SCHEDULE OFFICE VISIT (Patient taking differently: TAKE (1) TABLET BY MOUTH EVERY DAY *MUST SCHEDULE OFFICE VISIT)   [DISCONTINUED] lidocaine (LIDODERM) 5 % Place 1 patch onto the skin every 12 (twelve) hours. Remove & Discard patch within 12 hours or as directed by MD   No facility-administered encounter medications on file as of 07/30/2021.    Allergies (verified) Baclofen, Codeine, Sulfa antibiotics, and Prednisone   History: Past Medical History:  Diagnosis Date   Adjustment disorder with anxious mood 10/05/2013   Anxiety    Arthritis    back   Bronchitis    currently   Calculus of kidney 03/25/2015   CHF (congestive heart failure) ([place]    pt says she thinks she has this   [place]osis of [place]   Depression    Esophageal varices (HCC)    Esophageal varices ([place]    Gastritis without bleeding 2019   GERD (gastroesophageal reflux disease)    H/O: depression  History of kidney stones    Hx of sinus tachycardia 07/06/2017   Hypertension    Nerve root pain 78/29/5621   Non-alcoholic fatty liver disease    Nonalcoholic fatty liver disease    Osteopenia    Osteopenia    Reflux    in past   Spinal stenosis    Tachycardia 03/25/2015   Tendinopathy of right gluteus medius 01/01/2017   Vertigo    3 yrs ago   Vitamin D deficiency    Vitamin D deficiency    Past Surgical History:  Procedure Laterality Date   ABDOMINAL HYSTERECTOMY     APPENDECTOMY      BREAST BIOPSY Right    neg needle bx   CATARACT EXTRACTION W/PHACO Right 01/09/2019   Procedure: CATARACT EXTRACTION PHACO AND INTRAOCULAR LENS PLACEMENT (IOC) RIGHT 8.60,        00:53.3;  Surgeon: Eulogio Bear, MD;  Location: [place];  Service: Ophthalmology;  Laterality: Right;   CATARACT EXTRACTION W/PHACO Left 01/30/2019   Procedure: CATARACT EXTRACTION PHACO AND INTRAOCULAR LENS PLACEMENT (IOC) LEFT, 2.59, 00:25.5;  Surgeon: Eulogio Bear, MD;  Location: [place];  Service: Ophthalmology;  Laterality: Left;   COMBINED HYSTERECTOMY ABDOMINAL W/ A&P REPAIR / OOPHORECTOMY     ESOPHAGOGASTRODUODENOSCOPY (EGD) WITH PROPOFOL N/A 06/17/2015   Procedure: ESOPHAGOGASTRODUODENOSCOPY (EGD) WITH PROPOFOL;  Surgeon: Lucilla Lame, MD;  Location: [place];  Service: Endoscopy;  Laterality: N/A;   ESOPHAGOGASTRODUODENOSCOPY (EGD) WITH PROPOFOL N/A 06/17/2017   Procedure: ESOPHAGOGASTRODUODENOSCOPY (EGD) WITH PROPOFOL;  Surgeon: Lucilla Lame, MD;  Location: [place];  Service: Endoscopy;  Laterality: N/A;   LAPAROSCOPIC CHOLECYSTECTOMY     TONSILLECTOMY     Family History  Problem Relation Age of Onset   Heart disease Mother    Heart disease Father    Heart attack Paternal Grandfather    Asthma Son 32   Hypertension Daughter    Breast cancer Maternal Aunt    Social History   Socioeconomic History   Marital status: Widowed    Spouse name: Not on file   Number of children: Not on file   Years of education: Not on file   Highest education level: Not on file  Occupational History   Not on file  Tobacco Use   Smoking status: Never   Smokeless tobacco: Never   Tobacco comments:    smoking cessation materials not required  Vaping Use   Vaping Use: Never used  Substance and Sexual Activity   Alcohol use: No    Alcohol/week: 0.0 standard drinks of alcohol   Drug use: No   Sexual activity: Yes    Birth control/protection: Post-menopausal   Other Topics Concern   Not on file  Social History Narrative   Pt lives alone   Social Determinants of Health   Financial Resource Strain: Low Risk  (07/30/2021)   Overall Financial Resource Strain (CARDIA)    Difficulty of Paying Living Expenses: Not hard at all  Food Insecurity: No Food Insecurity (07/30/2021)   Hunger Vital Sign    Worried About Running Out of Food in the Last Year: Never true    [place] in the Last Year: Never true  Transportation Needs: No Transportation Needs (07/30/2021)   PRAPARE - Hydrologist (Medical): No    Lack of Transportation (Non-Medical): No  Physical Activity: Inactive (07/30/2021)   Exercise Vital Sign    Days of Exercise per Week: 0 days  Minutes of Exercise per Session: 0 min  Stress: Stress Concern Present (07/30/2021)   [place]    Feeling of Stress : To some extent  Social Connections: Socially Isolated (07/30/2021)   Social Connection and Isolation Panel [NHANES]    Frequency of Communication with Friends and Family: More than three times a week    Frequency of Social Gatherings with Friends and Family: Once a week    Attends Religious Services: Never    Marine scientist or Organizations: No    Attends Archivist Meetings: Never    Marital Status: Widowed    Tobacco Counseling Counseling given: Not Answered Tobacco comments: smoking cessation materials not required   Clinical Intake:  Pre-visit preparation completed: Yes  Pain : 0-10 Pain Score: 10-Worst pain ever Pain Type: Chronic pain Pain Location: Head Pain Orientation: Right, Left Pain Descriptors / Indicators: Shooting, Sharp Pain Onset: In the past 7 days Pain Frequency: Constant     Nutritional Risks: None Diabetes: No  How often do you need to have someone help you when you read instructions, pamphlets, or other written materials from your  doctor or pharmacy?: 1 - Never    Interpreter Needed?: No  Information entered by :: Jenna Marker LPN   Activities of Daily Living    07/30/2021   10:39 AM 06/10/2021    2:38 PM  In your present state of health, do you have any difficulty performing the following activities:  Hearing? 0 0  Vision? 0 0  Difficulty concentrating or making decisions? 0 1  Walking or climbing stairs? 1 1  Dressing or bathing? 1 1  Doing errands, shopping? 1 1  Preparing Food and eating ? N   Using the Toilet? N   In the past six months, have you accidently leaked urine? N   Do you have problems with loss of bowel control? N   Managing your Medications? N   Managing your Finances? N   Housekeeping or managing your Housekeeping? N     Patient Care Team: Glean Hess, MD as PCP - General (Internal Medicine) Lucilla Lame, MD as Consulting Physician (Gastroenterology) Margaretha Sheffield, MD as Consulting Physician (Otolaryngology) Eulogio Bear, MD as Consulting Physician (Ophthalmology) Doyle Askew, MD as Referring Physician (Physical Medicine and Rehabilitation)  Indicate any recent Medical Services you may have received from other than [facility] providers in the past year (date may be approximate).     Assessment:   This is a routine wellness examination for Stuti.  Hearing/Vision screen Hearing Screening - Comments::  Pt denies hearing difficulty Vision Screening - Comments:: Annual vision screenings done by Dr. Edison Pace [facility]  Dietary issues and exercise activities discussed: Current Exercise Habits: The patient does not participate in regular exercise at present, Exercise limited by: orthopedic condition(s)   Goals Addressed   None    Depression Screen    07/30/2021   10:36 AM 06/10/2021    2:37 PM 03/20/2020    2:20 PM 09/06/2019    7:59 AM 01/18/2019   11:03 AM 07/27/2018    1:43 PM 07/08/2018    3:51 PM  PHQ 2/9 Scores  PHQ - 2 Score 0 1 0 0 0 0 0  PHQ- 9  Score '2 2  2  3 '$ 0    Fall Risk    07/30/2021   10:39 AM 06/10/2021    2:38 PM 03/20/2020    2:26 PM 09/06/2019  7:59 AM 07/04/2019    2:05 PM  Fall Risk   Falls in the past year? '1 1 1 1 1  '$ Number falls in past yr: 0 0 1 0 1  Injury with Fall? '1 1 1 1 1  '$ Risk for fall due to : History of fall(s) No Fall Risks History of fall(s);Impaired balance/gait;Impaired mobility  Impaired balance/gait;History of fall(s)  Follow up Falls prevention discussed Falls evaluation completed Falls prevention discussed Falls evaluation completed Falls evaluation completed    [place]:  Any stairs in or around the home? Yes  If so, are there any without handrails? No  Home free of loose throw rugs in walkways, pet beds, electrical cords, etc? Yes  Adequate lighting in your home to reduce risk of falls? Yes   ASSISTIVE DEVICES UTILIZED TO PREVENT FALLS:  Life alert? Yes  Use of a cane, walker or w/c? Yes  Grab bars in the bathroom? Yes  Shower chair or bench in shower? No  Elevated toilet seat or a handicapped toilet? No   TIMED UP AND GO:   Was the test performed? No .  Telephonic visit.    Cognitive Function: Normal cognitive status assessed by direct observation by this Nurse Health Advisor. No abnormalities found.           11/16/2016    2:03 PM  6CIT Screen  What Year? 0 points  What month? 0 points  What time? 0 points  Count back from 20 0 points  Months in reverse 0 points  Repeat phrase 0 points  Total Score 0 points    Immunizations Immunization History  Administered Date(s) Administered   Influenza, High Dose Seasonal PF 11/16/2016   Influenza,inj,Quad PF,6+ Mos 02/18/2016   Influenza,inj,quad, With Preservative 12/21/2017, 11/23/2018   Moderna Sars-Covid-2 Vaccination 04/22/2019, 05/20/2019   Pneumococcal Conjugate-13 11/16/2016   Pneumococcal Polysaccharide-23 03/01/2018   Tdap 01/11/2017, 08/19/2020    TDAP status: Up to  date  Flu Vaccine status: Due, Education has been provided regarding the importance of this vaccine. Advised may receive this vaccine at local pharmacy or Health Dept. Aware to provide a copy of the vaccination record if obtained from local pharmacy or Health Dept. Verbalized acceptance and understanding.  Pneumococcal vaccine status: Up to date  Covid-19 vaccine status: Completed vaccines  Qualifies for Shingles Vaccine? Yes   Zostavax completed No   Shingrix Completed?: No.    Education has been provided regarding the importance of this vaccine. Patient has been advised to call insurance company to determine out of pocket expense if they have not yet received this vaccine. Advised may also receive vaccine at local pharmacy or Health Dept. Verbalized acceptance and understanding.  Screening Tests Health Maintenance  Topic Date Due   Zoster Vaccines- Shingrix (1 of 2) Never done   COVID-19 Vaccine (3 - Moderna series) 07/15/2019   INFLUENZA VACCINE  09/16/2021   TETANUS/TDAP  08/20/2030   Pneumonia Vaccine 72+ Years old  Completed   DEXA SCAN  Completed   HPV VACCINES  Aged Out    Health Maintenance  Health Maintenance Due  Topic Date Due   Zoster Vaccines- Shingrix (1 of 2) Never done   COVID-19 Vaccine (3 - Moderna series) 07/15/2019    Colorectal cancer screening: No longer required.   Mammogram status: No longer required due to age.  Bone density status: no longer required  Lung Cancer Screening: (Low Dose CT Chest recommended if Age 14-80 years, 30 pack-year  currently smoking OR have quit w/in 15years.) does not qualify.   Additional Screening:  Hepatitis C Screening: does not qualify  Vision Screening: Recommended annual ophthalmology exams for early detection of glaucoma and other disorders of the eye. Is the patient up to date with their annual eye exam?  Yes  Who is the provider or what is the name of the office in which the patient attends annual eye exams?  [facility].   Dental Screening: Recommended annual dental exams for proper oral hygiene  Community Resource Referral / Chronic Care Management: CRR required this visit?  No   CCM required this visit?  No      Plan:     I have personally reviewed and noted the following in the patient's chart:   Medical and social history Use of alcohol, tobacco or illicit drugs  Current medications and supplements including opioid prescriptions.  Functional ability and status Nutritional status Physical activity Advanced directives List of other physicians Hospitalizations, surgeries, and ER visits in previous 12 months Vitals Screenings to include cognitive, depression, and falls Referrals and appointments  In addition, I have reviewed and discussed with patient certain preventive protocols, quality metrics, and best practice recommendations. A written personalized care plan for preventive services as well as general preventive health recommendations were provided to patient.     Jenna Marker, LPN   1/61/0960   Nurse Notes: pt c/o severe sharp shooting pain on both sides of her scalp. Pt is requesting a steroid due to previous episode of this type of pain in 2020 and prednisone resolved pain. Pt also discussed with Dr. Kathyrn Sheriff who advised to see PCP bc he has not seen her in 2 years. Pt states she called Monday and was advised to come in for an appointment but she does not drive and does not have anyone to bring her to an appointment. She is taking tylenol and using an ice pack and heating pad on her head. Pt states she also tried neck exercises with her physical therapist yesterday without relief. Pt was tearful on the phone due to pain. She denies the pain being in her ears and states it is more behind jaw bone and neck on both sides. Please contact patient to advise thank you.

## 2021-08-04 MED ORDER — NADOLOL 20 MG PO TABS: ORAL_TABLET | ORAL | 1 refills | Status: DC

## 2021-08-04 MED ORDER — FUROSEMIDE 40 MG PO TABS: ORAL_TABLET | ORAL | 4 refills | Status: DC

## 2021-08-04 NOTE — Addendum Note (Signed)
Addended by: Lurlean Nanny on: 08/04/2021 09:51 AM   Modules accepted: Orders

## 2021-08-05 DIAGNOSIS — K219 Gastro-esophageal reflux disease without esophagitis: Secondary | ICD-10-CM | POA: Diagnosis not present

## 2021-08-05 DIAGNOSIS — K7460 Unspecified cirrhosis of liver: Secondary | ICD-10-CM | POA: Diagnosis not present

## 2021-08-05 DIAGNOSIS — M7061 Trochanteric bursitis, right hip: Secondary | ICD-10-CM | POA: Diagnosis not present

## 2021-08-05 DIAGNOSIS — M5117 Intervertebral disc disorders with radiculopathy, lumbosacral region: Secondary | ICD-10-CM | POA: Diagnosis not present

## 2021-08-05 DIAGNOSIS — Z87442 Personal history of urinary calculi: Secondary | ICD-10-CM | POA: Diagnosis not present

## 2021-08-05 DIAGNOSIS — K7581 Nonalcoholic steatohepatitis (NASH): Secondary | ICD-10-CM | POA: Diagnosis not present

## 2021-08-05 DIAGNOSIS — I10 Essential (primary) hypertension: Secondary | ICD-10-CM | POA: Diagnosis not present

## 2021-08-05 MED ORDER — FUROSEMIDE 40 MG PO TABS: ORAL_TABLET | ORAL | 1 refills | Status: DC

## 2021-08-05 NOTE — Addendum Note (Signed)
Addended by: Lurlean Nanny on: 08/05/2021 01:09 PM   Modules accepted: Orders

## 2021-08-08 DIAGNOSIS — M26622 Arthralgia of left temporomandibular joint: Secondary | ICD-10-CM | POA: Diagnosis not present

## 2021-08-08 DIAGNOSIS — H9203 Otalgia, bilateral: Secondary | ICD-10-CM | POA: Diagnosis not present

## 2021-08-12 DIAGNOSIS — K7460 Unspecified cirrhosis of liver: Secondary | ICD-10-CM | POA: Diagnosis not present

## 2021-08-12 DIAGNOSIS — I10 Essential (primary) hypertension: Secondary | ICD-10-CM | POA: Diagnosis not present

## 2021-08-12 DIAGNOSIS — K219 Gastro-esophageal reflux disease without esophagitis: Secondary | ICD-10-CM | POA: Diagnosis not present

## 2021-08-12 DIAGNOSIS — K7581 Nonalcoholic steatohepatitis (NASH): Secondary | ICD-10-CM | POA: Diagnosis not present

## 2021-08-12 DIAGNOSIS — Z87442 Personal history of urinary calculi: Secondary | ICD-10-CM | POA: Diagnosis not present

## 2021-08-12 DIAGNOSIS — M5117 Intervertebral disc disorders with radiculopathy, lumbosacral region: Secondary | ICD-10-CM | POA: Diagnosis not present

## 2021-08-12 DIAGNOSIS — M7061 Trochanteric bursitis, right hip: Secondary | ICD-10-CM | POA: Diagnosis not present

## 2021-08-18 DIAGNOSIS — M5117 Intervertebral disc disorders with radiculopathy, lumbosacral region: Secondary | ICD-10-CM | POA: Diagnosis not present

## 2021-08-18 DIAGNOSIS — Z87442 Personal history of urinary calculi: Secondary | ICD-10-CM | POA: Diagnosis not present

## 2021-08-18 DIAGNOSIS — I10 Essential (primary) hypertension: Secondary | ICD-10-CM | POA: Diagnosis not present

## 2021-08-18 DIAGNOSIS — K219 Gastro-esophageal reflux disease without esophagitis: Secondary | ICD-10-CM | POA: Diagnosis not present

## 2021-08-18 DIAGNOSIS — K7581 Nonalcoholic steatohepatitis (NASH): Secondary | ICD-10-CM | POA: Diagnosis not present

## 2021-08-18 DIAGNOSIS — M7061 Trochanteric bursitis, right hip: Secondary | ICD-10-CM | POA: Diagnosis not present

## 2021-08-18 DIAGNOSIS — K7460 Unspecified cirrhosis of liver: Secondary | ICD-10-CM | POA: Diagnosis not present

## 2021-08-26 DIAGNOSIS — I10 Essential (primary) hypertension: Secondary | ICD-10-CM | POA: Diagnosis not present

## 2021-08-26 DIAGNOSIS — K7460 Unspecified cirrhosis of liver: Secondary | ICD-10-CM | POA: Diagnosis not present

## 2021-08-26 DIAGNOSIS — K7581 Nonalcoholic steatohepatitis (NASH): Secondary | ICD-10-CM | POA: Diagnosis not present

## 2021-08-26 DIAGNOSIS — M7061 Trochanteric bursitis, right hip: Secondary | ICD-10-CM | POA: Diagnosis not present

## 2021-08-26 DIAGNOSIS — Z87442 Personal history of urinary calculi: Secondary | ICD-10-CM | POA: Diagnosis not present

## 2021-08-26 DIAGNOSIS — M5117 Intervertebral disc disorders with radiculopathy, lumbosacral region: Secondary | ICD-10-CM | POA: Diagnosis not present

## 2021-08-26 DIAGNOSIS — K219 Gastro-esophageal reflux disease without esophagitis: Secondary | ICD-10-CM | POA: Diagnosis not present

## 2021-08-27 DIAGNOSIS — K219 Gastro-esophageal reflux disease without esophagitis: Secondary | ICD-10-CM | POA: Diagnosis not present

## 2021-08-27 DIAGNOSIS — K7460 Unspecified cirrhosis of liver: Secondary | ICD-10-CM | POA: Diagnosis not present

## 2021-08-27 DIAGNOSIS — Z87442 Personal history of urinary calculi: Secondary | ICD-10-CM | POA: Diagnosis not present

## 2021-08-27 DIAGNOSIS — M5117 Intervertebral disc disorders with radiculopathy, lumbosacral region: Secondary | ICD-10-CM | POA: Diagnosis not present

## 2021-08-27 DIAGNOSIS — M7061 Trochanteric bursitis, right hip: Secondary | ICD-10-CM | POA: Diagnosis not present

## 2021-08-27 DIAGNOSIS — I10 Essential (primary) hypertension: Secondary | ICD-10-CM | POA: Diagnosis not present

## 2021-08-27 DIAGNOSIS — K7581 Nonalcoholic steatohepatitis (NASH): Secondary | ICD-10-CM | POA: Diagnosis not present

## 2021-09-02 DIAGNOSIS — M7061 Trochanteric bursitis, right hip: Secondary | ICD-10-CM | POA: Diagnosis not present

## 2021-09-02 DIAGNOSIS — Z87442 Personal history of urinary calculi: Secondary | ICD-10-CM | POA: Diagnosis not present

## 2021-09-02 DIAGNOSIS — K7460 Unspecified cirrhosis of liver: Secondary | ICD-10-CM | POA: Diagnosis not present

## 2021-09-02 DIAGNOSIS — I10 Essential (primary) hypertension: Secondary | ICD-10-CM | POA: Diagnosis not present

## 2021-09-02 DIAGNOSIS — K219 Gastro-esophageal reflux disease without esophagitis: Secondary | ICD-10-CM | POA: Diagnosis not present

## 2021-09-02 DIAGNOSIS — K7581 Nonalcoholic steatohepatitis (NASH): Secondary | ICD-10-CM | POA: Diagnosis not present

## 2021-09-02 DIAGNOSIS — M5117 Intervertebral disc disorders with radiculopathy, lumbosacral region: Secondary | ICD-10-CM | POA: Diagnosis not present

## 2021-09-09 DIAGNOSIS — I10 Essential (primary) hypertension: Secondary | ICD-10-CM | POA: Diagnosis not present

## 2021-09-09 DIAGNOSIS — M5117 Intervertebral disc disorders with radiculopathy, lumbosacral region: Secondary | ICD-10-CM | POA: Diagnosis not present

## 2021-09-09 DIAGNOSIS — K7581 Nonalcoholic steatohepatitis (NASH): Secondary | ICD-10-CM | POA: Diagnosis not present

## 2021-09-09 DIAGNOSIS — Z87442 Personal history of urinary calculi: Secondary | ICD-10-CM | POA: Diagnosis not present

## 2021-09-09 DIAGNOSIS — K219 Gastro-esophageal reflux disease without esophagitis: Secondary | ICD-10-CM | POA: Diagnosis not present

## 2021-09-09 DIAGNOSIS — K7460 Unspecified cirrhosis of liver: Secondary | ICD-10-CM | POA: Diagnosis not present

## 2021-09-09 DIAGNOSIS — M7061 Trochanteric bursitis, right hip: Secondary | ICD-10-CM | POA: Diagnosis not present

## 2021-09-16 DIAGNOSIS — K7460 Unspecified cirrhosis of liver: Secondary | ICD-10-CM | POA: Diagnosis not present

## 2021-09-16 DIAGNOSIS — K219 Gastro-esophageal reflux disease without esophagitis: Secondary | ICD-10-CM | POA: Diagnosis not present

## 2021-09-16 DIAGNOSIS — K7581 Nonalcoholic steatohepatitis (NASH): Secondary | ICD-10-CM | POA: Diagnosis not present

## 2021-09-16 DIAGNOSIS — M5117 Intervertebral disc disorders with radiculopathy, lumbosacral region: Secondary | ICD-10-CM | POA: Diagnosis not present

## 2021-09-16 DIAGNOSIS — I10 Essential (primary) hypertension: Secondary | ICD-10-CM | POA: Diagnosis not present

## 2021-09-16 DIAGNOSIS — Z87442 Personal history of urinary calculi: Secondary | ICD-10-CM | POA: Diagnosis not present

## 2021-09-16 DIAGNOSIS — M7061 Trochanteric bursitis, right hip: Secondary | ICD-10-CM | POA: Diagnosis not present

## 2021-09-26 DIAGNOSIS — M48062 Spinal stenosis, lumbar region with neurogenic claudication: Secondary | ICD-10-CM | POA: Diagnosis not present

## 2021-09-30 ENCOUNTER — Other Ambulatory Visit (INDEPENDENT_AMBULATORY_CARE_PROVIDER_SITE_OTHER): Payer: Medicare HMO | Admitting: Internal Medicine

## 2021-09-30 DIAGNOSIS — I10 Essential (primary) hypertension: Secondary | ICD-10-CM | POA: Diagnosis not present

## 2021-09-30 DIAGNOSIS — K219 Gastro-esophageal reflux disease without esophagitis: Secondary | ICD-10-CM | POA: Diagnosis not present

## 2021-09-30 DIAGNOSIS — R188 Other ascites: Secondary | ICD-10-CM

## 2021-09-30 DIAGNOSIS — M7061 Trochanteric bursitis, right hip: Secondary | ICD-10-CM

## 2021-09-30 DIAGNOSIS — M5117 Intervertebral disc disorders with radiculopathy, lumbosacral region: Secondary | ICD-10-CM | POA: Diagnosis not present

## 2021-09-30 DIAGNOSIS — K7581 Nonalcoholic steatohepatitis (NASH): Secondary | ICD-10-CM | POA: Diagnosis not present

## 2021-09-30 DIAGNOSIS — K7460 Unspecified cirrhosis of liver: Secondary | ICD-10-CM

## 2021-09-30 DIAGNOSIS — Z87442 Personal history of urinary calculi: Secondary | ICD-10-CM | POA: Diagnosis not present

## 2021-09-30 NOTE — Progress Notes (Signed)
Received home health orders orders from [facility]. Start of care 10/31/20.   Certification and orders from 08/27/21 through 10/25/21 are reviewed, signed and faxed back to home health company.  Need of intermittent skilled services at home: home bound  The home health care plan has been established by me and will be reviewed and updated as needed to maximize patient recovery.  I certify that all home health services have been and will be furnished to the patient while under my care.  Face-to-face encounter in which the need for home health services was established: 06/10/21.  Patient is receiving home health services for the following diagnoses: Problem List Items Addressed This Visit       Digestive   Acid reflux (Chronic)   Cirrhosis of liver with ascites (HCC) (Chronic)     Musculoskeletal and Integument   Bursitis, trochanteric   Other Visit Diagnoses     Intervertebral disc disorder with radiculopathy of lumbosacral region    -  Primary   Essential hypertension, benign       Nonalcoholic steatohepatitis       Personal history of urinary calculi            Halina Maidens, MD

## 2021-10-07 DIAGNOSIS — K7460 Unspecified cirrhosis of liver: Secondary | ICD-10-CM | POA: Diagnosis not present

## 2021-10-07 DIAGNOSIS — M7061 Trochanteric bursitis, right hip: Secondary | ICD-10-CM | POA: Diagnosis not present

## 2021-10-07 DIAGNOSIS — K7581 Nonalcoholic steatohepatitis (NASH): Secondary | ICD-10-CM | POA: Diagnosis not present

## 2021-10-07 DIAGNOSIS — M5117 Intervertebral disc disorders with radiculopathy, lumbosacral region: Secondary | ICD-10-CM | POA: Diagnosis not present

## 2021-10-07 DIAGNOSIS — I10 Essential (primary) hypertension: Secondary | ICD-10-CM | POA: Diagnosis not present

## 2021-10-07 DIAGNOSIS — Z87442 Personal history of urinary calculi: Secondary | ICD-10-CM | POA: Diagnosis not present

## 2021-10-07 DIAGNOSIS — K219 Gastro-esophageal reflux disease without esophagitis: Secondary | ICD-10-CM | POA: Diagnosis not present

## 2021-10-13 DIAGNOSIS — M48062 Spinal stenosis, lumbar region with neurogenic claudication: Secondary | ICD-10-CM | POA: Diagnosis not present

## 2021-10-14 DIAGNOSIS — Z87442 Personal history of urinary calculi: Secondary | ICD-10-CM | POA: Diagnosis not present

## 2021-10-14 DIAGNOSIS — M5117 Intervertebral disc disorders with radiculopathy, lumbosacral region: Secondary | ICD-10-CM | POA: Diagnosis not present

## 2021-10-14 DIAGNOSIS — K7460 Unspecified cirrhosis of liver: Secondary | ICD-10-CM | POA: Diagnosis not present

## 2021-10-14 DIAGNOSIS — K219 Gastro-esophageal reflux disease without esophagitis: Secondary | ICD-10-CM | POA: Diagnosis not present

## 2021-10-14 DIAGNOSIS — K7581 Nonalcoholic steatohepatitis (NASH): Secondary | ICD-10-CM | POA: Diagnosis not present

## 2021-10-14 DIAGNOSIS — M7061 Trochanteric bursitis, right hip: Secondary | ICD-10-CM | POA: Diagnosis not present

## 2021-10-14 DIAGNOSIS — I10 Essential (primary) hypertension: Secondary | ICD-10-CM | POA: Diagnosis not present

## 2021-10-21 DIAGNOSIS — M5117 Intervertebral disc disorders with radiculopathy, lumbosacral region: Secondary | ICD-10-CM | POA: Diagnosis not present

## 2021-10-21 DIAGNOSIS — K7581 Nonalcoholic steatohepatitis (NASH): Secondary | ICD-10-CM | POA: Diagnosis not present

## 2021-10-21 DIAGNOSIS — K219 Gastro-esophageal reflux disease without esophagitis: Secondary | ICD-10-CM | POA: Diagnosis not present

## 2021-10-21 DIAGNOSIS — K7460 Unspecified cirrhosis of liver: Secondary | ICD-10-CM | POA: Diagnosis not present

## 2021-10-21 DIAGNOSIS — I10 Essential (primary) hypertension: Secondary | ICD-10-CM | POA: Diagnosis not present

## 2021-10-21 DIAGNOSIS — M7061 Trochanteric bursitis, right hip: Secondary | ICD-10-CM | POA: Diagnosis not present

## 2021-10-21 DIAGNOSIS — Z87442 Personal history of urinary calculi: Secondary | ICD-10-CM | POA: Diagnosis not present

## 2021-10-24 ENCOUNTER — Other Ambulatory Visit (INDEPENDENT_AMBULATORY_CARE_PROVIDER_SITE_OTHER): Payer: Medicare HMO | Admitting: Internal Medicine

## 2021-10-24 DIAGNOSIS — I10 Essential (primary) hypertension: Secondary | ICD-10-CM

## 2021-10-24 DIAGNOSIS — M5117 Intervertebral disc disorders with radiculopathy, lumbosacral region: Secondary | ICD-10-CM | POA: Diagnosis not present

## 2021-10-24 DIAGNOSIS — Z87442 Personal history of urinary calculi: Secondary | ICD-10-CM

## 2021-10-24 DIAGNOSIS — K7460 Unspecified cirrhosis of liver: Secondary | ICD-10-CM

## 2021-10-24 DIAGNOSIS — K7581 Nonalcoholic steatohepatitis (NASH): Secondary | ICD-10-CM

## 2021-10-24 DIAGNOSIS — K219 Gastro-esophageal reflux disease without esophagitis: Secondary | ICD-10-CM

## 2021-10-24 DIAGNOSIS — M7061 Trochanteric bursitis, right hip: Secondary | ICD-10-CM

## 2021-10-24 DIAGNOSIS — R188 Other ascites: Secondary | ICD-10-CM

## 2021-10-24 NOTE — Progress Notes (Signed)
Received home health orders orders from [facility]. Start of care 10/31/20.   Certification and orders from 10/26/21 through 12/24/21 are reviewed, signed and faxed back to home health company.  Need of intermittent skilled services at home: home bound  The home health care plan has been established by me and will be reviewed and updated as needed to maximize patient recovery.  I certify that all home health services have been and will be furnished to the patient while under my care.  Face-to-face encounter in which the need for home health services was established: 06/10/21  Patient is receiving home health services for the following diagnoses: Problem List Items Addressed This Visit       Digestive   Acid reflux (Chronic)   Cirrhosis of liver with ascites (HCC) (Chronic)     Musculoskeletal and Integument   Bursitis, trochanteric   Other Visit Diagnoses     Intervertebral disc disorder with radiculopathy of lumbosacral region    -  Primary   Essential hypertension, benign       Nonalcoholic steatohepatitis       Personal history of urinary calculi            Halina Maidens, MD

## 2021-10-28 DIAGNOSIS — K7581 Nonalcoholic steatohepatitis (NASH): Secondary | ICD-10-CM | POA: Diagnosis not present

## 2021-10-28 DIAGNOSIS — M7061 Trochanteric bursitis, right hip: Secondary | ICD-10-CM | POA: Diagnosis not present

## 2021-10-28 DIAGNOSIS — Z87442 Personal history of urinary calculi: Secondary | ICD-10-CM | POA: Diagnosis not present

## 2021-10-28 DIAGNOSIS — I10 Essential (primary) hypertension: Secondary | ICD-10-CM | POA: Diagnosis not present

## 2021-10-28 DIAGNOSIS — K7460 Unspecified cirrhosis of liver: Secondary | ICD-10-CM | POA: Diagnosis not present

## 2021-10-28 DIAGNOSIS — K219 Gastro-esophageal reflux disease without esophagitis: Secondary | ICD-10-CM | POA: Diagnosis not present

## 2021-10-28 DIAGNOSIS — M5117 Intervertebral disc disorders with radiculopathy, lumbosacral region: Secondary | ICD-10-CM | POA: Diagnosis not present

## 2021-11-04 DIAGNOSIS — M5117 Intervertebral disc disorders with radiculopathy, lumbosacral region: Secondary | ICD-10-CM | POA: Diagnosis not present

## 2021-11-04 DIAGNOSIS — I10 Essential (primary) hypertension: Secondary | ICD-10-CM | POA: Diagnosis not present

## 2021-11-04 DIAGNOSIS — K7460 Unspecified cirrhosis of liver: Secondary | ICD-10-CM | POA: Diagnosis not present

## 2021-11-04 DIAGNOSIS — K7581 Nonalcoholic steatohepatitis (NASH): Secondary | ICD-10-CM | POA: Diagnosis not present

## 2021-11-04 DIAGNOSIS — Z87442 Personal history of urinary calculi: Secondary | ICD-10-CM | POA: Diagnosis not present

## 2021-11-04 DIAGNOSIS — K219 Gastro-esophageal reflux disease without esophagitis: Secondary | ICD-10-CM | POA: Diagnosis not present

## 2021-11-04 DIAGNOSIS — M7061 Trochanteric bursitis, right hip: Secondary | ICD-10-CM | POA: Diagnosis not present

## 2021-11-11 DIAGNOSIS — I10 Essential (primary) hypertension: Secondary | ICD-10-CM | POA: Diagnosis not present

## 2021-11-11 DIAGNOSIS — K219 Gastro-esophageal reflux disease without esophagitis: Secondary | ICD-10-CM | POA: Diagnosis not present

## 2021-11-11 DIAGNOSIS — M7061 Trochanteric bursitis, right hip: Secondary | ICD-10-CM | POA: Diagnosis not present

## 2021-11-11 DIAGNOSIS — K7581 Nonalcoholic steatohepatitis (NASH): Secondary | ICD-10-CM | POA: Diagnosis not present

## 2021-11-11 DIAGNOSIS — K7460 Unspecified cirrhosis of liver: Secondary | ICD-10-CM | POA: Diagnosis not present

## 2021-11-11 DIAGNOSIS — Z87442 Personal history of urinary calculi: Secondary | ICD-10-CM | POA: Diagnosis not present

## 2021-11-11 DIAGNOSIS — M5117 Intervertebral disc disorders with radiculopathy, lumbosacral region: Secondary | ICD-10-CM | POA: Diagnosis not present

## 2021-11-18 DIAGNOSIS — Z87442 Personal history of urinary calculi: Secondary | ICD-10-CM | POA: Diagnosis not present

## 2021-11-18 DIAGNOSIS — I10 Essential (primary) hypertension: Secondary | ICD-10-CM | POA: Diagnosis not present

## 2021-11-18 DIAGNOSIS — K219 Gastro-esophageal reflux disease without esophagitis: Secondary | ICD-10-CM | POA: Diagnosis not present

## 2021-11-18 DIAGNOSIS — M5117 Intervertebral disc disorders with radiculopathy, lumbosacral region: Secondary | ICD-10-CM | POA: Diagnosis not present

## 2021-11-18 DIAGNOSIS — M7061 Trochanteric bursitis, right hip: Secondary | ICD-10-CM | POA: Diagnosis not present

## 2021-11-18 DIAGNOSIS — K7460 Unspecified cirrhosis of liver: Secondary | ICD-10-CM | POA: Diagnosis not present

## 2021-11-18 DIAGNOSIS — K7581 Nonalcoholic steatohepatitis (NASH): Secondary | ICD-10-CM | POA: Diagnosis not present

## 2021-11-25 DIAGNOSIS — M5117 Intervertebral disc disorders with radiculopathy, lumbosacral region: Secondary | ICD-10-CM | POA: Diagnosis not present

## 2021-11-25 DIAGNOSIS — M7061 Trochanteric bursitis, right hip: Secondary | ICD-10-CM | POA: Diagnosis not present

## 2021-11-25 DIAGNOSIS — K7581 Nonalcoholic steatohepatitis (NASH): Secondary | ICD-10-CM | POA: Diagnosis not present

## 2021-11-25 DIAGNOSIS — I10 Essential (primary) hypertension: Secondary | ICD-10-CM | POA: Diagnosis not present

## 2021-11-25 DIAGNOSIS — K219 Gastro-esophageal reflux disease without esophagitis: Secondary | ICD-10-CM | POA: Diagnosis not present

## 2021-11-25 DIAGNOSIS — Z87442 Personal history of urinary calculi: Secondary | ICD-10-CM | POA: Diagnosis not present

## 2021-11-25 DIAGNOSIS — K7460 Unspecified cirrhosis of liver: Secondary | ICD-10-CM | POA: Diagnosis not present

## 2021-12-09 DIAGNOSIS — I10 Essential (primary) hypertension: Secondary | ICD-10-CM | POA: Diagnosis not present

## 2021-12-09 DIAGNOSIS — M5117 Intervertebral disc disorders with radiculopathy, lumbosacral region: Secondary | ICD-10-CM | POA: Diagnosis not present

## 2021-12-09 DIAGNOSIS — Z87442 Personal history of urinary calculi: Secondary | ICD-10-CM | POA: Diagnosis not present

## 2021-12-09 DIAGNOSIS — K7581 Nonalcoholic steatohepatitis (NASH): Secondary | ICD-10-CM | POA: Diagnosis not present

## 2021-12-09 DIAGNOSIS — K219 Gastro-esophageal reflux disease without esophagitis: Secondary | ICD-10-CM | POA: Diagnosis not present

## 2021-12-09 DIAGNOSIS — M7061 Trochanteric bursitis, right hip: Secondary | ICD-10-CM | POA: Diagnosis not present

## 2021-12-09 DIAGNOSIS — K7460 Unspecified cirrhosis of liver: Secondary | ICD-10-CM | POA: Diagnosis not present

## 2021-12-16 DIAGNOSIS — M65322 Trigger finger, left index finger: Secondary | ICD-10-CM | POA: Diagnosis not present

## 2021-12-18 DIAGNOSIS — M5117 Intervertebral disc disorders with radiculopathy, lumbosacral region: Secondary | ICD-10-CM | POA: Diagnosis not present

## 2021-12-18 DIAGNOSIS — K219 Gastro-esophageal reflux disease without esophagitis: Secondary | ICD-10-CM | POA: Diagnosis not present

## 2021-12-18 DIAGNOSIS — I10 Essential (primary) hypertension: Secondary | ICD-10-CM | POA: Diagnosis not present

## 2021-12-18 DIAGNOSIS — K7460 Unspecified cirrhosis of liver: Secondary | ICD-10-CM | POA: Diagnosis not present

## 2021-12-18 DIAGNOSIS — K7581 Nonalcoholic steatohepatitis (NASH): Secondary | ICD-10-CM | POA: Diagnosis not present

## 2021-12-18 DIAGNOSIS — M7061 Trochanteric bursitis, right hip: Secondary | ICD-10-CM | POA: Diagnosis not present

## 2021-12-18 DIAGNOSIS — Z87442 Personal history of urinary calculi: Secondary | ICD-10-CM | POA: Diagnosis not present

## 2021-12-23 DIAGNOSIS — M5117 Intervertebral disc disorders with radiculopathy, lumbosacral region: Secondary | ICD-10-CM | POA: Diagnosis not present

## 2021-12-23 DIAGNOSIS — K7581 Nonalcoholic steatohepatitis (NASH): Secondary | ICD-10-CM | POA: Diagnosis not present

## 2021-12-23 DIAGNOSIS — Z87442 Personal history of urinary calculi: Secondary | ICD-10-CM | POA: Diagnosis not present

## 2021-12-23 DIAGNOSIS — I10 Essential (primary) hypertension: Secondary | ICD-10-CM | POA: Diagnosis not present

## 2021-12-23 DIAGNOSIS — K7460 Unspecified cirrhosis of liver: Secondary | ICD-10-CM | POA: Diagnosis not present

## 2021-12-23 DIAGNOSIS — M7061 Trochanteric bursitis, right hip: Secondary | ICD-10-CM | POA: Diagnosis not present

## 2021-12-23 DIAGNOSIS — K219 Gastro-esophageal reflux disease without esophagitis: Secondary | ICD-10-CM | POA: Diagnosis not present

## 2022-02-02 ENCOUNTER — Telehealth: Payer: Self-pay

## 2022-02-02 ENCOUNTER — Telehealth: Payer: Self-pay | Admitting: Gastroenterology

## 2022-02-02 DIAGNOSIS — R188 Other ascites: Secondary | ICD-10-CM

## 2022-02-02 NOTE — Telephone Encounter (Signed)
-----   Message from [place] sent at 08/04/2021  9:47 AM EDT ----- Regarding: 6 mth 6 mth [place] and appt

## 2022-02-02 NOTE — Telephone Encounter (Signed)
Patients daughter has a few questions in reference to the patients upcoming appointments scheduled with Threasa Beards. Daughter requests a call back.

## 2022-02-02 NOTE — Telephone Encounter (Signed)
Pt is aware of [place] and AFP due... Pt requested that it be scheduled for after the holidays  Scheduled for 02/24/2022 and pt would like to f/u, appt scheduled for 05/2022  Reminders for both mailed to pt per her request

## 2022-02-04 NOTE — Telephone Encounter (Signed)
Left message on voicemail.

## 2022-02-13 NOTE — Telephone Encounter (Signed)
Left message on voicemail.

## 2022-02-18 NOTE — Telephone Encounter (Signed)
Left message on voicemail.

## 2022-02-24 ENCOUNTER — Ambulatory Visit: Payer: Medicare HMO

## 2022-02-25 ENCOUNTER — Other Ambulatory Visit: Payer: Self-pay | Admitting: Gastroenterology

## 2022-02-25 DIAGNOSIS — K7460 Unspecified cirrhosis of liver: Secondary | ICD-10-CM

## 2022-03-24 ENCOUNTER — Ambulatory Visit: Payer: Medicare HMO

## 2022-04-14 ENCOUNTER — Ambulatory Visit
Admission: RE | Admit: 2022-04-14 | Discharge: 2022-04-14 | Disposition: A | Discharge status: Home / Self Care | Payer: Medicare HMO | Source: Ambulatory Visit | Attending: Gastroenterology | Admitting: Gastroenterology

## 2022-04-14 DIAGNOSIS — R188 Other ascites: Secondary | ICD-10-CM | POA: Diagnosis not present

## 2022-04-14 DIAGNOSIS — K7460 Unspecified cirrhosis of liver: Secondary | ICD-10-CM | POA: Diagnosis not present

## 2022-05-25 ENCOUNTER — Other Ambulatory Visit: Payer: Self-pay | Admitting: Gastroenterology

## 2022-05-25 DIAGNOSIS — R188 Other ascites: Secondary | ICD-10-CM

## 2022-05-26 ENCOUNTER — Ambulatory Visit: Payer: Medicare HMO | Admitting: Gastroenterology

## 2022-06-01 DIAGNOSIS — M48062 Spinal stenosis, lumbar region with neurogenic claudication: Secondary | ICD-10-CM | POA: Diagnosis not present

## 2022-06-25 ENCOUNTER — Telehealth: Payer: Medicare HMO | Admitting: Gastroenterology

## 2022-06-30 ENCOUNTER — Encounter: Payer: Self-pay | Admitting: Gastroenterology

## 2022-06-30 ENCOUNTER — Telehealth (INDEPENDENT_AMBULATORY_CARE_PROVIDER_SITE_OTHER): Payer: Medicare HMO | Admitting: Gastroenterology

## 2022-06-30 VITALS — Wt 142.0 lb

## 2022-06-30 DIAGNOSIS — R188 Other ascites: Secondary | ICD-10-CM | POA: Diagnosis not present

## 2022-06-30 DIAGNOSIS — K7460 Unspecified cirrhosis of liver: Secondary | ICD-10-CM

## 2022-06-30 DIAGNOSIS — M48062 Spinal stenosis, lumbar region with neurogenic claudication: Secondary | ICD-10-CM | POA: Diagnosis not present

## 2022-06-30 DIAGNOSIS — M5441 Lumbago with sciatica, right side: Secondary | ICD-10-CM | POA: Diagnosis not present

## 2022-06-30 MED ORDER — NADOLOL 20 MG PO TABS: ORAL_TABLET | ORAL | 3 refills | Status: DC

## 2022-06-30 NOTE — Progress Notes (Signed)
Jenna Minium, MD [address]  [address]  [place], [place] [address]  Main: [phone]  Fax: [phone]    Gastroenterology Virtual/Video Visit  Referring Provider:     Reubin Milan, MD Primary Care Physician:  Reubin Milan, MD Primary Gastroenterologist:  Dr.Chamaine Stankus Servando Snare Reason for Consultation:     Follow-up of cirrhosis        HPI:    Virtual Visit via Video Note Location of the patient: Home Location of provider: Office Participating persons: The patient and myself.  I connected with Jenna Franklin on 06/30/22 at  2:45 PM EDT by a video enabled telemedicine application and verified that I am speaking with the correct person using two identifiers.   I discussed the limitations of evaluation and management by telemedicine and the availability of in person appointments. The patient expressed understanding and agreed to proceed.  Verbal consent to proceed obtained.  History of Present Illness: See [name] is a 82 y.o. female referred by Dr. Judithann Graves, Nyoka Cowden, MD  for consultation & management of cirrhosis.  The patient has been doing well without any complaints except that she states that she gets up in the melanite to urinate frequently.  On further questioning it turns out that she only gets up once a night.  She thinks it may be related to the Lasix but she takes 40 mg of Lasix in the morning.  She denies any abdominal swelling or extremity swelling.  The patient had a ultrasound of her liver without any masses seen but she has not had a recent alpha-fetoprotein.  The patient denies any other issues at the present time.  Past Medical History:  Diagnosis Date   Adjustment disorder with anxious mood 10/05/2013   Anxiety    Arthritis    back   Bronchitis    currently   Calculus of kidney 03/25/2015   CHF (congestive heart failure) (HCC)    pt says she thinks she has this   Cirrhosis of liver (HCC) 2016   Depression    Esophageal varices (HCC)     Esophageal varices (HCC)    Gastritis without bleeding 2019   GERD (gastroesophageal reflux disease)    H/O: depression    History of kidney stones    Hx of sinus tachycardia 07/06/2017   Hypertension    Nerve root pain 12/11/2011   Non-alcoholic fatty liver disease    Nonalcoholic fatty liver disease    Osteopenia    Osteopenia    Reflux    in past   Spinal stenosis    Tachycardia 03/25/2015   Tendinopathy of right gluteus medius 01/01/2017   Vertigo    3 yrs ago   Vitamin D deficiency    Vitamin D deficiency     Past Surgical History:  Procedure Laterality Date   ABDOMINAL HYSTERECTOMY     APPENDECTOMY     BREAST BIOPSY Right    neg needle bx   CATARACT EXTRACTION W/PHACO Right 01/09/2019   Procedure: CATARACT EXTRACTION PHACO AND INTRAOCULAR LENS PLACEMENT (IOC) RIGHT 8.60,        00:53.3;  Surgeon: Nevada Crane, MD;  Location: [facility];  Service: Ophthalmology;  Laterality: Right;   CATARACT EXTRACTION W/PHACO Left 01/30/2019   Procedure: CATARACT EXTRACTION PHACO AND INTRAOCULAR LENS PLACEMENT (IOC) LEFT, 2.59, 00:25.5;  Surgeon: Nevada Crane, MD;  Location: [facility];  Service: Ophthalmology;  Laterality: Left;   COMBINED HYSTERECTOMY ABDOMINAL W/ A&P REPAIR /  OOPHORECTOMY     ESOPHAGOGASTRODUODENOSCOPY (EGD) WITH PROPOFOL N/A 06/17/2015   Procedure: ESOPHAGOGASTRODUODENOSCOPY (EGD) WITH PROPOFOL;  Surgeon: Jenna Minium, MD;  Location: [facility];  Service: Endoscopy;  Laterality: N/A;   ESOPHAGOGASTRODUODENOSCOPY (EGD) WITH PROPOFOL N/A 06/17/2017   Procedure: ESOPHAGOGASTRODUODENOSCOPY (EGD) WITH PROPOFOL;  Surgeon: Jenna Minium, MD;  Location: [facility];  Service: Endoscopy;  Laterality: N/A;   LAPAROSCOPIC CHOLECYSTECTOMY     TONSILLECTOMY      Prior to Admission medications   Medication Sig Start Date End Date Taking? Authorizing Provider  fluticasone (FLONASE) 50 MCG/ACT nasal spray Place 2 sprays into both  nostrils daily. 10/08/15  Yes Reubin Milan, MD  furosemide (LASIX) 40 MG tablet TAKE ONE (1) TABLET BY MOUTH ONCE DAILY 08/05/21  Yes Jenna Minium, MD  nadolol (CORGARD) 20 MG tablet TAKE (1) TABLET BY MOUTH EVERY DAY 05/25/22  Yes Jenna Minium, MD    Family History  Problem Relation Age of Onset   Heart disease Mother    Heart disease Father    Heart attack Paternal Grandfather    Asthma Son 64   Hypertension Daughter    Breast cancer Maternal Aunt      Social History   Tobacco Use   Smoking status: Never   Smokeless tobacco: Never   Tobacco comments:    smoking cessation materials not required  Vaping Use   Vaping Use: Never used  Substance Use Topics   Alcohol use: No    Alcohol/week: 0.0 standard drinks of alcohol   Drug use: No    Allergies as of 06/30/2022 - Review Complete 06/30/2022  Allergen Reaction Noted   Baclofen Anxiety 02/18/2016   Codeine Nausea And Vomiting 09/28/2013   Sulfa antibiotics Nausea Only 01/19/2019   Prednisone Anxiety 11/16/2016    Review of Systems:    All systems reviewed and negative except where noted in HPI.   Observations/Objective:  Labs: CBC    Component Value Date/Time   WBC 4.1 06/10/2021 1520   WBC 4.7 08/19/2020 1407   RBC 4.10 06/10/2021 1520   RBC 4.17 08/19/2020 1407   HGB 14.3 06/10/2021 1520   HCT 40.2 06/10/2021 1520   PLT 93 (LL) 06/10/2021 1520   MCV 98 (H) 06/10/2021 1520   MCH 34.9 (H) 06/10/2021 1520   MCH 34.3 (H) 08/19/2020 1407   MCHC 35.6 06/10/2021 1520   MCHC 34.8 08/19/2020 1407   RDW 12.2 06/10/2021 1520   LYMPHSABS 1.3 06/10/2021 1520   MONOABS 0.5 08/19/2020 1407   EOSABS 0.2 06/10/2021 1520   BASOSABS 0.0 06/10/2021 1520   CMP     Component Value Date/Time   NA 141 06/10/2021 1520   K 4.2 06/10/2021 1520   CL 103 06/10/2021 1520   CO2 24 06/10/2021 1520   GLUCOSE 98 06/10/2021 1520   GLUCOSE 161 (H) 08/19/2020 1407   BUN 16 06/10/2021 1520   CREATININE 0.87 06/10/2021 1520    CALCIUM 9.6 06/10/2021 1520   PROT 6.8 06/10/2021 1520   ALBUMIN 4.5 06/10/2021 1520   AST 59 (H) 06/10/2021 1520   ALT 59 (H) 06/10/2021 1520   ALKPHOS 104 06/10/2021 1520   BILITOT 0.9 06/10/2021 1520   GFRNONAA >60 08/19/2020 1407   GFRAA >60 09/06/2019 0853    Imaging Studies: No results found.  Assessment and Plan:   Jenna Franklin is a 82 y.o. y/o female here for a history of cirrhosis.  The patient has been doing well except that she reports that she  has frequent urinations at night which usually accounts for only 1 trip to the bathroom every night.  The patient has been told that she can try to avoid the Lasix and see if she has any improvement.  She has also been told that she should watch for any extremity or abdominal swelling.  The patient will be monitored with an alpha-fetoprotein and right upper quadrant ultrasound every 6 months for hepatocellular carcinoma.  She will also have a fetoprotein check now since it to be in some time since she had it checked last.  The patient and her daughter have been explained the plan and agree with it.  Follow Up Instructions:  I discussed the assessment and treatment plan with the patient. The patient was provided an opportunity to ask questions and all were answered. The patient agreed with the plan and demonstrated an understanding of the instructions.   The patient was advised to call back or seek an in-person evaluation if the symptoms worsen or if the condition fails to improve as anticipated.  I provided 25 minutes of non-face-to-face time during this encounter including chart review In preparation for the encounter.   Jenna Minium, MD  Speech recognition software was used to dictate the above note.

## 2022-06-30 NOTE — Addendum Note (Signed)
Addended by: Roena Malady on: 06/30/2022 04:02 PM   Modules accepted: Orders

## 2022-07-01 ENCOUNTER — Telehealth: Payer: Medicare HMO | Admitting: Gastroenterology

## 2022-08-12 ENCOUNTER — Ambulatory Visit (INDEPENDENT_AMBULATORY_CARE_PROVIDER_SITE_OTHER): Payer: Medicare HMO

## 2022-08-12 VITALS — Wt 142.0 lb

## 2022-08-12 DIAGNOSIS — Z0000 Encounter for general adult medical examination without abnormal findings: Secondary | ICD-10-CM

## 2022-08-12 NOTE — Patient Instructions (Signed)
Jenna Franklin , Thank you for taking time to come for your Medicare Wellness Visit. I appreciate your ongoing commitment to your health goals. Please review the following plan we discussed and let me know if I can assist you in the future.   These are the goals we discussed:  Goals      DIET - EAT MORE FRUITS AND VEGETABLES     Increase water intake     Recommend to increase fluid intake to 4-6 glasses every day        This is a list of the screening recommended for you and due dates:  Health Maintenance  Topic Date Due   Zoster (Shingles) Vaccine (1 of 2) Never done   COVID-19 Vaccine (3 - 2023-24 season) 10/17/2021   Flu Shot  09/17/2022   Medicare Annual Wellness Visit  08/12/2023   DTaP/Tdap/Td vaccine (3 - Td or Tdap) 08/20/2030   Pneumonia Vaccine  Completed   DEXA scan (bone density measurement)  Completed   HPV Vaccine  Aged Out   Hepatitis C Screening  Discontinued    Advanced directives: no  Conditions/risks identified: none  Next appointment: Follow up in one year for your annual wellness visit 08/18/23 @ 9:15 am by phone   Preventive Care 65 Years and Older, Female Preventive care refers to lifestyle choices and visits with your health care provider that can promote health and wellness. What does preventive care include? A yearly physical exam. This is also called an annual well check. Dental exams once or twice a year. Routine eye exams. Ask your health care provider how often you should have your eyes checked. Personal lifestyle choices, including: Daily care of your teeth and gums. Regular physical activity. Eating a healthy diet. Avoiding tobacco and drug use. Limiting alcohol use. Practicing safe sex. Taking low-dose aspirin every day. Taking vitamin and mineral supplements as recommended by your health care provider. What happens during an annual well check? The services and screenings done by your health care provider during your annual well check will  depend on your age, overall health, lifestyle risk factors, and family history of disease. Counseling  Your health care provider may ask you questions about your: Alcohol use. Tobacco use. Drug use. Emotional well-being. Home and relationship well-being. Sexual activity. Eating habits. History of falls. Memory and ability to understand (cognition). Work and work Astronomer. Reproductive health. Screening  You may have the following tests or measurements: Height, weight, and BMI. Blood pressure. Lipid and cholesterol levels. These may be checked every 5 years, or more frequently if you are over 70 years old. Skin check. Lung cancer screening. You may have this screening every year starting at age 62 if you have a 30-pack-year history of smoking and currently smoke or have quit within the past 15 years. Fecal occult blood test (FOBT) of the stool. You may have this test every year starting at age 3. Flexible sigmoidoscopy or colonoscopy. You may have a sigmoidoscopy every 5 years or a colonoscopy every 10 years starting at age 48. Hepatitis C blood test. Hepatitis B blood test. Sexually transmitted disease (STD) testing. Diabetes screening. This is done by checking your blood sugar (glucose) after you have not eaten for a while (fasting). You may have this done every 1-3 years. Bone density scan. This is done to screen for osteoporosis. You may have this done starting at age 31. Mammogram. This may be done every 1-2 years. Talk to your health care provider about how often you  should have regular mammograms. Talk with your health care provider about your test results, treatment options, and if necessary, the need for more tests. Vaccines  Your health care provider may recommend certain vaccines, such as: Influenza vaccine. This is recommended every year. Tetanus, diphtheria, and acellular pertussis (Tdap, Td) vaccine. You may need a Td booster every 10 years. Zoster vaccine. You may  need this after age 28. Pneumococcal 13-valent conjugate (PCV13) vaccine. One dose is recommended after age 82. Pneumococcal polysaccharide (PPSV23) vaccine. One dose is recommended after age 69. Talk to your health care provider about which screenings and vaccines you need and how often you need them. This information is not intended to replace advice given to you by your health care provider. Make sure you discuss any questions you have with your health care provider. Document Released: 03/01/2015 Document Revised: 10/23/2015 Document Reviewed: 12/04/2014 Elsevier Interactive Patient Education  2017 [place] Prevention in the Home Falls can cause injuries. They can happen to people of all ages. There are many things you can do to make your home safe and to help prevent falls. What can I do on the outside of my home? Regularly fix the edges of walkways and driveways and fix any cracks. Remove anything that might make you trip as you walk through a door, such as a raised step or threshold. Trim any bushes or trees on the path to your home. Use bright outdoor lighting. Clear any walking paths of anything that might make someone trip, such as rocks or tools. Regularly check to see if handrails are loose or broken. Make sure that both sides of any steps have handrails. Any raised decks and porches should have guardrails on the edges. Have any leaves, snow, or ice cleared regularly. Use sand or salt on walking paths during winter. Clean up any spills in your garage right away. This includes oil or grease spills. What can I do in the bathroom? Use night lights. Install grab bars by the toilet and in the tub and shower. Do not use towel bars as grab bars. Use non-skid mats or decals in the tub or shower. If you need to sit down in the shower, use a plastic, non-slip stool. Keep the floor dry. Clean up any water that spills on the floor as soon as it happens. Remove soap buildup in  the tub or shower regularly. Attach bath mats securely with double-sided non-slip rug tape. Do not have throw rugs and other things on the floor that can make you trip. What can I do in the bedroom? Use night lights. Make sure that you have a light by your bed that is easy to reach. Do not use any sheets or blankets that are too big for your bed. They should not hang down onto the floor. Have a firm chair that has side arms. You can use this for support while you get dressed. Do not have throw rugs and other things on the floor that can make you trip. What can I do in the kitchen? Clean up any spills right away. Avoid walking on wet floors. Keep items that you use a lot in easy-to-reach places. If you need to reach something above you, use a strong step stool that has a grab bar. Keep electrical cords out of the way. Do not use floor polish or wax that makes floors slippery. If you must use wax, use non-skid floor wax. Do not have throw rugs and other things on the  floor that can make you trip. What can I do with my stairs? Do not leave any items on the stairs. Make sure that there are handrails on both sides of the stairs and use them. Fix handrails that are broken or loose. Make sure that handrails are as long as the stairways. Check any carpeting to make sure that it is firmly attached to the stairs. Fix any carpet that is loose or worn. Avoid having throw rugs at the top or bottom of the stairs. If you do have throw rugs, attach them to the floor with carpet tape. Make sure that you have a light switch at the top of the stairs and the bottom of the stairs. If you do not have them, ask someone to add them for you. What else can I do to help prevent falls? Wear shoes that: Do not have high heels. Have rubber bottoms. Are comfortable and fit you well. Are closed at the toe. Do not wear sandals. If you use a stepladder: Make sure that it is fully opened. Do not climb a closed  stepladder. Make sure that both sides of the stepladder are locked into place. Ask someone to hold it for you, if possible. Clearly mark and make sure that you can see: Any grab bars or handrails. First and last steps. Where the edge of each step is. Use tools that help you move around (mobility aids) if they are needed. These include: Canes. Walkers. Scooters. Crutches. Turn on the lights when you go into a dark area. Replace any light bulbs as soon as they burn out. Set up your furniture so you have a clear path. Avoid moving your furniture around. If any of your floors are uneven, fix them. If there are any pets around you, be aware of where they are. Review your medicines with your doctor. Some medicines can make you feel dizzy. This can increase your chance of falling. Ask your doctor what other things that you can do to help prevent falls. This information is not intended to replace advice given to you by your health care provider. Make sure you discuss any questions you have with your health care provider. Document Released: 11/29/2008 Document Revised: 07/11/2015 Document Reviewed: 03/09/2014 Elsevier Interactive Patient Education  2017 [employer].

## 2022-08-12 NOTE — Progress Notes (Signed)
Subjective:   Jenna Franklin is a 82 y.o. female who presents for Medicare Annual (Subsequent) preventive examination.  Visit Complete: Virtual  I connected with  Jenna Franklin on 08/12/22 by a audio enabled telemedicine application and verified that I am speaking with the correct person using two identifiers.  Patient Location: Home  Provider Location: Office/Clinic  I discussed the limitations of evaluation and management by telemedicine. The patient expressed understanding and agreed to proceed.  Review of Systems     Cardiac Risk Factors include: sedentary lifestyle;hypertension;advanced age (>82men, >8 women)     Objective:    Today's Vitals   08/12/22 1002 08/12/22 1019  Weight:  142 lb (64.4 kg)  PainSc: 5     Body mass index is 25.15 kg/m.     08/12/2022   10:07 AM 07/30/2021   10:39 AM 08/19/2020    2:02 PM 01/30/2019    7:14 AM 01/09/2019    7:14 AM 06/17/2017    8:30 AM 11/16/2016    2:00 PM  Advanced Directives  Does Patient Have a Medical Advance Directive? No Yes No Yes Yes Yes Yes  Type of Furniture conservator/restorer;Living will Healthcare Power of [employer] of [place];Living will Healthcare Power of [place];Living will Healthcare Power of [place];Living will Healthcare Power of [place];Living will  Does patient want to make changes to medical advance directive?    No - Patient declined No - Patient declined    Copy of Healthcare Power of Attorney in Chart?  No - copy requested  No - copy requested No - copy requested No - copy requested No - copy requested  Would patient like information on creating a medical advance directive? No - Patient declined          Current Medications (verified) Outpatient Encounter Medications as of 08/12/2022  Medication Sig   fluticasone (FLONASE) 50 MCG/ACT nasal spray Place 2 sprays into both nostrils daily.   furosemide (LASIX) 40 MG tablet TAKE ONE (1) TABLET BY MOUTH ONCE  DAILY   nadolol (CORGARD) 20 MG tablet TAKE (1) TABLET BY MOUTH EVERY DAY   No facility-administered encounter medications on file as of 08/12/2022.    Allergies (verified) Baclofen, Codeine, Sulfa antibiotics, and Prednisone   History: Past Medical History:  Diagnosis Date   Adjustment disorder with anxious mood 10/05/2013   Anxiety    Arthritis    back   Bronchitis    currently   Calculus of kidney 03/25/2015   CHF (congestive heart failure) (HCC)    pt says she thinks she has this   Cirrhosis of liver (HCC) 2016   Depression    Esophageal varices (HCC)    Esophageal varices (HCC)    Gastritis without bleeding 2019   GERD (gastroesophageal reflux disease)    H/O: depression    History of kidney stones    Hx of sinus tachycardia 07/06/2017   Hypertension    Nerve root pain 12/11/2011   Non-alcoholic fatty liver disease    Nonalcoholic fatty liver disease    Osteopenia    Osteopenia    Reflux    in past   Spinal stenosis    Tachycardia 03/25/2015   Tendinopathy of right gluteus medius 01/01/2017   Vertigo    3 yrs ago   Vitamin D deficiency    Vitamin D deficiency    Past Surgical History:  Procedure Laterality Date   ABDOMINAL HYSTERECTOMY     APPENDECTOMY  BREAST BIOPSY Right    neg needle bx   CATARACT EXTRACTION W/PHACO Right 01/09/2019   Procedure: CATARACT EXTRACTION PHACO AND INTRAOCULAR LENS PLACEMENT (IOC) RIGHT 8.60,        00:53.3;  Surgeon: Nevada Crane, MD;  Location: [facility];  Service: Ophthalmology;  Laterality: Right;   CATARACT EXTRACTION W/PHACO Left 01/30/2019   Procedure: CATARACT EXTRACTION PHACO AND INTRAOCULAR LENS PLACEMENT (IOC) LEFT, 2.59, 00:25.5;  Surgeon: Nevada Crane, MD;  Location: [facility];  Service: Ophthalmology;  Laterality: Left;   COMBINED HYSTERECTOMY ABDOMINAL W/ A&P REPAIR / OOPHORECTOMY     ESOPHAGOGASTRODUODENOSCOPY (EGD) WITH PROPOFOL N/A 06/17/2015   Procedure: ESOPHAGOGASTRODUODENOSCOPY  (EGD) WITH PROPOFOL;  Surgeon: [name], MD;  Location: [facility];  Service: Endoscopy;  Laterality: N/A;   ESOPHAGOGASTRODUODENOSCOPY (EGD) WITH PROPOFOL N/A 06/17/2017   Procedure: ESOPHAGOGASTRODUODENOSCOPY (EGD) WITH PROPOFOL;  Surgeon: [name], MD;  Location: [facility];  Service: Endoscopy;  Laterality: N/A;   LAPAROSCOPIC CHOLECYSTECTOMY     TONSILLECTOMY     Family History  Problem Relation Age of Onset   Heart disease Mother    Heart disease Father    Heart attack Paternal Grandfather    Asthma Son 76   Hypertension Daughter    Breast cancer Maternal Aunt    Social History   Socioeconomic History   Marital status: Widowed    Spouse name: Not on file   Number of children: Not on file   Years of education: Not on file   Highest education level: Not on file  Occupational History   Not on file  Tobacco Use   Smoking status: Never   Smokeless tobacco: Never   Tobacco comments:    smoking cessation materials not required  Vaping Use   Vaping Use: Never used  Substance and Sexual Activity   Alcohol use: No    Alcohol/week: 0.0 standard drinks of alcohol   Drug use: No   Sexual activity: Yes    Birth control/protection: Post-menopausal  Other Topics Concern   Not on file  Social History Narrative   Pt lives alone   Social Determinants of Health   Financial Resource Strain: Low Risk  (08/12/2022)   Overall Financial Resource Strain (CARDIA)    Difficulty of Paying Living Expenses: Not hard at all  Food Insecurity: No Food Insecurity (08/12/2022)   Hunger Vital Sign    Worried About Running Out of Food in the Last Year: Never true    Ran Out of Food in the Last Year: Never true  Transportation Needs: No Transportation Needs (08/12/2022)   PRAPARE - Administrator, Civil Service (Medical): No    Lack of Transportation (Non-Medical): No  Physical Activity: Inactive (08/12/2022)   Exercise Vital Sign    Days of Exercise per Week: 0  days    Minutes of Exercise per Session: 0 min  Stress: No Stress Concern Present (08/12/2022)   [employer] of Occupational Health - Occupational Stress Questionnaire    Feeling of Stress : Only a little  Social Connections: Socially Isolated (08/12/2022)   Social Connection and Isolation Panel [NHANES]    Frequency of Communication with Friends and Family: More than three times a week    Frequency of Social Gatherings with Friends and Family: More than three times a week    Attends Religious Services: Never    Database administrator or Organizations: No    Attends Banker Meetings: Never  Marital Status: Widowed    Tobacco Counseling Counseling given: Not Answered Tobacco comments: smoking cessation materials not required   Clinical Intake:  Pre-visit preparation completed: Yes  Pain : 0-10 Pain Score: 5  Pain Type: Chronic pain Pain Location: Back     Nutritional Risks: None Diabetes: No  How often do you need to have someone help you when you read instructions, pamphlets, or other written materials from your doctor or pharmacy?: 1 - Never  Interpreter Needed?: No  Information entered by :: Kennedy Bucker, LPN   Activities of Daily Living    08/12/2022   10:10 AM  In your present state of health, do you have any difficulty performing the following activities:  Hearing? 0  Vision? 0  Difficulty concentrating or making decisions? 0  Walking or climbing stairs? 1  Dressing or bathing? 1  Doing errands, shopping? 1  Preparing Food and eating ? Y  Using the Toilet? Y  In the past six months, have you accidently leaked urine? N  Do you have problems with loss of bowel control? N  Managing your Medications? Y  Managing your Finances? Y  Housekeeping or managing your Housekeeping? Y    Patient Care Team: Reubin Milan, MD as PCP - General (Internal Medicine) [name], MD as Consulting Physician (Gastroenterology) Vernie Murders, MD as  Consulting Physician (Otolaryngology) Nevada Crane, MD as Consulting Physician (Ophthalmology) Elijah Birk, MD as Referring Physician (Physical Medicine and Rehabilitation)  Indicate any recent Medical Services you may have received from other than [facility] providers in the past year (date may be approximate).     Assessment:   This is a routine wellness examination for Jenna Franklin.  Hearing/Vision screen Hearing Screening - Comments:: No aids Vision Screening - Comments:: Glasses to read, had cataract sgy - Dr.King  Dietary issues and exercise activities discussed:     Goals Addressed             This Visit's Progress    DIET - EAT MORE FRUITS AND VEGETABLES         Depression Screen    08/12/2022   10:06 AM 07/30/2021   10:36 AM 06/10/2021    2:37 PM 03/20/2020    2:20 PM 09/06/2019    7:59 AM 01/18/2019   11:03 AM 07/27/2018    1:43 PM  PHQ 2/9 Scores  PHQ - 2 Score 0 0 1 0 0 0 0  PHQ- 9 Score 2 2 2  2  3     Fall Risk    08/12/2022   10:08 AM 07/30/2021   10:39 AM 06/10/2021    2:38 PM 03/20/2020    2:26 PM 09/06/2019    7:59 AM  Fall Risk   Falls in the past year? 1 1 1 1 1   Number falls in past yr: 1 0 0 1 0  Injury with Fall? 1 1 1 1 1   Risk for fall due to : History of fall(s) History of fall(s) No Fall Risks History of fall(s);Impaired balance/gait;Impaired mobility   Follow up Falls evaluation completed;Falls prevention discussed Falls prevention discussed Falls evaluation completed Falls prevention discussed Falls evaluation completed    MEDICARE RISK AT HOME:  Medicare Risk at Home - 08/12/22 1008     Any stairs in or around the home? Yes    If so, are there any without handrails? No    Home free of loose throw rugs in walkways, pet beds, electrical cords, etc? Yes  Adequate lighting in your home to reduce risk of falls? Yes    Life alert? Yes    Use of a cane, walker or w/c? Yes   walker   Grab bars in the bathroom? Yes    Shower chair or bench  in shower? Yes    Elevated toilet seat or a handicapped toilet? Yes             TIMED UP AND GO:  Was the test performed?  No    Cognitive Function:        08/12/2022   10:13 AM 11/16/2016    2:03 PM  6CIT Screen  What Year? 4 points 0 points  What month? 0 points 0 points  What time? 0 points 0 points  Count back from 20 0 points 0 points  Months in reverse 4 points 0 points  Repeat phrase 4 points 0 points  Total Score 12 points 0 points    Immunizations Immunization History  Administered Date(s) Administered   Influenza, High Dose Seasonal PF 11/16/2016   Influenza,inj,Quad PF,6+ Mos 02/18/2016   Influenza,inj,quad, With Preservative 12/21/2017, 11/23/2018   Moderna Sars-Covid-2 Vaccination 04/22/2019, 05/20/2019   Pneumococcal Conjugate-13 11/16/2016   Pneumococcal Polysaccharide-23 03/01/2018   Tdap 01/11/2017, 08/19/2020    TDAP status: Up to date  Flu Vaccine status: Declined, Education has been provided regarding the importance of this vaccine but patient still declined. Advised may receive this vaccine at local pharmacy or Health Dept. Aware to provide a copy of the vaccination record if obtained from local pharmacy or Health Dept. Verbalized acceptance and understanding.  Pneumococcal vaccine status: Up to date  Covid-19 vaccine status: Completed vaccines  Qualifies for Shingles Vaccine? Yes   Zostavax completed No   Shingrix Completed?: No.    Education has been provided regarding the importance of this vaccine. Patient has been advised to call insurance company to determine out of pocket expense if they have not yet received this vaccine. Advised may also receive vaccine at local pharmacy or Health Dept. Verbalized acceptance and understanding.  Screening Tests Health Maintenance  Topic Date Due   Zoster Vaccines- Shingrix (1 of 2) Never done   COVID-19 Vaccine (3 - 2023-24 season) 10/17/2021   INFLUENZA VACCINE  09/17/2022   Medicare Annual  Wellness (AWV)  08/12/2023   DTaP/Tdap/Td (3 - Td or Tdap) 08/20/2030   Pneumonia Vaccine 49+ Years old  Completed   DEXA SCAN  Completed   HPV VACCINES  Aged Out   Hepatitis C Screening  Discontinued    Health Maintenance  Health Maintenance Due  Topic Date Due   Zoster Vaccines- Shingrix (1 of 2) Never done   COVID-19 Vaccine (3 - 2023-24 season) 10/17/2021    Colorectal cancer screening: No longer required.   Mammogram status: No longer required due to age.  Bone Density status: Completed 02/21/13. Results reflect: Bone density results: OSTEOPENIA. Repeat every 5 years.- declined referral  Lung Cancer Screening: (Low Dose CT Chest recommended if Age 35-80 years, 20 pack-year currently smoking OR have quit w/in 15years.) does not qualify.    Additional Screening:  Hepatitis C Screening: does not qualify; Completed 09/06/19  Vision Screening: Recommended annual ophthalmology exams for early detection of glaucoma and other disorders of the eye. Is the patient up to date with their annual eye exam?  Yes  Who is the provider or what is the name of the office in which the patient attends annual eye exams? Dr.King If pt is not established  with a provider, would they like to be referred to a provider to establish care? No .   Dental Screening: Recommended annual dental exams for proper oral hygiene    Community Resource Referral / Chronic Care Management: CRR required this visit?  No   CCM required this visit?  No     Plan:     I have personally reviewed and noted the following in the patient's chart:   Medical and social history Use of alcohol, tobacco or illicit drugs  Current medications and supplements including opioid prescriptions. Patient is not currently taking opioid prescriptions. Functional ability and status Nutritional status Physical activity Advanced directives List of other physicians Hospitalizations, surgeries, and ER visits in previous 12  months Vitals Screenings to include cognitive, depression, and falls Referrals and appointments  In addition, I have reviewed and discussed with patient certain preventive protocols, quality metrics, and best practice recommendations. A written personalized care plan for preventive services as well as general preventive health recommendations were provided to patient.     Hal Hope, LPN   [id number]   After Visit Summary: (MyChart) Due to this being a telephonic visit, the after visit summary with patients personalized plan was offered to patient via MyChart   Nurse Notes: has nurses to assist during week- walks w/ walker

## 2022-09-28 ENCOUNTER — Telehealth: Payer: Self-pay | Admitting: *Deleted

## 2022-09-28 NOTE — Telephone Encounter (Signed)
I connected with Jenna Franklin daughter on 8/12 at 1055 by telephone and verified that I am speaking with the correct person using two identifiers. According to the patient's chart they are due for follow up with  [facility]. There are no transportation issues at this time. Franklin scheduled. Nothing further was needed at the end of our conversation.

## 2022-11-10 ENCOUNTER — Ambulatory Visit: Payer: Medicare HMO | Admitting: Internal Medicine

## 2023-08-24 ENCOUNTER — Ambulatory Visit: Admitting: Internal Medicine

## 2023-08-26 ENCOUNTER — Encounter
Admission: EM | Disposition: A | Discharge status: Skilled Nursing Facility | Payer: Self-pay | Source: Home / Self Care | Attending: Internal Medicine

## 2023-08-26 ENCOUNTER — Inpatient Hospital Stay

## 2023-08-26 ENCOUNTER — Emergency Department: Admitting: Certified Registered"

## 2023-08-26 ENCOUNTER — Emergency Department

## 2023-08-26 ENCOUNTER — Other Ambulatory Visit: Payer: Self-pay

## 2023-08-26 ENCOUNTER — Inpatient Hospital Stay
Admission: EM | Admit: 2023-08-26 | Discharge: 2023-09-06 | DRG: 480 | Disposition: A | Discharge status: Skilled Nursing Facility | Attending: Internal Medicine | Admitting: Internal Medicine

## 2023-08-26 DIAGNOSIS — K7682 Hepatic encephalopathy: Secondary | ICD-10-CM | POA: Diagnosis present

## 2023-08-26 DIAGNOSIS — M8580 Other specified disorders of bone density and structure, unspecified site: Secondary | ICD-10-CM | POA: Diagnosis present

## 2023-08-26 DIAGNOSIS — Z961 Presence of intraocular lens: Secondary | ICD-10-CM | POA: Diagnosis present

## 2023-08-26 DIAGNOSIS — S2232XA Fracture of one rib, left side, initial encounter for closed fracture: Secondary | ICD-10-CM | POA: Diagnosis not present

## 2023-08-26 DIAGNOSIS — I2699 Other pulmonary embolism without acute cor pulmonale: Secondary | ICD-10-CM | POA: Diagnosis not present

## 2023-08-26 DIAGNOSIS — D500 Iron deficiency anemia secondary to blood loss (chronic): Secondary | ICD-10-CM | POA: Diagnosis not present

## 2023-08-26 DIAGNOSIS — K5909 Other constipation: Secondary | ICD-10-CM | POA: Diagnosis not present

## 2023-08-26 DIAGNOSIS — R634 Abnormal weight loss: Secondary | ICD-10-CM | POA: Insufficient documentation

## 2023-08-26 DIAGNOSIS — I2609 Other pulmonary embolism with acute cor pulmonale: Secondary | ICD-10-CM | POA: Diagnosis not present

## 2023-08-26 DIAGNOSIS — J90 Pleural effusion, not elsewhere classified: Secondary | ICD-10-CM | POA: Diagnosis not present

## 2023-08-26 DIAGNOSIS — K922 Gastrointestinal hemorrhage, unspecified: Secondary | ICD-10-CM | POA: Diagnosis not present

## 2023-08-26 DIAGNOSIS — Z789 Other specified health status: Secondary | ICD-10-CM | POA: Diagnosis not present

## 2023-08-26 DIAGNOSIS — C184 Malignant neoplasm of transverse colon: Secondary | ICD-10-CM | POA: Diagnosis not present

## 2023-08-26 DIAGNOSIS — K7460 Unspecified cirrhosis of liver: Secondary | ICD-10-CM | POA: Diagnosis present

## 2023-08-26 DIAGNOSIS — C7802 Secondary malignant neoplasm of left lung: Secondary | ICD-10-CM | POA: Diagnosis present

## 2023-08-26 DIAGNOSIS — D696 Thrombocytopenia, unspecified: Secondary | ICD-10-CM | POA: Diagnosis present

## 2023-08-26 DIAGNOSIS — R000 Tachycardia, unspecified: Secondary | ICD-10-CM | POA: Diagnosis not present

## 2023-08-26 DIAGNOSIS — M79604 Pain in right leg: Secondary | ICD-10-CM | POA: Diagnosis not present

## 2023-08-26 DIAGNOSIS — K219 Gastro-esophageal reflux disease without esophagitis: Secondary | ICD-10-CM | POA: Diagnosis not present

## 2023-08-26 DIAGNOSIS — Z87442 Personal history of urinary calculi: Secondary | ICD-10-CM

## 2023-08-26 DIAGNOSIS — R6510 Systemic inflammatory response syndrome (SIRS) of non-infectious origin without acute organ dysfunction: Secondary | ICD-10-CM | POA: Diagnosis not present

## 2023-08-26 DIAGNOSIS — Z6822 Body mass index (BMI) 22.0-22.9, adult: Secondary | ICD-10-CM

## 2023-08-26 DIAGNOSIS — Y92009 Unspecified place in unspecified non-institutional (private) residence as the place of occurrence of the external cause: Secondary | ICD-10-CM

## 2023-08-26 DIAGNOSIS — C7801 Secondary malignant neoplasm of right lung: Secondary | ICD-10-CM | POA: Diagnosis present

## 2023-08-26 DIAGNOSIS — I8500 Esophageal varices without bleeding: Secondary | ICD-10-CM | POA: Diagnosis not present

## 2023-08-26 DIAGNOSIS — Z8719 Personal history of other diseases of the digestive system: Secondary | ICD-10-CM | POA: Diagnosis not present

## 2023-08-26 DIAGNOSIS — I8510 Secondary esophageal varices without bleeding: Secondary | ICD-10-CM | POA: Diagnosis not present

## 2023-08-26 DIAGNOSIS — G319 Degenerative disease of nervous system, unspecified: Secondary | ICD-10-CM | POA: Diagnosis not present

## 2023-08-26 DIAGNOSIS — Z043 Encounter for examination and observation following other accident: Secondary | ICD-10-CM | POA: Diagnosis not present

## 2023-08-26 DIAGNOSIS — R609 Edema, unspecified: Secondary | ICD-10-CM | POA: Diagnosis not present

## 2023-08-26 DIAGNOSIS — E43 Unspecified severe protein-calorie malnutrition: Secondary | ICD-10-CM | POA: Diagnosis not present

## 2023-08-26 DIAGNOSIS — I2693 Single subsegmental pulmonary embolism without acute cor pulmonale: Secondary | ICD-10-CM | POA: Diagnosis not present

## 2023-08-26 DIAGNOSIS — R410 Disorientation, unspecified: Secondary | ICD-10-CM | POA: Diagnosis not present

## 2023-08-26 DIAGNOSIS — K760 Fatty (change of) liver, not elsewhere classified: Secondary | ICD-10-CM | POA: Diagnosis present

## 2023-08-26 DIAGNOSIS — Z743 Need for continuous supervision: Secondary | ICD-10-CM | POA: Diagnosis not present

## 2023-08-26 DIAGNOSIS — Z604 Social exclusion and rejection: Secondary | ICD-10-CM | POA: Diagnosis present

## 2023-08-26 DIAGNOSIS — Z515 Encounter for palliative care: Secondary | ICD-10-CM

## 2023-08-26 DIAGNOSIS — R9082 White matter disease, unspecified: Secondary | ICD-10-CM | POA: Diagnosis not present

## 2023-08-26 DIAGNOSIS — Z9842 Cataract extraction status, left eye: Secondary | ICD-10-CM

## 2023-08-26 DIAGNOSIS — M79605 Pain in left leg: Secondary | ICD-10-CM | POA: Diagnosis not present

## 2023-08-26 DIAGNOSIS — S72141A Displaced intertrochanteric fracture of right femur, initial encounter for closed fracture: Secondary | ICD-10-CM | POA: Diagnosis not present

## 2023-08-26 DIAGNOSIS — Z8249 Family history of ischemic heart disease and other diseases of the circulatory system: Secondary | ICD-10-CM

## 2023-08-26 DIAGNOSIS — S7291XA Unspecified fracture of right femur, initial encounter for closed fracture: Secondary | ICD-10-CM | POA: Diagnosis not present

## 2023-08-26 DIAGNOSIS — F419 Anxiety disorder, unspecified: Secondary | ICD-10-CM | POA: Diagnosis present

## 2023-08-26 DIAGNOSIS — R0989 Other specified symptoms and signs involving the circulatory and respiratory systems: Secondary | ICD-10-CM | POA: Diagnosis not present

## 2023-08-26 DIAGNOSIS — R188 Other ascites: Secondary | ICD-10-CM | POA: Diagnosis not present

## 2023-08-26 DIAGNOSIS — S7291XD Unspecified fracture of right femur, subsequent encounter for closed fracture with routine healing: Secondary | ICD-10-CM | POA: Diagnosis not present

## 2023-08-26 DIAGNOSIS — Z90710 Acquired absence of both cervix and uterus: Secondary | ICD-10-CM

## 2023-08-26 DIAGNOSIS — R4182 Altered mental status, unspecified: Secondary | ICD-10-CM | POA: Diagnosis not present

## 2023-08-26 DIAGNOSIS — I10 Essential (primary) hypertension: Secondary | ICD-10-CM | POA: Diagnosis not present

## 2023-08-26 DIAGNOSIS — Z9049 Acquired absence of other specified parts of digestive tract: Secondary | ICD-10-CM

## 2023-08-26 DIAGNOSIS — I110 Hypertensive heart disease with heart failure: Secondary | ICD-10-CM | POA: Diagnosis present

## 2023-08-26 DIAGNOSIS — G9341 Metabolic encephalopathy: Secondary | ICD-10-CM | POA: Diagnosis not present

## 2023-08-26 DIAGNOSIS — S72041A Displaced fracture of base of neck of right femur, initial encounter for closed fracture: Secondary | ICD-10-CM | POA: Diagnosis not present

## 2023-08-26 DIAGNOSIS — Z9841 Cataract extraction status, right eye: Secondary | ICD-10-CM

## 2023-08-26 DIAGNOSIS — S299XXA Unspecified injury of thorax, initial encounter: Secondary | ICD-10-CM | POA: Diagnosis not present

## 2023-08-26 DIAGNOSIS — R58 Hemorrhage, not elsewhere classified: Secondary | ICD-10-CM | POA: Diagnosis not present

## 2023-08-26 DIAGNOSIS — W19XXXA Unspecified fall, initial encounter: Secondary | ICD-10-CM | POA: Diagnosis not present

## 2023-08-26 DIAGNOSIS — I959 Hypotension, unspecified: Secondary | ICD-10-CM | POA: Diagnosis not present

## 2023-08-26 DIAGNOSIS — S72001A Fracture of unspecified part of neck of right femur, initial encounter for closed fracture: Secondary | ICD-10-CM | POA: Diagnosis not present

## 2023-08-26 DIAGNOSIS — Z7189 Other specified counseling: Secondary | ICD-10-CM | POA: Diagnosis not present

## 2023-08-26 DIAGNOSIS — R161 Splenomegaly, not elsewhere classified: Secondary | ICD-10-CM | POA: Diagnosis not present

## 2023-08-26 DIAGNOSIS — F0394 Unspecified dementia, unspecified severity, with anxiety: Secondary | ICD-10-CM | POA: Diagnosis not present

## 2023-08-26 DIAGNOSIS — J9811 Atelectasis: Secondary | ICD-10-CM | POA: Diagnosis not present

## 2023-08-26 DIAGNOSIS — K6389 Other specified diseases of intestine: Secondary | ICD-10-CM | POA: Insufficient documentation

## 2023-08-26 DIAGNOSIS — M25551 Pain in right hip: Secondary | ICD-10-CM | POA: Diagnosis not present

## 2023-08-26 DIAGNOSIS — Z7901 Long term (current) use of anticoagulants: Secondary | ICD-10-CM

## 2023-08-26 DIAGNOSIS — S3992XA Unspecified injury of lower back, initial encounter: Secondary | ICD-10-CM | POA: Diagnosis not present

## 2023-08-26 DIAGNOSIS — R339 Retention of urine, unspecified: Secondary | ICD-10-CM | POA: Diagnosis not present

## 2023-08-26 DIAGNOSIS — Z79899 Other long term (current) drug therapy: Secondary | ICD-10-CM

## 2023-08-26 DIAGNOSIS — W19XXXD Unspecified fall, subsequent encounter: Secondary | ICD-10-CM | POA: Diagnosis not present

## 2023-08-26 DIAGNOSIS — K7031 Alcoholic cirrhosis of liver with ascites: Secondary | ICD-10-CM | POA: Diagnosis not present

## 2023-08-26 HISTORY — PX: INTRAMEDULLARY (IM) NAIL INTERTROCHANTERIC: SHX5875

## 2023-08-26 LAB — CBC
HCT: 32.7 % — ABNORMAL LOW (ref 36.0–46.0)
Hemoglobin: 10.6 g/dL — ABNORMAL LOW (ref 12.0–15.0)
MCH: 29.4 pg (ref 26.0–34.0)
MCHC: 32.4 g/dL (ref 30.0–36.0)
MCV: 90.6 fL (ref 80.0–100.0)
Platelets: 142 K/uL — ABNORMAL LOW (ref 150–400)
RBC: 3.61 MIL/uL — ABNORMAL LOW (ref 3.87–5.11)
RDW: 16 % — ABNORMAL HIGH (ref 11.5–15.5)
WBC: 6.5 K/uL (ref 4.0–10.5)
nRBC: 0 % (ref 0.0–0.2)

## 2023-08-26 LAB — COMPREHENSIVE METABOLIC PANEL WITH GFR
ALT: 17 U/L (ref 0–44)
AST: 40 U/L (ref 15–41)
Albumin: 2.9 g/dL — ABNORMAL LOW (ref 3.5–5.0)
Alkaline Phosphatase: 59 U/L (ref 38–126)
Anion gap: 11 (ref 5–15)
BUN: 8 mg/dL (ref 8–23)
CO2: 20 mmol/L — ABNORMAL LOW (ref 22–32)
Calcium: 8.2 mg/dL — ABNORMAL LOW (ref 8.9–10.3)
Chloride: 105 mmol/L (ref 98–111)
Creatinine, Ser: 0.64 mg/dL (ref 0.44–1.00)
GFR, Estimated: >60 mL/min (ref >60)
Glucose, Bld: 219 mg/dL — ABNORMAL HIGH (ref 70–99)
Potassium: 3.7 mmol/L (ref 3.5–5.1)
Sodium: 136 mmol/L (ref 135–145)
Total Bilirubin: 1.6 mg/dL — ABNORMAL HIGH (ref 0.0–1.2)
Total Protein: 6.5 g/dL (ref 6.5–8.1)

## 2023-08-26 LAB — CK: Total CK: 45 U/L (ref 38–234)

## 2023-08-26 LAB — AMMONIA: Ammonia: 23 umol/L (ref 9–35)

## 2023-08-26 LAB — TROPONIN I (HIGH SENSITIVITY): Troponin I (High Sensitivity): 5 ng/L (ref <18)

## 2023-08-26 SURGERY — FIXATION, FRACTURE, INTERTROCHANTERIC, WITH INTRAMEDULLARY ROD
Anesthesia: General | Laterality: Right

## 2023-08-26 MED ORDER — TRANEXAMIC ACID-NACL 1000-0.7 MG/100ML-% IV SOLN
INTRAVENOUS | Status: AC
  Filled 2023-08-26: qty 100

## 2023-08-26 MED ORDER — ROCURONIUM BROMIDE 100 MG/10ML IV SOLN
INTRAVENOUS | Status: DC | PRN
  Administered 2023-08-26: 50 mg via INTRAVENOUS

## 2023-08-26 MED ORDER — METOCLOPRAMIDE HCL 5 MG/ML IJ SOLN: 5.0000 mg | Freq: Three times a day (TID) | INTRAMUSCULAR | Status: DC | PRN

## 2023-08-26 MED ORDER — SUGAMMADEX SODIUM 500 MG/5ML IV SOLN
INTRAVENOUS | Status: DC | PRN
  Administered 2023-08-26: 50 mg via INTRAVENOUS
  Administered 2023-08-26: 100 mg via INTRAVENOUS

## 2023-08-26 MED ORDER — KETAMINE HCL 50 MG/5ML IJ SOSY
PREFILLED_SYRINGE | INTRAMUSCULAR | Status: DC | PRN
  Administered 2023-08-26: 20 mg via INTRAVENOUS

## 2023-08-26 MED ORDER — FENTANYL CITRATE PF 50 MCG/ML IJ SOSY
50.0000 ug | PREFILLED_SYRINGE | INTRAMUSCULAR | Status: DC | PRN
  Administered 2023-08-26 (×2): 50 ug via INTRAVENOUS
  Filled 2023-08-26 (×2): qty 1

## 2023-08-26 MED ORDER — CEFAZOLIN SODIUM-DEXTROSE 2-4 GM/100ML-% IV SOLN
2.0000 g | Freq: Four times a day (QID) | INTRAVENOUS | Status: AC
  Administered 2023-08-26: 2 g via INTRAVENOUS
  Filled 2023-08-26 (×2): qty 100

## 2023-08-26 MED ORDER — LORAZEPAM 2 MG/ML IJ SOLN: 0.5000 mg | Freq: Four times a day (QID) | INTRAMUSCULAR | Status: DC | PRN

## 2023-08-26 MED ORDER — HYDROCODONE-ACETAMINOPHEN 7.5-325 MG PO TABS
1.0000 | ORAL_TABLET | ORAL | Status: DC | PRN
  Administered 2023-08-27 – 2023-08-28 (×6): 2 via ORAL
  Filled 2023-08-26 (×6): qty 2

## 2023-08-26 MED ORDER — FENTANYL CITRATE (PF) 100 MCG/2ML IJ SOLN
INTRAMUSCULAR | Status: AC
  Filled 2023-08-26: qty 2

## 2023-08-26 MED ORDER — MENTHOL 3 MG MT LOZG: 1.0000 | LOZENGE | OROMUCOSAL | Status: DC | PRN

## 2023-08-26 MED ORDER — HEPARIN SODIUM (PORCINE) 5000 UNIT/ML IJ SOLN: 5000.0000 [IU] | Freq: Three times a day (TID) | INTRAMUSCULAR | Status: DC

## 2023-08-26 MED ORDER — TRANEXAMIC ACID-NACL 1000-0.7 MG/100ML-% IV SOLN
1000.0000 mg | INTRAVENOUS | Status: AC
  Administered 2023-08-26: 1000 mg via INTRAVENOUS

## 2023-08-26 MED ORDER — LACTATED RINGERS IV SOLN: INTRAVENOUS | Status: DC | PRN

## 2023-08-26 MED ORDER — KETAMINE HCL 50 MG/5ML IJ SOSY
PREFILLED_SYRINGE | INTRAMUSCULAR | Status: AC
  Filled 2023-08-26: qty 5

## 2023-08-26 MED ORDER — HYDROCODONE-ACETAMINOPHEN 5-325 MG PO TABS: 1.0000 | ORAL_TABLET | ORAL | Status: DC | PRN

## 2023-08-26 MED ORDER — ENOXAPARIN SODIUM 40 MG/0.4ML IJ SOSY
40.0000 mg | PREFILLED_SYRINGE | INTRAMUSCULAR | Status: DC
  Administered 2023-08-27 – 2023-08-29 (×3): 40 mg via SUBCUTANEOUS
  Filled 2023-08-26 (×3): qty 0.4

## 2023-08-26 MED ORDER — PHENYLEPHRINE 80 MCG/ML (10ML) SYRINGE FOR IV PUSH (FOR BLOOD PRESSURE SUPPORT)
PREFILLED_SYRINGE | INTRAVENOUS | Status: DC | PRN
  Administered 2023-08-26: 160 ug via INTRAVENOUS

## 2023-08-26 MED ORDER — LIDOCAINE HCL (CARDIAC) PF 100 MG/5ML IV SOSY
PREFILLED_SYRINGE | INTRAVENOUS | Status: DC | PRN
  Administered 2023-08-26: 40 mg via INTRAVENOUS

## 2023-08-26 MED ORDER — PHENYLEPHRINE HCL-NACL 20-0.9 MG/250ML-% IV SOLN
INTRAVENOUS | Status: DC | PRN
  Administered 2023-08-26: 30 ug/min via INTRAVENOUS

## 2023-08-26 MED ORDER — OXYCODONE HCL 5 MG PO TABS: 5.0000 mg | ORAL_TABLET | Freq: Once | ORAL | Status: DC | PRN

## 2023-08-26 MED ORDER — FENTANYL CITRATE (PF) 100 MCG/2ML IJ SOLN
INTRAMUSCULAR | Status: DC | PRN
  Administered 2023-08-26: 50 ug via INTRAVENOUS
  Administered 2023-08-26: 25 ug via INTRAVENOUS

## 2023-08-26 MED ORDER — LACTATED RINGERS IV SOLN: INTRAVENOUS | Status: DC

## 2023-08-26 MED ORDER — KETOROLAC TROMETHAMINE 15 MG/ML IJ SOLN
INTRAMUSCULAR | Status: AC
  Filled 2023-08-26: qty 1

## 2023-08-26 MED ORDER — PROPOFOL 10 MG/ML IV BOLUS
INTRAVENOUS | Status: DC | PRN
  Administered 2023-08-26: 100 mg via INTRAVENOUS

## 2023-08-26 MED ORDER — MORPHINE SULFATE (PF) 2 MG/ML IV SOLN
0.5000 mg | INTRAVENOUS | Status: DC | PRN
  Administered 2023-08-26 – 2023-08-29 (×4): 1 mg via INTRAVENOUS
  Filled 2023-08-26 (×4): qty 1

## 2023-08-26 MED ORDER — CEFAZOLIN SODIUM-DEXTROSE 2-4 GM/100ML-% IV SOLN
2.0000 g | Freq: Once | INTRAVENOUS | Status: AC
  Administered 2023-08-26: 2 g via INTRAVENOUS

## 2023-08-26 MED ORDER — HYDROCODONE-ACETAMINOPHEN 5-325 MG PO TABS: 1.0000 | ORAL_TABLET | Freq: Four times a day (QID) | ORAL | Status: DC | PRN

## 2023-08-26 MED ORDER — ONDANSETRON HCL 4 MG PO TABS: 4.0000 mg | ORAL_TABLET | Freq: Four times a day (QID) | ORAL | Status: DC | PRN

## 2023-08-26 MED ORDER — KETOROLAC TROMETHAMINE 15 MG/ML IJ SOLN
7.5000 mg | Freq: Four times a day (QID) | INTRAMUSCULAR | Status: AC
Start: 2023-08-26 — End: 2023-08-27
  Administered 2023-08-26 – 2023-08-27 (×2): 7.5 mg via INTRAVENOUS
  Filled 2023-08-26 (×2): qty 1

## 2023-08-26 MED ORDER — DEXAMETHASONE SODIUM PHOSPHATE 10 MG/ML IJ SOLN
INTRAMUSCULAR | Status: DC | PRN
  Administered 2023-08-26: 4 mg via INTRAVENOUS

## 2023-08-26 MED ORDER — OXYCODONE HCL 5 MG/5ML PO SOLN: 5.0000 mg | Freq: Once | ORAL | Status: DC | PRN

## 2023-08-26 MED ORDER — ONDANSETRON HCL 4 MG/2ML IJ SOLN: 4.0000 mg | Freq: Four times a day (QID) | INTRAMUSCULAR | Status: DC | PRN

## 2023-08-26 MED ORDER — PHENOL 1.4 % MT LIQD: 1.0000 | OROMUCOSAL | Status: DC | PRN

## 2023-08-26 MED ORDER — 0.9 % SODIUM CHLORIDE (POUR BTL) OPTIME
TOPICAL | Status: DC | PRN
  Administered 2023-08-26: 500 mL

## 2023-08-26 MED ORDER — CEFAZOLIN SODIUM-DEXTROSE 2-4 GM/100ML-% IV SOLN
INTRAVENOUS | Status: AC
  Filled 2023-08-26: qty 100

## 2023-08-26 MED ORDER — BUPIVACAINE-EPINEPHRINE (PF) 0.25% -1:200000 IJ SOLN
INTRAMUSCULAR | Status: AC
  Filled 2023-08-26: qty 30

## 2023-08-26 MED ORDER — DOCUSATE SODIUM 100 MG PO CAPS
100.0000 mg | ORAL_CAPSULE | Freq: Two times a day (BID) | ORAL | Status: DC
  Administered 2023-08-26 – 2023-09-05 (×17): 100 mg via ORAL
  Filled 2023-08-26 (×17): qty 1

## 2023-08-26 MED ORDER — ONDANSETRON HCL 4 MG/2ML IJ SOLN
INTRAMUSCULAR | Status: DC | PRN
  Administered 2023-08-26: 4 mg via INTRAVENOUS

## 2023-08-26 MED ORDER — PROPOFOL 10 MG/ML IV BOLUS
INTRAVENOUS | Status: AC
  Filled 2023-08-26: qty 20

## 2023-08-26 MED ORDER — ONDANSETRON HCL 4 MG/2ML IJ SOLN: 4.0000 mg | Freq: Once | INTRAMUSCULAR | Status: DC | PRN

## 2023-08-26 MED ORDER — MORPHINE SULFATE (PF) 2 MG/ML IV SOLN: 1.0000 mg | INTRAVENOUS | Status: DC | PRN

## 2023-08-26 MED ORDER — ACETAMINOPHEN 325 MG PO TABS
325.0000 mg | ORAL_TABLET | Freq: Four times a day (QID) | ORAL | Status: DC | PRN
  Administered 2023-08-28: 650 mg via ORAL
  Filled 2023-08-26: qty 2

## 2023-08-26 MED ORDER — SODIUM CHLORIDE 0.9 % IV BOLUS
1000.0000 mL | Freq: Once | INTRAVENOUS | Status: AC
  Administered 2023-08-26: 1000 mL via INTRAVENOUS

## 2023-08-26 MED ORDER — DOCUSATE SODIUM 100 MG PO CAPS: 100.0000 mg | ORAL_CAPSULE | Freq: Two times a day (BID) | ORAL | Status: DC

## 2023-08-26 MED ORDER — BUPIVACAINE-EPINEPHRINE (PF) 0.25% -1:200000 IJ SOLN
INTRAMUSCULAR | Status: DC | PRN
Start: 2023-08-26 — End: 2023-08-26
  Administered 2023-08-26: 20 mL

## 2023-08-26 MED ORDER — METOCLOPRAMIDE HCL 5 MG PO TABS: 5.0000 mg | ORAL_TABLET | Freq: Three times a day (TID) | ORAL | Status: DC | PRN

## 2023-08-26 SURGICAL SUPPLY — 33 items
BIT DRILL CANN 16 HIP (BIT) IMPLANT
BIT DRILL CANN STP 6/9 HIP (BIT) IMPLANT
BIT DRILL LONG 4.2 (BIT) IMPLANT
BIT DRILL TAPERED 10 (BIT) IMPLANT
BLADE HELICAL TFNA 90 (Anchor) IMPLANT
BNDG COHESIVE 6X5 TAN ST LF (GAUZE/BANDAGES/DRESSINGS) ×2 IMPLANT
CHLORAPREP W/TINT 26 (MISCELLANEOUS) ×1 IMPLANT
DERMABOND ADVANCED .7 DNX12 (GAUZE/BANDAGES/DRESSINGS) IMPLANT
DRAPE C-ARM XRAY 36X54 (DRAPES) ×1 IMPLANT
DRAPE SHEET LG 3/4 BI-LAMINATE (DRAPES) ×1 IMPLANT
DRSG OPSITE POSTOP 3X4 (GAUZE/BANDAGES/DRESSINGS) IMPLANT
DRSG OPSITE POSTOP 4X6 (GAUZE/BANDAGES/DRESSINGS) IMPLANT
GLOVE PI ORTHO PRO STRL 7.5 (GLOVE) ×2 IMPLANT
GLOVE SURG SYN 7.5 PF PI (GLOVE) ×1 IMPLANT
GOWN SRG XL LVL 3 NONREINFORCE (GOWNS) ×1 IMPLANT
GOWN STRL REUS W/ TWL LRG LVL3 (GOWN DISPOSABLE) ×1 IMPLANT
GUIDEWIRE 3.2X400 (WIRE) IMPLANT
HANDLE YANKAUER SUCT OPEN TIP (MISCELLANEOUS) ×1 IMPLANT
KIT PATIENT CARE HANA TABLE (KITS) ×1 IMPLANT
KIT TURNOVER CYSTO (KITS) ×1 IMPLANT
MAT ABSORB FLUID 56X50 GRAY (MISCELLANEOUS) ×1 IMPLANT
NAIL CANN TFNA 9MM/130 DEG TI (Nail) IMPLANT
NDL HYPO 21X1.5 SAFETY (NEEDLE) ×1 IMPLANT
NEEDLE HYPO 21X1.5 SAFETY (NEEDLE) ×1 IMPLANT
NS IRRIG 500ML POUR BTL (IV SOLUTION) ×1 IMPLANT
PACK HIP COMPR (MISCELLANEOUS) ×1 IMPLANT
PAD ARMBOARD POSITIONER FOAM (MISCELLANEOUS) ×1 IMPLANT
PENCIL SMOKE EVACUATOR (MISCELLANEOUS) ×1 IMPLANT
SCREW LOCK STAR 5X34 (Screw) IMPLANT
SUT VIC AB 1 CT1 36 (SUTURE) ×1 IMPLANT
SUT VIC AB 2-0 CT2 27 (SUTURE) ×1 IMPLANT
SUTURE STRATA SPIR 4-0 18 (SUTURE) ×1 IMPLANT
SYR 20ML LL LF (SYRINGE) ×1 IMPLANT

## 2023-08-26 NOTE — Assessment & Plan Note (Addendum)
 Patient was not able to quantify how much weight she has lost, she only states that it is a lot Given CT read of colonic mass with omental implants and small pulmonary nodules, high suspicion for malignancy and metastatic disease cannot be excluded at this time Discussed CT finding with son at bedside Chyrl, with patient's permission

## 2023-08-26 NOTE — Anesthesia Procedure Notes (Signed)
 Procedure Name: Intubation Date/Time: 08/26/2023 4:27 PM  Performed by: Rosine Shona Jansky, CRNAPre-anesthesia Checklist: Patient identified, Emergency Drugs available, Suction available and Patient being monitored Patient Re-evaluated:Patient Re-evaluated prior to induction Oxygen Delivery Method: Circle system utilized Preoxygenation: Pre-oxygenation with 100% oxygen Induction Type: IV induction Ventilation: Mask ventilation without difficulty Laryngoscope Size: McGrath and 3 Grade View: Grade I Tube type: Oral Tube size: 7.0 mm Number of attempts: 1 Airway Equipment and Method: Stylet and Oral airway Placement Confirmation: ETT inserted through vocal cords under direct vision, positive ETCO2 and breath sounds checked- equal and bilateral Secured at: 21 cm Tube secured with: Tape Dental Injury: Teeth and Oropharynx as per pre-operative assessment

## 2023-08-26 NOTE — Assessment & Plan Note (Signed)
 Continue outpatient follow-up with GI specialist as appropriate Appears compensated at this time and not in acute exacerbation

## 2023-08-26 NOTE — Transfer of Care (Signed)
 Immediate Anesthesia Transfer of Care Note  Patient: Jenna Franklin  Procedure(s) Performed: FIXATION, FRACTURE, INTERTROCHANTERIC, WITH INTRAMEDULLARY ROD (Right)  Patient Location: PACU  Anesthesia Type:General  Level of Consciousness: drowsy  Airway & Oxygen Therapy: Patient Spontanous Breathing and Patient connected to face mask oxygen  Post-op Assessment: Report given to RN and Post -op Vital signs reviewed and stable  Post vital signs: Reviewed  Last Vitals:  Vitals Value Taken Time  BP 116/55 08/26/23 17:34  Temp    Pulse 108 08/26/23 17:37  Resp 14 08/26/23 17:37  SpO2 100 % 08/26/23 17:37  Vitals shown include unfiled device data.  Last Pain:  Vitals:   08/26/23 1542  TempSrc: Temporal  PainSc:          Complications: No notable events documented.

## 2023-08-26 NOTE — ED Notes (Signed)
 Pt to Xray at this time

## 2023-08-26 NOTE — Anesthesia Preprocedure Evaluation (Addendum)
 Anesthesia Evaluation  Patient identified by MRN, date of birth, ID band Patient awake    Reviewed: Allergy & Precautions, NPO status , Patient's Chart, lab work & pertinent test results  History of Anesthesia Complications Negative for: history of anesthetic complications  Airway Mallampati: IV   Neck ROM: Full    Dental no notable dental hx.    Pulmonary neg pulmonary ROS   Pulmonary exam normal breath sounds clear to auscultation       Cardiovascular hypertension (pt denies), Normal cardiovascular exam Rhythm:Regular Rate:Normal  ECG 08/26/23:  Sinus rhythm Left axis deviation Low voltage, extremity and precordial leads Abnormal R-wave progression, early transition Nonspecific T abnormalities, anterior leads   Neuro/Psych  PSYCHIATRIC DISORDERS Anxiety Depression    Spinal stenosis; chronic lower back pain    GI/Hepatic ,GERD  ,,(+) Cirrhosis   Esophageal Varices    NAFLD   Endo/Other  negative endocrine ROS    Renal/GU Renal disease (nephrolithiasis)     Musculoskeletal  (+) Arthritis ,    Abdominal   Peds  Hematology  (+) Blood dyscrasia (hx thrombocytopenia), anemia   Anesthesia Other Findings   Reproductive/Obstetrics                              Anesthesia Physical Anesthesia Plan  ASA: 3  Anesthesia Plan: General   Post-op Pain Management:    Induction: Intravenous  PONV Risk Score and Plan: 3 and Ondansetron , Dexamethasone  and Treatment may vary due to age or medical condition  Airway Management Planned: Oral ETT  Additional Equipment:   Intra-op Plan:   Post-operative Plan: Extubation in OR  Informed Consent: I have reviewed the patients History and Physical, chart, labs and discussed the procedure including the risks, benefits and alternatives for the proposed anesthesia with the patient or authorized representative who has indicated his/her understanding  and acceptance.     Dental advisory given  Plan Discussed with: CRNA  Anesthesia Plan Comments: (Elect GETA rather than spinal due to cirrhosis, varices, apparent ascites on physical exam today, spinal stenosis, and chronic back pain.   Son at bedside for history and consent. Patient consented for risks of anesthesia including but not limited to:  - adverse reactions to medications - damage to eyes, teeth, lips or other oral mucosa - nerve damage due to positioning  - sore throat or hoarseness - damage to heart, brain, nerves, lungs, other parts of body or loss of life  Informed patient about role of CRNA in peri- and intra-operative care.  Patient voiced understanding.)         Anesthesia Quick Evaluation

## 2023-08-26 NOTE — Hospital Course (Addendum)
 Hospital course / significant events:   HPI: [name] is an 83 year old female with history of liver cirrhosis with splenomegaly and esophageal varices, anxiety, GERD, who presents emergency department for chief concerns of fall with resulting right hip pain.  07/10: admitted to hospitalist service. Underwent R hip fracture repair 07/11: PT/OT recs for SNF.  Discussion today with family regarding colon mass and options re further w/u  07/12: stable, await SNF thru the weekend 07/13: pt remains confused. Tachycardic into this afternoon worsening, dx new PE started on heparin   07/14: remains on heparin . Echo pending and may need to consult vascular. Ascites appears a bit worse today also may need to consult IR. Updated daughter this morning, plan family meeting later this afternoon. Daughter would like oncology to give opinion on treatment options / next steps and from there we can decide how aggressive      Consultants:  Orthopedics  HemOnc  Procedures/Surgeries: Right Hip Intramedullary nail repair 08/26/2023 w/ Dr Lorelle      ASSESSMENT & PLAN:   Right femoral fracture Secondary to fall S/p Right Hip Intramedullary nail repair 08/26/2023 w/ Dr Lorelle Orthopedic surgery following Pain control WBAT  Fall precaution PT, OT Follow up with [facility] in 2 weeks  Pulmonary Embolus RV strain on CT uncertain significance Echo pending Heparin  gtt  Bed rest  Vascular consult if echo showing severe RV strain  Colonic mass with infiltration into omentum, likely malignancy Pulmonary nodule, malignancy cannot be excluded Weight loss Given CT read of colonic mass with omental implants and small pulmonary nodules, high suspicion for malignancy and metastatic disease cannot be excluded at this time Pt's son and daughter ask to defer conversation w/ patient for now until more lucid, hx memory problems / dementia and family thinks she will not remember or process the  conversation right now  Dr Jinny GI spoke 07/11 with patient's daughter, holding off on colonoscopy for now and considering overall frailty and other comorbidities may not be a good candidate for aggressive workup / treatment but would recommend further discussion with oncology team Oncology consult Consider for CT abd/pelv w/ contrast - possible liver mets w/ worsening ascites?  Monitor CMP   Cirrhosis complicated by Esophageal varices Ascites progressing on exam Appears compensated at this time and not in acute exacerbation Restart beta blocker  Caution on heparin  gtt for PE  Consider for CT abd/pelv w/ contrast - possible liver mets w/ worsening ascites?  Monitor CMP and CBC closely  Ascites appears a bit worse today also may need to consult IR pending family discussion this afternoon and if they want aggressive measures   Acute metabolic encephalopathy d/t acute illness / SIRS Underlying cognitive impairment suspect possibly dementia Pt does not have capacity to understand her medical situation / make complex decisions  Ammonia levels pending Consider brain imaging per oncology Daughter and son are involved in her decisions, daughter has been the point of contact which son is in agreement with   For now we are not informing patient of mass/cancer, per family request. Plan for daughter and myself to discuss with patient this afternoon to assess her understanding/reaction and get an idea on goals for treatment   Anxiety PDMP reviewed. currently no active prescription outpatient Prn ativan  as needed   Constipation  Bowel regimen per orders  Urinary retention  Foley   No concerns based on BMI: Body mass index is 22.07 kg/m.SABRA Significantly low or high BMI is associated with higher medical risk.  Underweight - under 18  overweight - 25 to 29 obese - 30 or more Class 1 obesity: BMI of 30.0 to 34 Class 2 obesity: BMI of 35.0 to 39 Class 3 obesity: BMI of 40.0 to 49 Super Morbid  Obesity: BMI 50-59 Super-super Morbid Obesity: BMI 60+ Healthy nutrition and physical activity advised as adjunct to other disease management and risk reduction treatments    DVT prophylaxis: Lovenox  IV fluids: no continuous IV fluids  Nutrition: Regular diet Central lines / other devices: None  Code Status: Full code ACP documentation reviewed:  none on file in [facility]  [facility] Main needs: SNF placement for rehab Medical barriers to dispo: New PE, onc consult, see above

## 2023-08-26 NOTE — ED Triage Notes (Signed)
 Patient from home. Called by daughter.  A friend heard her yelling that called daughter. Patietn fell inside the house on carpet floor and patient is currently in extreme pain to the right hip. Patietn currently laying on her leftside. Patient daughter states that patient is noncompliant with using a cane or walker.   100mcg fent. 20G RFA. Patient does not take anticoagulants according to daughter.

## 2023-08-26 NOTE — Assessment & Plan Note (Signed)
 Discussed extensively with son, Chyrl at bedside with patient's permission.   He states he will talk to his mother further after hip fracture to determine her goals of care

## 2023-08-26 NOTE — Assessment & Plan Note (Signed)
 PDMP reviewed Currently no active prescription outpatient Lorazepam  0.5 mg IV every 6 hours as needed for anxiety, seizure, 2 doses ordered

## 2023-08-26 NOTE — Anesthesia Postprocedure Evaluation (Signed)
 Anesthesia Post Note  Patient: Jenna Franklin  Procedure(s) Performed: FIXATION, FRACTURE, INTERTROCHANTERIC, WITH INTRAMEDULLARY ROD (Right)  Patient location during evaluation: PACU Anesthesia Type: General Level of consciousness: awake and alert, oriented and patient cooperative Pain management: pain level controlled Vital Signs Assessment: post-procedure vital signs reviewed and stable Respiratory status: spontaneous breathing, nonlabored ventilation and respiratory function stable Cardiovascular status: blood pressure returned to baseline and stable Postop Assessment: adequate PO intake Anesthetic complications: no   No notable events documented.   Last Vitals:  Vitals:   08/26/23 1800 08/26/23 1815  BP: (!) 113/59 109/60  Pulse: 98 93  Resp: 18 18  Temp:    SpO2: 95% 92%    Last Pain:  Vitals:   08/26/23 1752  TempSrc:   PainSc: 4                  Samarah Hogle

## 2023-08-26 NOTE — Assessment & Plan Note (Signed)
 Secondary to fall Orthopedic service has been consulted, Dr. Othella to take patient to the OR today Symptomatic support: Hydrocodone -acetaminophen  5-325 mg p.o. every 6 hours as needed for moderate pain, 2 days ordered; morphine  1 mg IV every 2 hours as needed for severe pain, 1 day ordered AM team to reevaluate patient at bedside for continued opioid pain requirements Fall precaution PT, OT consulted to initiate on 08/27/2023

## 2023-08-26 NOTE — H&P (Signed)
 History and Physical   Jenna Franklin FMW:[id number] DOB: Sep 03, 1940 DOA: 08/26/2023  PCP: Justus Leita DEL, MD  Patient coming from: Dr. Jinny, GI specialist  I have personally briefly reviewed patient's old medical records in [facility] EMR.  Chief Concern: Fall, right hip pain  HPI: Jenna Franklin is an 83 year old female with history of liver cirrhosis with splenomegaly and esophageal varices, anxiety, GERD, who presents emergency department for chief concerns of fall with resulting right hip pain.  Vitals in the ED showed T of 98.1, rr 17, heart rate 100, blood pressure 112/52, SpO2 100% on room air.  Serum sodium is 136, potassium 3.7, chloride 105, bicarb 20, BUN of 8, serum creatinine of 0.64, EGFR greater than 60, nonfasting blood glucose 219, WBC 6.5, hemoglobin 10.6, platelets of 142.  CK was 45.  HS troponin is 5.  Ammonia level was 23. T. bili was 1.6.  ED treatment: Fentanyl  50 mcg IV one-time dose, sodium chloride  1 L bolus. ------------------------------ At bedside, patient was able to tell me her first and last name.  She was not able to tell me her age.  She was able to tell me that she is in the hospital.  She was not able to tell me the current calendar year, the current month.  She states that the current president is Trump.  She reports she did not know how she fell or what caused her to fall.  She reports after the fall she could not get up due to right hip pain.  She denies chest pain, shortness of breath, nausea, vomiting, dysuria, hematuria, diarrhea.  Social history: She lives at home.  She denies tobacco, EtOH, recreational drug use.  She is retired.  ROS: Constitutional: no weight change, no fever ENT/Mouth: no sore throat, no rhinorrhea Eyes: no eye pain, no vision changes Cardiovascular: no chest pain, no dyspnea,  no edema, no palpitations Respiratory: no cough, no sputum, no wheezing Gastrointestinal: no nausea, no vomiting, no diarrhea, no  constipation Genitourinary: no urinary incontinence, no dysuria, no hematuria Musculoskeletal: no arthralgias, no myalgias, + right hip pain Skin: no skin lesions, no pruritus, Neuro: + weakness, no loss of consciousness, no syncope Psych: no anxiety, no depression, no decrease appetite Heme/Lymph: no bruising, no bleeding  ED Course: Discussed with EDP, patient requiring hospitalization for chief concerns of right femoral neck angulated fracture.  Assessment/Plan  Principal Problem:   Right femoral fracture (HCC) Active Problems:   Anxiety   Acid reflux   Cirrhosis of liver with ascites (HCC)   Esophageal varices in cirrhosis (HCC)   Colonic mass   Weight loss   Assessment and Plan:  * Right femoral fracture (HCC) Secondary to fall Orthopedic service has been consulted, Dr. Othella to take patient to the OR today Symptomatic support: Hydrocodone -acetaminophen  5-325 mg p.o. every 6 hours as needed for moderate pain, 2 days ordered; morphine  1 mg IV every 2 hours as needed for severe pain, 1 day ordered AM team to reevaluate patient at bedside for continued opioid pain requirements Fall precaution PT, OT consulted to initiate on 08/27/2023  Weight loss Patient was not able to quantify how much weight she has lost, she only states that it is a lot Given CT read of colonic mass with omental implants and small pulmonary nodules, high suspicion for malignancy and metastatic disease cannot be excluded at this time Discussed CT finding with son at bedside Chyrl, with patient's permission  Colonic mass Discussed extensively with son, Chyrl at  bedside with patient's permission.   He states he will talk to his mother further after hip fracture to determine her goals of care  Esophageal varices in cirrhosis [facility]) Continue outpatient follow-up with GI specialist as appropriate Appears compensated at this time and not in acute exacerbation  Anxiety PDMP reviewed Currently no active  prescription outpatient Lorazepam  0.5 mg IV every 6 hours as needed for anxiety, seizure, 2 doses ordered  Chart reviewed.   DVT prophylaxis: Heparin  5000 units subcutaneous every 8 hours Code Status: Full code, son states that he will talk about CODE STATUS further with his mother after procedure.  I stated that he can change the CODE STATUS and let nursing know and/or attending provider tomorrow now Diet: N.p.o. Family Communication: Updated son, Chyrl at bedside with patient's permission Disposition Plan: Pending clinical course Consults called: Orthopedic service Admission status: Telemetry medical, inpatient  Past Medical History:  Diagnosis Date   Adjustment disorder with anxious mood 10/05/2013   Anxiety    Arthritis    back   Bronchitis    currently   Calculus of kidney 03/25/2015   CHF (congestive heart failure) (HCC)    pt says she thinks she has this   Cirrhosis of liver (HCC) 2016   Depression    Esophageal varices (HCC)    Esophageal varices (HCC)    Gastritis without bleeding 2019   GERD (gastroesophageal reflux disease)    H/O: depression    History of kidney stones    Hx of sinus tachycardia 07/06/2017   Hypertension    Nerve root pain 12/11/2011   Non-alcoholic fatty liver disease    Nonalcoholic fatty liver disease    Osteopenia    Osteopenia    Reflux    in past   Spinal stenosis    Tachycardia 03/25/2015   Tendinopathy of right gluteus medius 01/01/2017   Vertigo    3 yrs ago   Vitamin D deficiency    Vitamin D deficiency    Past Surgical History:  Procedure Laterality Date   ABDOMINAL HYSTERECTOMY     APPENDECTOMY     BREAST BIOPSY Right    neg needle bx   CATARACT EXTRACTION W/PHACO Right 01/09/2019   Procedure: CATARACT EXTRACTION PHACO AND INTRAOCULAR LENS PLACEMENT (IOC) RIGHT 8.60,        00:53.3;  Surgeon: Myrna Adine Anes, MD;  Location: [facility];  Service: Ophthalmology;  Laterality: Right;   CATARACT EXTRACTION W/PHACO Left  01/30/2019   Procedure: CATARACT EXTRACTION PHACO AND INTRAOCULAR LENS PLACEMENT (IOC) LEFT, 2.59, 00:25.5;  Surgeon: Myrna Adine Anes, MD;  Location: [facility];  Service: Ophthalmology;  Laterality: Left;   COMBINED HYSTERECTOMY ABDOMINAL W/ A&P REPAIR / OOPHORECTOMY     ESOPHAGOGASTRODUODENOSCOPY (EGD) WITH PROPOFOL  N/A 06/17/2015   Procedure: ESOPHAGOGASTRODUODENOSCOPY (EGD) WITH PROPOFOL ;  Surgeon: Rogelia Copping, MD;  Location: [facility];  Service: Endoscopy;  Laterality: N/A;   ESOPHAGOGASTRODUODENOSCOPY (EGD) WITH PROPOFOL  N/A 06/17/2017   Procedure: ESOPHAGOGASTRODUODENOSCOPY (EGD) WITH PROPOFOL ;  Surgeon: Copping Rogelia, MD;  Location: [facility];  Service: Endoscopy;  Laterality: N/A;   LAPAROSCOPIC CHOLECYSTECTOMY     TONSILLECTOMY     Social History:  reports that she has never smoked. She has never used smokeless tobacco. She reports that she does not drink alcohol and does not use drugs.  Allergies  Allergen Reactions   Baclofen  Anxiety   Codeine Nausea And Vomiting   Sulfa Antibiotics Nausea Only   Prednisone  Anxiety   Family History  Problem Relation Age of Onset   Heart disease Mother    Heart disease Father    Heart attack Paternal Grandfather    Asthma Son 68   Hypertension Daughter    Breast cancer Maternal Aunt    Family history: Family history reviewed and not pertinent  Prior to Admission medications   Medication Sig Start Date End Date Taking? Authorizing Provider  fluticasone  (FLONASE ) 50 MCG/ACT nasal spray Place 2 sprays into both nostrils daily. 10/08/15   Justus Leita DEL, MD  furosemide  (LASIX ) 40 MG tablet TAKE ONE (1) TABLET BY MOUTH ONCE DAILY 08/05/21   Jinny Carmine, MD  nadolol  (CORGARD ) 20 MG tablet TAKE (1) TABLET BY MOUTH EVERY DAY 06/30/22   Jinny Carmine, MD   Physical Exam: Vitals:   08/26/23 1330 08/26/23 1400 08/26/23 1430 08/26/23 1542  BP: 108/82 (!) 115/55 119/64 134/67  Pulse: (!) 102 99 (!) 102 (!) 104  Resp:  (!) 26 (!) 21 (!) 22 20  Temp:    (!) 97.5 F (36.4 C)  TempSrc:    Temporal  SpO2: 100% 100% 100% 98%  Weight:      Height:       Constitutional: appears frail, age-appropriate, calm Eyes: PERRL, lids and conjunctivae normal ENMT: Mucous membranes are moist. Posterior pharynx clear of any exudate or lesions. Age-appropriate dentition. Hearing appropriate Neck: normal, supple, no masses, no thyromegaly Respiratory: clear to auscultation bilaterally, no wheezing, no crackles. Normal respiratory effort. No accessory muscle use.  Cardiovascular: Regular rate and rhythm, no murmurs / rubs / gallops. No extremity edema. 2+ pedal pulses. No carotid bruits.  Abdomen: no tenderness, no masses palpated, no hepatosplenomegaly. Bowel sounds positive.  Musculoskeletal: no clubbing / cyanosis. No joint deformity upper and lower extremities.  Decreased ROM of the right lower extremity, no contractures, no atrophy. Normal muscle tone.  Skin: no rashes, lesions, ulcers. No induration Neurologic: Sensation intact. Strength 5/5 in bilateral upper extremity and left lower extremity. Psychiatric: Lacks judgment and insight. Alert and oriented x self, location of hospital, and current president. Normal mood.   EKG: independently reviewed, showing sinus rhythm with rate of 98, QTc 466  Chest x-ray on Admission: I personally reviewed and I agree with radiologist reading as below.  CT CHEST ABDOMEN PELVIS WO CONTRAST Result Date: 08/26/2023 CLINICAL DATA:  Trauma. EXAM: CT CHEST, ABDOMEN AND PELVIS WITHOUT CONTRAST TECHNIQUE: Multidetector CT imaging of the chest, abdomen and pelvis was performed following the standard protocol without IV contrast. RADIATION DOSE REDUCTION: This exam was performed according to the departmental dose-optimization program which includes automated exposure control, adjustment of the mA and/or kV according to patient size and/or use of iterative reconstruction technique. COMPARISON:   None Available. FINDINGS: Evaluation of this exam is limited in the absence of intravenous contrast. CT CHEST FINDINGS Cardiovascular: There is no cardiomegaly or pericardial effusion. The thoracic aorta and the central pulmonary arteries are grossly unremarkable. There is a left-sided aortic arch with aberrant right subclavian artery anatomy. Mediastinum/Nodes: No hilar or mediastinal adenopathy. Small hiatal hernia. The esophagus is grossly unremarkable. No mediastinal fluid collection. Lungs/Pleura: Minimal bibasilar subpleural atelectasis. There is a 7 mm right middle lobe and a 4 mm right middle lobe nodules. A 9 mm nodular density at the right lung base along the diaphragm. No consolidative changes. There is no pleural effusion pneumothorax. The central airways are patent. Musculoskeletal: Osteopenia. Old T12 compression fracture. No acute osseous pathology. CT ABDOMEN PELVIS FINDINGS No intra-abdominal free air.  Small ascites. Hepatobiliary:  Cirrhosis.  No biliary dilatation.  Cholecystectomy. Pancreas: Unremarkable. No pancreatic ductal dilatation or surrounding inflammatory changes. Spleen: Splenomegaly measuring 15 cm in length. Adrenals/Urinary Tract: The adrenal glands unremarkable. Small right renal upper pole cyst. There is no hydronephrosis or nephrolithiasis on either side. The visualized ureters and urinary bladder marked. Stomach/Bowel: There is sigmoid diverticulosis. Diffuse thickened appearance of the colon may be related to hepatic colopathy and size. Colitis is not excluded. There is however an area of irregularity and thickening of the colonic wall with nodular protrusion involving the proximal transverse colon (axial 70/4 and coronal 28/7) concerning for malignancy. Further evaluation with colonoscopy recommended. There is no bowel obstruction. The appendix is not well visualized due to ascites. Vascular/Lymphatic: Mild aortoiliac atherosclerotic disease. The IVC is unremarkable. No portal  venous gas. No adenopathy. Several small nodules adjacent to the proximal transverse colon as well as small omental nodules in the right upper abdomen (64/4) metastasis. Reproductive: Hysterectomy. Other: Soft tissue nodules in the anterior abdomen and involving the right rectus sheath measure up to 3.8 x 2.0 cm consistent with metastatic implants. Musculoskeletal: Osteopenia with degenerative changes of the spine. Old L1 compression fracture with anterior wedging. There is mildly angulated fracture of the right femoral neck involving the basicervical and trochanteric region. IMPRESSION: 1. Mildly angulated fracture of the right femoral neck. 2. No acute/traumatic intrathoracic pathology. 3. Cirrhosis with splenomegaly and small ascites. 4. Sigmoid diverticulosis. 5. Findings concerning for colonic mass/malignancy of the proximal transverse colon with omental implants. Further evaluation with colonoscopy is recommended. 6. Small pulmonary nodules, metastatic disease is not excluded. 7.  Aortic Atherosclerosis (ICD10-I70.0). Electronically Signed   By: Vanetta Chou M.D.   On: 08/26/2023 14:58   CT T-SPINE NO CHARGE Result Date: 08/26/2023 CLINICAL DATA:  Trauma. EXAM: CT THORACIC AND LUMBAR SPINE WITHOUT CONTRAST TECHNIQUE: Multidetector CT imaging of the thoracic and lumbar spine was performed without contrast. Multiplanar CT image reconstructions were also generated. RADIATION DOSE REDUCTION: This exam was performed according to the departmental dose-optimization program which includes automated exposure control, adjustment of the mA and/or kV according to patient size and/or use of iterative reconstruction technique. COMPARISON:  Lumbar spine CT dated 08/19/2020. FINDINGS: CT THORACIC SPINE FINDINGS Alignment: No acute subluxation. Vertebrae: No acute fracture. The bones are osteopenic. Old T12 compression fracture with approximately 50% loss of vertebral body height anteriorly and anterior wedging. This is  relatively similar to the lumbar spine CT of 08/19/2020. Paraspinal and other soft tissues: Negative. Disc levels: No acute findings with degenerative changes. CT LUMBAR SPINE FINDINGS Segmentation: 5 lumbar type vertebrae. Alignment: No acute subluxation. Vertebrae: Old appearing L1 compression fracture with approximately 50% loss of vertebral body height and anterior wedging, new since the prior CT. No definite acute fracture. The bones are osteopenic. Paraspinal and other soft tissues: Negative. Disc levels: Degenerative changes. IMPRESSION: 1. No acute/traumatic thoracic or lumbar spine pathology. 2. Old T12 and L1 compression fractures. Electronically Signed   By: Vanetta Chou M.D.   On: 08/26/2023 14:47   CT L-SPINE NO CHARGE Result Date: 08/26/2023 CLINICAL DATA:  Trauma. EXAM: CT THORACIC AND LUMBAR SPINE WITHOUT CONTRAST TECHNIQUE: Multidetector CT imaging of the thoracic and lumbar spine was performed without contrast. Multiplanar CT image reconstructions were also generated. RADIATION DOSE REDUCTION: This exam was performed according to the departmental dose-optimization program which includes automated exposure control, adjustment of the mA and/or kV according to patient size and/or use of iterative reconstruction technique. COMPARISON:  Lumbar spine CT dated 08/19/2020.  FINDINGS: CT THORACIC SPINE FINDINGS Alignment: No acute subluxation. Vertebrae: No acute fracture. The bones are osteopenic. Old T12 compression fracture with approximately 50% loss of vertebral body height anteriorly and anterior wedging. This is relatively similar to the lumbar spine CT of 08/19/2020. Paraspinal and other soft tissues: Negative. Disc levels: No acute findings with degenerative changes. CT LUMBAR SPINE FINDINGS Segmentation: 5 lumbar type vertebrae. Alignment: No acute subluxation. Vertebrae: Old appearing L1 compression fracture with approximately 50% loss of vertebral body height and anterior wedging, new  since the prior CT. No definite acute fracture. The bones are osteopenic. Paraspinal and other soft tissues: Negative. Disc levels: Degenerative changes. IMPRESSION: 1. No acute/traumatic thoracic or lumbar spine pathology. 2. Old T12 and L1 compression fractures. Electronically Signed   By: Vanetta Chou M.D.   On: 08/26/2023 14:47   DG Elbow Complete Right Result Date: 08/26/2023 CLINICAL DATA:  Fall. EXAM: RIGHT ELBOW - COMPLETE 3+ VIEW COMPARISON:  08/19/2020. FINDINGS: There is no evidence of fracture, dislocation, or joint effusion. Mild degenerative spurring of the coronoid process of the ulna. Soft tissues are unremarkable. IMPRESSION: No acute fracture or dislocation of the right elbow. Electronically Signed   By: Harrietta Sherry M.D.   On: 08/26/2023 14:08   DG FEMUR, MIN 2 VIEWS RIGHT Result Date: 08/26/2023 CLINICAL DATA:  Right hip pain after fall. EXAM: RIGHT FEMUR 2 VIEWS COMPARISON:  Same-day pelvic radiographs dated 08/26/2023 at 12:03 p.m. FINDINGS: Diffuse osseous demineralization. Redemonstrated probable basicervical fracture of the right proximal femur. Intertrochanteric extension cannot be excluded. No additional fracture identified. No dislocation. Peripheral vascular calcifications. IMPRESSION: Redemonstrated probable basicervical fracture of the right proximal femur. Intertrochanteric extension cannot be excluded. No additional fracture identified. Electronically Signed   By: Harrietta Sherry M.D.   On: 08/26/2023 14:06   CT Head Wo Contrast Result Date: 08/26/2023 CLINICAL DATA:  Altered mental status.  Fall. EXAM: CT HEAD WITHOUT CONTRAST CT CERVICAL SPINE WITHOUT CONTRAST TECHNIQUE: Multidetector CT imaging of the head and cervical spine was performed following the standard protocol without intravenous contrast. Multiplanar CT image reconstructions of the cervical spine were also generated. RADIATION DOSE REDUCTION: This exam was performed according to the departmental  dose-optimization program which includes automated exposure control, adjustment of the mA and/or kV according to patient size and/or use of iterative reconstruction technique. COMPARISON:  None Available. FINDINGS: CT HEAD FINDINGS Brain: No acute intracranial hemorrhage. No focal mass lesion. No CT evidence of acute infarction. No midline shift or mass effect. No hydrocephalus. Basilar cisterns are patent. There are periventricular and subcortical white matter hypodensities. Generalized cortical atrophy. Vascular: No hyperdense vessel or unexpected calcification. Skull: Normal. Negative for fracture or focal lesion. Sinuses/Orbits: Paranasal sinuses and mastoid air cells are clear. Orbits are clear. Other: None. CT CERVICAL SPINE FINDINGS Alignment: Normal alignment of the cervical vertebral bodies. Skull base and vertebrae: Normal craniocervical junction. No loss of vertebral body height or disc height. Normal facet articulation. No evidence of fracture. Soft tissues and spinal canal: No prevertebral soft tissue swelling. No perispinal or epidural hematoma. Disc levels: Bulky RIGHT facet hypertrophy from C3 to C5. Anterior endplate spurring R4-R2. No acute findings Upper chest: Clear Other: None IMPRESSION: HEAD CT: 1. No acute intracranial findings. 2. Atrophy and white matter microvascular disease. CERVICAL SPINE CT: 1. No cervical spine fracture. 2. Cervical spondylosis. Electronically Signed   By: Jackquline Boxer M.D.   On: 08/26/2023 12:32   CT Cervical Spine Wo Contrast Result Date: 08/26/2023 CLINICAL DATA:  Altered mental status.  Fall. EXAM: CT HEAD WITHOUT CONTRAST CT CERVICAL SPINE WITHOUT CONTRAST TECHNIQUE: Multidetector CT imaging of the head and cervical spine was performed following the standard protocol without intravenous contrast. Multiplanar CT image reconstructions of the cervical spine were also generated. RADIATION DOSE REDUCTION: This exam was performed according to the departmental  dose-optimization program which includes automated exposure control, adjustment of the mA and/or kV according to patient size and/or use of iterative reconstruction technique. COMPARISON:  None Available. FINDINGS: CT HEAD FINDINGS Brain: No acute intracranial hemorrhage. No focal mass lesion. No CT evidence of acute infarction. No midline shift or mass effect. No hydrocephalus. Basilar cisterns are patent. There are periventricular and subcortical white matter hypodensities. Generalized cortical atrophy. Vascular: No hyperdense vessel or unexpected calcification. Skull: Normal. Negative for fracture or focal lesion. Sinuses/Orbits: Paranasal sinuses and mastoid air cells are clear. Orbits are clear. Other: None. CT CERVICAL SPINE FINDINGS Alignment: Normal alignment of the cervical vertebral bodies. Skull base and vertebrae: Normal craniocervical junction. No loss of vertebral body height or disc height. Normal facet articulation. No evidence of fracture. Soft tissues and spinal canal: No prevertebral soft tissue swelling. No perispinal or epidural hematoma. Disc levels: Bulky RIGHT facet hypertrophy from C3 to C5. Anterior endplate spurring R4-R2. No acute findings Upper chest: Clear Other: None IMPRESSION: HEAD CT: 1. No acute intracranial findings. 2. Atrophy and white matter microvascular disease. CERVICAL SPINE CT: 1. No cervical spine fracture. 2. Cervical spondylosis. Electronically Signed   By: Jackquline Boxer M.D.   On: 08/26/2023 12:32   DG Chest Portable 1 View Result Date: 08/26/2023 CLINICAL DATA:  Fall, right hip fracture EXAM: PORTABLE CHEST 1 VIEW COMPARISON:  08/19/2020 FINDINGS: The patient is rotated to the left on today's radiograph, reducing diagnostic sensitivity and specificity. Low lung volumes are present, causing crowding of the pulmonary vasculature. Age indeterminate deformity of the left sixth rib posterolaterally, correlate with any tenderness in this vicinity in determining  acuity. Accounting for the low lung volumes, the lungs appear clear. Cardiac and mediastinal margins appear normal. IMPRESSION: 1. Age indeterminate fracture of the left sixth rib posterolaterally, correlate with any tenderness in this vicinity in determining acuity. 2. Low lung volumes. Electronically Signed   By: Ryan Salvage M.D.   On: 08/26/2023 12:26   DG Hip Unilat With Pelvis 2-3 Views Right Result Date: 08/26/2023 CLINICAL DATA:  Fall, right hip pain EXAM: DG HIP (WITH OR WITHOUT PELVIS) 2-3V RIGHT COMPARISON:  None Available. FINDINGS: Suboptimal positioning. High suspicion for fracture, either basicervical or intertrochanteric. Intra-articular femoral neck fracture favored but not absolutely certain. Consider CT of the right hip for differentiation. Spurring of both acetabula.  Bony demineralization. IMPRESSION: 1. Right proximal femoral fracture likely basicervical but possibly intertrochanteric. CT could help differentiate. 2. Bony demineralization. 3. Spurring of both acetabula. Electronically Signed   By: Ryan Salvage M.D.   On: 08/26/2023 12:24   Labs on Admission: I have personally reviewed following labs  CBC: Recent Labs  Lab 08/26/23 1146  WBC 6.5  HGB 10.6*  HCT 32.7*  MCV 90.6  PLT 142*   Basic Metabolic Panel: Recent Labs  Lab 08/26/23 1146  NA 136  K 3.7  CL 105  CO2 20*  GLUCOSE 219*  BUN 8  CREATININE 0.64  CALCIUM 8.2*   GFR: Estimated Creatinine Clearance: 44.1 mL/min (by C-G formula based on SCr of 0.64 mg/dL).  Liver Function Tests: Recent Labs  Lab 08/26/23 1146  AST 40  ALT  17  ALKPHOS 59  BILITOT 1.6*  PROT 6.5  ALBUMIN 2.9*   Recent Labs  Lab 08/26/23 1242  AMMONIA 23   Cardiac Enzymes: Recent Labs  Lab 08/26/23 1146  CKTOTAL 45   Urine analysis:    Component Value Date/Time   COLORURINE STRAW (A) 04/21/2019 1455   APPEARANCEUR CLEAR 04/21/2019 1455   LABSPEC 1.010 04/21/2019 1455   PHURINE 6.0 04/21/2019 1455    GLUCOSEU NEGATIVE 04/21/2019 1455   HGBUR NEGATIVE 04/21/2019 1455   BILIRUBINUR neg 09/06/2019 0833   KETONESUR NEGATIVE 04/21/2019 1455   PROTEINUR Negative 09/06/2019 0833   PROTEINUR NEGATIVE 04/21/2019 1455   UROBILINOGEN 0.2 09/06/2019 0833   NITRITE neg 09/06/2019 0833   NITRITE NEGATIVE 04/21/2019 1455   LEUKOCYTESUR Negative 09/06/2019 0833   LEUKOCYTESUR NEGATIVE 04/21/2019 1455   This document was prepared using Dragon Voice Recognition software and may include unintentional dictation errors.  Dr. Sherre [facility]  If 7PM-7AM, please contact overnight-coverage provider If 7AM-7PM, please contact day attending provider [email or web address]  08/26/2023, 3:58 PM

## 2023-08-26 NOTE — Consult Note (Signed)
 ORTHOPAEDIC CONSULTATION  REQUESTING PHYSICIAN: No att. providers found  Chief Complaint:   Right intertrochanteric femur fracture basicervical  History of Present Illness: Jenna Franklin is a 83 y.o. female past medical history significant for cirrhosis with esophageal varices, GERD, anxiety.  Patient reports she walks at baseline with a cane or a rolling walker due to back issues.  She reports she had a mechanical fall earlier today landing on her right side.  A neighbor was able to help get EMS to her and bring her to the emergency room for evaluation.  She denies any head strike or loss of consciousness.  Denies any pre-existing hip pain on the right side prior to the fall.  Denies any anticoagulation use.   Past Medical History:  Diagnosis Date   Adjustment disorder with anxious mood 10/05/2013   Anxiety    Arthritis    back   Bronchitis    currently   Calculus of kidney 03/25/2015   CHF (congestive heart failure) (HCC)    pt says she thinks she has this   Cirrhosis of liver (HCC) 2016   Depression    Esophageal varices (HCC)    Esophageal varices (HCC)    Gastritis without bleeding 2019   GERD (gastroesophageal reflux disease)    H/O: depression    History of kidney stones    Hx of sinus tachycardia 07/06/2017   Hypertension    Nerve root pain 12/11/2011   Non-alcoholic fatty liver disease    Nonalcoholic fatty liver disease    Osteopenia    Osteopenia    Reflux    in past   Spinal stenosis    Tachycardia 03/25/2015   Tendinopathy of right gluteus medius 01/01/2017   Vertigo    3 yrs ago   Vitamin D deficiency    Vitamin D deficiency    Past Surgical History:  Procedure Laterality Date   ABDOMINAL HYSTERECTOMY     APPENDECTOMY     BREAST BIOPSY Right    neg needle bx   CATARACT EXTRACTION W/PHACO Right 01/09/2019   Procedure: CATARACT EXTRACTION PHACO AND INTRAOCULAR LENS PLACEMENT (IOC) RIGHT  8.60,        00:53.3;  Surgeon: Myrna Adine Anes, MD;  Location: [facility];  Service: Ophthalmology;  Laterality: Right;   CATARACT EXTRACTION W/PHACO Left 01/30/2019   Procedure: CATARACT EXTRACTION PHACO AND INTRAOCULAR LENS PLACEMENT (IOC) LEFT, 2.59, 00:25.5;  Surgeon: Myrna Adine Anes, MD;  Location: [facility];  Service: Ophthalmology;  Laterality: Left;   COMBINED HYSTERECTOMY ABDOMINAL W/ A&P REPAIR / OOPHORECTOMY     ESOPHAGOGASTRODUODENOSCOPY (EGD) WITH PROPOFOL  N/A 06/17/2015   Procedure: ESOPHAGOGASTRODUODENOSCOPY (EGD) WITH PROPOFOL ;  Surgeon: Rogelia Copping, MD;  Location: [facility];  Service: Endoscopy;  Laterality: N/A;   ESOPHAGOGASTRODUODENOSCOPY (EGD) WITH PROPOFOL  N/A 06/17/2017   Procedure: ESOPHAGOGASTRODUODENOSCOPY (EGD) WITH PROPOFOL ;  Surgeon: Copping Rogelia, MD;  Location: [facility];  Service: Endoscopy;  Laterality: N/A;   LAPAROSCOPIC CHOLECYSTECTOMY     TONSILLECTOMY     Social History   Socioeconomic History   Marital status: Widowed    Spouse name: Not on file   Number of children: Not on file   Years of education: Not on file   Highest education level: Not on file  Occupational History   Not on file  Tobacco Use   Smoking status: Never   Smokeless tobacco: Never   Tobacco comments:    smoking cessation materials not required  Vaping Use   Vaping status:  Never Used  Substance and Sexual Activity   Alcohol use: No    Alcohol/week: 0.0 standard drinks of alcohol   Drug use: No   Sexual activity: Yes    Birth control/protection: Post-menopausal  Other Topics Concern   Not on file  Social History Narrative   Pt lives alone   Social Drivers of Health   Financial Resource Strain: Low Risk  (08/12/2022)   Overall Financial Resource Strain (CARDIA)    Difficulty of Paying Living Expenses: Not hard at all  Food Insecurity: No Food Insecurity (08/12/2022)   Hunger Vital Sign    Worried About Running Out of Food in the  Last Year: Never true    Ran Out of Food in the Last Year: Never true  Transportation Needs: No Transportation Needs (09/28/2022)   PRAPARE - Administrator, Civil Service (Medical): No    Lack of Transportation (Non-Medical): No  Physical Activity: Inactive (08/12/2022)   Exercise Vital Sign    Days of Exercise per Week: 0 days    Minutes of Exercise per Session: 0 min  Stress: No Stress Concern Present (08/12/2022)   [employer] of Occupational Health - Occupational Stress Questionnaire    Feeling of Stress : Only a little  Social Connections: Socially Isolated (08/12/2022)   Social Connection and Isolation Panel    Frequency of Communication with Friends and Family: More than three times a week    Frequency of Social Gatherings with Friends and Family: More than three times a week    Attends Religious Services: Never    Database administrator or Organizations: No    Attends Banker Meetings: Never    Marital Status: Widowed   Family History  Problem Relation Age of Onset   Heart disease Mother    Heart disease Father    Heart attack Paternal Grandfather    Asthma Son 40   Hypertension Daughter    Breast cancer Maternal Aunt    Allergies  Allergen Reactions   Baclofen  Anxiety   Codeine Nausea And Vomiting   Sulfa Antibiotics Nausea Only   Prednisone  Anxiety   Prior to Admission medications   Medication Sig Start Date End Date Taking? Authorizing Provider  fluticasone  (FLONASE ) 50 MCG/ACT nasal spray Place 2 sprays into both nostrils daily. 10/08/15   Justus Leita DEL, MD  furosemide  (LASIX ) 40 MG tablet TAKE ONE (1) TABLET BY MOUTH ONCE DAILY 08/05/21   Jinny Carmine, MD  nadolol  (CORGARD ) 20 MG tablet TAKE (1) TABLET BY MOUTH EVERY DAY 06/30/22   Jinny Carmine, MD   CT CHEST ABDOMEN PELVIS WO CONTRAST Result Date: 08/26/2023 CLINICAL DATA:  Trauma. EXAM: CT CHEST, ABDOMEN AND PELVIS WITHOUT CONTRAST TECHNIQUE: Multidetector CT imaging of the  chest, abdomen and pelvis was performed following the standard protocol without IV contrast. RADIATION DOSE REDUCTION: This exam was performed according to the departmental dose-optimization program which includes automated exposure control, adjustment of the mA and/or kV according to patient size and/or use of iterative reconstruction technique. COMPARISON:  None Available. FINDINGS: Evaluation of this exam is limited in the absence of intravenous contrast. CT CHEST FINDINGS Cardiovascular: There is no cardiomegaly or pericardial effusion. The thoracic aorta and the central pulmonary arteries are grossly unremarkable. There is a left-sided aortic arch with aberrant right subclavian artery anatomy. Mediastinum/Nodes: No hilar or mediastinal adenopathy. Small hiatal hernia. The esophagus is grossly unremarkable. No mediastinal fluid collection. Lungs/Pleura: Minimal bibasilar subpleural atelectasis. There is a 7 mm right  middle lobe and a 4 mm right middle lobe nodules. A 9 mm nodular density at the right lung base along the diaphragm. No consolidative changes. There is no pleural effusion pneumothorax. The central airways are patent. Musculoskeletal: Osteopenia. Old T12 compression fracture. No acute osseous pathology. CT ABDOMEN PELVIS FINDINGS No intra-abdominal free air.  Small ascites. Hepatobiliary: Cirrhosis.  No biliary dilatation.  Cholecystectomy. Pancreas: Unremarkable. No pancreatic ductal dilatation or surrounding inflammatory changes. Spleen: Splenomegaly measuring 15 cm in length. Adrenals/Urinary Tract: The adrenal glands unremarkable. Small right renal upper pole cyst. There is no hydronephrosis or nephrolithiasis on either side. The visualized ureters and urinary bladder marked. Stomach/Bowel: There is sigmoid diverticulosis. Diffuse thickened appearance of the colon may be related to hepatic colopathy and size. Colitis is not excluded. There is however an area of irregularity and thickening of the  colonic wall with nodular protrusion involving the proximal transverse colon (axial 70/4 and coronal 28/7) concerning for malignancy. Further evaluation with colonoscopy recommended. There is no bowel obstruction. The appendix is not well visualized due to ascites. Vascular/Lymphatic: Mild aortoiliac atherosclerotic disease. The IVC is unremarkable. No portal venous gas. No adenopathy. Several small nodules adjacent to the proximal transverse colon as well as small omental nodules in the right upper abdomen (64/4) metastasis. Reproductive: Hysterectomy. Other: Soft tissue nodules in the anterior abdomen and involving the right rectus sheath measure up to 3.8 x 2.0 cm consistent with metastatic implants. Musculoskeletal: Osteopenia with degenerative changes of the spine. Old L1 compression fracture with anterior wedging. There is mildly angulated fracture of the right femoral neck involving the basicervical and trochanteric region. IMPRESSION: 1. Mildly angulated fracture of the right femoral neck. 2. No acute/traumatic intrathoracic pathology. 3. Cirrhosis with splenomegaly and small ascites. 4. Sigmoid diverticulosis. 5. Findings concerning for colonic mass/malignancy of the proximal transverse colon with omental implants. Further evaluation with colonoscopy is recommended. 6. Small pulmonary nodules, metastatic disease is not excluded. 7.  Aortic Atherosclerosis (ICD10-I70.0). Electronically Signed   By: Vanetta Chou M.D.   On: 08/26/2023 14:58   CT T-SPINE NO CHARGE Result Date: 08/26/2023 CLINICAL DATA:  Trauma. EXAM: CT THORACIC AND LUMBAR SPINE WITHOUT CONTRAST TECHNIQUE: Multidetector CT imaging of the thoracic and lumbar spine was performed without contrast. Multiplanar CT image reconstructions were also generated. RADIATION DOSE REDUCTION: This exam was performed according to the departmental dose-optimization program which includes automated exposure control, adjustment of the mA and/or kV according  to patient size and/or use of iterative reconstruction technique. COMPARISON:  Lumbar spine CT dated 08/19/2020. FINDINGS: CT THORACIC SPINE FINDINGS Alignment: No acute subluxation. Vertebrae: No acute fracture. The bones are osteopenic. Old T12 compression fracture with approximately 50% loss of vertebral body height anteriorly and anterior wedging. This is relatively similar to the lumbar spine CT of 08/19/2020. Paraspinal and other soft tissues: Negative. Disc levels: No acute findings with degenerative changes. CT LUMBAR SPINE FINDINGS Segmentation: 5 lumbar type vertebrae. Alignment: No acute subluxation. Vertebrae: Old appearing L1 compression fracture with approximately 50% loss of vertebral body height and anterior wedging, new since the prior CT. No definite acute fracture. The bones are osteopenic. Paraspinal and other soft tissues: Negative. Disc levels: Degenerative changes. IMPRESSION: 1. No acute/traumatic thoracic or lumbar spine pathology. 2. Old T12 and L1 compression fractures. Electronically Signed   By: Vanetta Chou M.D.   On: 08/26/2023 14:47   CT L-SPINE NO CHARGE Result Date: 08/26/2023 CLINICAL DATA:  Trauma. EXAM: CT THORACIC AND LUMBAR SPINE WITHOUT CONTRAST TECHNIQUE: Multidetector CT  imaging of the thoracic and lumbar spine was performed without contrast. Multiplanar CT image reconstructions were also generated. RADIATION DOSE REDUCTION: This exam was performed according to the departmental dose-optimization program which includes automated exposure control, adjustment of the mA and/or kV according to patient size and/or use of iterative reconstruction technique. COMPARISON:  Lumbar spine CT dated 08/19/2020. FINDINGS: CT THORACIC SPINE FINDINGS Alignment: No acute subluxation. Vertebrae: No acute fracture. The bones are osteopenic. Old T12 compression fracture with approximately 50% loss of vertebral body height anteriorly and anterior wedging. This is relatively similar to the  lumbar spine CT of 08/19/2020. Paraspinal and other soft tissues: Negative. Disc levels: No acute findings with degenerative changes. CT LUMBAR SPINE FINDINGS Segmentation: 5 lumbar type vertebrae. Alignment: No acute subluxation. Vertebrae: Old appearing L1 compression fracture with approximately 50% loss of vertebral body height and anterior wedging, new since the prior CT. No definite acute fracture. The bones are osteopenic. Paraspinal and other soft tissues: Negative. Disc levels: Degenerative changes. IMPRESSION: 1. No acute/traumatic thoracic or lumbar spine pathology. 2. Old T12 and L1 compression fractures. Electronically Signed   By: Vanetta Chou M.D.   On: 08/26/2023 14:47   DG Elbow Complete Right Result Date: 08/26/2023 CLINICAL DATA:  Fall. EXAM: RIGHT ELBOW - COMPLETE 3+ VIEW COMPARISON:  08/19/2020. FINDINGS: There is no evidence of fracture, dislocation, or joint effusion. Mild degenerative spurring of the coronoid process of the ulna. Soft tissues are unremarkable. IMPRESSION: No acute fracture or dislocation of the right elbow. Electronically Signed   By: Harrietta Sherry M.D.   On: 08/26/2023 14:08   DG FEMUR, MIN 2 VIEWS RIGHT Result Date: 08/26/2023 CLINICAL DATA:  Right hip pain after fall. EXAM: RIGHT FEMUR 2 VIEWS COMPARISON:  Same-day pelvic radiographs dated 08/26/2023 at 12:03 p.m. FINDINGS: Diffuse osseous demineralization. Redemonstrated probable basicervical fracture of the right proximal femur. Intertrochanteric extension cannot be excluded. No additional fracture identified. No dislocation. Peripheral vascular calcifications. IMPRESSION: Redemonstrated probable basicervical fracture of the right proximal femur. Intertrochanteric extension cannot be excluded. No additional fracture identified. Electronically Signed   By: Harrietta Sherry M.D.   On: 08/26/2023 14:06   CT Head Wo Contrast Result Date: 08/26/2023 CLINICAL DATA:  Altered mental status.  Fall. EXAM: CT HEAD  WITHOUT CONTRAST CT CERVICAL SPINE WITHOUT CONTRAST TECHNIQUE: Multidetector CT imaging of the head and cervical spine was performed following the standard protocol without intravenous contrast. Multiplanar CT image reconstructions of the cervical spine were also generated. RADIATION DOSE REDUCTION: This exam was performed according to the departmental dose-optimization program which includes automated exposure control, adjustment of the mA and/or kV according to patient size and/or use of iterative reconstruction technique. COMPARISON:  None Available. FINDINGS: CT HEAD FINDINGS Brain: No acute intracranial hemorrhage. No focal mass lesion. No CT evidence of acute infarction. No midline shift or mass effect. No hydrocephalus. Basilar cisterns are patent. There are periventricular and subcortical white matter hypodensities. Generalized cortical atrophy. Vascular: No hyperdense vessel or unexpected calcification. Skull: Normal. Negative for fracture or focal lesion. Sinuses/Orbits: Paranasal sinuses and mastoid air cells are clear. Orbits are clear. Other: None. CT CERVICAL SPINE FINDINGS Alignment: Normal alignment of the cervical vertebral bodies. Skull base and vertebrae: Normal craniocervical junction. No loss of vertebral body height or disc height. Normal facet articulation. No evidence of fracture. Soft tissues and spinal canal: No prevertebral soft tissue swelling. No perispinal or epidural hematoma. Disc levels: Bulky RIGHT facet hypertrophy from C3 to C5. Anterior endplate spurring R4-R2. No acute  findings Upper chest: Clear Other: None IMPRESSION: HEAD CT: 1. No acute intracranial findings. 2. Atrophy and white matter microvascular disease. CERVICAL SPINE CT: 1. No cervical spine fracture. 2. Cervical spondylosis. Electronically Signed   By: Jackquline Boxer M.D.   On: 08/26/2023 12:32   CT Cervical Spine Wo Contrast Result Date: 08/26/2023 CLINICAL DATA:  Altered mental status.  Fall. EXAM: CT HEAD  WITHOUT CONTRAST CT CERVICAL SPINE WITHOUT CONTRAST TECHNIQUE: Multidetector CT imaging of the head and cervical spine was performed following the standard protocol without intravenous contrast. Multiplanar CT image reconstructions of the cervical spine were also generated. RADIATION DOSE REDUCTION: This exam was performed according to the departmental dose-optimization program which includes automated exposure control, adjustment of the mA and/or kV according to patient size and/or use of iterative reconstruction technique. COMPARISON:  None Available. FINDINGS: CT HEAD FINDINGS Brain: No acute intracranial hemorrhage. No focal mass lesion. No CT evidence of acute infarction. No midline shift or mass effect. No hydrocephalus. Basilar cisterns are patent. There are periventricular and subcortical white matter hypodensities. Generalized cortical atrophy. Vascular: No hyperdense vessel or unexpected calcification. Skull: Normal. Negative for fracture or focal lesion. Sinuses/Orbits: Paranasal sinuses and mastoid air cells are clear. Orbits are clear. Other: None. CT CERVICAL SPINE FINDINGS Alignment: Normal alignment of the cervical vertebral bodies. Skull base and vertebrae: Normal craniocervical junction. No loss of vertebral body height or disc height. Normal facet articulation. No evidence of fracture. Soft tissues and spinal canal: No prevertebral soft tissue swelling. No perispinal or epidural hematoma. Disc levels: Bulky RIGHT facet hypertrophy from C3 to C5. Anterior endplate spurring R4-R2. No acute findings Upper chest: Clear Other: None IMPRESSION: HEAD CT: 1. No acute intracranial findings. 2. Atrophy and white matter microvascular disease. CERVICAL SPINE CT: 1. No cervical spine fracture. 2. Cervical spondylosis. Electronically Signed   By: Jackquline Boxer M.D.   On: 08/26/2023 12:32   DG Chest Portable 1 View Result Date: 08/26/2023 CLINICAL DATA:  Fall, right hip fracture EXAM: PORTABLE CHEST 1 VIEW  COMPARISON:  08/19/2020 FINDINGS: The patient is rotated to the left on today's radiograph, reducing diagnostic sensitivity and specificity. Low lung volumes are present, causing crowding of the pulmonary vasculature. Age indeterminate deformity of the left sixth rib posterolaterally, correlate with any tenderness in this vicinity in determining acuity. Accounting for the low lung volumes, the lungs appear clear. Cardiac and mediastinal margins appear normal. IMPRESSION: 1. Age indeterminate fracture of the left sixth rib posterolaterally, correlate with any tenderness in this vicinity in determining acuity. 2. Low lung volumes. Electronically Signed   By: Ryan Salvage M.D.   On: 08/26/2023 12:26   DG Hip Unilat With Pelvis 2-3 Views Right Result Date: 08/26/2023 CLINICAL DATA:  Fall, right hip pain EXAM: DG HIP (WITH OR WITHOUT PELVIS) 2-3V RIGHT COMPARISON:  None Available. FINDINGS: Suboptimal positioning. High suspicion for fracture, either basicervical or intertrochanteric. Intra-articular femoral neck fracture favored but not absolutely certain. Consider CT of the right hip for differentiation. Spurring of both acetabula.  Bony demineralization. IMPRESSION: 1. Right proximal femoral fracture likely basicervical but possibly intertrochanteric. CT could help differentiate. 2. Bony demineralization. 3. Spurring of both acetabula. Electronically Signed   By: Ryan Salvage M.D.   On: 08/26/2023 12:24    Positive ROS: All other systems have been reviewed and were otherwise negative with the exception of those mentioned in the HPI and as above.  Physical Exam: General:  Alert, no acute distress Psychiatric: Patient is alert and oriented  to person place and president Cardiovascular:  No pedal edema Respiratory:  No wheezing, non-labored breathing GI:  Abdomen is soft and non-tender Skin:  No lesions in the area of chief complaint Neurologic:  Sensation intact distally Lymphatic:  No axillary  or cervical lymphadenopathy  Orthopedic Exam:  Right lower extremity Shortened and externally rotated Tender to palpation of the proximal femur pain with any motion of the leg No tenderness over the distal femur, knee, tibia, ankle foot or toes Compartments all soft Neurovascular intact to the foot able dorsiflex and plantarflex the foot and toes with intact dorsalis pedis pulse  Secondary survey No tenderness to palpation over other bony prominences in the lower extremities or bilateral upper extremities No pain with logroll or simulated axial loading of the left lower extremity All compartments soft No tenderness to palpation over the cervical or thoracic spine, no bony step-off Motor grossly intact throughout, no focal deficits Sensation grossly intact throughout, no focal deficits Good distal pulses and capillary refill on all extremities   X-rays:  X-rays and CT scan images reviewed myself full reads above.  There is displaced right basicervical femoral neck/intertrochanteric fracture.  The majority of the femoral neck is intact both anteriorly and posteriorly.  No subtrochanteric extension or significant comminution.  Agree with radiologist interpretation full reads above.   Assessment: basicervical intertrochanteric fracture of the right femur  Plan: Jenna Franklin is an 83 year old female who presents with a right basicervical intertrochanteric fracture.  I reviewed the imaging and the clinical findings with the patient and her son.  Discussed treatment options including operative and nonoperative management.  Given the extensive risks with nonoperative management and the potential for improvement of pain and function with surgery and under shared decision making model the patient and family agree with the plan to proceed with fixation of her right hip.  The patient will need a right hip intramedullary nail.  A long discussion took place with the patient describing what a intramedullary  nail is and what the procedure would entail. The xrays were reviewed with the patient and the implants were discussed. The ability to secure the implant utilizing screws/blade fixation was discussed. Surgical exposures were discussed with the patient.    The hospitalization and post-operative care and rehabilitation were also discussed. The use of perioperative antibiotics and DVT prophylaxis were discussed. The risk, benefits and alternatives to a surgical intervention were discussed at length with the patient. The patient was also advised of risks related to the medical comorbidities. A lengthy discussion took place to review the most common complications including but not limited to: deep vein thrombosis, pulmonary embolus, heart attack, stroke, infection, wound breakdown, dislocation, numbness, leg length in-equality, damage to nerves, intraoperative fracture, hardware cut out, hardware failure malunion/ nonunion, tendon,muscles, arteries or other blood vessels, death and other possible complications from anesthesia. The patient was told that we will take steps to minimize these risks by using sterile technique, antibiotics and DVT prophylaxis when appropriate and follow the patient postoperatively in the office setting to monitor progress. The possibility of recurrent pain, no improvement in pain and actual worsening of pain were also discussed with the patient.      The benefits of surgery were discussed with the patient including the potential for improving the patient's current clinical condition through operative intervention. Alternatives to surgical intervention including conservative management were also discussed in detail. All questions were answered to the satisfaction of the patient. The patient participated and agreed to the plan of care  as well as the use of the recommended implants for their surgery.    Plan for surgery today 08/26/2023 N.p.o.  for the operating room Hold anticoagulation   Consent was filled out with the patient and son and left on the chart.      Arthea Sheer MD  Beeper #:  [phone]  08/26/2023 3:38 PM

## 2023-08-26 NOTE — Op Note (Signed)
 Patient Name: Jenna Franklin  FMW:[id number]  Pre-Operative Diagnosis: Right hip basicervical Intertrochanteric fracture  Post-Operative Diagnosis: (same)  Procedure: Right  Hip Intramedullary nail   Components/Implants: Nail:TFNA 9x130 deg x158mm  Lag Blade :90mm Locking Screw:39mm  Date of Surgery: 08/26/2023  Surgeon: Arthea Sheer MD  Assistant: none  Anesthesiologist: Shellie  Anesthesia: General   EBL: 75cc  Complications: None   Brief history: The patient is a 83 year old female who presented to the [facility] emergency room after a fall and found to have a  right hip basicervical intertrochanteric fracture.  The patient was admitted by the medical team and optimized for surgery.  A thorough discussion was had with the patient and family about the risks and benefits of surgical intervention for their hip fracture as definitive treatment.  The patient and family opted to proceed with the operation.  All preoperative films were reviewed and an appropriate surgical plan was made prior to surgery.   Description of procedure: The patient was brought to the operating room where laterality was confirmed by all those present to be the right  side.  The patient was administered anesthesia on a stretcher prior to being moved supine on the operating room table. Patient was given an intravenous dose of antibiotics for surgical prophylaxis and TXA.  All bony prominences and extremities were well padded and the patient was securely attached to the table boots, a perineal post was placed and the patient had a safety strap placed.  Surgical site was prepped with alcohol and chlorhexidine. The surgical site over the hip was and draped in typical sterile fashion with multiple layers of adhesive and nonadhesive drapes.  The incision site was marked out with a sterile marker under fluoroscopic guidance.    A surgical timeout was then called with participation of all staff in  the room the patient was then a confirmed again and laterality confirmed. The hip fracture was reduced through indirect measures with traction and rotation of the leg on the fracture table. After an acceptable reduction was obtained on AP and lateral images the procedure started.  An incision was made just proximal to the greater trochanter through the skin subcutaneous tissues and an incision was made in the glut max fascia.  A guidewire was placed through the greater trochanter at the tip under x-ray guidance into the intertrochanteric region.  The position of this wire was assessed on AP and lateral fluoroscopic images to ensure position.  An opening reamer was used to create access at the tip of the greater trochanter under fluoroscopic guidance.   A size 9 x170 nail was advanced through the reamed hole in the proximal femur and seated within the femur to an appropriate depth under fluoroscopic guidance.  The aiming jig was used to mark an incision site for a lag blade to the femoral head which was then incised with a scalpel.  A wire was then advanced through the lateral cortex of the femur into the center of the femoral head on both AP and lateral fluoroscopic imaging stopping at the subchondral bone without penetration of the femoral head.  This wire was then used to measure for a lag screw and a size 90 lag blade was inserted into the femoral head through the lateral cortex of the femur and the nail under fluoroscopic guidance.  The setscrew was then tightened in the proximal part of the nail and engaged with the lag blade with a small amount of play left so as to  allow compression of the fracture.   The lag screw driver was removed and the aiming jig was switched for the distal interlocking screw.  A drill was used to drill a hole for the distal interlocking screw under fluoroscopic guidance and a size 34 screw was placed.  The intramedullary nail was assessed on AP and lateral fluoroscopic guidance  prior to removal of the aiming jig.  Final fluoroscopic x-rays were then taken after removal of the jig.  The nail was found to be in appropriate position on AP and lateral imaging with appropriate lengths of both the lag screw in the distal locking screw.  The fascia was closed with 0 Vicryl interrupted figure-of-eight sutures.  The subcutaneous tissues were closed with 2-0 Vicryl and the skin closed with 3-0 Monocryl and Dermabond.  Sterile dressings were applied to the incisions.   The patient was awoken from anesthesia transferred off of the operating room table onto a hospital bed.  The patient had a good pulse postoperatively in the foot . the patient was then transferred to the PACU in stable condition.

## 2023-08-26 NOTE — ED Notes (Signed)
 Pt Iv site bleeding. Bandage changed at this time.

## 2023-08-26 NOTE — ED Provider Notes (Addendum)
 [facility] Provider Note    Event Date/Time   First MD Initiated Contact with Patient 08/26/23 1136     (approximate)   History   Fall   HPI  Jenna Franklin is a 83 y.o. female past medical history significant for cirrhosis with esophageal varices, GERD, anxiety, who presents to the emergency department following a fall.  Patient unable to state what caused her to have the fall earlier today.  Called out and the neighbor heard her and called her daughter who had EMS come and get her from the floor.  Complaining of right hip pain.  Denies any anticoagulation.  Denies any head injury.  Denies nausea or vomiting.  Denies any chest pain or shortness of breath.  Denies dysuria.  Believes that she fell in the daytime.  Received IV fentanyl  with EMS     Physical Exam   Triage Vital Signs: ED Triage Vitals  Encounter Vitals Group     BP 08/26/23 1142 (!) 112/52     Girls Systolic BP Percentile --      Girls Diastolic BP Percentile --      Boys Systolic BP Percentile --      Boys Diastolic BP Percentile --      Pulse Rate 08/26/23 1142 100     Resp 08/26/23 1142 17     Temp 08/26/23 1142 98.1 F (36.7 C)     Temp Source 08/26/23 1142 Oral     SpO2 08/26/23 1142 100 %     Weight 08/26/23 1143 124 lb 9.6 oz (56.5 kg)     Height 08/26/23 1143 5' 3 (1.6 m)     Head Circumference --      Peak Flow --      Pain Score 08/26/23 1143 8     Pain Loc --      Pain Education --      Exclude from Growth Chart --     Most recent vital signs: Vitals:   08/26/23 1400 08/26/23 1430  BP: (!) 115/55 119/64  Pulse: 99 (!) 102  Resp: (!) 21 (!) 22  Temp:    SpO2: 100% 100%    Physical Exam Constitutional:      Appearance: She is well-developed.  HENT:     Head: Atraumatic.  Eyes:     Conjunctiva/sclera: Conjunctivae normal.  Cardiovascular:     Rate and Rhythm: Regular rhythm.  Pulmonary:     Effort: No respiratory distress.     Comments: Left  chest wall tenderness Chest:     Chest wall: Tenderness present.  Abdominal:     General: There is no distension.     Tenderness: There is no abdominal tenderness.     Comments: Old ecchymosis to the right flank with no underlying tenderness to palpation  Musculoskeletal:        General: Normal range of motion.     Cervical back: Normal range of motion.     Comments: Right hip tenderness palpation.  Right leg shortened.  No midline cervical, thoracic or lumbar tenderness to palpation.  +2 DP pulse.  Sensation intact to lower extremity  Skin:    General: Skin is warm.     Capillary Refill: Capillary refill takes less than 2 seconds.  Neurological:     General: No focal deficit present.     Mental Status: She is alert and oriented to person, place, and time. Mental status is at baseline.  Psychiatric:  Mood and Affect: Mood normal.     IMPRESSION / MDM / ASSESSMENT AND PLAN / ED COURSE  I reviewed the triage vital signs and the nursing notes.  Differential diagnosis including intracranial hemorrhage, hip fracture, electrolyte abnormality, ACS, dysrhythmia, cervical spine fracture, rhabdomyolysis  EKG  I, Clotilda Punter, the attending physician, personally viewed and interpreted this ECG.   Rate: Normal  Rhythm: Normal sinus  Axis: Normal  Intervals: Normal  ST&T Change: None  No tachycardic or bradycardic dysrhythmias while on cardiac telemetry.  RADIOLOGY I independently reviewed imaging, my interpretation of imaging: X-ray of the right hip with signs of fracture.  CT scan of the head with no signs of intracranial hemorrhage.  No signs of a cervical spine fracture.  Chest x-ray with questionable left rib fracture.  CT chest abdomen and pelvis and T and L reformats ordered  CT scan with no other acute fracture.  Did note the hip fracture and a malignancy concern with a mass in her colon.  Discussed this finding with her son.  LABS (all labs ordered are listed, but  only abnormal results are displayed) Labs interpreted as -    Labs Reviewed  CBC - Abnormal; Notable for the following components:      Result Value   RBC 3.61 (*)    Hemoglobin 10.6 (*)    HCT 32.7 (*)    RDW 16.0 (*)    Platelets 142 (*)    All other components within normal limits  COMPREHENSIVE METABOLIC PANEL WITH GFR - Abnormal; Notable for the following components:   CO2 20 (*)    Glucose, Bld 219 (*)    Calcium 8.2 (*)    Albumin 2.9 (*)    Total Bilirubin 1.6 (*)    All other components within normal limits  CK  AMMONIA  TROPONIN I (HIGH SENSITIVITY)     MDM    No leukocytosis.  Anemia with a hemoglobin of 10.6 which is down from her baseline of 14 but last time lab was checked multiple years ago.  Does have thrombocytopenia which appears chronic in nature and likely secondary to her known cirrhosis.  Found to have right hip fracture.  Did have questionable left rib fracture where she had underlying tenderness to palpation.  Will obtain CT scan chest abdomen pelvis and T and L reformats to further evaluate for other signs of trauma.  Discussed with Dr. Milta and with orthopedics who will come and evaluate with plans of likely going to the OR today.  Will consult hospitalist for admission.   PROCEDURES:  Critical Care performed: No  Procedures  Patient's presentation is most consistent with acute presentation with potential threat to life or bodily function.   MEDICATIONS ORDERED IN ED: Medications  fentaNYL  (SUBLIMAZE ) injection 50 mcg (50 mcg Intravenous Given 08/26/23 1525)  sodium chloride  0.9 % bolus 1,000 mL (0 mLs Intravenous Stopped 08/26/23 1402)    FINAL CLINICAL IMPRESSION(S) / ED DIAGNOSES   Final diagnoses:  Closed fracture of right hip, initial encounter (HCC)  Fall in home, initial encounter     Rx / DC Orders   ED Discharge Orders     None        Note:  This document was prepared using Dragon voice recognition software and may  include unintentional dictation errors.   Punter Clotilda, MD 08/26/23 1335    Punter Clotilda, MD 08/26/23 1449    Punter Clotilda, MD 08/26/23 1526

## 2023-08-27 ENCOUNTER — Telehealth: Payer: Self-pay

## 2023-08-27 DIAGNOSIS — S72001A Fracture of unspecified part of neck of right femur, initial encounter for closed fracture: Secondary | ICD-10-CM | POA: Diagnosis not present

## 2023-08-27 DIAGNOSIS — E43 Unspecified severe protein-calorie malnutrition: Secondary | ICD-10-CM | POA: Insufficient documentation

## 2023-08-27 LAB — BASIC METABOLIC PANEL WITH GFR
Anion gap: 11 (ref 5–15)
BUN: 9 mg/dL (ref 8–23)
CO2: 22 mmol/L (ref 22–32)
Calcium: 8.3 mg/dL — ABNORMAL LOW (ref 8.9–10.3)
Chloride: 104 mmol/L (ref 98–111)
Creatinine, Ser: 0.65 mg/dL (ref 0.44–1.00)
GFR, Estimated: >60 mL/min (ref >60)
Glucose, Bld: 190 mg/dL — ABNORMAL HIGH (ref 70–99)
Potassium: 3.6 mmol/L (ref 3.5–5.1)
Sodium: 137 mmol/L (ref 135–145)

## 2023-08-27 LAB — CBC
HCT: 28.1 % — ABNORMAL LOW (ref 36.0–46.0)
Hemoglobin: 9 g/dL — ABNORMAL LOW (ref 12.0–15.0)
MCH: 28.8 pg (ref 26.0–34.0)
MCHC: 32 g/dL (ref 30.0–36.0)
MCV: 89.8 fL (ref 80.0–100.0)
Platelets: 112 K/uL — ABNORMAL LOW (ref 150–400)
RBC: 3.13 MIL/uL — ABNORMAL LOW (ref 3.87–5.11)
RDW: 16.3 % — ABNORMAL HIGH (ref 11.5–15.5)
WBC: 5.7 K/uL (ref 4.0–10.5)
nRBC: 0 % (ref 0.0–0.2)

## 2023-08-27 MED ORDER — CHLORHEXIDINE GLUCONATE CLOTH 2 % EX PADS
6.0000 | MEDICATED_PAD | Freq: Every day | CUTANEOUS | Status: DC
  Administered 2023-08-28 – 2023-09-06 (×10): 6 via TOPICAL

## 2023-08-27 MED ORDER — ADULT MULTIVITAMIN W/MINERALS CH
1.0000 | ORAL_TABLET | Freq: Every day | ORAL | Status: DC
  Filled 2023-08-27: qty 1

## 2023-08-27 MED ORDER — ADULT MULTIVITAMIN W/MINERALS CH
1.0000 | ORAL_TABLET | Freq: Every day | ORAL | Status: DC
  Administered 2023-08-27 – 2023-09-06 (×11): 1 via ORAL
  Filled 2023-08-27 (×10): qty 1

## 2023-08-27 MED ORDER — LACTATED RINGERS IV SOLN: INTRAVENOUS | Status: AC

## 2023-08-27 MED ORDER — ADULT MULTIVITAMIN W/MINERALS CH: 1.0000 | ORAL_TABLET | Freq: Every day | ORAL | Status: DC

## 2023-08-27 MED ORDER — ENSURE PLUS HIGH PROTEIN PO LIQD
237.0000 mL | Freq: Two times a day (BID) | ORAL | Status: DC
  Administered 2023-08-27 – 2023-09-06 (×18): 237 mL via ORAL

## 2023-08-27 NOTE — NC FL2 (Signed)
 [place]  MEDICAID [id number] LEVEL OF CARE FORM     IDENTIFICATION  Patient Name: Jenna Franklin Birthdate: Oct 19, 1940 Sex: female Admission Date (Current Location): 08/26/2023  [facility] and [place] Number:  Chiropodist and Address:  [facility], [address] [place], [place] [address]      Provider Number: [id number]  Attending Physician Name and Address:  Marsa Edelman, DO  Relative Name and Phone Number:  [name] Cones [phone]    Current Level of Care: Hospital Recommended Level of Care: Skilled Nursing Facility Prior Approval Number:    Date Approved/Denied:   PASRR Number:    Discharge Plan: SNF    Current Diagnoses: Patient Active Problem List   Diagnosis Date Noted   Protein-calorie malnutrition, severe 08/27/2023   Right femoral fracture (HCC) 08/26/2023   Colonic mass 08/26/2023   Weight loss 08/26/2023   TMJ arthralgia 05/26/2019   Lumbar spinal stenosis 07/27/2018   Thoracic compression fracture, sequela 04/01/2018   Esophageal varices in cirrhosis (HCC)    Cirrhosis of liver with ascites (HCC) 05/29/2015   Post-menopausal atrophic vaginitis 05/29/2015   Anxiety 03/25/2015   Acid reflux 03/25/2015   Pre-diabetes 09/20/2014   Bursitis, trochanteric 12/11/2011    Orientation RESPIRATION BLADDER Height & Weight     Self  Normal Incontinent Weight: 124 lb 9.6 oz (56.5 kg) Height:  5' 3 (160 cm)  BEHAVIORAL SYMPTOMS/MOOD NEUROLOGICAL BOWEL NUTRITION STATUS      Incontinent Diet  AMBULATORY STATUS COMMUNICATION OF NEEDS Skin   Extensive Assist Verbally Skin abrasions                       Personal Care Assistance Level of Assistance  Bathing, Feeding, Dressing Bathing Assistance: Limited assistance Feeding assistance: Limited assistance Dressing Assistance: Limited assistance     Functional Limitations Info  Sight, Hearing, Speech          SPECIAL CARE FACTORS FREQUENCY  PT (By licensed PT),  OT (By licensed OT)     PT Frequency: 5x OT Frequency: 5x            Contractures      Additional Factors Info  Code Status, Allergies Code Status Info: FULL Allergies Info: Codeine; Sulfa Antibiotics; Prednisone , Baclofen            Current Medications (08/27/2023):  This is the current hospital active medication list Current Facility-Administered Medications  Medication Dose Route Frequency Provider Last Rate Last Admin   acetaminophen  (TYLENOL ) tablet 325-650 mg  325-650 mg Oral Q6H PRN Aberman, Zachary, MD       docusate sodium  (COLACE) capsule 100 mg  100 mg Oral BID Aberman, Zachary, MD   100 mg at 08/27/23 0813   enoxaparin  (LOVENOX ) injection 40 mg  40 mg Subcutaneous Q24H Aberman, Zachary, MD   40 mg at 08/27/23 0813   feeding supplement (ENSURE PLUS HIGH PROTEIN) liquid 237 mL  237 mL Oral BID BM Alexander, Natalie, DO   237 mL at 08/27/23 1332   fentaNYL  (SUBLIMAZE ) injection 50 mcg  50 mcg Intravenous Q1H PRN Suzanne Kirsch, MD   50 mcg at 08/26/23 1525   HYDROcodone -acetaminophen  (NORCO) 7.5-325 MG per tablet 1-2 tablet  1-2 tablet Oral Q4H PRN Aberman, Zachary, MD   2 tablet at 08/27/23 1331   HYDROcodone -acetaminophen  (NORCO/VICODIN) 5-325 MG per tablet 1-2 tablet  1-2 tablet Oral Q4H PRN Aberman, Zachary, MD       ketorolac  (TORADOL ) 15 MG/ML injection 7.5 mg  7.5  mg Intravenous Q6H Aberman, Zachary, MD   7.5 mg at 08/27/23 0023   LORazepam  (ATIVAN ) injection 0.5 mg  0.5 mg Intravenous Q6H PRN Cox, Amy N, DO       menthol -cetylpyridinium (CEPACOL) lozenge 3 mg  1 lozenge Oral PRN Aberman, Zachary, MD       Or   phenol (CHLORASEPTIC) mouth spray 1 spray  1 spray Mouth/Throat PRN Aberman, Zachary, MD       metoCLOPramide  (REGLAN ) tablet 5-10 mg  5-10 mg Oral Q8H PRN Aberman, Zachary, MD       Or   metoCLOPramide  (REGLAN ) injection 5-10 mg  5-10 mg Intravenous Q8H PRN Aberman, Zachary, MD       morphine  (PF) 2 MG/ML injection 0.5-1 mg  0.5-1 mg Intravenous Q2H PRN  Aberman, Zachary, MD   1 mg at 08/26/23 2302   multivitamin with minerals tablet 1 tablet  1 tablet Oral Daily Alexander, Natalie, DO   1 tablet at 08/27/23 1014   ondansetron  (ZOFRAN ) tablet 4 mg  4 mg Oral Q6H PRN Aberman, Zachary, MD       Or   ondansetron  (ZOFRAN ) injection 4 mg  4 mg Intravenous Q6H PRN Aberman, Zachary, MD         Discharge Medications: Please see discharge summary for a list of discharge medications.  Relevant Imaging Results:  Relevant Lab Results:   Additional Information [id number]  [place] L Kayelee Herbig, LCSW

## 2023-08-27 NOTE — Progress Notes (Signed)
 PROGRESS NOTE    Jenna Franklin   FMW:[id number] DOB: 1940-07-15  DOA: 08/26/2023 Date of Service: 08/27/23 which is hospital day 1  PCP: Justus Leita DEL, MD    Hospital course / significant events:   HPI: Jenna Franklin is an 83 year old female with history of liver cirrhosis with splenomegaly and esophageal varices, anxiety, GERD, who presents emergency department for chief concerns of fall with resulting right hip pain.  07/10: admitted to hospitalist service. Underwent R hip fracture repair 07/11: PT/OT recs for SNF.  Discussion today with family regarding colon mass and options re further w/u      Consultants:  Orthopedics   Procedures/Surgeries: Right Hip Intramedullary nail repair 08/26/2023 w/ Dr Lorelle      ASSESSMENT & PLAN:   Right femoral fracture Secondary to fall S/p Right Hip Intramedullary nail repair 08/26/2023 w/ Dr Lorelle Orthopedic surgery following Pain control WBAT  Fall precaution PT, OT Follow up with [facility] in 2 weeks Lovenox  40 mg SQ daily x 14 days at discharge  Weight loss Colonic mass with infiltration into omentum, likely malignancy Pulmonary nodule, malignancy cannot be excluded Patient was not able to quantify how much weight she has lost, she only states that it is a lot Given CT read of colonic mass with omental implants and small pulmonary nodules, high suspicion for malignancy and metastatic disease cannot be excluded at this time Admitting hospitalist discussed CT finding with son at bedside Chyrl, with patient's permission - He states he will talk to his mother further after hip fracture to determine her goals of care Outpatient follow up, consider colonoscopy - Dr Jinny GI spoke today with patient's daughter, holding off on colonoscopy for now and considering overall frailty and other comorbidities may not be a good candidate for aggressive workup / treatment but would recommend further discussion with oncology  team Oncology consult, nonemergent, will discuss with them tomorrow   Cirrhosis complicated by Esophageal varices Appears compensated at this time and not in acute exacerbation Continue outpatient follow-up with GI specialist as appropriate  Anxiety PDMP reviewed. currently no active prescription outpatient Lorazepam  0.5 mg IV every 6 hours as needed for anxiety, seizure, 2 doses ordered    No concerns based on BMI: Body mass index is 22.07 kg/m.SABRA Significantly low or high BMI is associated with higher medical risk.  Underweight - under 18  overweight - 25 to 29 obese - 30 or more Class 1 obesity: BMI of 30.0 to 34 Class 2 obesity: BMI of 35.0 to 39 Class 3 obesity: BMI of 40.0 to 49 Super Morbid Obesity: BMI 50-59 Super-super Morbid Obesity: BMI 60+ Healthy nutrition and physical activity advised as adjunct to other disease management and risk reduction treatments    DVT prophylaxis: Lovenox  IV fluids: no continuous IV fluids  Nutrition: Regular diet Central lines / other devices: None  Code Status: Full code ACP documentation reviewed:  none on file in [facility]  TOC needs: SNF placement for rehab Medical barriers to dispo: None at this time             Subjective / Brief ROS:  Patient reports feeling okay.  She is somewhat confused Denies CP/SOB.  Pain controlled.  Denies new weakness.  Tolerating diet .  Reports no concerns w/ urination/defecation.   Family Communication: Long discussion this afternoon with patient's son and patient's daughter regarding finding of colon mass, see assessment/plan    Objective Findings:  Vitals:   08/26/23 2252 08/27/23 0258 08/27/23  0708 08/27/23 1457  BP: 113/60 111/61 113/63 118/77  Pulse: (!) 102 (!) 102 100 99  Resp: 17 17 17 18   Temp: 98.4 F (36.9 C) 98.4 F (36.9 C) 98.4 F (36.9 C) 98.3 F (36.8 C)  TempSrc: Oral Oral Oral Oral  SpO2: 98% 97% 97% 98%  Weight:      Height:        Intake/Output  Summary (Last 24 hours) at 08/27/2023 1830 Last data filed at 08/27/2023 1059 Gross per 24 hour  Intake 340 ml  Output --  Net 340 ml   Filed Weights   08/26/23 1143  Weight: 56.5 kg    Examination:  Physical Exam Constitutional:      General: [name]. Cardiovascular:     Rate and Rhythm: Normal rate and regular rhythm.  Pulmonary:     Effort: Pulmonary effort is normal.     Breath sounds: Normal breath sounds.  Neurological:     General: No focal deficit present.     Mental Status: She is alert. She is disoriented.  Psychiatric:        Mood and Affect: Mood normal.        Behavior: Behavior normal.          Scheduled Medications:   docusate sodium   100 mg Oral BID   enoxaparin  (LOVENOX ) injection  40 mg Subcutaneous Q24H   feeding supplement  237 mL Oral BID BM   multivitamin with minerals  1 tablet Oral Daily    Continuous Infusions:    PRN Medications:  acetaminophen , fentaNYL  (SUBLIMAZE ) injection, HYDROcodone -acetaminophen , HYDROcodone -acetaminophen , LORazepam , menthol -cetylpyridinium **OR** phenol, metoCLOPramide  **OR** metoCLOPramide  (REGLAN ) injection, morphine  injection, ondansetron  **OR** ondansetron  (ZOFRAN ) IV  Antimicrobials from admission:  Anti-infectives (From admission, onward)    Start     Dose/Rate Route Frequency Ordered Stop   08/26/23 2300  ceFAZolin  (ANCEF ) IVPB 2g/100 mL premix        2 g 200 mL/hr over 30 Minutes Intravenous Every 6 hours 08/26/23 2015 08/27/23 1059   08/26/23 1545  ceFAZolin  (ANCEF ) IVPB 2g/100 mL premix        2 g 200 mL/hr over 30 Minutes Intravenous  Once 08/26/23 1544 08/26/23 1628           Data Reviewed:  I have personally reviewed the following...  CBC: Recent Labs  Lab 08/26/23 1146 08/27/23 0456  WBC 6.5 5.7  HGB 10.6* 9.0*  HCT 32.7* 28.1*  MCV 90.6 89.8  PLT 142* 112*   Basic Metabolic Panel: Recent Labs  Lab 08/26/23 1146 08/27/23 0456  NA 136 137  K 3.7 3.6   CL 105 104  CO2 20* 22  GLUCOSE 219* 190*  BUN 8 9  CREATININE 0.64 0.65  CALCIUM 8.2* 8.3*   GFR: Estimated Creatinine Clearance: 44.1 mL/min (by C-G formula based on SCr of 0.65 mg/dL). Liver Function Tests: Recent Labs  Lab 08/26/23 1146  AST 40  ALT 17  ALKPHOS 59  BILITOT 1.6*  PROT 6.5  ALBUMIN 2.9*   No results for input(s): LIPASE, AMYLASE in the last 168 hours. Recent Labs  Lab 08/26/23 1242  AMMONIA 23   Coagulation Profile: No results for input(s): INR, PROTIME in the last 168 hours. Cardiac Enzymes: Recent Labs  Lab 08/26/23 1146  CKTOTAL 45   BNP (last 3 results) No results for input(s): PROBNP in the last 8760 hours. HbA1C: No results for input(s): HGBA1C in the last 72 hours. CBG: No results for input(s): GLUCAP  in the last 168 hours. Lipid Profile: No results for input(s): CHOL, HDL, LDLCALC, TRIG, CHOLHDL, LDLDIRECT in the last 72 hours. Thyroid  Function Tests: No results for input(s): TSH, T4TOTAL, FREET4, T3FREE, THYROIDAB in the last 72 hours. Anemia Panel: No results for input(s): VITAMINB12, FOLATE, FERRITIN, TIBC, IRON, RETICCTPCT in the last 72 hours. Most Recent Urinalysis On File:     Component Value Date/Time   COLORURINE STRAW (A) 04/21/2019 1455   APPEARANCEUR CLEAR 04/21/2019 1455   LABSPEC 1.010 04/21/2019 1455   PHURINE 6.0 04/21/2019 1455   GLUCOSEU NEGATIVE 04/21/2019 1455   HGBUR NEGATIVE 04/21/2019 1455   BILIRUBINUR neg 09/06/2019 0833   KETONESUR NEGATIVE 04/21/2019 1455   PROTEINUR Negative 09/06/2019 0833   PROTEINUR NEGATIVE 04/21/2019 1455   UROBILINOGEN 0.2 09/06/2019 0833   NITRITE neg 09/06/2019 0833   NITRITE NEGATIVE 04/21/2019 1455   LEUKOCYTESUR Negative 09/06/2019 0833   LEUKOCYTESUR NEGATIVE 04/21/2019 1455   Sepsis Labs: @LABRCNTIP (procalcitonin:4,lacticidven:4) Microbiology: No results found for this or any previous visit (from the past 240 hours).     Radiology Studies last 3 days: DG HIP UNILAT WITH PELVIS 2-3 VIEWS RIGHT Result Date: 08/26/2023 CLINICAL DATA:  ORIF of the right hip. EXAM: DG HIP (WITH OR WITHOUT PELVIS) 2-3V RIGHT COMPARISON:  Right hip radiograph dated 08/25/2025. FINDINGS: Six intraoperative fluoroscopic spot images provided. The total fluoroscopic time is 1 minute, 22 seconds with a cumulative air Karma of 17.06 mGy. Status post ORIF of the right femur. IMPRESSION: Intraoperative fluoroscopic guidance for ORIF of the right femur. Electronically Signed   By: Vanetta Chou M.D.   On: 08/26/2023 17:20   DG C-Arm 1-60 Min-No Report Result Date: 08/26/2023 Fluoroscopy was utilized by the requesting physician.  No radiographic interpretation.   CT CHEST ABDOMEN PELVIS WO CONTRAST Result Date: 08/26/2023 CLINICAL DATA:  Trauma. EXAM: CT CHEST, ABDOMEN AND PELVIS WITHOUT CONTRAST TECHNIQUE: Multidetector CT imaging of the chest, abdomen and pelvis was performed following the standard protocol without IV contrast. RADIATION DOSE REDUCTION: This exam was performed according to the departmental dose-optimization program which includes automated exposure control, adjustment of the mA and/or kV according to patient size and/or use of iterative reconstruction technique. COMPARISON:  None Available. FINDINGS: Evaluation of this exam is limited in the absence of intravenous contrast. CT CHEST FINDINGS Cardiovascular: There is no cardiomegaly or pericardial effusion. The thoracic aorta and the central pulmonary arteries are grossly unremarkable. There is a left-sided aortic arch with aberrant right subclavian artery anatomy. Mediastinum/Nodes: No hilar or mediastinal adenopathy. Small hiatal hernia. The esophagus is grossly unremarkable. No mediastinal fluid collection. Lungs/Pleura: Minimal bibasilar subpleural atelectasis. There is a 7 mm right middle lobe and a 4 mm right middle lobe nodules. A 9 mm nodular density at the right lung base  along the diaphragm. No consolidative changes. There is no pleural effusion pneumothorax. The central airways are patent. Musculoskeletal: Osteopenia. Old T12 compression fracture. No acute osseous pathology. CT ABDOMEN PELVIS FINDINGS No intra-abdominal free air.  Small ascites. Hepatobiliary: Cirrhosis.  No biliary dilatation.  Cholecystectomy. Pancreas: Unremarkable. No pancreatic ductal dilatation or surrounding inflammatory changes. Spleen: Splenomegaly measuring 15 cm in length. Adrenals/Urinary Tract: The adrenal glands unremarkable. Small right renal upper pole cyst. There is no hydronephrosis or nephrolithiasis on either side. The visualized ureters and urinary bladder marked. Stomach/Bowel: There is sigmoid diverticulosis. Diffuse thickened appearance of the colon may be related to hepatic colopathy and size. Colitis is not excluded. There is however an area of irregularity and thickening of  the colonic wall with nodular protrusion involving the proximal transverse colon (axial 70/4 and coronal 28/7) concerning for malignancy. Further evaluation with colonoscopy recommended. There is no bowel obstruction. The appendix is not well visualized due to ascites. Vascular/Lymphatic: Mild aortoiliac atherosclerotic disease. The IVC is unremarkable. No portal venous gas. No adenopathy. Several small nodules adjacent to the proximal transverse colon as well as small omental nodules in the right upper abdomen (64/4) metastasis. Reproductive: Hysterectomy. Other: Soft tissue nodules in the anterior abdomen and involving the right rectus sheath measure up to 3.8 x 2.0 cm consistent with metastatic implants. Musculoskeletal: Osteopenia with degenerative changes of the spine. Old L1 compression fracture with anterior wedging. There is mildly angulated fracture of the right femoral neck involving the basicervical and trochanteric region. IMPRESSION: 1. Mildly angulated fracture of the right femoral neck. 2. No  acute/traumatic intrathoracic pathology. 3. Cirrhosis with splenomegaly and small ascites. 4. Sigmoid diverticulosis. 5. Findings concerning for colonic mass/malignancy of the proximal transverse colon with omental implants. Further evaluation with colonoscopy is recommended. 6. Small pulmonary nodules, metastatic disease is not excluded. 7.  Aortic Atherosclerosis (ICD10-I70.0). Electronically Signed   By: Vanetta Chou M.D.   On: 08/26/2023 14:58   CT T-SPINE NO CHARGE Result Date: 08/26/2023 CLINICAL DATA:  Trauma. EXAM: CT THORACIC AND LUMBAR SPINE WITHOUT CONTRAST TECHNIQUE: Multidetector CT imaging of the thoracic and lumbar spine was performed without contrast. Multiplanar CT image reconstructions were also generated. RADIATION DOSE REDUCTION: This exam was performed according to the departmental dose-optimization program which includes automated exposure control, adjustment of the mA and/or kV according to patient size and/or use of iterative reconstruction technique. COMPARISON:  Lumbar spine CT dated 08/19/2020. FINDINGS: CT THORACIC SPINE FINDINGS Alignment: No acute subluxation. Vertebrae: No acute fracture. The bones are osteopenic. Old T12 compression fracture with approximately 50% loss of vertebral body height anteriorly and anterior wedging. This is relatively similar to the lumbar spine CT of 08/19/2020. Paraspinal and other soft tissues: Negative. Disc levels: No acute findings with degenerative changes. CT LUMBAR SPINE FINDINGS Segmentation: 5 lumbar type vertebrae. Alignment: No acute subluxation. Vertebrae: Old appearing L1 compression fracture with approximately 50% loss of vertebral body height and anterior wedging, new since the prior CT. No definite acute fracture. The bones are osteopenic. Paraspinal and other soft tissues: Negative. Disc levels: Degenerative changes. IMPRESSION: 1. No acute/traumatic thoracic or lumbar spine pathology. 2. Old T12 and L1 compression fractures.  Electronically Signed   By: Vanetta Chou M.D.   On: 08/26/2023 14:47   CT L-SPINE NO CHARGE Result Date: 08/26/2023 CLINICAL DATA:  Trauma. EXAM: CT THORACIC AND LUMBAR SPINE WITHOUT CONTRAST TECHNIQUE: Multidetector CT imaging of the thoracic and lumbar spine was performed without contrast. Multiplanar CT image reconstructions were also generated. RADIATION DOSE REDUCTION: This exam was performed according to the departmental dose-optimization program which includes automated exposure control, adjustment of the mA and/or kV according to patient size and/or use of iterative reconstruction technique. COMPARISON:  Lumbar spine CT dated 08/19/2020. FINDINGS: CT THORACIC SPINE FINDINGS Alignment: No acute subluxation. Vertebrae: No acute fracture. The bones are osteopenic. Old T12 compression fracture with approximately 50% loss of vertebral body height anteriorly and anterior wedging. This is relatively similar to the lumbar spine CT of 08/19/2020. Paraspinal and other soft tissues: Negative. Disc levels: No acute findings with degenerative changes. CT LUMBAR SPINE FINDINGS Segmentation: 5 lumbar type vertebrae. Alignment: No acute subluxation. Vertebrae: Old appearing L1 compression fracture with approximately 50% loss of vertebral body height  and anterior wedging, new since the prior CT. No definite acute fracture. The bones are osteopenic. Paraspinal and other soft tissues: Negative. Disc levels: Degenerative changes. IMPRESSION: 1. No acute/traumatic thoracic or lumbar spine pathology. 2. Old T12 and L1 compression fractures. Electronically Signed   By: Vanetta Chou M.D.   On: 08/26/2023 14:47   DG Elbow Complete Right Result Date: 08/26/2023 CLINICAL DATA:  Fall. EXAM: RIGHT ELBOW - COMPLETE 3+ VIEW COMPARISON:  08/19/2020. FINDINGS: There is no evidence of fracture, dislocation, or joint effusion. Mild degenerative spurring of the coronoid process of the ulna. Soft tissues are unremarkable.  IMPRESSION: No acute fracture or dislocation of the right elbow. Electronically Signed   By: Harrietta Sherry M.D.   On: 08/26/2023 14:08   DG FEMUR, MIN 2 VIEWS RIGHT Result Date: 08/26/2023 CLINICAL DATA:  Right hip pain after fall. EXAM: RIGHT FEMUR 2 VIEWS COMPARISON:  Same-day pelvic radiographs dated 08/26/2023 at 12:03 p.m. FINDINGS: Diffuse osseous demineralization. Redemonstrated probable basicervical fracture of the right proximal femur. Intertrochanteric extension cannot be excluded. No additional fracture identified. No dislocation. Peripheral vascular calcifications. IMPRESSION: Redemonstrated probable basicervical fracture of the right proximal femur. Intertrochanteric extension cannot be excluded. No additional fracture identified. Electronically Signed   By: Harrietta Sherry M.D.   On: 08/26/2023 14:06   CT Head Wo Contrast Result Date: 08/26/2023 CLINICAL DATA:  Altered mental status.  Fall. EXAM: CT HEAD WITHOUT CONTRAST CT CERVICAL SPINE WITHOUT CONTRAST TECHNIQUE: Multidetector CT imaging of the head and cervical spine was performed following the standard protocol without intravenous contrast. Multiplanar CT image reconstructions of the cervical spine were also generated. RADIATION DOSE REDUCTION: This exam was performed according to the departmental dose-optimization program which includes automated exposure control, adjustment of the mA and/or kV according to patient size and/or use of iterative reconstruction technique. COMPARISON:  None Available. FINDINGS: CT HEAD FINDINGS Brain: No acute intracranial hemorrhage. No focal mass lesion. No CT evidence of acute infarction. No midline shift or mass effect. No hydrocephalus. Basilar cisterns are patent. There are periventricular and subcortical white matter hypodensities. Generalized cortical atrophy. Vascular: No hyperdense vessel or unexpected calcification. Skull: Normal. Negative for fracture or focal lesion. Sinuses/Orbits: Paranasal  sinuses and mastoid air cells are clear. Orbits are clear. Other: None. CT CERVICAL SPINE FINDINGS Alignment: Normal alignment of the cervical vertebral bodies. Skull base and vertebrae: Normal craniocervical junction. No loss of vertebral body height or disc height. Normal facet articulation. No evidence of fracture. Soft tissues and spinal canal: No prevertebral soft tissue swelling. No perispinal or epidural hematoma. Disc levels: Bulky RIGHT facet hypertrophy from C3 to C5. Anterior endplate spurring R4-R2. No acute findings Upper chest: Clear Other: None IMPRESSION: HEAD CT: 1. No acute intracranial findings. 2. Atrophy and white matter microvascular disease. CERVICAL SPINE CT: 1. No cervical spine fracture. 2. Cervical spondylosis. Electronically Signed   By: Jackquline Boxer M.D.   On: 08/26/2023 12:32   CT Cervical Spine Wo Contrast Result Date: 08/26/2023 CLINICAL DATA:  Altered mental status.  Fall. EXAM: CT HEAD WITHOUT CONTRAST CT CERVICAL SPINE WITHOUT CONTRAST TECHNIQUE: Multidetector CT imaging of the head and cervical spine was performed following the standard protocol without intravenous contrast. Multiplanar CT image reconstructions of the cervical spine were also generated. RADIATION DOSE REDUCTION: This exam was performed according to the departmental dose-optimization program which includes automated exposure control, adjustment of the mA and/or kV according to patient size and/or use of iterative reconstruction technique. COMPARISON:  None Available. FINDINGS: CT  HEAD FINDINGS Brain: No acute intracranial hemorrhage. No focal mass lesion. No CT evidence of acute infarction. No midline shift or mass effect. No hydrocephalus. Basilar cisterns are patent. There are periventricular and subcortical white matter hypodensities. Generalized cortical atrophy. Vascular: No hyperdense vessel or unexpected calcification. Skull: Normal. Negative for fracture or focal lesion. Sinuses/Orbits: Paranasal  sinuses and mastoid air cells are clear. Orbits are clear. Other: None. CT CERVICAL SPINE FINDINGS Alignment: Normal alignment of the cervical vertebral bodies. Skull base and vertebrae: Normal craniocervical junction. No loss of vertebral body height or disc height. Normal facet articulation. No evidence of fracture. Soft tissues and spinal canal: No prevertebral soft tissue swelling. No perispinal or epidural hematoma. Disc levels: Bulky RIGHT facet hypertrophy from C3 to C5. Anterior endplate spurring R4-R2. No acute findings Upper chest: Clear Other: None IMPRESSION: HEAD CT: 1. No acute intracranial findings. 2. Atrophy and white matter microvascular disease. CERVICAL SPINE CT: 1. No cervical spine fracture. 2. Cervical spondylosis. Electronically Signed   By: Jackquline Boxer M.D.   On: 08/26/2023 12:32   DG Chest Portable 1 View Result Date: 08/26/2023 CLINICAL DATA:  Fall, right hip fracture EXAM: PORTABLE CHEST 1 VIEW COMPARISON:  08/19/2020 FINDINGS: The patient is rotated to the left on today's radiograph, reducing diagnostic sensitivity and specificity. Low lung volumes are present, causing crowding of the pulmonary vasculature. Age indeterminate deformity of the left sixth rib posterolaterally, correlate with any tenderness in this vicinity in determining acuity. Accounting for the low lung volumes, the lungs appear clear. Cardiac and mediastinal margins appear normal. IMPRESSION: 1. Age indeterminate fracture of the left sixth rib posterolaterally, correlate with any tenderness in this vicinity in determining acuity. 2. Low lung volumes. Electronically Signed   By: Ryan Salvage M.D.   On: 08/26/2023 12:26   DG Hip Unilat With Pelvis 2-3 Views Right Result Date: 08/26/2023 CLINICAL DATA:  Fall, right hip pain EXAM: DG HIP (WITH OR WITHOUT PELVIS) 2-3V RIGHT COMPARISON:  None Available. FINDINGS: Suboptimal positioning. High suspicion for fracture, either basicervical or intertrochanteric.  Intra-articular femoral neck fracture favored but not absolutely certain. Consider CT of the right hip for differentiation. Spurring of both acetabula.  Bony demineralization. IMPRESSION: 1. Right proximal femoral fracture likely basicervical but possibly intertrochanteric. CT could help differentiate. 2. Bony demineralization. 3. Spurring of both acetabula. Electronically Signed   By: Ryan Salvage M.D.   On: 08/26/2023 12:24       Time spent: 50 min     Laneta Blunt, DO [facility] 08/27/2023, 6:30 PM    Dictation software may have been used to generate the above note. Typos may occur and escape review in typed/dictated notes. Please contact Dr Blunt directly for clarity if needed.  Staff may message me via secure chat in Epic  but this may not receive an immediate response,  please page me for urgent matters!  If 7PM-7AM, please contact night coverage [email or web address]

## 2023-08-27 NOTE — Progress Notes (Signed)
 Dayshift RN notified no documentation of void during shift. Bladder scan completed MD notified and fluids ordered and in and out cath orders if 3 in and out cath completed place foley. MD order in and out cath if after voiding or attempted void residual over 250. LR started pt refused to have in and out cath completed and requested more time. Bladder scan completed again at 2215 and was 389. Pt reported void some. Pt voiding scant amount. With tech assist pt got up to bsc and voided 200. Bladder scan completed 181. Not meeting requirement for in and out cath.

## 2023-08-27 NOTE — Progress Notes (Signed)
 Initial Nutrition Assessment  DOCUMENTATION CODES:   Severe malnutrition in context of chronic illness  INTERVENTION:   -Continue regular diet -MVI with minerals daily -Magic cup BID with meals, each supplement provides 290 kcal and 9 grams of protein  -Ensure Plus High Protein po BID, each supplement provides 350 kcal and 20 grams of protein  -Feeding assistance with meals (may need assistance with tray set-up and opening packages)   NUTRITION DIAGNOSIS:   Severe Malnutrition related to chronic illness (cirrhosis) as evidenced by percent weight loss, moderate fat depletion, severe fat depletion, moderate muscle depletion, severe muscle depletion.  GOAL:   Patient will meet greater than or equal to 90% of their needs  MONITOR:   PO intake, Supplement acceptance  REASON FOR ASSESSMENT:   Consult Assessment of nutrition requirement/status  ASSESSMENT:   Pt with history of liver cirrhosis with splenomegaly and esophageal varices, anxiety, GERD, who presents for chief concerns of fall with resulting right hip pain.  Pt admitted with rt femoral fracture s/p fall.   7/10- s/p Right Hip Intramedullary nail   Reviewed I/O's: +665 ml x 24 hours   UOP: 0 ml x 24 hours  Spoke with pt at bedside, who was pleasant and in good spirits today. Pt reports feeling poorly and states she has been through a lot over the past few days. She recalls having surgery yesterday. Pt able to give a history, but noted periods of intermittent confusion. Case discussed with RN, who shares that this is consistent with her assessment.   Per pt, she has experienced a general decline in health over the past few months. She reports weakness and increased falls. She complains of hand tremors, which vary in intensity throughout the day, as well as a red rash on her hands, which she reports is not painful and does not know how this occurred (states that redness came about the same time as the tremors).   Pt  reports that appetite is variable (sometimes I eat, sometimes I don't). She was unable to provide a diet recall, but expressed anticipation for breakfast this morning.   Pt endorses wt loss, but unsure of UBW or how much she has lost. Reviewed wt hx; pt has experienced a 12.3% wt loss over the past 2 months, which is significant for time frame.   Per MD notes, CT revealed colonic mass with omental implants and small pulmonary nodules, high suspicion for malignancy and metastatic disease cannot be excluded at this time. Pt and son to discuss GOC post-operatively.   Discussed importance of good meal and supplement intake to promote healing. Pt amenable to supplements.   Medications reviewed and include colace.   Labs reviewed.    NUTRITION - FOCUSED PHYSICAL EXAM:  Flowsheet Row Most Recent Value  Orbital Region Severe depletion  Upper Arm Region Severe depletion  Thoracic and Lumbar Region Mild depletion  Buccal Region Mild depletion  Temple Region Moderate depletion  Clavicle Bone Region Severe depletion  Clavicle and Acromion Bone Region Severe depletion  Scapular Bone Region Severe depletion  Dorsal Hand Severe depletion  Patellar Region Moderate depletion  Anterior Thigh Region Moderate depletion  Posterior Calf Region Moderate depletion  Edema (RD Assessment) Mild  Hair Reviewed  Eyes Reviewed  Mouth Reviewed  Skin Reviewed  Nails Reviewed    Diet Order:   Diet Order             Diet regular Room service appropriate? Yes; Fluid consistency: Thin  Diet effective now  EDUCATION NEEDS:   Education needs have been addressed  Skin:  Skin Assessment: Skin Integrity Issues: Skin Integrity Issues:: Incisions Incisions: closed rt hip  Last BM:  08/26/23  Height:   Ht Readings from Last 1 Encounters:  08/26/23 5' 3 (1.6 m)    Weight:   Wt Readings from Last 1 Encounters:  08/26/23 56.5 kg    Ideal Body Weight:  52.3 kg  BMI:  Body  mass index is 22.07 kg/m.  Estimated Nutritional Needs:   Kcal:  1700-1900  Protein:  90-105 grams  Fluid:  1.7-1.9 L    Margery ORN, RD, LDN, CDCES Registered Dietitian III Certified Diabetes Care and Education Specialist If unable to reach this RD, please use RD Inpatient group chat on secure chat between hours of 8am-4 pm daily

## 2023-08-27 NOTE — Plan of Care (Signed)

## 2023-08-27 NOTE — Telephone Encounter (Signed)
 Daughter [name] called concerned about her mother's CT results, pt is admitted at [facility]... Pt had been seeing you for many years and she wanted your advice... I did explain to the patient that you were the on call hospitalist and would likely be called to consult...   Daughter is just upset because she said her brother told her that a doctor told him that the cancer had spread and is bad....   I advised [name] to speak to attending physician as she is on her way to the hospital now

## 2023-08-27 NOTE — Evaluation (Signed)
 Physical Therapy Evaluation Patient Details Name: Jenna Franklin MRN: [id number] DOB: April 03, 1940 Today's Date: 08/27/2023  History of Present Illness  Ms. Genella Bas is an 83 year old female with history of liver cirrhosis with splenomegaly and esophageal varices, anxiety, GERD, who presents emergency department for chief concerns of fall with resulting right hip pain. S/p R IM nailing 08/26/23.   Clinical Impression  Pt admitted with above diagnosis. Pt currently with functional limitations due to the deficits listed below (see PT Problem List). Pt received upright in bed agreeable to PT/OT co-eval after getting son involved on speaker phone to encourage pt. Reports unaware she was refusing PT or rehab in general earlier today. According to pt PTA she was fully independent with gait and her ADL's.   To date, pt very anxious with movement. Reports L hip pain is the leg hurting but after education provided it was her R hip she broke, she then reports R hip pain and not her L. She is able to marginally mobilize LE's to EOB but need heavy 2 person assist supine to sit with bed features. Pt very anxious throughout mobility needing encouragement throughout. She is able to stand modA+2 STS to RW with mod multimodal cuing and fair tolerance for ~2-3 minutes at CGA. VC's needed to maintain Ue's on RW as she likes to place on counter top due to anxiety and fear of falling and is unaware of increasing risk of falls with her hands on counter. Unable to take side steps to [facility] this date thus reliant on maxA+2 to return supine and upright in bed. All needs in reach. Pt will benefit from skilled PT services < 3 hours/day to address these deficits and maximize return to PLOF.      If plan is discharge home, recommend the following: Two people to help with walking and/or transfers;Two people to help with bathing/dressing/bathroom;Assistance with cooking/housework;Assist for transportation;Supervision due to  cognitive status;Help with stairs or ramp for entrance   Can travel by private vehicle   No    Equipment Recommendations Other (comment) (TBD by next venue of care)  Recommendations for Other Services       Functional Status Assessment Patient has had a recent decline in their functional status and demonstrates the ability to make significant improvements in function in a reasonable and predictable amount of time.     Precautions / Restrictions Precautions Precautions: Fall Recall of Precautions/Restrictions: Impaired Restrictions Weight Bearing Restrictions Per Provider Order: Yes RLE Weight Bearing Per Provider Order: Weight bearing as tolerated      Mobility  Bed Mobility Overal bed mobility: Needs Assistance Bed Mobility: Supine to Sit, Sit to Supine     Supine to sit: Max assist, +2 for physical assistance, +2 for safety/equipment, HOB elevated, Used rails Sit to supine: +2 for safety/equipment, Max assist     Patient Response: Anxious, Cooperative  Transfers Overall transfer level: Needs assistance Equipment used: Rolling walker (2 wheels) Transfers: Sit to/from Stand Sit to Stand: Mod assist, +2 physical assistance           General transfer comment: VC's for hand placement. Max TC's on pelvis for anterior weight shift    Ambulation/Gait               General Gait Details: unable to tolerate R lateral side steps  Stairs            Wheelchair Mobility     Tilt Bed Tilt Bed Patient Response: Anxious, Cooperative  Modified Rankin (Stroke  Patients Only)       Balance Overall balance assessment: Needs assistance Sitting-balance support: Bilateral upper extremity supported, Feet supported Sitting balance-Leahy Scale: Fair     Standing balance support: Bilateral upper extremity supported, Reliant on assistive device for balance, During functional activity Standing balance-Leahy Scale: Poor Standing balance comment: reliant on heavy  BUE support                             Pertinent Vitals/Pain Pain Assessment Pain Assessment: Faces Faces Pain Scale: Hurts even more Pain Location: R hip Pain Descriptors / Indicators: Moaning, Cramping, Crushing, Crying, Grimacing, Guarding Pain Intervention(s): Limited activity within patient's tolerance, Monitored during session, Repositioned, RN gave pain meds during session    Home Living Family/patient expects to be discharged to:: Private residence Living Arrangements: Alone Available Help at Discharge: Family;Available PRN/intermittently Type of Home: House Home Access: Stairs to enter   [employer] of Steps: pt unsure of number or rails   Home Layout: One level Home Equipment: Shower seat - built [employer] fundraiser (2 wheels);Cane - single point      Prior Function Prior Level of Function : Independent/Modified Independent             Mobility Comments: uses RW ADLs Comments: family assist PRN     Extremity/Trunk Assessment   Upper Extremity Assessment Upper Extremity Assessment: Generalized weakness    Lower Extremity Assessment Lower Extremity Assessment: Defer to PT evaluation RLE Deficits / Details: RLE ROM limitations due to pain       Communication   Communication Communication: No apparent difficulties    Cognition Arousal: Alert Behavior During Therapy: WFL for tasks assessed/performed   PT - Cognitive impairments: Difficult to assess, Memory                       PT - Cognition Comments: pt has periods of confusion and memory deficits. Does not remember refusing PT in the a.m. Following commands: Impaired Following commands impaired: Follows one step commands with increased time, Follows one step commands inconsistently     Cueing Cueing Techniques: Verbal cues, Tactile cues, Gestural cues, Visual cues     General Comments      Exercises Other Exercises Other Exercises: benefits of PT, WB status,  safe use of DME.   Assessment/Plan    PT Assessment Patient needs continued PT services  PT Problem List Decreased strength;Decreased balance;Decreased cognition;Decreased range of motion;Decreased mobility;Decreased knowledge of use of DME;Decreased activity tolerance;Decreased safety awareness       PT Treatment Interventions DME instruction;Therapeutic activities;Gait training;Therapeutic exercise;Patient/family education;Stair training;Balance training;Functional mobility training;Neuromuscular re-education    PT Goals (Current goals can be found in the Care Plan section)  Acute Rehab PT Goals Patient Stated Goal: to improve R hip pain PT Goal Formulation: With patient Time For Goal Achievement: 09/10/23 Potential to Achieve Goals: Fair    Frequency 7X/week     Co-evaluation PT/OT/SLP Co-Evaluation/Treatment: Yes Reason for Co-Treatment: Necessary to address cognition/behavior during functional activity;For patient/therapist safety PT goals addressed during session: Mobility/safety with mobility;Proper use of DME OT goals addressed during session: ADL's and self-care;Strengthening/ROM       AM-PAC PT 6 Clicks Mobility  Outcome Measure Help needed turning from your back to your side while in a flat bed without using bedrails?: Total Help needed moving from lying on your back to sitting on the side of a flat bed without using bedrails?: Total Help  needed moving to and from a bed to a chair (including a wheelchair)?: Total Help needed standing up from a chair using your arms (e.g., wheelchair or bedside chair)?: A Lot Help needed to walk in hospital room?: Total Help needed climbing 3-5 steps with a railing? : Total 6 Click Score: 7    End of Session Equipment Utilized During Treatment: Gait belt Activity Tolerance: Patient limited by pain Patient left: in bed;with call bell/phone within reach;with bed alarm set Nurse Communication: Mobility status PT Visit Diagnosis:  Muscle weakness (generalized) (M62.81);Difficulty in walking, not elsewhere classified (R26.2);Pain Pain - Right/Left: Right Pain - part of body: Hip    Time: 8670-8644 PT Time Calculation (min) (ACUTE ONLY): 26 min   Charges:   PT Evaluation $PT Eval Moderate Complexity: 1 Mod PT Treatments $Therapeutic Activity: 8-22 mins PT General Charges $$ ACUTE PT VISIT: 1 Visit         Dorina HERO. Fairly IV, PT, DPT Physical Therapist- [place]  [facility] 08/27/2023, 3:45 PM

## 2023-08-27 NOTE — Progress Notes (Signed)
 Subjective: 1 Day Post-Op Procedure(s) (LRB): FIXATION, FRACTURE, INTERTROCHANTERIC, WITH INTRAMEDULLARY ROD (Right) Patient reports pain as mild.   Patient is well, and has had no acute complaints or problems Denies any CP, SOB, ABD pain. We will continue therapy today.    Objective: Vital signs in last 24 hours: Temp:  [97.5 F (36.4 C)-98.6 F (37 C)] 98.4 F (36.9 C) (07/11 0708) Pulse Rate:  [93-109] 100 (07/11 0708) Resp:  [17-26] 17 (07/11 0708) BP: (108-134)/(52-82) 113/63 (07/11 0708) SpO2:  [92 %-100 %] 97 % (07/11 0708) Weight:  [56.5 kg] 56.5 kg (07/10 1143)  Intake/Output from previous day: 07/10 0701 - 07/11 0700 In: 740 [P.O.:240; I.V.:400; IV Piggyback:100] Out: 75 [Blood:75] Intake/Output this shift: No intake/output data recorded.  Recent Labs    08/26/23 1146 08/27/23 0456  HGB 10.6* 9.0*   Recent Labs    08/26/23 1146 08/27/23 0456  WBC 6.5 5.7  RBC 3.61* 3.13*  HCT 32.7* 28.1*  PLT 142* 112*   Recent Labs    08/26/23 1146 08/27/23 0456  NA 136 137  K 3.7 3.6  CL 105 104  CO2 20* 22  BUN 8 9  CREATININE 0.64 0.65  GLUCOSE 219* 190*  CALCIUM 8.2* 8.3*   No results for input(s): LABPT, INR in the last 72 hours.  EXAM General - Patient is Alert, Appropriate, and Confused Extremity - Neurovascular intact Sensation intact distally Intact pulses distally Dorsiflexion/Plantar flexion intact Dressing - dressing C/D/I and no drainage Motor Function - intact, moving foot and toes well on exam.   Past Medical History:  Diagnosis Date   Adjustment disorder with anxious mood 10/05/2013   Anxiety    Arthritis    back   Bronchitis    currently   Calculus of kidney 03/25/2015   CHF (congestive heart failure) (HCC)    pt says she thinks she has this   Cirrhosis of liver (HCC) 2016   Depression    Esophageal varices (HCC)    Esophageal varices (HCC)    Gastritis without bleeding 2019   GERD (gastroesophageal reflux disease)     H/O: depression    History of kidney stones    Hx of sinus tachycardia 07/06/2017   Hypertension    Nerve root pain 12/11/2011   Non-alcoholic fatty liver disease    Nonalcoholic fatty liver disease    Osteopenia    Osteopenia    Reflux    in past   Spinal stenosis    Tachycardia 03/25/2015   Tendinopathy of right gluteus medius 01/01/2017   Vertigo    3 yrs ago   Vitamin D deficiency    Vitamin D deficiency     Assessment/Plan:   1 Day Post-Op Procedure(s) (LRB): FIXATION, FRACTURE, INTERTROCHANTERIC, WITH INTRAMEDULLARY ROD (Right) Principal Problem:   Right femoral fracture (HCC) Active Problems:   Anxiety   Acid reflux   Cirrhosis of liver with ascites (HCC)   Esophageal varices in cirrhosis (HCC)   Colonic mass   Weight loss  Estimated body mass index is 22.07 kg/m as calculated from the following:   Height as of this encounter: 5' 3 (1.6 m).   Weight as of this encounter: 56.5 kg. Advance diet Up with therapy Pain well controlled Vital sign are stable Labs are stable, Hgb 9.0. Continue to monitor recheck labs in the am CM to assist with discharge, likely SNF  Follow up with [facility] in 2 weeks Lovenox  40 mg SQ daily x 14 days at discharge  DVT Prophylaxis - Lovenox , TED hose, and SCDs Weight-Bearing as tolerated to right leg   T. Medford Amber, PA-C [facility] 08/27/2023, 8:13 AM

## 2023-08-27 NOTE — Progress Notes (Signed)
 PT Cancellation Note  Patient Details Name: [name] MRN: [id number] DOB: [birth year]   Cancelled Treatment:    Reason Eval/Treat Not Completed: Other (comment). Orders received and chart reviewed. Upon entry pt denying participation with PT. Unwilling to be touched or allow bed to be adjusted. Pt under the impression her other medical doctors in the [facility] care system will be the ones assisting her and teaching her how to move safely. She is not willing to attempt participating with staff she does not know or have not met. PT educating pt on current situation, how medical processes work and that rehab's job is to assist her and teach her how to safely mobilize. Pt becomes mildly agitated continuing to refuse. Medical team updated. Will re-attempt after discussion with family and medical team.    Shandy Vi M. Fairly IV, PT, DPT Physical Therapist- [place]  [facility] 08/27/2023, 10:05 AM

## 2023-08-27 NOTE — Evaluation (Signed)
 Occupational Therapy Evaluation Patient Details Name: Jenna Franklin MRN: [id number] DOB: January 16, 1941 Today's Date: 08/27/2023   History of Present Illness   [name] is an 83 year old female with history of liver cirrhosis with splenomegaly and esophageal varices, anxiety, GERD, who presents emergency department for chief concerns of fall with resulting right hip pain. S/p R IM nailing 08/26/23.     Clinical Impressions Pt admitted with above diagnosis. Pt seen for OT/PT co-eval to maximize therapeutic outcomes and for pt/therapist safety. Pt anxious, requires cues for breathing strategies and benefits from constant reassurance throughout session. Prior to hospital admission, pt lives alone and is independent. Pt pleasantly confused, does not seem to be aware of general situation or remember details, and has poor attention to task performance. Pt requires +2 MAX A for bed mobility, stands with RW and MOD A +2, but unable to take steps. Pt with decreased safety awareness and tends to place hands on countertop vs RW. Anticipate pt will require up to MAX A for LB ADLs and MIN A for UB ADLs. Pt would benefit from skilled OT services to address noted impairments and functional limitations (see below for any additional details) in order to maximize safety and independence while minimizing falls risk and caregiver burden. Anticipate the need for follow up OT services upon acute hospital DC. Patient will benefit from continued inpatient follow up therapy, <3 hours/day      If plan is discharge home, recommend the following:   Two people to help with walking and/or transfers;A lot of help with bathing/dressing/bathroom;Assistance with cooking/housework;Assist for transportation;Supervision due to cognitive status;Direct supervision/assist for medications management;Direct supervision/assist for financial management;Help with stairs or ramp for entrance     Functional Status Assessment    Patient has had a recent decline in their functional status and demonstrates the ability to make significant improvements in function in a reasonable and predictable amount of time.     Equipment Recommendations   None recommended by OT      Precautions/Restrictions   Precautions Precautions: Fall Recall of Precautions/Restrictions: Impaired Restrictions Weight Bearing Restrictions Per Provider Order: Yes RLE Weight Bearing Per Provider Order: Weight bearing as tolerated     Mobility Bed Mobility Overal bed mobility: Needs Assistance Bed Mobility: Supine to Sit, Sit to Supine     Supine to sit: Max assist, +2 for physical assistance, +2 for safety/equipment, HOB elevated, Used rails Sit to supine: +2 for safety/equipment, Max assist   General bed mobility comments: benefits from constant reassurance, cues for breathing strategies, initiates movement with RLE minimally Patient Response: Anxious, Cooperative  Transfers Overall transfer level: Needs assistance Equipment used: Rolling walker (2 wheels) Transfers: Sit to/from Stand Sit to Stand: Mod assist, +2 physical assistance           General transfer comment: verbal cues for hand placement      Balance Overall balance assessment: Needs assistance Sitting-balance support: Bilateral upper extremity supported, Feet supported Sitting balance-Leahy Scale: Fair     Standing balance support: Bilateral upper extremity supported, Reliant on assistive device for balance, During functional activity Standing balance-Leahy Scale: Poor Standing balance comment: reliant on heavy BUE support                           ADL either performed or assessed with clinical judgement   ADL Overall ADL's : Needs assistance/impaired Eating/Feeding: Sitting;Independent   Grooming: Sitting;Set up   Upper Body Bathing: Sitting;Minimal assistance  Lower Body Bathing: Maximal assistance;Sit to/from stand   Upper Body  Dressing : Sitting;Minimal assistance   Lower Body Dressing: Maximal assistance;Sit to/from Market researcher Details (indicate cue type and reason): unable to progress mobility to attempt transfer, will need bed level toileting Toileting- Clothing Manipulation and Hygiene: Bed level;Maximal assistance;Sit to/from stand       Functional mobility during ADLs: +2 for physical assistance;+2 for safety/equipment;Rolling walker (2 wheels);Cueing for sequencing;Cueing for safety General ADL Comments: pt often requires cues for deep breathing as she is anxious, requiring reassurance throughout. anticipate up to MAX A for LB ADLs      Pertinent Vitals/Pain Pain Assessment Pain Assessment: Faces Faces Pain Scale: Hurts even more Pain Location: R hip Pain Descriptors / Indicators: Moaning, Cramping, Crushing, Crying, Grimacing, Guarding Pain Intervention(s): Limited activity within patient's tolerance, Monitored during session, Repositioned, RN gave pain meds during session     Extremity/Trunk Assessment Upper Extremity Assessment Upper Extremity Assessment: Generalized weakness   Lower Extremity Assessment Lower Extremity Assessment: Defer to PT evaluation RLE Deficits / Details: RLE ROM limitations due to pain       Communication Communication Communication: No apparent difficulties   Cognition Arousal: Alert Behavior During Therapy: Anxious Cognition: No family/caregiver present to determine baseline             OT - Cognition Comments: pt anxious, impaired attention + executive functioning, seems to be masking her overall cognitive status (is confused, but frequently changes topic and redirects self)                 Following commands: Impaired Following commands impaired: Follows one step commands with increased time, Follows one step commands inconsistently     Cueing  General Comments   Cueing Techniques: Verbal cues;Tactile cues;Gestural cues;Visual  cues  VSS, MD in room start of session, son on phone to reassure patient. honeycomb dressing intact start/end of session.           Home Living Family/patient expects to be discharged to:: Private residence Living Arrangements: Alone Available Help at Discharge: Family;Available PRN/intermittently Type of Home: House Home Access: Stairs to enter [employer] of Steps: pt unsure of number or rails   Home Layout: One level     Bathroom Shower/Tub: Producer, television/film/video: Handicapped height Bathroom Accessibility: Yes   Home Equipment: Shower seat - built Charity fundraiser (2 wheels);Cane - single point          Prior Functioning/Environment Prior Level of Function : Independent/Modified Independent             Mobility Comments: uses RW ADLs Comments: family assist PRN    OT Problem List: Decreased strength;Decreased knowledge of use of DME or AE;Decreased range of motion;Decreased knowledge of precautions;Decreased activity tolerance;Decreased cognition;Impaired balance (sitting and/or standing);Decreased safety awareness   OT Treatment/Interventions: Self-care/ADL training;Therapeutic exercise;Patient/family education;Balance training;Therapeutic activities;DME and/or AE instruction      OT Goals(Current goals can be found in the care plan section)   Acute Rehab OT Goals OT Goal Formulation: With patient Time For Goal Achievement: 09/10/23 Potential to Achieve Goals: Good   OT Frequency:  Min 2X/week    Co-evaluation PT/OT/SLP Co-Evaluation/Treatment: Yes Reason for Co-Treatment: Necessary to address cognition/behavior during functional activity;For patient/therapist safety PT goals addressed during session: Mobility/safety with mobility;Proper use of DME OT goals addressed during session: ADL's and self-care;Strengthening/ROM      AM-PAC OT 6 Clicks Daily Activity     Outcome Measure Help  from another person eating meals?:  None Help from another person taking care of personal grooming?: A Little Help from another person toileting, which includes using toliet, bedpan, or urinal?: A Lot Help from another person bathing (including washing, rinsing, drying)?: A Lot Help from another person to put on and taking off regular upper body clothing?: A Little Help from another person to put on and taking off regular lower body clothing?: A Lot 6 Click Score: 16   End of Session Equipment Utilized During Treatment: Gait belt;Rolling walker (2 wheels) Nurse Communication: Mobility status  Activity Tolerance: Patient tolerated treatment well Patient left: in bed;with call bell/phone within reach;with bed alarm set;with SCD's reapplied  OT Visit Diagnosis: Unsteadiness on feet (R26.81);History of falling (Z91.81);Other abnormalities of gait and mobility (R26.89);Muscle weakness (generalized) (M62.81);Pain Pain - Right/Left: Right Pain - part of body: Hip;Leg                Time: 8677-8644 OT Time Calculation (min): 33 min Charges:  OT General Charges $OT Visit: 1 Visit OT Evaluation $OT Eval Low Complexity: 1 Low  Zamariah Seaborn L. Leahna Hewson, OTR/L  08/27/23, 3:48 PM

## 2023-08-27 NOTE — Progress Notes (Addendum)
 The above named patient is recommended to go to Short Term Rehab for strengthening and gait training for balance.  It is expected that the Short Term Rehab stay will be less than 30 days.  The patient is expected to return home after Rehab.

## 2023-08-28 DIAGNOSIS — S72001A Fracture of unspecified part of neck of right femur, initial encounter for closed fracture: Secondary | ICD-10-CM | POA: Diagnosis not present

## 2023-08-28 LAB — BASIC METABOLIC PANEL WITH GFR
Anion gap: 9 (ref 5–15)
BUN: 11 mg/dL (ref 8–23)
CO2: 24 mmol/L (ref 22–32)
Calcium: 8.9 mg/dL (ref 8.9–10.3)
Chloride: 100 mmol/L (ref 98–111)
Creatinine, Ser: 0.68 mg/dL (ref 0.44–1.00)
GFR, Estimated: >60 mL/min (ref >60)
Glucose, Bld: 200 mg/dL — ABNORMAL HIGH (ref 70–99)
Potassium: 3.7 mmol/L (ref 3.5–5.1)
Sodium: 133 mmol/L — ABNORMAL LOW (ref 135–145)

## 2023-08-28 LAB — CBC
HCT: 31.1 % — ABNORMAL LOW (ref 36.0–46.0)
Hemoglobin: 9.9 g/dL — ABNORMAL LOW (ref 12.0–15.0)
MCH: 29.1 pg (ref 26.0–34.0)
MCHC: 31.8 g/dL (ref 30.0–36.0)
MCV: 91.5 fL (ref 80.0–100.0)
Platelets: 189 K/uL (ref 150–400)
RBC: 3.4 MIL/uL — ABNORMAL LOW (ref 3.87–5.11)
RDW: 16 % — ABNORMAL HIGH (ref 11.5–15.5)
WBC: 9.3 K/uL (ref 4.0–10.5)
nRBC: 0 % (ref 0.0–0.2)

## 2023-08-28 MED ORDER — HYDROCODONE-ACETAMINOPHEN 5-325 MG PO TABS
1.0000 | ORAL_TABLET | Freq: Four times a day (QID) | ORAL | Status: DC | PRN
  Administered 2023-08-29 – 2023-09-04 (×7): 1 via ORAL
  Filled 2023-08-28 (×8): qty 1

## 2023-08-28 MED ORDER — LORAZEPAM 2 MG/ML IJ SOLN
0.5000 mg | Freq: Four times a day (QID) | INTRAMUSCULAR | Status: DC | PRN
  Administered 2023-08-31: 0.5 mg via INTRAVENOUS
  Filled 2023-08-28 (×2): qty 1

## 2023-08-28 NOTE — Progress Notes (Addendum)
 PROGRESS NOTE    Jenna Franklin   FMW:[id number] DOB: 03-Dec-1940  DOA: 08/26/2023 Date of Service: 08/28/23 which is hospital day 2  PCP: Justus Leita DEL, MD    Hospital course / significant events:   HPI: [name] is an 83 year old female with history of liver cirrhosis with splenomegaly and esophageal varices, anxiety, GERD, who presents emergency department for chief concerns of fall with resulting right hip pain.  07/10: admitted to hospitalist service. Underwent R hip fracture repair 07/11: PT/OT recs for SNF.  Discussion today with family regarding colon mass and options re further w/u  07/12: stable, await SNF thru the weekend      Consultants:  Orthopedics   Procedures/Surgeries: Right Hip Intramedullary nail repair 08/26/2023 w/ Dr Lorelle      ASSESSMENT & PLAN:   Right femoral fracture Secondary to fall S/p Right Hip Intramedullary nail repair 08/26/2023 w/ Dr Lorelle Orthopedic surgery following Pain control WBAT  Fall precaution PT, OT Follow up with [facility] in 2 weeks Lovenox  40 mg SQ daily x 14 days at discharge  Colonic mass with infiltration into omentum, likely malignancy Pulmonary nodule, malignancy cannot be excluded Weight loss Given CT read of colonic mass with omental implants and small pulmonary nodules, high suspicion for malignancy and metastatic disease cannot be excluded at this time Pt's son and daughter ask to defer conversation w/ patient for now until more lucid, hx memory problems / dementia and family thinks she will not remember or process the conversation right now  Outpatient follow up, consider colonoscopy - Dr Jinny GI spoke 07/11 with patient's daughter, holding off on colonoscopy for now and considering overall frailty and other comorbidities may not be a good candidate for aggressive workup / treatment but would recommend further discussion with oncology team Oncology consult, nonemergent   Cirrhosis  complicated by Esophageal varices Appears compensated at this time and not in acute exacerbation Continue outpatient follow-up with GI specialist as appropriate  Anxiety PDMP reviewed. currently no active prescription outpatient Prn ativan  as needed     No concerns based on BMI: Body mass index is 22.07 kg/m.SABRA Significantly low or high BMI is associated with higher medical risk.  Underweight - under 18  overweight - 25 to 29 obese - 30 or more Class 1 obesity: BMI of 30.0 to 34 Class 2 obesity: BMI of 35.0 to 39 Class 3 obesity: BMI of 40.0 to 49 Super Morbid Obesity: BMI 50-59 Super-super Morbid Obesity: BMI 60+ Healthy nutrition and physical activity advised as adjunct to other disease management and risk reduction treatments    DVT prophylaxis: Lovenox  IV fluids: no continuous IV fluids  Nutrition: Regular diet Central lines / other devices: None  Code Status: Full code ACP documentation reviewed:  none on file in [facility]  TOC needs: SNF placement for rehab Medical barriers to dispo: None at this time             Subjective / Brief ROS:  Patient reports no concerns Remains confused  Denies CP/SOB.    Family Communication: called daughter 08/28/23 4:53 PM all questions answered    Objective Findings:  Vitals:   08/27/23 2029 08/27/23 2336 08/28/23 0518 08/28/23 0747  BP: 133/74 132/71 (!) 119/58 108/63  Pulse: (!) 107 (!) 103 (!) 106 (!) 103  Resp: 18 16 18 17   Temp: 98.1 F (36.7 C) 98 F (36.7 C) 97.6 F (36.4 C) 97.8 F (36.6 C)  TempSrc: Oral     SpO2:  98% 96% 96% 97%  Weight:      Height:        Intake/Output Summary (Last 24 hours) at 08/28/2023 1423 Last data filed at 08/28/2023 1000 Gross per 24 hour  Intake 717.03 ml  Output 617 ml  Net 100.03 ml   Filed Weights   08/26/23 1143  Weight: 56.5 kg    Examination:  Physical Exam Constitutional:      General: [name]. Cardiovascular:     Rate and Rhythm:  Normal rate and regular rhythm.  Pulmonary:     Effort: Pulmonary effort is normal.     Breath sounds: Normal breath sounds.  Neurological:     General: No focal deficit present.     Mental Status: She is alert. She is disoriented.  Psychiatric:        Mood and Affect: Mood normal.        Behavior: Behavior normal.          Scheduled Medications:   Chlorhexidine  Gluconate Cloth  6 each Topical Daily   docusate sodium   100 mg Oral BID   enoxaparin  (LOVENOX ) injection  40 mg Subcutaneous Q24H   feeding supplement  237 mL Oral BID BM   multivitamin with minerals  1 tablet Oral Daily    Continuous Infusions:    PRN Medications:  acetaminophen , fentaNYL  (SUBLIMAZE ) injection, HYDROcodone -acetaminophen , HYDROcodone -acetaminophen , LORazepam , menthol -cetylpyridinium **OR** phenol, metoCLOPramide  **OR** metoCLOPramide  (REGLAN ) injection, morphine  injection, ondansetron  **OR** ondansetron  (ZOFRAN ) IV  Antimicrobials from admission:  Anti-infectives (From admission, onward)    Start     Dose/Rate Route Frequency Ordered Stop   08/26/23 2300  ceFAZolin  (ANCEF ) IVPB 2g/100 mL premix        2 g 200 mL/hr over 30 Minutes Intravenous Every 6 hours 08/26/23 2015 08/27/23 1059   08/26/23 1545  ceFAZolin  (ANCEF ) IVPB 2g/100 mL premix        2 g 200 mL/hr over 30 Minutes Intravenous  Once 08/26/23 1544 08/26/23 1628           Data Reviewed:  I have personally reviewed the following...  CBC: Recent Labs  Lab 08/26/23 1146 08/27/23 0456 08/28/23 1045  WBC 6.5 5.7 9.3  HGB 10.6* 9.0* 9.9*  HCT 32.7* 28.1* 31.1*  MCV 90.6 89.8 91.5  PLT 142* 112* 189   Basic Metabolic Panel: Recent Labs  Lab 08/26/23 1146 08/27/23 0456 08/28/23 1045  NA 136 137 133*  K 3.7 3.6 3.7  CL 105 104 100  CO2 20* 22 24  GLUCOSE 219* 190* 200*  BUN 8 9 11   CREATININE 0.64 0.65 0.68  CALCIUM 8.2* 8.3* 8.9   GFR: Estimated Creatinine Clearance: 44.1 mL/min (by C-G formula based on SCr  of 0.68 mg/dL). Liver Function Tests: Recent Labs  Lab 08/26/23 1146  AST 40  ALT 17  ALKPHOS 59  BILITOT 1.6*  PROT 6.5  ALBUMIN 2.9*   No results for input(s): LIPASE, AMYLASE in the last 168 hours. Recent Labs  Lab 08/26/23 1242  AMMONIA 23   Coagulation Profile: No results for input(s): INR, PROTIME in the last 168 hours. Cardiac Enzymes: Recent Labs  Lab 08/26/23 1146  CKTOTAL 45   BNP (last 3 results) No results for input(s): PROBNP in the last 8760 hours. HbA1C: No results for input(s): HGBA1C in the last 72 hours. CBG: No results for input(s): GLUCAP in the last 168 hours. Lipid Profile: No results for input(s): CHOL, HDL, LDLCALC, TRIG, CHOLHDL, LDLDIRECT in the last 72  hours. Thyroid  Function Tests: No results for input(s): TSH, T4TOTAL, FREET4, T3FREE, THYROIDAB in the last 72 hours. Anemia Panel: No results for input(s): VITAMINB12, FOLATE, FERRITIN, TIBC, IRON, RETICCTPCT in the last 72 hours. Most Recent Urinalysis On File:     Component Value Date/Time   COLORURINE STRAW (A) 04/21/2019 1455   APPEARANCEUR CLEAR 04/21/2019 1455   LABSPEC 1.010 04/21/2019 1455   PHURINE 6.0 04/21/2019 1455   GLUCOSEU NEGATIVE 04/21/2019 1455   HGBUR NEGATIVE 04/21/2019 1455   BILIRUBINUR neg 09/06/2019 0833   KETONESUR NEGATIVE 04/21/2019 1455   PROTEINUR Negative 09/06/2019 0833   PROTEINUR NEGATIVE 04/21/2019 1455   UROBILINOGEN 0.2 09/06/2019 0833   NITRITE neg 09/06/2019 0833   NITRITE NEGATIVE 04/21/2019 1455   LEUKOCYTESUR Negative 09/06/2019 0833   LEUKOCYTESUR NEGATIVE 04/21/2019 1455   Sepsis Labs: @LABRCNTIP (procalcitonin:4,lacticidven:4) Microbiology: No results found for this or any previous visit (from the past 240 hours).    Radiology Studies last 3 days: DG HIP UNILAT WITH PELVIS 2-3 VIEWS RIGHT Result Date: 08/26/2023 CLINICAL DATA:  ORIF of the right hip. EXAM: DG HIP (WITH OR WITHOUT PELVIS)  2-3V RIGHT COMPARISON:  Right hip radiograph dated 08/25/2025. FINDINGS: Six intraoperative fluoroscopic spot images provided. The total fluoroscopic time is 1 minute, 22 seconds with a cumulative air Karma of 17.06 mGy. Status post ORIF of the right femur. IMPRESSION: Intraoperative fluoroscopic guidance for ORIF of the right femur. Electronically Signed   By: Vanetta Chou M.D.   On: 08/26/2023 17:20   DG C-Arm 1-60 Min-No Report Result Date: 08/26/2023 Fluoroscopy was utilized by the requesting physician.  No radiographic interpretation.   CT CHEST ABDOMEN PELVIS WO CONTRAST Result Date: 08/26/2023 CLINICAL DATA:  Trauma. EXAM: CT CHEST, ABDOMEN AND PELVIS WITHOUT CONTRAST TECHNIQUE: Multidetector CT imaging of the chest, abdomen and pelvis was performed following the standard protocol without IV contrast. RADIATION DOSE REDUCTION: This exam was performed according to the departmental dose-optimization program which includes automated exposure control, adjustment of the mA and/or kV according to patient size and/or use of iterative reconstruction technique. COMPARISON:  None Available. FINDINGS: Evaluation of this exam is limited in the absence of intravenous contrast. CT CHEST FINDINGS Cardiovascular: There is no cardiomegaly or pericardial effusion. The thoracic aorta and the central pulmonary arteries are grossly unremarkable. There is a left-sided aortic arch with aberrant right subclavian artery anatomy. Mediastinum/Nodes: No hilar or mediastinal adenopathy. Small hiatal hernia. The esophagus is grossly unremarkable. No mediastinal fluid collection. Lungs/Pleura: Minimal bibasilar subpleural atelectasis. There is a 7 mm right middle lobe and a 4 mm right middle lobe nodules. A 9 mm nodular density at the right lung base along the diaphragm. No consolidative changes. There is no pleural effusion pneumothorax. The central airways are patent. Musculoskeletal: Osteopenia. Old T12 compression fracture.  No acute osseous pathology. CT ABDOMEN PELVIS FINDINGS No intra-abdominal free air.  Small ascites. Hepatobiliary: Cirrhosis.  No biliary dilatation.  Cholecystectomy. Pancreas: Unremarkable. No pancreatic ductal dilatation or surrounding inflammatory changes. Spleen: Splenomegaly measuring 15 cm in length. Adrenals/Urinary Tract: The adrenal glands unremarkable. Small right renal upper pole cyst. There is no hydronephrosis or nephrolithiasis on either side. The visualized ureters and urinary bladder marked. Stomach/Bowel: There is sigmoid diverticulosis. Diffuse thickened appearance of the colon may be related to hepatic colopathy and size. Colitis is not excluded. There is however an area of irregularity and thickening of the colonic wall with nodular protrusion involving the proximal transverse colon (axial 70/4 and coronal 28/7) concerning for malignancy. Further evaluation  with colonoscopy recommended. There is no bowel obstruction. The appendix is not well visualized due to ascites. Vascular/Lymphatic: Mild aortoiliac atherosclerotic disease. The IVC is unremarkable. No portal venous gas. No adenopathy. Several small nodules adjacent to the proximal transverse colon as well as small omental nodules in the right upper abdomen (64/4) metastasis. Reproductive: Hysterectomy. Other: Soft tissue nodules in the anterior abdomen and involving the right rectus sheath measure up to 3.8 x 2.0 cm consistent with metastatic implants. Musculoskeletal: Osteopenia with degenerative changes of the spine. Old L1 compression fracture with anterior wedging. There is mildly angulated fracture of the right femoral neck involving the basicervical and trochanteric region. IMPRESSION: 1. Mildly angulated fracture of the right femoral neck. 2. No acute/traumatic intrathoracic pathology. 3. Cirrhosis with splenomegaly and small ascites. 4. Sigmoid diverticulosis. 5. Findings concerning for colonic mass/malignancy of the proximal  transverse colon with omental implants. Further evaluation with colonoscopy is recommended. 6. Small pulmonary nodules, metastatic disease is not excluded. 7.  Aortic Atherosclerosis (ICD10-I70.0). Electronically Signed   By: Vanetta Chou M.D.   On: 08/26/2023 14:58   CT T-SPINE NO CHARGE Result Date: 08/26/2023 CLINICAL DATA:  Trauma. EXAM: CT THORACIC AND LUMBAR SPINE WITHOUT CONTRAST TECHNIQUE: Multidetector CT imaging of the thoracic and lumbar spine was performed without contrast. Multiplanar CT image reconstructions were also generated. RADIATION DOSE REDUCTION: This exam was performed according to the departmental dose-optimization program which includes automated exposure control, adjustment of the mA and/or kV according to patient size and/or use of iterative reconstruction technique. COMPARISON:  Lumbar spine CT dated 08/19/2020. FINDINGS: CT THORACIC SPINE FINDINGS Alignment: No acute subluxation. Vertebrae: No acute fracture. The bones are osteopenic. Old T12 compression fracture with approximately 50% loss of vertebral body height anteriorly and anterior wedging. This is relatively similar to the lumbar spine CT of 08/19/2020. Paraspinal and other soft tissues: Negative. Disc levels: No acute findings with degenerative changes. CT LUMBAR SPINE FINDINGS Segmentation: 5 lumbar type vertebrae. Alignment: No acute subluxation. Vertebrae: Old appearing L1 compression fracture with approximately 50% loss of vertebral body height and anterior wedging, new since the prior CT. No definite acute fracture. The bones are osteopenic. Paraspinal and other soft tissues: Negative. Disc levels: Degenerative changes. IMPRESSION: 1. No acute/traumatic thoracic or lumbar spine pathology. 2. Old T12 and L1 compression fractures. Electronically Signed   By: Vanetta Chou M.D.   On: 08/26/2023 14:47   CT L-SPINE NO CHARGE Result Date: 08/26/2023 CLINICAL DATA:  Trauma. EXAM: CT THORACIC AND LUMBAR SPINE WITHOUT  CONTRAST TECHNIQUE: Multidetector CT imaging of the thoracic and lumbar spine was performed without contrast. Multiplanar CT image reconstructions were also generated. RADIATION DOSE REDUCTION: This exam was performed according to the departmental dose-optimization program which includes automated exposure control, adjustment of the mA and/or kV according to patient size and/or use of iterative reconstruction technique. COMPARISON:  Lumbar spine CT dated 08/19/2020. FINDINGS: CT THORACIC SPINE FINDINGS Alignment: No acute subluxation. Vertebrae: No acute fracture. The bones are osteopenic. Old T12 compression fracture with approximately 50% loss of vertebral body height anteriorly and anterior wedging. This is relatively similar to the lumbar spine CT of 08/19/2020. Paraspinal and other soft tissues: Negative. Disc levels: No acute findings with degenerative changes. CT LUMBAR SPINE FINDINGS Segmentation: 5 lumbar type vertebrae. Alignment: No acute subluxation. Vertebrae: Old appearing L1 compression fracture with approximately 50% loss of vertebral body height and anterior wedging, new since the prior CT. No definite acute fracture. The bones are osteopenic. Paraspinal and other soft tissues:  Negative. Disc levels: Degenerative changes. IMPRESSION: 1. No acute/traumatic thoracic or lumbar spine pathology. 2. Old T12 and L1 compression fractures. Electronically Signed   By: Vanetta Chou M.D.   On: 08/26/2023 14:47   DG Elbow Complete Right Result Date: 08/26/2023 CLINICAL DATA:  Fall. EXAM: RIGHT ELBOW - COMPLETE 3+ VIEW COMPARISON:  08/19/2020. FINDINGS: There is no evidence of fracture, dislocation, or joint effusion. Mild degenerative spurring of the coronoid process of the ulna. Soft tissues are unremarkable. IMPRESSION: No acute fracture or dislocation of the right elbow. Electronically Signed   By: Harrietta Sherry M.D.   On: 08/26/2023 14:08   DG FEMUR, MIN 2 VIEWS RIGHT Result Date:  08/26/2023 CLINICAL DATA:  Right hip pain after fall. EXAM: RIGHT FEMUR 2 VIEWS COMPARISON:  Same-day pelvic radiographs dated 08/26/2023 at 12:03 p.m. FINDINGS: Diffuse osseous demineralization. Redemonstrated probable basicervical fracture of the right proximal femur. Intertrochanteric extension cannot be excluded. No additional fracture identified. No dislocation. Peripheral vascular calcifications. IMPRESSION: Redemonstrated probable basicervical fracture of the right proximal femur. Intertrochanteric extension cannot be excluded. No additional fracture identified. Electronically Signed   By: Harrietta Sherry M.D.   On: 08/26/2023 14:06   CT Head Wo Contrast Result Date: 08/26/2023 CLINICAL DATA:  Altered mental status.  Fall. EXAM: CT HEAD WITHOUT CONTRAST CT CERVICAL SPINE WITHOUT CONTRAST TECHNIQUE: Multidetector CT imaging of the head and cervical spine was performed following the standard protocol without intravenous contrast. Multiplanar CT image reconstructions of the cervical spine were also generated. RADIATION DOSE REDUCTION: This exam was performed according to the departmental dose-optimization program which includes automated exposure control, adjustment of the mA and/or kV according to patient size and/or use of iterative reconstruction technique. COMPARISON:  None Available. FINDINGS: CT HEAD FINDINGS Brain: No acute intracranial hemorrhage. No focal mass lesion. No CT evidence of acute infarction. No midline shift or mass effect. No hydrocephalus. Basilar cisterns are patent. There are periventricular and subcortical white matter hypodensities. Generalized cortical atrophy. Vascular: No hyperdense vessel or unexpected calcification. Skull: Normal. Negative for fracture or focal lesion. Sinuses/Orbits: Paranasal sinuses and mastoid air cells are clear. Orbits are clear. Other: None. CT CERVICAL SPINE FINDINGS Alignment: Normal alignment of the cervical vertebral bodies. Skull base and  vertebrae: Normal craniocervical junction. No loss of vertebral body height or disc height. Normal facet articulation. No evidence of fracture. Soft tissues and spinal canal: No prevertebral soft tissue swelling. No perispinal or epidural hematoma. Disc levels: Bulky RIGHT facet hypertrophy from C3 to C5. Anterior endplate spurring R4-R2. No acute findings Upper chest: Clear Other: None IMPRESSION: HEAD CT: 1. No acute intracranial findings. 2. Atrophy and white matter microvascular disease. CERVICAL SPINE CT: 1. No cervical spine fracture. 2. Cervical spondylosis. Electronically Signed   By: Jackquline Boxer M.D.   On: 08/26/2023 12:32   CT Cervical Spine Wo Contrast Result Date: 08/26/2023 CLINICAL DATA:  Altered mental status.  Fall. EXAM: CT HEAD WITHOUT CONTRAST CT CERVICAL SPINE WITHOUT CONTRAST TECHNIQUE: Multidetector CT imaging of the head and cervical spine was performed following the standard protocol without intravenous contrast. Multiplanar CT image reconstructions of the cervical spine were also generated. RADIATION DOSE REDUCTION: This exam was performed according to the departmental dose-optimization program which includes automated exposure control, adjustment of the mA and/or kV according to patient size and/or use of iterative reconstruction technique. COMPARISON:  None Available. FINDINGS: CT HEAD FINDINGS Brain: No acute intracranial hemorrhage. No focal mass lesion. No CT evidence of acute infarction. No midline shift or  mass effect. No hydrocephalus. Basilar cisterns are patent. There are periventricular and subcortical white matter hypodensities. Generalized cortical atrophy. Vascular: No hyperdense vessel or unexpected calcification. Skull: Normal. Negative for fracture or focal lesion. Sinuses/Orbits: Paranasal sinuses and mastoid air cells are clear. Orbits are clear. Other: None. CT CERVICAL SPINE FINDINGS Alignment: Normal alignment of the cervical vertebral bodies. Skull base and  vertebrae: Normal craniocervical junction. No loss of vertebral body height or disc height. Normal facet articulation. No evidence of fracture. Soft tissues and spinal canal: No prevertebral soft tissue swelling. No perispinal or epidural hematoma. Disc levels: Bulky RIGHT facet hypertrophy from C3 to C5. Anterior endplate spurring R4-R2. No acute findings Upper chest: Clear Other: None IMPRESSION: HEAD CT: 1. No acute intracranial findings. 2. Atrophy and white matter microvascular disease. CERVICAL SPINE CT: 1. No cervical spine fracture. 2. Cervical spondylosis. Electronically Signed   By: Jackquline Boxer M.D.   On: 08/26/2023 12:32   DG Chest Portable 1 View Result Date: 08/26/2023 CLINICAL DATA:  Fall, right hip fracture EXAM: PORTABLE CHEST 1 VIEW COMPARISON:  08/19/2020 FINDINGS: The patient is rotated to the left on today's radiograph, reducing diagnostic sensitivity and specificity. Low lung volumes are present, causing crowding of the pulmonary vasculature. Age indeterminate deformity of the left sixth rib posterolaterally, correlate with any tenderness in this vicinity in determining acuity. Accounting for the low lung volumes, the lungs appear clear. Cardiac and mediastinal margins appear normal. IMPRESSION: 1. Age indeterminate fracture of the left sixth rib posterolaterally, correlate with any tenderness in this vicinity in determining acuity. 2. Low lung volumes. Electronically Signed   By: Ryan Salvage M.D.   On: 08/26/2023 12:26   DG Hip Unilat With Pelvis 2-3 Views Right Result Date: 08/26/2023 CLINICAL DATA:  Fall, right hip pain EXAM: DG HIP (WITH OR WITHOUT PELVIS) 2-3V RIGHT COMPARISON:  None Available. FINDINGS: Suboptimal positioning. High suspicion for fracture, either basicervical or intertrochanteric. Intra-articular femoral neck fracture favored but not absolutely certain. Consider CT of the right hip for differentiation. Spurring of both acetabula.  Bony demineralization.  IMPRESSION: 1. Right proximal femoral fracture likely basicervical but possibly intertrochanteric. CT could help differentiate. 2. Bony demineralization. 3. Spurring of both acetabula. Electronically Signed   By: Ryan Salvage M.D.   On: 08/26/2023 12:24       Time spent: 50 min     Laneta Blunt, DO [facility] 08/28/2023, 2:23 PM    Dictation software may have been used to generate the above note. Typos may occur and escape review in typed/dictated notes. Please contact Dr Blunt directly for clarity if needed.  Staff may message me via secure chat in Epic  but this may not receive an immediate response,  please page me for urgent matters!  If 7PM-7AM, please contact night coverage [email or web address]

## 2023-08-28 NOTE — Progress Notes (Signed)
 Subjective: 2 Days Post-Op Procedure(s) (LRB): FIXATION, FRACTURE, INTERTROCHANTERIC, WITH INTRAMEDULLARY ROD (Right) Patient reports pain as mild.   Patient is well, and has had no acute complaints or problems Denies any CP, SOB, ABD pain. We will continue therapy today.    Objective: Vital signs in last 24 hours: Temp:  [97.6 F (36.4 C)-98.4 F (36.9 C)] 97.6 F (36.4 C) (07/12 0518) Pulse Rate:  [99-107] 106 (07/12 0518) Resp:  [16-18] 18 (07/12 0518) BP: (113-133)/(58-77) 119/58 (07/12 0518) SpO2:  [96 %-98 %] 96 % (07/12 0518)  Intake/Output from previous day: 07/11 0701 - 07/12 0700 In: 477 [I.V.:477] Out: 617 [Urine:617] Intake/Output this shift: Total I/O In: 477 [I.V.:477] Out: 617 [Urine:617]  Recent Labs    08/26/23 1146 08/27/23 0456  HGB 10.6* 9.0*   Recent Labs    08/26/23 1146 08/27/23 0456  WBC 6.5 5.7  RBC 3.61* 3.13*  HCT 32.7* 28.1*  PLT 142* 112*   Recent Labs    08/26/23 1146 08/27/23 0456  NA 136 137  K 3.7 3.6  CL 105 104  CO2 20* 22  BUN 8 9  CREATININE 0.64 0.65  GLUCOSE 219* 190*  CALCIUM 8.2* 8.3*   No results for input(s): LABPT, INR in the last 72 hours.  EXAM General - Patient is Alert, Appropriate, and Confused Extremity - Neurovascular intact Sensation intact distally Intact pulses distally Dorsiflexion/Plantar flexion intact Dressing - dressing C/D/I and no drainage Motor Function - intact, moving foot and toes well on exam.   Past Medical History:  Diagnosis Date   Adjustment disorder with anxious mood 10/05/2013   Anxiety    Arthritis    back   Bronchitis    currently   Calculus of kidney 03/25/2015   CHF (congestive heart failure) (HCC)    pt says she thinks she has this   Cirrhosis of liver (HCC) 2016   Depression    Esophageal varices (HCC)    Esophageal varices (HCC)    Gastritis without bleeding 2019   GERD (gastroesophageal reflux disease)    H/O: depression    History of kidney stones     Hx of sinus tachycardia 07/06/2017   Hypertension    Nerve root pain 12/11/2011   Non-alcoholic fatty liver disease    Nonalcoholic fatty liver disease    Osteopenia    Osteopenia    Reflux    in past   Spinal stenosis    Tachycardia 03/25/2015   Tendinopathy of right gluteus medius 01/01/2017   Vertigo    3 yrs ago   Vitamin D deficiency    Vitamin D deficiency     Assessment/Plan:   2 Days Post-Op Procedure(s) (LRB): FIXATION, FRACTURE, INTERTROCHANTERIC, WITH INTRAMEDULLARY ROD (Right) Principal Problem:   Right femoral fracture (HCC) Active Problems:   Anxiety   Acid reflux   Cirrhosis of liver with ascites (HCC)   Esophageal varices in cirrhosis (HCC)   Colonic mass   Weight loss   Protein-calorie malnutrition, severe  Estimated body mass index is 22.07 kg/m as calculated from the following:   Height as of this encounter: 5' 3 (1.6 m).   Weight as of this encounter: 56.5 kg. Advance diet Up with therapy Pain well controlled Vital sign are stable Labs are stable, Hgb 9.0. Continue to monitor recheck labs in the am CM to assist with discharge, likely SNF  Follow up with [facility] in 2 weeks Lovenox  40 mg SQ daily x 14 days at discharge  DVT Prophylaxis - Lovenox , TED hose, and SCDs Weight-Bearing as tolerated to right leg   Krystal Doyne, PA-C [facility] 08/28/2023, 6:42 AM

## 2023-08-28 NOTE — Plan of Care (Addendum)
 Pt remains confused alert to self. Needs frequent reorientation. Bladders scans being completed post void residual. Pt refused in and out cath at beginning of shift. She wanted to have more time. Post void residual did not meet requirement for in and out cath.  Pt requires getting up to bsc to void. Unable to void in bed. MAX assist to get up to bsc. SCD on. BM prior to admission. On colace. Vitals stable. Norco given x 2 and morphine  once. Foam to sacrum. Redness to right abd. Fold.  Problem: Education: Goal: Knowledge of General Education information will improve Description: Including pain rating scale, medication(s)/side effects and non-pharmacologic comfort measures Outcome: Progressing   Problem: Clinical Measurements: Goal: Ability to maintain clinical measurements within normal limits will improve Outcome: Progressing Goal: Will remain free from infection Outcome: Progressing Goal: Diagnostic test results will improve Outcome: Progressing Goal: Respiratory complications will improve Outcome: Progressing Goal: Cardiovascular complication will be avoided Outcome: Progressing   Problem: Nutrition: Goal: Adequate nutrition will be maintained Outcome: Progressing

## 2023-08-28 NOTE — Progress Notes (Signed)
 Physical Therapy Treatment Patient Details Name: Jenna Franklin MRN: [id number] DOB: 1940/04/26 Today's Date: 08/28/2023   History of Present Illness Ms. Tayla Panozzo is an 83 year old female with history of liver cirrhosis with splenomegaly and esophageal varices, anxiety, GERD, who presents emergency department for chief concerns of fall with resulting right hip pain. S/p R IM nailing 08/26/23.    PT Comments  Increased time spent with pt this am to progress mobility and OOB activity. Pt remains very confused, oriented to self only, requiring repeated cues/reminders on situation, place, and time. Pt however able to answer family and lifestyle questions appropriately, short term memory significantly impaired. Therefore increased time to transfer pt to EOB due to fear of increased pain, poor attention to task, and continuous redirection. Pt was able to stand at [facility] with Mod/MaxA several times from bed and BSC, however unable to initiate sequencing or steps despite ability to wt shift onto R LE. Unsuccessful with voiding, pt stood from [facility] and therapist switched out for bedside recliner. Pt positioned to comfort in recliner (first day out of bed) Nursing encouraged to keep pt up and if needed, attempt bladder scan in reclined chair. Unfortunately, nursing put pt back to bed within 30 minutes.   If plan is discharge home, recommend the following: Two people to help with walking and/or transfers;Two people to help with bathing/dressing/bathroom;Assistance with cooking/housework;Assist for transportation;Supervision due to cognitive status;Help with stairs or ramp for entrance   Can travel by private vehicle     No  Equipment Recommendations  Other (comment) (TBD at next level of care)    Recommendations for Other Services       Precautions / Restrictions Precautions Precautions: Fall Recall of Precautions/Restrictions: Impaired Precaution/Restrictions Comments: Poor safety  awareness Restrictions Weight Bearing Restrictions Per Provider Order: Yes RLE Weight Bearing Per Provider Order: Weight bearing as tolerated     Mobility  Bed Mobility Overal bed mobility: Needs Assistance Bed Mobility: Supine to Sit     Supine to sit: Max assist, HOB elevated, Used rails     General bed mobility comments: benefits from constant reassurance, cues for breathing strategies, initiates movement with RLE minimally    Transfers Overall transfer level: Needs assistance Equipment used: Rolling walker (2 wheels) Transfers: Sit to/from Stand Sit to Stand: Max assist, From elevated surface           General transfer comment: Pt unable to coordinate steps while standing at RW, [facility] brought up behind pt and assisted to sitting with ModA.    Ambulation/Gait               General Gait Details: Unable to coordinate steps despite ability to wt shift onto R LE   Stairs             Wheelchair Mobility     Tilt Bed    Modified Rankin (Stroke Patients Only)       Balance Overall balance assessment: Needs assistance Sitting-balance support: Feet supported, Single extremity supported Sitting balance-Leahy Scale: Fair Sitting balance - Comments:  (Pt sat EOB for several minutes with Supervision)   Standing balance support: Bilateral upper extremity supported, Reliant on assistive device for balance, During functional activity Standing balance-Leahy Scale: Poor Standing balance comment: reliant on heavy BUE support                            Communication Communication Communication: No apparent difficulties  Cognition Arousal: Alert Behavior During  Therapy: Anxious   PT - Cognitive impairments: History of cognitive impairments                       PT - Cognition Comments: Constant redirection to current situation Following commands: Impaired Following commands impaired: Follows one step commands with increased time,  Follows one step commands inconsistently    Cueing Cueing Techniques: Verbal cues, Tactile cues, Gestural cues, Visual cues  Exercises General Exercises - Lower Extremity Ankle Circles/Pumps: AAROM, Both, 5 reps Heel Slides: AAROM, Right, 5 reps Hip ABduction/ADduction: AAROM, Right, 5 reps Other Exercises Other Exercises: benefits of PT, WB status, safe use of DME.    General Comments General comments (skin integrity, edema, etc.):  (R LE dressing intact and dry.)      Pertinent Vitals/Pain Pain Assessment Pain Assessment: Faces Faces Pain Scale: Hurts even more Pain Location: R hip Pain Descriptors / Indicators: Moaning, Grimacing, Guarding, Discomfort Pain Intervention(s): Monitored during session    Home Living                          Prior Function            PT Goals (current goals can now be found in the care plan section) Acute Rehab PT Goals Patient Stated Goal: to improve R hip pain    Frequency    7X/week      PT Plan      Co-evaluation              AM-PAC PT 6 Clicks Mobility   Outcome Measure  Help needed turning from your back to your side while in a flat bed without using bedrails?: Total Help needed moving from lying on your back to sitting on the side of a flat bed without using bedrails?: Total Help needed moving to and from a bed to a chair (including a wheelchair)?: Total Help needed standing up from a chair using your arms (e.g., wheelchair or bedside chair)?: A Lot Help needed to walk in hospital room?: Total Help needed climbing 3-5 steps with a railing? : Total 6 Click Score: 7    End of Session Equipment Utilized During Treatment: Gait belt Activity Tolerance: Patient limited by pain;Other (comment) (limited by confusion and cognitive impairments) Patient left: in chair;with call bell/phone within reach;with chair alarm set Nurse Communication: Mobility status;Other (comment) (Stedy for transfers with  nursing) PT Visit Diagnosis: Muscle weakness (generalized) (M62.81);Difficulty in walking, not elsewhere classified (R26.2);Pain Pain - Right/Left: Right Pain - part of body: Hip     Time: 9054-8958 PT Time Calculation (min) (ACUTE ONLY): 56 min  Charges:    $Therapeutic Exercise: 8-22 mins $Therapeutic Activity: 38-52 mins PT General Charges $$ ACUTE PT VISIT: 1 Visit                    Darice Bohr, PTA  Darice JAYSON Bohr 08/28/2023, 1:41 PM

## 2023-08-29 ENCOUNTER — Inpatient Hospital Stay

## 2023-08-29 DIAGNOSIS — S72001A Fracture of unspecified part of neck of right femur, initial encounter for closed fracture: Secondary | ICD-10-CM | POA: Diagnosis not present

## 2023-08-29 LAB — PROTIME-INR
INR: 1.3 — ABNORMAL HIGH (ref 0.8–1.2)
Prothrombin Time: 17.2 s — ABNORMAL HIGH (ref 11.4–15.2)

## 2023-08-29 LAB — APTT: aPTT: 32 s (ref 24–36)

## 2023-08-29 MED ORDER — LABETALOL HCL 5 MG/ML IV SOLN: 10.0000 mg | Freq: Once | INTRAVENOUS | Status: DC

## 2023-08-29 MED ORDER — BISACODYL 10 MG RE SUPP: 10.0000 mg | Freq: Every day | RECTAL | Status: DC | PRN

## 2023-08-29 MED ORDER — HEPARIN (PORCINE) 25000 UT/250ML-% IV SOLN
1000.0000 [IU]/h | INTRAVENOUS | Status: DC
  Administered 2023-08-29 – 2023-08-30 (×2): 900 [IU]/h via INTRAVENOUS
  Administered 2023-08-31 – 2023-09-02 (×2): 1000 [IU]/h via INTRAVENOUS
  Filled 2023-08-29 (×4): qty 250

## 2023-08-29 MED ORDER — NADOLOL 20 MG PO TABS
20.0000 mg | ORAL_TABLET | Freq: Every day | ORAL | Status: DC
  Administered 2023-08-29 – 2023-09-06 (×9): 20 mg via ORAL
  Filled 2023-08-29 (×10): qty 1

## 2023-08-29 MED ORDER — SENNOSIDES-DOCUSATE SODIUM 8.6-50 MG PO TABS
2.0000 | ORAL_TABLET | Freq: Once | ORAL | Status: AC
  Administered 2023-08-29: 2 via ORAL
  Filled 2023-08-29: qty 2

## 2023-08-29 MED ORDER — IOHEXOL 350 MG/ML SOLN
75.0000 mL | Freq: Once | INTRAVENOUS | Status: AC | PRN
  Administered 2023-08-29: 75 mL via INTRAVENOUS

## 2023-08-29 MED ORDER — LABETALOL HCL 5 MG/ML IV SOLN: 5.0000 mg | INTRAVENOUS | Status: DC | PRN

## 2023-08-29 MED ORDER — POLYETHYLENE GLYCOL 3350 17 G PO PACK
17.0000 g | PACK | Freq: Two times a day (BID) | ORAL | Status: DC
  Administered 2023-08-29 – 2023-09-05 (×12): 17 g via ORAL
  Filled 2023-08-29 (×12): qty 1

## 2023-08-29 MED ORDER — HEPARIN BOLUS VIA INFUSION
3400.0000 [IU] | Freq: Once | INTRAVENOUS | Status: AC
  Administered 2023-08-29: 3400 [IU] via INTRAVENOUS
  Filled 2023-08-29: qty 3400

## 2023-08-29 NOTE — Progress Notes (Signed)
 Physical Therapy Treatment Patient Details Name: Jenna Franklin MRN: [id number] DOB: [birth year] Today's Date: 08/29/2023   History of Present Illness [name] is an 83 year old female with history of liver cirrhosis with splenomegaly and esophageal varices, anxiety, GERD, who presents emergency department for chief concerns of fall with resulting right hip pain. S/p R IM nailing 08/26/23.    PT Comments  Pt in bed.  Fearful but does tolerate minimal supine AAROM.  Transition to sitting EOB with bed features and mod a x 1. She is given extra time and cues but does do better than prior sessions.  Steady in sitting with distant supervision.  Herby is used to transfer to recliner at bedside for pt and staff safety as only +1 available for mobility.  She does stand with min a x 1 to steady with max verbal cues and encouragement.  She tolerates transfer well but with some fear and encouragement is provided.  After sitting in recliner AAROM RLE in sitting.  Positioned for comfort after session and tech is encouraged to use Stedy to return to bed.  May need +2 from lower recliner height.  Will need +2 for attempts with RW.     If plan is discharge home, recommend the following: Two people to help with walking and/or transfers;Two people to help with bathing/dressing/bathroom;Assistance with cooking/housework;Assist for transportation;Supervision due to cognitive status;Help with stairs or ramp for entrance   Can travel by private vehicle        Equipment Recommendations       Recommendations for Other Services       Precautions / Restrictions Precautions Precautions: Fall Recall of Precautions/Restrictions: Impaired Precaution/Restrictions Comments: Poor safety awareness Restrictions Weight Bearing Restrictions Per Provider Order: Yes RLE Weight Bearing Per Provider Order: Weight bearing as tolerated     Mobility  Bed Mobility Overal bed mobility: Needs Assistance Bed Mobility:  Supine to Sit     Supine to sit: Max assist, HOB elevated, Used rails       Patient Response: Anxious, Cooperative  Transfers Overall transfer level: Needs assistance   Transfers: Bed to chair/wheelchair/BSC             General transfer comment: +1 for Stedy with constant encouragement, cues and redirection    Ambulation/Gait                   Stairs             Wheelchair Mobility     Tilt Bed Tilt Bed Patient Response: Anxious, Cooperative  Modified Rankin (Stroke Patients Only)       Balance Overall balance assessment: Needs assistance Sitting-balance support: Feet supported, Single extremity supported Sitting balance-Leahy Scale: Fair     Standing balance support: Bilateral upper extremity supported, Reliant on assistive device for balance, During functional activity Standing balance-Leahy Scale: Poor Standing balance comment: does well in stedy                            Communication Communication Communication: No apparent difficulties  Cognition Arousal: Alert Behavior During Therapy: Anxious   PT - Cognitive impairments: History of cognitive impairments                       PT - Cognition Comments: Constant redirection to current situation Following commands: Impaired Following commands impaired: Follows one step commands inconsistently    Cueing Cueing Techniques: Verbal cues, Tactile cues, Gestural  cues, Visual cues  Exercises Other Exercises Other Exercises: supine and seated AAROM    General Comments        Pertinent Vitals/Pain Pain Assessment Pain Assessment: Faces Faces Pain Scale: Hurts even more Pain Location: R hip - some seems painful, some seems fear of movement Pain Descriptors / Indicators: Moaning, Grimacing, Guarding, Discomfort Pain Intervention(s): Limited activity within patient's tolerance, Monitored during session, Repositioned    Home Living                           Prior Function            PT Goals (current goals can now be found in the care plan section) Progress towards PT goals: Progressing toward goals    Frequency    7X/week      PT Plan      Co-evaluation              AM-PAC PT 6 Clicks Mobility   Outcome Measure  Help needed turning from your back to your side while in a flat bed without using bedrails?: Total Help needed moving from lying on your back to sitting on the side of a flat bed without using bedrails?: Total Help needed moving to and from a bed to a chair (including a wheelchair)?: Total Help needed standing up from a chair using your arms (e.g., wheelchair or bedside chair)?: A Lot Help needed to walk in hospital room?: Total Help needed climbing 3-5 steps with a railing? : Total 6 Click Score: 7    End of Session Equipment Utilized During Treatment: Gait belt Activity Tolerance: Patient tolerated treatment well Patient left: in chair;with call bell/phone within reach;with chair alarm set Nurse Communication: Mobility status;Other (comment) PT Visit Diagnosis: Muscle weakness (generalized) (M62.81);Difficulty in walking, not elsewhere classified (R26.2);Pain Pain - Right/Left: Right Pain - part of body: Hip     Time: 0953-1010 PT Time Calculation (min) (ACUTE ONLY): 17 min  Charges:    $Therapeutic Activity: 8-22 mins PT General Charges $$ ACUTE PT VISIT: 1 Visit                   Lauraine Gills, PTA 08/29/23, 11:48 AM

## 2023-08-29 NOTE — Progress Notes (Signed)
 Pt last I&O cath was 1500. Pt has been unable to void and complained of pain/pressure. Current bladder scan shows 274. Visual on scan is unusual and appears as if there is a blockage in bladder or that bladder is split in two. I&O  cath of clear dark yellow urine. Pt c/o pain with movement, remains confused and unaware she is in hospital, had a fracture and surgery.

## 2023-08-29 NOTE — Progress Notes (Addendum)
 Alerted to tachycardia by RN  S EKG showing sinus tach, rate 130s Pt states feeling better denies hip/MSK pain, denies chest pain, denies trouble breathing, denies flutters/palpitations in chest. She said she was having chest pain earlier when asked to describe she says when Chyrl and Inocente were here they were doing palpitations or something which she said right after I was asking her about palpitations. She is still confused.   O BP (!) 141/77 (BP Location: Right Arm)   Pulse (!) 127   Temp 98.5 F (36.9 C) (Oral)   Resp 18   Ht 5' 3 (1.6 m)   Wt 56.5 kg   SpO2 97%   BMI 22.07 kg/m  Tachycardic, regular CTAB No edema LE bilaterally   A/P Sinus tachycardia Concern for rebound tachycardia w/ beta blocker withdrawal, possibly PE - Wells score 4 Given colon mass likely malignancy, hypercoagulable so would work up PE w/ CTA  Restarted home beta blocker, will also give IV dose x1 since BP is not low   Telemetry  Pain control

## 2023-08-29 NOTE — Progress Notes (Signed)
 PROGRESS NOTE    Jenna Franklin   FMW:[id number] DOB: 1941-02-01  DOA: 08/26/2023 Date of Service: 08/29/23 which is hospital day 3  PCP: Justus Leita DEL, MD    Hospital course / significant events:   HPI: [name] is an 83 year old female with history of liver cirrhosis with splenomegaly and esophageal varices, anxiety, GERD, who presents emergency department for chief concerns of fall with resulting right hip pain.  07/10: admitted to hospitalist service. Underwent R hip fracture repair 07/11: PT/OT recs for SNF.  Discussion today with family regarding colon mass and options re further w/u  07/12-07/13: stable, await SNF thru the weekend, pt remains confused.      Consultants:  Orthopedics   Procedures/Surgeries: Right Hip Intramedullary nail repair 08/26/2023 w/ Dr Lorelle      ASSESSMENT & PLAN:   Right femoral fracture Secondary to fall S/p Right Hip Intramedullary nail repair 08/26/2023 w/ Dr Lorelle Orthopedic surgery following Pain control WBAT  Fall precaution PT, OT Follow up with [facility] in 2 weeks Lovenox  40 mg SQ daily x 14 days at discharge  Colonic mass with infiltration into omentum, likely malignancy Pulmonary nodule, malignancy cannot be excluded Weight loss Given CT read of colonic mass with omental implants and small pulmonary nodules, high suspicion for malignancy and metastatic disease cannot be excluded at this time Pt's son and daughter ask to defer conversation w/ patient for now until more lucid, hx memory problems / dementia and family thinks she will not remember or process the conversation right now  Dr Jinny GI spoke 07/11 with patient's daughter, holding off on colonoscopy for now and considering overall frailty and other comorbidities may not be a good candidate for aggressive workup / treatment but would recommend further discussion with oncology team Oncology consult, nonemergent   Cirrhosis complicated by  Esophageal varices Appears compensated at this time and not in acute exacerbation Continue outpatient follow-up with GI specialist as appropriate  Anxiety PDMP reviewed. currently no active prescription outpatient Prn ativan  as needed   Constipation  Bowel regimen per orders  Urinary retention - improved/resolved In-out cath prn but pt has spontaneous void today can hopefully avoid Foley   No concerns based on BMI: Body mass index is 22.07 kg/m.SABRA Significantly low or high BMI is associated with higher medical risk.  Underweight - under 18  overweight - 25 to 29 obese - 30 or more Class 1 obesity: BMI of 30.0 to 34 Class 2 obesity: BMI of 35.0 to 39 Class 3 obesity: BMI of 40.0 to 49 Super Morbid Obesity: BMI 50-59 Super-super Morbid Obesity: BMI 60+ Healthy nutrition and physical activity advised as adjunct to other disease management and risk reduction treatments    DVT prophylaxis: Lovenox  IV fluids: no continuous IV fluids  Nutrition: Regular diet Central lines / other devices: None  Code Status: Full code ACP documentation reviewed:  none on file in [facility]  TOC needs: SNF placement for rehab Medical barriers to dispo: None at this time             Subjective / Brief ROS:  Patient reports no concerns but says she's not fine though she cannot give me an example of what's bothering her  Remains confused  Denies CP/SOB.    Family Communication: will call family later today    Objective Findings:  Vitals:   08/29/23 0426 08/29/23 0510 08/29/23 0600 08/29/23 0732  BP: 126/75   112/73  Pulse: (!) 122 (!) 115 (!) 107 ROLLEN)  110  Resp: 18   17  Temp: 98 F (36.7 C)   98.4 F (36.9 C)  TempSrc: Oral   Oral  SpO2: 99%   97%  Weight:      Height:        Intake/Output Summary (Last 24 hours) at 08/29/2023 1236 Last data filed at 08/29/2023 1130 Gross per 24 hour  Intake 480 ml  Output 1150 ml  Net -670 ml   Filed Weights   08/26/23 1143  Weight:  56.5 kg    Examination:  Physical Exam Constitutional:      General: [name]. Cardiovascular:     Rate and Rhythm: Normal rate and regular rhythm.  Pulmonary:     Effort: Pulmonary effort is normal.     Breath sounds: Normal breath sounds.  Neurological:     General: No focal deficit present.     Mental Status: She is alert. She is disoriented.     Comments: Oriented to place only after she reads the whiteboard, states she remembers who I am but cannot tell me what my job/role is, not oriented to year/month, not oriented to situation.   Psychiatric:        Mood and Affect: Mood normal.        Behavior: Behavior normal.          Scheduled Medications:   Chlorhexidine  Gluconate Cloth  6 each Topical Daily   docusate sodium   100 mg Oral BID   enoxaparin  (LOVENOX ) injection  40 mg Subcutaneous Q24H   feeding supplement  237 mL Oral BID BM   multivitamin with minerals  1 tablet Oral Daily    Continuous Infusions:    PRN Medications:  acetaminophen , HYDROcodone -acetaminophen , LORazepam , menthol -cetylpyridinium **OR** phenol, metoCLOPramide  **OR** metoCLOPramide  (REGLAN ) injection, morphine  injection, ondansetron  **OR** ondansetron  (ZOFRAN ) IV  Antimicrobials from admission:  Anti-infectives (From admission, onward)    Start     Dose/Rate Route Frequency Ordered Stop   08/26/23 2300  ceFAZolin  (ANCEF ) IVPB 2g/100 mL premix        2 g 200 mL/hr over 30 Minutes Intravenous Every 6 hours 08/26/23 2015 08/27/23 1059   08/26/23 1545  ceFAZolin  (ANCEF ) IVPB 2g/100 mL premix        2 g 200 mL/hr over 30 Minutes Intravenous  Once 08/26/23 1544 08/26/23 1628           Data Reviewed:  I have personally reviewed the following...  CBC: Recent Labs  Lab 08/26/23 1146 08/27/23 0456 08/28/23 1045  WBC 6.5 5.7 9.3  HGB 10.6* 9.0* 9.9*  HCT 32.7* 28.1* 31.1*  MCV 90.6 89.8 91.5  PLT 142* 112* 189   Basic Metabolic Panel: Recent Labs  Lab  08/26/23 1146 08/27/23 0456 08/28/23 1045  NA 136 137 133*  K 3.7 3.6 3.7  CL 105 104 100  CO2 20* 22 24  GLUCOSE 219* 190* 200*  BUN 8 9 11   CREATININE 0.64 0.65 0.68  CALCIUM 8.2* 8.3* 8.9   GFR: Estimated Creatinine Clearance: 44.1 mL/min (by C-G formula based on SCr of 0.68 mg/dL). Liver Function Tests: Recent Labs  Lab 08/26/23 1146  AST 40  ALT 17  ALKPHOS 59  BILITOT 1.6*  PROT 6.5  ALBUMIN 2.9*   No results for input(s): LIPASE, AMYLASE in the last 168 hours. Recent Labs  Lab 08/26/23 1242  AMMONIA 23   Coagulation Profile: No results for input(s): INR, PROTIME in the last 168 hours. Cardiac Enzymes: Recent Labs  Lab 08/26/23 1146  CKTOTAL 45   BNP (last 3 results) No results for input(s): PROBNP in the last 8760 hours. HbA1C: No results for input(s): HGBA1C in the last 72 hours. CBG: No results for input(s): GLUCAP in the last 168 hours. Lipid Profile: No results for input(s): CHOL, HDL, LDLCALC, TRIG, CHOLHDL, LDLDIRECT in the last 72 hours. Thyroid  Function Tests: No results for input(s): TSH, T4TOTAL, FREET4, T3FREE, THYROIDAB in the last 72 hours. Anemia Panel: No results for input(s): VITAMINB12, FOLATE, FERRITIN, TIBC, IRON, RETICCTPCT in the last 72 hours. Most Recent Urinalysis On File:     Component Value Date/Time   COLORURINE STRAW (A) 04/21/2019 1455   APPEARANCEUR CLEAR 04/21/2019 1455   LABSPEC 1.010 04/21/2019 1455   PHURINE 6.0 04/21/2019 1455   GLUCOSEU NEGATIVE 04/21/2019 1455   HGBUR NEGATIVE 04/21/2019 1455   BILIRUBINUR neg 09/06/2019 0833   KETONESUR NEGATIVE 04/21/2019 1455   PROTEINUR Negative 09/06/2019 0833   PROTEINUR NEGATIVE 04/21/2019 1455   UROBILINOGEN 0.2 09/06/2019 0833   NITRITE neg 09/06/2019 0833   NITRITE NEGATIVE 04/21/2019 1455   LEUKOCYTESUR Negative 09/06/2019 0833   LEUKOCYTESUR NEGATIVE 04/21/2019 1455   Sepsis  Labs: @LABRCNTIP (procalcitonin:4,lacticidven:4) Microbiology: No results found for this or any previous visit (from the past 240 hours).    Radiology Studies last 3 days: DG HIP UNILAT WITH PELVIS 2-3 VIEWS RIGHT Result Date: 08/26/2023 CLINICAL DATA:  ORIF of the right hip. EXAM: DG HIP (WITH OR WITHOUT PELVIS) 2-3V RIGHT COMPARISON:  Right hip radiograph dated 08/25/2025. FINDINGS: Six intraoperative fluoroscopic spot images provided. The total fluoroscopic time is 1 minute, 22 seconds with a cumulative air Karma of 17.06 mGy. Status post ORIF of the right femur. IMPRESSION: Intraoperative fluoroscopic guidance for ORIF of the right femur. Electronically Signed   By: Vanetta Chou M.D.   On: 08/26/2023 17:20   DG C-Arm 1-60 Min-No Report Result Date: 08/26/2023 Fluoroscopy was utilized by the requesting physician.  No radiographic interpretation.   CT CHEST ABDOMEN PELVIS WO CONTRAST Result Date: 08/26/2023 CLINICAL DATA:  Trauma. EXAM: CT CHEST, ABDOMEN AND PELVIS WITHOUT CONTRAST TECHNIQUE: Multidetector CT imaging of the chest, abdomen and pelvis was performed following the standard protocol without IV contrast. RADIATION DOSE REDUCTION: This exam was performed according to the departmental dose-optimization program which includes automated exposure control, adjustment of the mA and/or kV according to patient size and/or use of iterative reconstruction technique. COMPARISON:  None Available. FINDINGS: Evaluation of this exam is limited in the absence of intravenous contrast. CT CHEST FINDINGS Cardiovascular: There is no cardiomegaly or pericardial effusion. The thoracic aorta and the central pulmonary arteries are grossly unremarkable. There is a left-sided aortic arch with aberrant right subclavian artery anatomy. Mediastinum/Nodes: No hilar or mediastinal adenopathy. Small hiatal hernia. The esophagus is grossly unremarkable. No mediastinal fluid collection. Lungs/Pleura: Minimal bibasilar  subpleural atelectasis. There is a 7 mm right middle lobe and a 4 mm right middle lobe nodules. A 9 mm nodular density at the right lung base along the diaphragm. No consolidative changes. There is no pleural effusion pneumothorax. The central airways are patent. Musculoskeletal: Osteopenia. Old T12 compression fracture. No acute osseous pathology. CT ABDOMEN PELVIS FINDINGS No intra-abdominal free air.  Small ascites. Hepatobiliary: Cirrhosis.  No biliary dilatation.  Cholecystectomy. Pancreas: Unremarkable. No pancreatic ductal dilatation or surrounding inflammatory changes. Spleen: Splenomegaly measuring 15 cm in length. Adrenals/Urinary Tract: The adrenal glands unremarkable. Small right renal upper pole cyst. There is no hydronephrosis or nephrolithiasis on either side. The  visualized ureters and urinary bladder marked. Stomach/Bowel: There is sigmoid diverticulosis. Diffuse thickened appearance of the colon may be related to hepatic colopathy and size. Colitis is not excluded. There is however an area of irregularity and thickening of the colonic wall with nodular protrusion involving the proximal transverse colon (axial 70/4 and coronal 28/7) concerning for malignancy. Further evaluation with colonoscopy recommended. There is no bowel obstruction. The appendix is not well visualized due to ascites. Vascular/Lymphatic: Mild aortoiliac atherosclerotic disease. The IVC is unremarkable. No portal venous gas. No adenopathy. Several small nodules adjacent to the proximal transverse colon as well as small omental nodules in the right upper abdomen (64/4) metastasis. Reproductive: Hysterectomy. Other: Soft tissue nodules in the anterior abdomen and involving the right rectus sheath measure up to 3.8 x 2.0 cm consistent with metastatic implants. Musculoskeletal: Osteopenia with degenerative changes of the spine. Old L1 compression fracture with anterior wedging. There is mildly angulated fracture of the right femoral  neck involving the basicervical and trochanteric region. IMPRESSION: 1. Mildly angulated fracture of the right femoral neck. 2. No acute/traumatic intrathoracic pathology. 3. Cirrhosis with splenomegaly and small ascites. 4. Sigmoid diverticulosis. 5. Findings concerning for colonic mass/malignancy of the proximal transverse colon with omental implants. Further evaluation with colonoscopy is recommended. 6. Small pulmonary nodules, metastatic disease is not excluded. 7.  Aortic Atherosclerosis (ICD10-I70.0). Electronically Signed   By: Vanetta Chou M.D.   On: 08/26/2023 14:58   CT T-SPINE NO CHARGE Result Date: 08/26/2023 CLINICAL DATA:  Trauma. EXAM: CT THORACIC AND LUMBAR SPINE WITHOUT CONTRAST TECHNIQUE: Multidetector CT imaging of the thoracic and lumbar spine was performed without contrast. Multiplanar CT image reconstructions were also generated. RADIATION DOSE REDUCTION: This exam was performed according to the departmental dose-optimization program which includes automated exposure control, adjustment of the mA and/or kV according to patient size and/or use of iterative reconstruction technique. COMPARISON:  Lumbar spine CT dated 08/19/2020. FINDINGS: CT THORACIC SPINE FINDINGS Alignment: No acute subluxation. Vertebrae: No acute fracture. The bones are osteopenic. Old T12 compression fracture with approximately 50% loss of vertebral body height anteriorly and anterior wedging. This is relatively similar to the lumbar spine CT of 08/19/2020. Paraspinal and other soft tissues: Negative. Disc levels: No acute findings with degenerative changes. CT LUMBAR SPINE FINDINGS Segmentation: 5 lumbar type vertebrae. Alignment: No acute subluxation. Vertebrae: Old appearing L1 compression fracture with approximately 50% loss of vertebral body height and anterior wedging, new since the prior CT. No definite acute fracture. The bones are osteopenic. Paraspinal and other soft tissues: Negative. Disc levels:  Degenerative changes. IMPRESSION: 1. No acute/traumatic thoracic or lumbar spine pathology. 2. Old T12 and L1 compression fractures. Electronically Signed   By: Vanetta Chou M.D.   On: 08/26/2023 14:47   CT L-SPINE NO CHARGE Result Date: 08/26/2023 CLINICAL DATA:  Trauma. EXAM: CT THORACIC AND LUMBAR SPINE WITHOUT CONTRAST TECHNIQUE: Multidetector CT imaging of the thoracic and lumbar spine was performed without contrast. Multiplanar CT image reconstructions were also generated. RADIATION DOSE REDUCTION: This exam was performed according to the departmental dose-optimization program which includes automated exposure control, adjustment of the mA and/or kV according to patient size and/or use of iterative reconstruction technique. COMPARISON:  Lumbar spine CT dated 08/19/2020. FINDINGS: CT THORACIC SPINE FINDINGS Alignment: No acute subluxation. Vertebrae: No acute fracture. The bones are osteopenic. Old T12 compression fracture with approximately 50% loss of vertebral body height anteriorly and anterior wedging. This is relatively similar to the lumbar spine CT of 08/19/2020. Paraspinal and  other soft tissues: Negative. Disc levels: No acute findings with degenerative changes. CT LUMBAR SPINE FINDINGS Segmentation: 5 lumbar type vertebrae. Alignment: No acute subluxation. Vertebrae: Old appearing L1 compression fracture with approximately 50% loss of vertebral body height and anterior wedging, new since the prior CT. No definite acute fracture. The bones are osteopenic. Paraspinal and other soft tissues: Negative. Disc levels: Degenerative changes. IMPRESSION: 1. No acute/traumatic thoracic or lumbar spine pathology. 2. Old T12 and L1 compression fractures. Electronically Signed   By: Vanetta Chou M.D.   On: 08/26/2023 14:47   DG Elbow Complete Right Result Date: 08/26/2023 CLINICAL DATA:  Fall. EXAM: RIGHT ELBOW - COMPLETE 3+ VIEW COMPARISON:  08/19/2020. FINDINGS: There is no evidence of fracture,  dislocation, or joint effusion. Mild degenerative spurring of the coronoid process of the ulna. Soft tissues are unremarkable. IMPRESSION: No acute fracture or dislocation of the right elbow. Electronically Signed   By: Harrietta Sherry M.D.   On: 08/26/2023 14:08   DG FEMUR, MIN 2 VIEWS RIGHT Result Date: 08/26/2023 CLINICAL DATA:  Right hip pain after fall. EXAM: RIGHT FEMUR 2 VIEWS COMPARISON:  Same-day pelvic radiographs dated 08/26/2023 at 12:03 p.m. FINDINGS: Diffuse osseous demineralization. Redemonstrated probable basicervical fracture of the right proximal femur. Intertrochanteric extension cannot be excluded. No additional fracture identified. No dislocation. Peripheral vascular calcifications. IMPRESSION: Redemonstrated probable basicervical fracture of the right proximal femur. Intertrochanteric extension cannot be excluded. No additional fracture identified. Electronically Signed   By: Harrietta Sherry M.D.   On: 08/26/2023 14:06   CT Head Wo Contrast Result Date: 08/26/2023 CLINICAL DATA:  Altered mental status.  Fall. EXAM: CT HEAD WITHOUT CONTRAST CT CERVICAL SPINE WITHOUT CONTRAST TECHNIQUE: Multidetector CT imaging of the head and cervical spine was performed following the standard protocol without intravenous contrast. Multiplanar CT image reconstructions of the cervical spine were also generated. RADIATION DOSE REDUCTION: This exam was performed according to the departmental dose-optimization program which includes automated exposure control, adjustment of the mA and/or kV according to patient size and/or use of iterative reconstruction technique. COMPARISON:  None Available. FINDINGS: CT HEAD FINDINGS Brain: No acute intracranial hemorrhage. No focal mass lesion. No CT evidence of acute infarction. No midline shift or mass effect. No hydrocephalus. Basilar cisterns are patent. There are periventricular and subcortical white matter hypodensities. Generalized cortical atrophy. Vascular: No  hyperdense vessel or unexpected calcification. Skull: Normal. Negative for fracture or focal lesion. Sinuses/Orbits: Paranasal sinuses and mastoid air cells are clear. Orbits are clear. Other: None. CT CERVICAL SPINE FINDINGS Alignment: Normal alignment of the cervical vertebral bodies. Skull base and vertebrae: Normal craniocervical junction. No loss of vertebral body height or disc height. Normal facet articulation. No evidence of fracture. Soft tissues and spinal canal: No prevertebral soft tissue swelling. No perispinal or epidural hematoma. Disc levels: Bulky RIGHT facet hypertrophy from C3 to C5. Anterior endplate spurring R4-R2. No acute findings Upper chest: Clear Other: None IMPRESSION: HEAD CT: 1. No acute intracranial findings. 2. Atrophy and white matter microvascular disease. CERVICAL SPINE CT: 1. No cervical spine fracture. 2. Cervical spondylosis. Electronically Signed   By: Jackquline Boxer M.D.   On: 08/26/2023 12:32   CT Cervical Spine Wo Contrast Result Date: 08/26/2023 CLINICAL DATA:  Altered mental status.  Fall. EXAM: CT HEAD WITHOUT CONTRAST CT CERVICAL SPINE WITHOUT CONTRAST TECHNIQUE: Multidetector CT imaging of the head and cervical spine was performed following the standard protocol without intravenous contrast. Multiplanar CT image reconstructions of the cervical spine were also generated. RADIATION  DOSE REDUCTION: This exam was performed according to the departmental dose-optimization program which includes automated exposure control, adjustment of the mA and/or kV according to patient size and/or use of iterative reconstruction technique. COMPARISON:  None Available. FINDINGS: CT HEAD FINDINGS Brain: No acute intracranial hemorrhage. No focal mass lesion. No CT evidence of acute infarction. No midline shift or mass effect. No hydrocephalus. Basilar cisterns are patent. There are periventricular and subcortical white matter hypodensities. Generalized cortical atrophy. Vascular: No  hyperdense vessel or unexpected calcification. Skull: Normal. Negative for fracture or focal lesion. Sinuses/Orbits: Paranasal sinuses and mastoid air cells are clear. Orbits are clear. Other: None. CT CERVICAL SPINE FINDINGS Alignment: Normal alignment of the cervical vertebral bodies. Skull base and vertebrae: Normal craniocervical junction. No loss of vertebral body height or disc height. Normal facet articulation. No evidence of fracture. Soft tissues and spinal canal: No prevertebral soft tissue swelling. No perispinal or epidural hematoma. Disc levels: Bulky RIGHT facet hypertrophy from C3 to C5. Anterior endplate spurring R4-R2. No acute findings Upper chest: Clear Other: None IMPRESSION: HEAD CT: 1. No acute intracranial findings. 2. Atrophy and white matter microvascular disease. CERVICAL SPINE CT: 1. No cervical spine fracture. 2. Cervical spondylosis. Electronically Signed   By: Jackquline Boxer M.D.   On: 08/26/2023 12:32   DG Chest Portable 1 View Result Date: 08/26/2023 CLINICAL DATA:  Fall, right hip fracture EXAM: PORTABLE CHEST 1 VIEW COMPARISON:  08/19/2020 FINDINGS: The patient is rotated to the left on today's radiograph, reducing diagnostic sensitivity and specificity. Low lung volumes are present, causing crowding of the pulmonary vasculature. Age indeterminate deformity of the left sixth rib posterolaterally, correlate with any tenderness in this vicinity in determining acuity. Accounting for the low lung volumes, the lungs appear clear. Cardiac and mediastinal margins appear normal. IMPRESSION: 1. Age indeterminate fracture of the left sixth rib posterolaterally, correlate with any tenderness in this vicinity in determining acuity. 2. Low lung volumes. Electronically Signed   By: Ryan Salvage M.D.   On: 08/26/2023 12:26   DG Hip Unilat With Pelvis 2-3 Views Right Result Date: 08/26/2023 CLINICAL DATA:  Fall, right hip pain EXAM: DG HIP (WITH OR WITHOUT PELVIS) 2-3V RIGHT  COMPARISON:  None Available. FINDINGS: Suboptimal positioning. High suspicion for fracture, either basicervical or intertrochanteric. Intra-articular femoral neck fracture favored but not absolutely certain. Consider CT of the right hip for differentiation. Spurring of both acetabula.  Bony demineralization. IMPRESSION: 1. Right proximal femoral fracture likely basicervical but possibly intertrochanteric. CT could help differentiate. 2. Bony demineralization. 3. Spurring of both acetabula. Electronically Signed   By: Ryan Salvage M.D.   On: 08/26/2023 12:24       Time spent: 50 min     Laneta Blunt, DO [facility] 08/29/2023, 12:36 PM    Dictation software may have been used to generate the above note. Typos may occur and escape review in typed/dictated notes. Please contact Dr Blunt directly for clarity if needed.  Staff may message me via secure chat in Epic  but this may not receive an immediate response,  please page me for urgent matters!  If 7PM-7AM, please contact night coverage [email or web address]

## 2023-08-29 NOTE — Plan of Care (Signed)
  Problem: Education: Goal: Knowledge of General Education information will improve Description: Including pain rating scale, medication(s)/side effects and non-pharmacologic comfort measures Outcome: Progressing   Problem: Health Behavior/Discharge Planning: Goal: Ability to manage health-related needs will improve Outcome: Progressing   Problem: Activity: Goal: Risk for activity intolerance will decrease Outcome: Progressing   Problem: Nutrition: Goal: Adequate nutrition will be maintained Outcome: Progressing   Problem: Coping: Goal: Level of anxiety will decrease Outcome: Progressing   Problem: Pain Managment: Goal: General experience of comfort will improve and/or be controlled Outcome: Progressing   Problem: Safety: Goal: Ability to remain free from injury will improve Outcome: Progressing   Problem: Elimination: Goal: Will not experience complications related to bowel motility Outcome: Progressing Goal: Will not experience complications related to urinary retention Outcome: Progressing

## 2023-08-29 NOTE — Consult Note (Signed)
 Pharmacy Consult Note - Anticoagulation  Pharmacy Consult for heparin  Indication: pulmonary embolus  PATIENT MEASUREMENTS: Height: 5' 3 (160 cm) Weight: 56.5 kg (124 lb 9.6 oz) IBW/kg (Calculated) : 52.4 HEPARIN  DW (KG): 56.5  VITAL SIGNS: Temp: 98.5 F (36.9 C) (07/13 2111) Temp Source: Oral (07/13 1442) BP: 113/59 (07/13 2111) Pulse Rate: 91 (07/13 2111)  Recent Labs    08/28/23 1045  HGB 9.9*  HCT 31.1*  PLT 189  CREATININE 0.68    Estimated Creatinine Clearance: 44.1 mL/min (by C-G formula based on SCr of 0.68 mg/dL).  PAST MEDICAL HISTORY: Past Medical History:  Diagnosis Date   Adjustment disorder with anxious mood 10/05/2013   Anxiety    Arthritis    back   Bronchitis    currently   Calculus of kidney 03/25/2015   CHF (congestive heart failure) (HCC)    pt says she thinks she has this   Cirrhosis of liver (HCC) 2016   Depression    Esophageal varices (HCC)    Esophageal varices (HCC)    Gastritis without bleeding 2019   GERD (gastroesophageal reflux disease)    H/O: depression    History of kidney stones    Hx of sinus tachycardia 07/06/2017   Hypertension    Nerve root pain 12/11/2011   Non-alcoholic fatty liver disease    Nonalcoholic fatty liver disease    Osteopenia    Osteopenia    Reflux    in past   Spinal stenosis    Tachycardia 03/25/2015   Tendinopathy of right gluteus medius 01/01/2017   Vertigo    3 yrs ago   Vitamin D deficiency    Vitamin D deficiency     ASSESSMENT: 83 y.o. female with PMH including cirrhosis with ascites, esophageal varices, colonic mass concerning for malignancy is presenting with right femoral fracture and pulmonary emboli. CT chest demonstrates pulmonary emboli in RLL with evidence for right heart strain. Patient is not on chronic anticoagulation per chart review but has received prophylactic enoxaparin  the past three days. Pharmacy has been consulted to initiate and manage heparin  intravenous  infusion.  Pertinent medications: Enoxaparin  40mg , last administered 7/13 @ 0919  Goal(s) of therapy: Heparin  level 0.3 - 0.7 units/mL Monitor platelets by anticoagulation protocol: Yes   Baseline anticoagulation labs: Recent Labs    08/27/23 0456 08/28/23 1045  HGB 9.0* 9.9*  PLT 112* 189   Baseline aPTT and INR have been ordered    PLAN: Discontinue enoxaparin  Give heparin  3400 units bolus x1; then start heparin  infusion at 900 units/hour. Check heparin  level in 8 hours, then daily once at least two levels are consecutively therapeutic. Monitor CBC daily while on heparin  infusion.   Will M. Lenon, PharmD Clinical Pharmacist 08/29/2023 10:38 PM

## 2023-08-29 NOTE — Progress Notes (Signed)
 Subjective: 3 Days Post-Op Procedure(s) (LRB): FIXATION, FRACTURE, INTERTROCHANTERIC, WITH INTRAMEDULLARY ROD (Right) Patient reports pain as mild.   Patient is well, and has had no acute complaints or problems Denies any CP, SOB, ABD pain. We will continue therapy today.    Objective: Vital signs in last 24 hours: Temp:  [97.8 F (36.6 C)-98.1 F (36.7 C)] 98 F (36.7 C) (07/13 0426) Pulse Rate:  [103-122] 107 (07/13 0600) Resp:  [17-20] 18 (07/13 0426) BP: (108-137)/(63-88) 126/75 (07/13 0426) SpO2:  [97 %-99 %] 99 % (07/13 0426)  Intake/Output from previous day: 07/12 0701 - 07/13 0700 In: 600 [P.O.:600] Out: 900 [Urine:900] Intake/Output this shift: Total I/O In: 120 [P.O.:120] Out: 600 [Urine:600]  Recent Labs    08/26/23 1146 08/27/23 0456 08/28/23 1045  HGB 10.6* 9.0* 9.9*   Recent Labs    08/27/23 0456 08/28/23 1045  WBC 5.7 9.3  RBC 3.13* 3.40*  HCT 28.1* 31.1*  PLT 112* 189   Recent Labs    08/27/23 0456 08/28/23 1045  NA 137 133*  K 3.6 3.7  CL 104 100  CO2 22 24  BUN 9 11  CREATININE 0.65 0.68  GLUCOSE 190* 200*  CALCIUM 8.3* 8.9   No results for input(s): LABPT, INR in the last 72 hours.  EXAM General - Patient is Alert, Appropriate, and Confused Extremity - Neurovascular intact Sensation intact distally Intact pulses distally Dorsiflexion/Plantar flexion intact Dressing - dressing C/D/I and no drainage Motor Function - intact, moving foot and toes well on exam.   Past Medical History:  Diagnosis Date   Adjustment disorder with anxious mood 10/05/2013   Anxiety    Arthritis    back   Bronchitis    currently   Calculus of kidney 03/25/2015   CHF (congestive heart failure) (HCC)    pt says she thinks she has this   Cirrhosis of liver (HCC) 2016   Depression    Esophageal varices (HCC)    Esophageal varices (HCC)    Gastritis without bleeding 2019   GERD (gastroesophageal reflux disease)    H/O: depression     History of kidney stones    Hx of sinus tachycardia 07/06/2017   Hypertension    Nerve root pain 12/11/2011   Non-alcoholic fatty liver disease    Nonalcoholic fatty liver disease    Osteopenia    Osteopenia    Reflux    in past   Spinal stenosis    Tachycardia 03/25/2015   Tendinopathy of right gluteus medius 01/01/2017   Vertigo    3 yrs ago   Vitamin D deficiency    Vitamin D deficiency     Assessment/Plan:   3 Days Post-Op Procedure(s) (LRB): FIXATION, FRACTURE, INTERTROCHANTERIC, WITH INTRAMEDULLARY ROD (Right) Principal Problem:   Right femoral fracture (HCC) Active Problems:   Anxiety   Acid reflux   Cirrhosis of liver with ascites (HCC)   Esophageal varices in cirrhosis (HCC)   Colonic mass   Weight loss   Protein-calorie malnutrition, severe  Estimated body mass index is 22.07 kg/m as calculated from the following:   Height as of this encounter: 5' 3 (1.6 m).   Weight as of this encounter: 56.5 kg. Advance diet Up with therapy Pain well controlled Vital sign are stable Labs are stable, Hgb 9.9. Continue to monitor recheck labs in the am CM to assist with discharge, likely SNF  Follow up with [facility] in 2 weeks Lovenox  40 mg SQ daily x 14 days at  discharge   DVT Prophylaxis - Lovenox , TED hose, and SCDs Weight-Bearing as tolerated to right leg   Krystal Doyne, PA-C [facility] 08/29/2023, 6:38 AM

## 2023-08-30 ENCOUNTER — Telehealth (HOSPITAL_COMMUNITY): Payer: Self-pay | Admitting: Pharmacy Technician

## 2023-08-30 ENCOUNTER — Encounter: Payer: Self-pay | Admitting: Orthopedic Surgery

## 2023-08-30 ENCOUNTER — Other Ambulatory Visit (HOSPITAL_COMMUNITY): Payer: Self-pay

## 2023-08-30 ENCOUNTER — Inpatient Hospital Stay
Admit: 2023-08-30 | Discharge: 2023-08-30 | Disposition: A | Discharge status: Home / Self Care | Attending: Family Medicine

## 2023-08-30 DIAGNOSIS — K7460 Unspecified cirrhosis of liver: Secondary | ICD-10-CM | POA: Diagnosis not present

## 2023-08-30 DIAGNOSIS — K6389 Other specified diseases of intestine: Secondary | ICD-10-CM | POA: Diagnosis not present

## 2023-08-30 DIAGNOSIS — S72001A Fracture of unspecified part of neck of right femur, initial encounter for closed fracture: Secondary | ICD-10-CM | POA: Diagnosis not present

## 2023-08-30 DIAGNOSIS — R634 Abnormal weight loss: Secondary | ICD-10-CM

## 2023-08-30 DIAGNOSIS — R188 Other ascites: Secondary | ICD-10-CM

## 2023-08-30 DIAGNOSIS — K7682 Hepatic encephalopathy: Secondary | ICD-10-CM

## 2023-08-30 LAB — COMPREHENSIVE METABOLIC PANEL WITH GFR
ALT: 18 U/L (ref 0–44)
AST: 49 U/L — ABNORMAL HIGH (ref 15–41)
Albumin: 2.7 g/dL — ABNORMAL LOW (ref 3.5–5.0)
Alkaline Phosphatase: 70 U/L (ref 38–126)
Anion gap: 7 (ref 5–15)
BUN: 12 mg/dL (ref 8–23)
CO2: 22 mmol/L (ref 22–32)
Calcium: 8.3 mg/dL — ABNORMAL LOW (ref 8.9–10.3)
Chloride: 105 mmol/L (ref 98–111)
Creatinine, Ser: 0.58 mg/dL (ref 0.44–1.00)
GFR, Estimated: >60 mL/min (ref >60)
Glucose, Bld: 215 mg/dL — ABNORMAL HIGH (ref 70–99)
Potassium: 4 mmol/L (ref 3.5–5.1)
Sodium: 134 mmol/L — ABNORMAL LOW (ref 135–145)
Total Bilirubin: 1.1 mg/dL (ref 0.0–1.2)
Total Protein: 6.2 g/dL — ABNORMAL LOW (ref 6.5–8.1)

## 2023-08-30 LAB — HEPARIN LEVEL (UNFRACTIONATED)
Heparin Unfractionated: 0.43 [IU]/mL (ref 0.30–0.70)
Heparin Unfractionated: 0.32 [IU]/mL (ref 0.30–0.70)

## 2023-08-30 LAB — AMMONIA: Ammonia: 86 umol/L — ABNORMAL HIGH (ref 9–35)

## 2023-08-30 LAB — CBC
HCT: 27.1 % — ABNORMAL LOW (ref 36.0–46.0)
Hemoglobin: 8.9 g/dL — ABNORMAL LOW (ref 12.0–15.0)
MCH: 29 pg (ref 26.0–34.0)
MCHC: 32.8 g/dL (ref 30.0–36.0)
MCV: 88.3 fL (ref 80.0–100.0)
Platelets: 141 K/uL — ABNORMAL LOW (ref 150–400)
RBC: 3.07 MIL/uL — ABNORMAL LOW (ref 3.87–5.11)
RDW: 16.4 % — ABNORMAL HIGH (ref 11.5–15.5)
WBC: 3.7 K/uL — ABNORMAL LOW (ref 4.0–10.5)
nRBC: 0 % (ref 0.0–0.2)

## 2023-08-30 MED ORDER — LACTULOSE 10 GM/15ML PO SOLN
20.0000 g | Freq: Two times a day (BID) | ORAL | Status: DC
  Administered 2023-08-30 – 2023-09-06 (×12): 20 g via ORAL
  Filled 2023-08-30 (×12): qty 30

## 2023-08-30 NOTE — Plan of Care (Signed)
  Problem: Education: Goal: Knowledge of General Education information will improve Description: Including pain rating scale, medication(s)/side effects and non-pharmacologic comfort measures Outcome: Progressing   Problem: Health Behavior/Discharge Planning: Goal: Ability to manage health-related needs will improve Outcome: Progressing   Problem: Activity: Goal: Risk for activity intolerance will decrease Outcome: Progressing   Problem: Coping: Goal: Level of anxiety will decrease Outcome: Progressing   Problem: Elimination: Goal: Will not experience complications related to bowel motility Outcome: Progressing Goal: Will not experience complications related to urinary retention Outcome: Progressing   Problem: Pain Managment: Goal: General experience of comfort will improve and/or be controlled Outcome: Progressing   Problem: Safety: Goal: Ability to remain free from injury will improve Outcome: Progressing   Problem: Skin Integrity: Goal: Risk for impaired skin integrity will decrease Outcome: Progressing

## 2023-08-30 NOTE — Progress Notes (Signed)
 PT Cancellation Note  Patient Details Name: Jenna Franklin MRN: [id number] DOB: April 06, 1940   Cancelled Treatment:    Reason Eval/Treat Not Completed: Medical issues which prohibited therapy  New PE noted.  Will hold session and continue as appropriate.   Lauraine Gills 08/30/2023, 8:07 AM

## 2023-08-30 NOTE — Care Management Important Message (Signed)
 Important Message  Patient Details  Name: Jenna Franklin MRN: [id number] Date of Birth: 1940/11/06   Important Message Given:  Yes - Medicare IM     Duward Allbritton W, CMA 08/30/2023, 2:49 PM

## 2023-08-30 NOTE — IPAL (Signed)
  Interdisciplinary Goals of Care Family Meeting   Date carried out: 08/30/2023  Location of the meeting: Unit  Member's involved: Physician, Family Member or next of kin, and Other: Dr Babara, oncology  Durable Power of Attorney or acting medical decision maker: Patient's daughter Niels    Discussion: We discussed goals of care for [employer] .  Complicated case given likely metastatic cancer and advancing cirrhosis / liver disease.   Mass/cancer - biopsy risky given potential for bleeding. Poor candidate for chemo/radiation/surgery given bleed risk and liver disease  Cirrhosis - appears likely advancing, ammonia levels up, ascites worsening.   Discussed options for palliative treatment. Discussed likely/potential complications which would eventually result in death including but not limited to bowel obstruction, peritonitis, liver failure, bleed risk, clot risk, infection - all of these could manage pain and other symptoms if/when they arise. If/when actively dying, would stop all treatments not directly related to symptom control.   Patient at this time does not have capacity to participate in these discussions. She does not remember conversations from earlier today, she is not oriented.  Started lactulose , might help her mentation but there is risk of persistent encephalopathy.   At this point, daughter is considering forego cancer workup. OK to continue anticoagulation, treating liver failure, await echo, consider paracentesis for comfort, antibiotics if needed. Goal for now see if pt can gain some lucidity to participate in discussion / make her wishes known, but for now daughter is acting with patient's best interests in mind and is thinking of what the patient would want for herself.     Code status:   Code Status: Full Code   Disposition: Continue current acute care  Time spent for the meeting: 40 min    Laneta Blunt, DO  08/30/2023, 5:22 PM

## 2023-08-30 NOTE — Consult Note (Signed)
 Hematology/Oncology Consult note [phone] [phone]      Patient Care Team: Justus Leita DEL, MD as PCP - General (Internal Medicine) Jinny Carmine, MD as Consulting Physician (Gastroenterology) Edda Mt, MD as Consulting Physician (Otolaryngology) Myrna Adine Anes, MD as Consulting Physician (Ophthalmology) Dodson Delon FERNS, MD as Referring Physician (Physical Medicine and Rehabilitation)   Name of the patient: Jenna Franklin  [id number]  August 01, 1940   REASON FOR COSULTATION:  Colon mass History of presenting illness-  83 y.o. female with PMH listed at below who was initially hospitalized after fall resulting right hip pain. Workup showed right femoral fracture patient is status post repair. There is also concern of unintentional weight loss. 08/26/2023, CT chest abdomen pelvis without contrast showed 1. Mildly angulated fracture of the right femoral neck. 2. No acute/traumatic intrathoracic pathology. 3. Cirrhosis with splenomegaly and small ascites. 4. Sigmoid diverticulosis. 5. Findings concerning for colonic mass/malignancy of the proximal transverse colon with omental implants. Further evaluation with colonoscopy is recommended. 6. Small pulmonary nodules, metastatic disease is not excluded. 7.  Aortic Atherosclerosis (ICD10-I70.0)   Patient was on subcutaneous Lovenox  for thrombosis prophylaxis after hip surgery.  Patient's mental status remains confused after surgery.  She is also found to be persistently tachycardic. 08/29/2023, CT chest angiogram PE protocol showed 1. Segmental and subsegmental pulmonary emboli in the right lower lobe. There is evidence for right heart strain (RV/LV Ratio = 1.2) consistent with at least submassive (intermediate risk) PE. The presence of right heart strain has been associated with an increased risk of morbidity and mortality. 2. Trace bilateral pleural effusions with small amount of atelectasis in  the lower lobes. 3. Stable 7 mm right middle lobe nodule. 2 mm left lower lobe nodule. 4. Cirrhotic liver with splenomegaly and moderate ascites in the upper abdomen.  Patient was started on IV heparin  for anticoagulation.  Hematology oncology was consulted for further evaluation.  Patient was seen at the bedside.  She is confused and not able to provide any meaningful history.  History was obtained from daughter [name]. Patient has baseline forgetfulness, questionable dementia.  Daughter has noticed patient's memory is gets worse and since the surgery, patient remains very confused.  Allergies  Allergen Reactions   Baclofen  Anxiety   Codeine Nausea And Vomiting   Sulfa Antibiotics Nausea Only   Prednisone  Anxiety    Patient Active Problem List   Diagnosis Date Noted   Protein-calorie malnutrition, severe 08/27/2023   Right femoral fracture (HCC) 08/26/2023   Colonic mass 08/26/2023   Weight loss 08/26/2023   TMJ arthralgia 05/26/2019   Lumbar spinal stenosis 07/27/2018   Thoracic compression fracture, sequela 04/01/2018   Esophageal varices in cirrhosis (HCC)    Cirrhosis of liver with ascites (HCC) 05/29/2015   Post-menopausal atrophic vaginitis 05/29/2015   Anxiety 03/25/2015   Acid reflux 03/25/2015   Pre-diabetes 09/20/2014   Bursitis, trochanteric 12/11/2011     Past Medical History:  Diagnosis Date   Adjustment disorder with anxious mood 10/05/2013   Anxiety    Arthritis    back   Bronchitis    currently   Calculus of kidney 03/25/2015   CHF (congestive heart failure) (HCC)    pt says she thinks she has this   Cirrhosis of liver (HCC) 2016   Depression    Esophageal varices (HCC)    Esophageal varices (HCC)    Gastritis without bleeding 2019   GERD (gastroesophageal reflux disease)    H/O: depression    History of  kidney stones    Hx of sinus tachycardia 07/06/2017   Hypertension    Nerve root pain 12/11/2011   Non-alcoholic fatty liver disease     Nonalcoholic fatty liver disease    Osteopenia    Osteopenia    Reflux    in past   Spinal stenosis    Tachycardia 03/25/2015   Tendinopathy of right gluteus medius 01/01/2017   Vertigo    3 yrs ago   Vitamin D deficiency    Vitamin D deficiency      Past Surgical History:  Procedure Laterality Date   ABDOMINAL HYSTERECTOMY     APPENDECTOMY     BREAST BIOPSY Right    neg needle bx   CATARACT EXTRACTION W/PHACO Right 01/09/2019   Procedure: CATARACT EXTRACTION PHACO AND INTRAOCULAR LENS PLACEMENT (IOC) RIGHT 8.60,        00:53.3;  Surgeon: Myrna Adine Anes, MD;  Location: [facility];  Service: Ophthalmology;  Laterality: Right;   CATARACT EXTRACTION W/PHACO Left 01/30/2019   Procedure: CATARACT EXTRACTION PHACO AND INTRAOCULAR LENS PLACEMENT (IOC) LEFT, 2.59, 00:25.5;  Surgeon: Myrna Adine Anes, MD;  Location: [facility];  Service: Ophthalmology;  Laterality: Left;   COMBINED HYSTERECTOMY ABDOMINAL W/ A&P REPAIR / OOPHORECTOMY     ESOPHAGOGASTRODUODENOSCOPY (EGD) WITH PROPOFOL  N/A 06/17/2015   Procedure: ESOPHAGOGASTRODUODENOSCOPY (EGD) WITH PROPOFOL ;  Surgeon: Rogelia Copping, MD;  Location: [facility];  Service: Endoscopy;  Laterality: N/A;   ESOPHAGOGASTRODUODENOSCOPY (EGD) WITH PROPOFOL  N/A 06/17/2017   Procedure: ESOPHAGOGASTRODUODENOSCOPY (EGD) WITH PROPOFOL ;  Surgeon: Copping Rogelia, MD;  Location: [facility];  Service: Endoscopy;  Laterality: N/A;   INTRAMEDULLARY (IM) NAIL INTERTROCHANTERIC Right 08/26/2023   Procedure: FIXATION, FRACTURE, INTERTROCHANTERIC, WITH INTRAMEDULLARY ROD;  Surgeon: Lorelle Hussar, MD;  Location: [facility];  Service: Orthopedics;  Laterality: Right;   LAPAROSCOPIC CHOLECYSTECTOMY     TONSILLECTOMY      Social History   Socioeconomic History   Marital status: Widowed    Spouse name: Not on file   Number of children: Not on file   Years of education: Not on file   Highest education level: Not on file   Occupational History   Not on file  Tobacco Use   Smoking status: Never   Smokeless tobacco: Never   Tobacco comments:    smoking cessation materials not required  Vaping Use   Vaping status: Never Used  Substance and Sexual Activity   Alcohol use: No    Alcohol/week: 0.0 standard drinks of alcohol   Drug use: No   Sexual activity: Yes    Birth control/protection: Post-menopausal  Other Topics Concern   Not on file  Social History Narrative   Pt lives alone   Social Drivers of Health   Financial Resource Strain: Low Risk  (08/12/2022)   Overall Financial Resource Strain (CARDIA)    Difficulty of Paying Living Expenses: Not hard at all  Food Insecurity: No Food Insecurity (08/26/2023)   Hunger Vital Sign    Worried About Running Out of Food in the Last Year: Never true    Ran Out of Food in the Last Year: Never true  Transportation Needs: No Transportation Needs (08/26/2023)   PRAPARE - Administrator, Civil Service (Medical): No    Lack of Transportation (Non-Medical): No  Physical Activity: Inactive (08/12/2022)   Exercise Vital Sign    Days of Exercise per Week: 0 days    Minutes of Exercise per Session: 0 min  Stress: No Stress  Concern Present (08/12/2022)   [employer] of Occupational Health - Occupational Stress Questionnaire    Feeling of Stress : Only a little  Social Connections: Socially Isolated (08/26/2023)   Social Connection and Isolation Panel    Frequency of Communication with Friends and Family: More than three times a week    Frequency of Social Gatherings with Friends and Family: More than three times a week    Attends Religious Services: Never    Database administrator or Organizations: No    Attends Banker Meetings: Never    Marital Status: Widowed  Intimate Partner Violence: Not At Risk (08/26/2023)   Humiliation, Afraid, Rape, and Kick questionnaire    Fear of Current or Ex-Partner: No    Emotionally Abused: No     Physically Abused: No    Sexually Abused: No     Family History  Problem Relation Age of Onset   Heart disease Mother    Heart disease Father    Heart attack Paternal Grandfather    Asthma Son 54   Hypertension Daughter    Breast cancer Maternal Aunt      Current Facility-Administered Medications:    acetaminophen  (TYLENOL ) tablet 325-650 mg, 325-650 mg, Oral, Q6H PRN, Aberman, Zachary, MD, 650 mg at 08/28/23 2155   bisacodyl  (DULCOLAX) suppository 10 mg, 10 mg, Rectal, Daily PRN, Marsa Edelman, DO   Chlorhexidine  Gluconate Cloth 2 % PADS 6 each, 6 each, Topical, Daily, Marsa Edelman, DO, 6 each at 08/30/23 9047   docusate sodium  (COLACE) capsule 100 mg, 100 mg, Oral, BID, Aberman, Zachary, MD, 100 mg at 08/30/23 0950   feeding supplement (ENSURE PLUS HIGH PROTEIN) liquid 237 mL, 237 mL, Oral, BID BM, Alexander, Natalie, DO, 237 mL at 08/30/23 1353   heparin  ADULT infusion 100 units/mL (25000 units/250mL), 900 Units/hr, Intravenous, Continuous, Lenon Elsie HERO, [facility], Last Rate: 9 mL/hr at 08/30/23 1913, 900 Units/hr at 08/30/23 1913   HYDROcodone -acetaminophen  (NORCO/VICODIN) 5-325 MG per tablet 1 tablet, 1 tablet, Oral, Q6H PRN, Marsa Edelman, DO, 1 tablet at 08/29/23 1505   labetalol  (NORMODYNE ) injection 5 mg, 5 mg, Intravenous, Q2H PRN, Marsa Edelman, DO   lactulose  (CHRONULAC ) 10 GM/15ML solution 20 g, 20 g, Oral, BID, Alexander, Natalie, DO   LORazepam  (ATIVAN ) injection 0.5 mg, 0.5 mg, Intravenous, Q6H PRN, Alexander, Natalie, DO   menthol -cetylpyridinium (CEPACOL) lozenge 3 mg, 1 lozenge, Oral, PRN **OR** phenol (CHLORASEPTIC) mouth spray 1 spray, 1 spray, Mouth/Throat, PRN, Aberman, Zachary, MD   metoCLOPramide  (REGLAN ) tablet 5-10 mg, 5-10 mg, Oral, Q8H PRN **OR** metoCLOPramide  (REGLAN ) injection 5-10 mg, 5-10 mg, Intravenous, Q8H PRN, Aberman, Zachary, MD   morphine  (PF) 2 MG/ML injection 0.5-1 mg, 0.5-1 mg, Intravenous, Q2H PRN, Aberman, Zachary, MD, 1  mg at 08/29/23 2112   multivitamin with minerals tablet 1 tablet, 1 tablet, Oral, Daily, Alexander, Natalie, DO, 1 tablet at 08/30/23 9048   nadolol  (CORGARD ) tablet 20 mg, 20 mg, Oral, Daily, Alexander, Natalie, DO, 20 mg at 08/30/23 9048   ondansetron  (ZOFRAN ) tablet 4 mg, 4 mg, Oral, Q6H PRN **OR** ondansetron  (ZOFRAN ) injection 4 mg, 4 mg, Intravenous, Q6H PRN, Aberman, Zachary, MD   polyethylene glycol (MIRALAX  / GLYCOLAX ) packet 17 g, 17 g, Oral, BID, Alexander, Natalie, DO, 17 g at 08/30/23 9049  Review of Systems  Unable to perform ROS: Mental status change  Constitutional:  Positive for fatigue.  Gastrointestinal:        Dark stool    PHYSICAL EXAM Vitals:   08/30/23  9941 08/30/23 0328 08/30/23 0754 08/30/23 1423  BP: 118/63 121/78 (!) 105/58 (!) 122/54  Pulse: 86 86 82 82  Resp: 20 16 16 18   Temp: 97.6 F (36.4 C) 98 F (36.7 C) 99 F (37.2 C) (!) 97.3 F (36.3 C)  TempSrc: Oral Oral    SpO2: 97% 97% 95% 99%  Weight:      Height:       Physical Exam HENT:     Head: Normocephalic and atraumatic.  Eyes:     General: No scleral icterus. Cardiovascular:     Rate and Rhythm: Normal rate.     Heart sounds: No murmur heard. Pulmonary:     Effort: Pulmonary effort is normal. No respiratory distress.     Breath sounds: Normal breath sounds.  Abdominal:     General: There is distension.  Musculoskeletal:        General: Normal range of motion.     Cervical back: Normal range of motion and neck supple.  Skin:    General: Skin is dry.     Findings: No erythema.  Neurological:     Motor: No abnormal muscle tone.     Comments: Confused  Psychiatric:        Mood and Affect: Affect normal.       LABORATORY STUDIES    Latest Ref Rng & Units 08/30/2023   10:25 AM 08/28/2023   10:45 AM 08/27/2023    4:56 AM  CBC  WBC 4.0 - 10.5 K/uL 3.7  9.3  5.7   Hemoglobin 12.0 - 15.0 g/dL 8.9  9.9  9.0   Hematocrit 36.0 - 46.0 % 27.1  31.1  28.1   Platelets 150 - 400 K/uL  141  189  112       Latest Ref Rng & Units 08/30/2023   10:25 AM 08/28/2023   10:45 AM 08/27/2023    4:56 AM  CMP  Glucose 70 - 99 mg/dL 784  799  809   BUN 8 - 23 mg/dL 12  11  9    Creatinine 0.44 - 1.00 mg/dL 9.41  9.31  9.34   Sodium 135 - 145 mmol/L 134  133  137   Potassium 3.5 - 5.1 mmol/L 4.0  3.7  3.6   Chloride 98 - 111 mmol/L 105  100  104   CO2 22 - 32 mmol/L 22  24  22    Calcium 8.9 - 10.3 mg/dL 8.3  8.9  8.3   Total Protein 6.5 - 8.1 g/dL 6.2     Total Bilirubin 0.0 - 1.2 mg/dL 1.1     Alkaline Phos 38 - 126 U/L 70     AST 15 - 41 U/L 49     ALT 0 - 44 U/L 18        RADIOGRAPHIC STUDIES: I have personally reviewed the radiological images as listed and agreed with the findings in the report. CT Angio Chest Pulmonary Embolism (PE) W or WO Contrast Addendum Date: 08/29/2023 ADDENDUM REPORT: 08/29/2023 23:09 ADDENDUM: These results were called by telephone at the time of interpretation on 08/29/2023 at 10:11 p.m. to provider Dr. JINNY Peaches, who verbally acknowledged these results. Electronically Signed   By: Greig Pique M.D.   On: 08/29/2023 23:09   Result Date: 08/29/2023 CLINICAL DATA:  High probability for PE EXAM: CT ANGIOGRAPHY CHEST WITH CONTRAST TECHNIQUE: Multidetector CT imaging of the chest was performed using the standard protocol during bolus administration of intravenous contrast. Multiplanar CT image reconstructions  and MIPs were obtained to evaluate the vascular anatomy. RADIATION DOSE REDUCTION: This exam was performed according to the departmental dose-optimization program which includes automated exposure control, adjustment of the mA and/or kV according to patient size and/or use of iterative reconstruction technique. CONTRAST:  75mL OMNIPAQUE  IOHEXOL  350 MG/ML SOLN COMPARISON:  CT of the chest abdomen and pelvis 08/26/2023. the FINDINGS: Cardiovascular: There are segmental and subsegmental pulmonary emboli in the right lower lobe. No other pulmonary emboli are  seen. The heart and aorta are normal in size. There is no pericardial effusion. There are atherosclerotic calcifications of the aorta. There is an aberrant right subclavian artery. Mediastinum/Nodes: No enlarged mediastinal, hilar, or axillary lymph nodes. Thyroid  gland, trachea, and esophagus demonstrate no significant findings. Lungs/Pleura: There are trace bilateral pleural effusions with a small amount of atelectasis in the bilateral lower lobes. There is a 2 mm nodule in the superior segment of the left lower lobe image 6/40. There is a stable right middle lobe nodule measuring 7 mm. The lungs are otherwise clear. Upper Abdomen: There is heterogeneous nodular liver contour. The spleen is enlarged. There is a moderate amount of ascites in the upper abdomen. Musculoskeletal: No acute fractures are identified. T12 chronic compression deformity is unchanged. Review of the MIP images confirms the above findings. IMPRESSION: 1. Segmental and subsegmental pulmonary emboli in the right lower lobe. There is evidence for right heart strain (RV/LV Ratio = 1.2) consistent with at least submassive (intermediate risk) PE. The presence of right heart strain has been associated with an increased risk of morbidity and mortality. 2. Trace bilateral pleural effusions with small amount of atelectasis in the lower lobes. 3. Stable 7 mm right middle lobe nodule. 2 mm left lower lobe nodule. 4. Cirrhotic liver with splenomegaly and moderate ascites in the upper abdomen. Aortic Atherosclerosis (ICD10-I70.0). Electronically Signed: By: Greig Pique M.D. On: 08/29/2023 22:02   DG HIP UNILAT WITH PELVIS 2-3 VIEWS RIGHT Result Date: 08/26/2023 CLINICAL DATA:  ORIF of the right hip. EXAM: DG HIP (WITH OR WITHOUT PELVIS) 2-3V RIGHT COMPARISON:  Right hip radiograph dated 08/25/2025. FINDINGS: Six intraoperative fluoroscopic spot images provided. The total fluoroscopic time is 1 minute, 22 seconds with a cumulative air Karma of 17.06  mGy. Status post ORIF of the right femur. IMPRESSION: Intraoperative fluoroscopic guidance for ORIF of the right femur. Electronically Signed   By: Vanetta Chou M.D.   On: 08/26/2023 17:20   DG C-Arm 1-60 Min-No Report Result Date: 08/26/2023 Fluoroscopy was utilized by the requesting physician.  No radiographic interpretation.   CT CHEST ABDOMEN PELVIS WO CONTRAST Result Date: 08/26/2023 CLINICAL DATA:  Trauma. EXAM: CT CHEST, ABDOMEN AND PELVIS WITHOUT CONTRAST TECHNIQUE: Multidetector CT imaging of the chest, abdomen and pelvis was performed following the standard protocol without IV contrast. RADIATION DOSE REDUCTION: This exam was performed according to the departmental dose-optimization program which includes automated exposure control, adjustment of the mA and/or kV according to patient size and/or use of iterative reconstruction technique. COMPARISON:  None Available. FINDINGS: Evaluation of this exam is limited in the absence of intravenous contrast. CT CHEST FINDINGS Cardiovascular: There is no cardiomegaly or pericardial effusion. The thoracic aorta and the central pulmonary arteries are grossly unremarkable. There is a left-sided aortic arch with aberrant right subclavian artery anatomy. Mediastinum/Nodes: No hilar or mediastinal adenopathy. Small hiatal hernia. The esophagus is grossly unremarkable. No mediastinal fluid collection. Lungs/Pleura: Minimal bibasilar subpleural atelectasis. There is a 7 mm right middle lobe and a 4 mm  right middle lobe nodules. A 9 mm nodular density at the right lung base along the diaphragm. No consolidative changes. There is no pleural effusion pneumothorax. The central airways are patent. Musculoskeletal: Osteopenia. Old T12 compression fracture. No acute osseous pathology. CT ABDOMEN PELVIS FINDINGS No intra-abdominal free air.  Small ascites. Hepatobiliary: Cirrhosis.  No biliary dilatation.  Cholecystectomy. Pancreas: Unremarkable. No pancreatic ductal  dilatation or surrounding inflammatory changes. Spleen: Splenomegaly measuring 15 cm in length. Adrenals/Urinary Tract: The adrenal glands unremarkable. Small right renal upper pole cyst. There is no hydronephrosis or nephrolithiasis on either side. The visualized ureters and urinary bladder marked. Stomach/Bowel: There is sigmoid diverticulosis. Diffuse thickened appearance of the colon may be related to hepatic colopathy and size. Colitis is not excluded. There is however an area of irregularity and thickening of the colonic wall with nodular protrusion involving the proximal transverse colon (axial 70/4 and coronal 28/7) concerning for malignancy. Further evaluation with colonoscopy recommended. There is no bowel obstruction. The appendix is not well visualized due to ascites. Vascular/Lymphatic: Mild aortoiliac atherosclerotic disease. The IVC is unremarkable. No portal venous gas. No adenopathy. Several small nodules adjacent to the proximal transverse colon as well as small omental nodules in the right upper abdomen (64/4) metastasis. Reproductive: Hysterectomy. Other: Soft tissue nodules in the anterior abdomen and involving the right rectus sheath measure up to 3.8 x 2.0 cm consistent with metastatic implants. Musculoskeletal: Osteopenia with degenerative changes of the spine. Old L1 compression fracture with anterior wedging. There is mildly angulated fracture of the right femoral neck involving the basicervical and trochanteric region. IMPRESSION: 1. Mildly angulated fracture of the right femoral neck. 2. No acute/traumatic intrathoracic pathology. 3. Cirrhosis with splenomegaly and small ascites. 4. Sigmoid diverticulosis. 5. Findings concerning for colonic mass/malignancy of the proximal transverse colon with omental implants. Further evaluation with colonoscopy is recommended. 6. Small pulmonary nodules, metastatic disease is not excluded. 7.  Aortic Atherosclerosis (ICD10-I70.0). Electronically Signed    By: Vanetta Chou M.D.   On: 08/26/2023 14:58   CT T-SPINE NO CHARGE Result Date: 08/26/2023 CLINICAL DATA:  Trauma. EXAM: CT THORACIC AND LUMBAR SPINE WITHOUT CONTRAST TECHNIQUE: Multidetector CT imaging of the thoracic and lumbar spine was performed without contrast. Multiplanar CT image reconstructions were also generated. RADIATION DOSE REDUCTION: This exam was performed according to the departmental dose-optimization program which includes automated exposure control, adjustment of the mA and/or kV according to patient size and/or use of iterative reconstruction technique. COMPARISON:  Lumbar spine CT dated 08/19/2020. FINDINGS: CT THORACIC SPINE FINDINGS Alignment: No acute subluxation. Vertebrae: No acute fracture. The bones are osteopenic. Old T12 compression fracture with approximately 50% loss of vertebral body height anteriorly and anterior wedging. This is relatively similar to the lumbar spine CT of 08/19/2020. Paraspinal and other soft tissues: Negative. Disc levels: No acute findings with degenerative changes. CT LUMBAR SPINE FINDINGS Segmentation: 5 lumbar type vertebrae. Alignment: No acute subluxation. Vertebrae: Old appearing L1 compression fracture with approximately 50% loss of vertebral body height and anterior wedging, new since the prior CT. No definite acute fracture. The bones are osteopenic. Paraspinal and other soft tissues: Negative. Disc levels: Degenerative changes. IMPRESSION: 1. No acute/traumatic thoracic or lumbar spine pathology. 2. Old T12 and L1 compression fractures. Electronically Signed   By: Vanetta Chou M.D.   On: 08/26/2023 14:47   CT L-SPINE NO CHARGE Result Date: 08/26/2023 CLINICAL DATA:  Trauma. EXAM: CT THORACIC AND LUMBAR SPINE WITHOUT CONTRAST TECHNIQUE: Multidetector CT imaging of the thoracic and lumbar  spine was performed without contrast. Multiplanar CT image reconstructions were also generated. RADIATION DOSE REDUCTION: This exam was performed  according to the departmental dose-optimization program which includes automated exposure control, adjustment of the mA and/or kV according to patient size and/or use of iterative reconstruction technique. COMPARISON:  Lumbar spine CT dated 08/19/2020. FINDINGS: CT THORACIC SPINE FINDINGS Alignment: No acute subluxation. Vertebrae: No acute fracture. The bones are osteopenic. Old T12 compression fracture with approximately 50% loss of vertebral body height anteriorly and anterior wedging. This is relatively similar to the lumbar spine CT of 08/19/2020. Paraspinal and other soft tissues: Negative. Disc levels: No acute findings with degenerative changes. CT LUMBAR SPINE FINDINGS Segmentation: 5 lumbar type vertebrae. Alignment: No acute subluxation. Vertebrae: Old appearing L1 compression fracture with approximately 50% loss of vertebral body height and anterior wedging, new since the prior CT. No definite acute fracture. The bones are osteopenic. Paraspinal and other soft tissues: Negative. Disc levels: Degenerative changes. IMPRESSION: 1. No acute/traumatic thoracic or lumbar spine pathology. 2. Old T12 and L1 compression fractures. Electronically Signed   By: Vanetta Chou M.D.   On: 08/26/2023 14:47   DG Elbow Complete Right Result Date: 08/26/2023 CLINICAL DATA:  Fall. EXAM: RIGHT ELBOW - COMPLETE 3+ VIEW COMPARISON:  08/19/2020. FINDINGS: There is no evidence of fracture, dislocation, or joint effusion. Mild degenerative spurring of the coronoid process of the ulna. Soft tissues are unremarkable. IMPRESSION: No acute fracture or dislocation of the right elbow. Electronically Signed   By: Harrietta Sherry M.D.   On: 08/26/2023 14:08   DG FEMUR, MIN 2 VIEWS RIGHT Result Date: 08/26/2023 CLINICAL DATA:  Right hip pain after fall. EXAM: RIGHT FEMUR 2 VIEWS COMPARISON:  Same-day pelvic radiographs dated 08/26/2023 at 12:03 p.m. FINDINGS: Diffuse osseous demineralization. Redemonstrated probable basicervical  fracture of the right proximal femur. Intertrochanteric extension cannot be excluded. No additional fracture identified. No dislocation. Peripheral vascular calcifications. IMPRESSION: Redemonstrated probable basicervical fracture of the right proximal femur. Intertrochanteric extension cannot be excluded. No additional fracture identified. Electronically Signed   By: Harrietta Sherry M.D.   On: 08/26/2023 14:06   CT Head Wo Contrast Result Date: 08/26/2023 CLINICAL DATA:  Altered mental status.  Fall. EXAM: CT HEAD WITHOUT CONTRAST CT CERVICAL SPINE WITHOUT CONTRAST TECHNIQUE: Multidetector CT imaging of the head and cervical spine was performed following the standard protocol without intravenous contrast. Multiplanar CT image reconstructions of the cervical spine were also generated. RADIATION DOSE REDUCTION: This exam was performed according to the departmental dose-optimization program which includes automated exposure control, adjustment of the mA and/or kV according to patient size and/or use of iterative reconstruction technique. COMPARISON:  None Available. FINDINGS: CT HEAD FINDINGS Brain: No acute intracranial hemorrhage. No focal mass lesion. No CT evidence of acute infarction. No midline shift or mass effect. No hydrocephalus. Basilar cisterns are patent. There are periventricular and subcortical white matter hypodensities. Generalized cortical atrophy. Vascular: No hyperdense vessel or unexpected calcification. Skull: Normal. Negative for fracture or focal lesion. Sinuses/Orbits: Paranasal sinuses and mastoid air cells are clear. Orbits are clear. Other: None. CT CERVICAL SPINE FINDINGS Alignment: Normal alignment of the cervical vertebral bodies. Skull base and vertebrae: Normal craniocervical junction. No loss of vertebral body height or disc height. Normal facet articulation. No evidence of fracture. Soft tissues and spinal canal: No prevertebral soft tissue swelling. No perispinal or epidural  hematoma. Disc levels: Bulky RIGHT facet hypertrophy from C3 to C5. Anterior endplate spurring R4-R2. No acute findings Upper chest: Clear Other:  None IMPRESSION: HEAD CT: 1. No acute intracranial findings. 2. Atrophy and white matter microvascular disease. CERVICAL SPINE CT: 1. No cervical spine fracture. 2. Cervical spondylosis. Electronically Signed   By: Jackquline Boxer M.D.   On: 08/26/2023 12:32   CT Cervical Spine Wo Contrast Result Date: 08/26/2023 CLINICAL DATA:  Altered mental status.  Fall. EXAM: CT HEAD WITHOUT CONTRAST CT CERVICAL SPINE WITHOUT CONTRAST TECHNIQUE: Multidetector CT imaging of the head and cervical spine was performed following the standard protocol without intravenous contrast. Multiplanar CT image reconstructions of the cervical spine were also generated. RADIATION DOSE REDUCTION: This exam was performed according to the departmental dose-optimization program which includes automated exposure control, adjustment of the mA and/or kV according to patient size and/or use of iterative reconstruction technique. COMPARISON:  None Available. FINDINGS: CT HEAD FINDINGS Brain: No acute intracranial hemorrhage. No focal mass lesion. No CT evidence of acute infarction. No midline shift or mass effect. No hydrocephalus. Basilar cisterns are patent. There are periventricular and subcortical white matter hypodensities. Generalized cortical atrophy. Vascular: No hyperdense vessel or unexpected calcification. Skull: Normal. Negative for fracture or focal lesion. Sinuses/Orbits: Paranasal sinuses and mastoid air cells are clear. Orbits are clear. Other: None. CT CERVICAL SPINE FINDINGS Alignment: Normal alignment of the cervical vertebral bodies. Skull base and vertebrae: Normal craniocervical junction. No loss of vertebral body height or disc height. Normal facet articulation. No evidence of fracture. Soft tissues and spinal canal: No prevertebral soft tissue swelling. No perispinal or epidural  hematoma. Disc levels: Bulky RIGHT facet hypertrophy from C3 to C5. Anterior endplate spurring R4-R2. No acute findings Upper chest: Clear Other: None IMPRESSION: HEAD CT: 1. No acute intracranial findings. 2. Atrophy and white matter microvascular disease. CERVICAL SPINE CT: 1. No cervical spine fracture. 2. Cervical spondylosis. Electronically Signed   By: Jackquline Boxer M.D.   On: 08/26/2023 12:32   DG Chest Portable 1 View Result Date: 08/26/2023 CLINICAL DATA:  Fall, right hip fracture EXAM: PORTABLE CHEST 1 VIEW COMPARISON:  08/19/2020 FINDINGS: The patient is rotated to the left on today's radiograph, reducing diagnostic sensitivity and specificity. Low lung volumes are present, causing crowding of the pulmonary vasculature. Age indeterminate deformity of the left sixth rib posterolaterally, correlate with any tenderness in this vicinity in determining acuity. Accounting for the low lung volumes, the lungs appear clear. Cardiac and mediastinal margins appear normal. IMPRESSION: 1. Age indeterminate fracture of the left sixth rib posterolaterally, correlate with any tenderness in this vicinity in determining acuity. 2. Low lung volumes. Electronically Signed   By: Ryan Salvage M.D.   On: 08/26/2023 12:26   DG Hip Unilat With Pelvis 2-3 Views Right Result Date: 08/26/2023 CLINICAL DATA:  Fall, right hip pain EXAM: DG HIP (WITH OR WITHOUT PELVIS) 2-3V RIGHT COMPARISON:  None Available. FINDINGS: Suboptimal positioning. High suspicion for fracture, either basicervical or intertrochanteric. Intra-articular femoral neck fracture favored but not absolutely certain. Consider CT of the right hip for differentiation. Spurring of both acetabula.  Bony demineralization. IMPRESSION: 1. Right proximal femoral fracture likely basicervical but possibly intertrochanteric. CT could help differentiate. 2. Bony demineralization. 3. Spurring of both acetabula. Electronically Signed   By: Ryan Salvage M.D.    On: 08/26/2023 12:24     Assessment and plan-   # Acute pulmonary embolism, likely secondary to recent hip surgery as well as hypercoagulable state due to underlying malignancy If family is interested in further workup of colon mass/omental implants, recommend IR biopsy during current admission while she  is IV heparin  to minimize interruption..  # Transverse colon mass with omental implants Check CEA If family is interested in further workup, recommend consult IR for biopsy of omental implant. Given patient's multiple comorbidities, she likely is not candidate for chemotherapy treatments.  Alternative of not proceeding with workup and consider complication is discussed with daughter.  Recommend palliative care service discussion of goals of care.  # Confusion, multifactorial, acute component is likely secondary to hepatic encephalopathy due to decompensated liver cirrhosis  Ammonia level is significantly elevated.    # Liver cirrhosis with splenomegaly and progressive ascites Consider consult IR for diagnostic and therapeutic paracentesis. Rule out SBP.  Recommend GI evaluation.  Thank you for allowing me to participate in the care of this patient.   Zelphia Cap, MD, PhD Hematology Oncology 08/30/2023

## 2023-08-30 NOTE — Progress Notes (Signed)
 OT Cancellation Note  Patient Details Name: Jenna Franklin MRN: [id number] DOB: 1940/07/10   Cancelled Treatment:    Reason Eval/Treat Not Completed: Medical issues which prohibited therapy. Chart review, pt with positive CTA for PE. OT will hold at this time and see when medically appropriate.   Ares Tegtmeyer L. Karyn Brull, OTR/L  08/30/23, 8:13 AM

## 2023-08-30 NOTE — Progress Notes (Signed)
*  PRELIMINARY RESULTS* Echocardiogram 2D Echocardiogram has been performed.  Floydene Harder 08/30/2023, 12:10 PM

## 2023-08-30 NOTE — Progress Notes (Signed)
 New 20g RFA placed via ultrasound for CTA. Pt left floor for CTA @2120 . Pt medicated prior to transport for pain.   Pt returned to unit 2300. CTA results positive for PE. MD ordered Heparin  gtt. Pt in NAD, VSS and pt denies SOB, HR remains WNL.  Pt still has not voided, bladder scan >347. Pt denies ability to void. Cath returned 200 clear dark yellow urine. Residual noted. Foley catheter placed for retention.

## 2023-08-30 NOTE — Progress Notes (Signed)
 PROGRESS NOTE    Jenna Franklin   FMW:[id number] DOB: 03-09-1940  DOA: 08/26/2023 Date of Service: 08/30/23 which is hospital day 4  PCP: Justus Leita DEL, MD    Hospital course / significant events:   HPI: [name] is an 83 year old female with history of liver cirrhosis with splenomegaly and esophageal varices, anxiety, GERD, who presents emergency department for chief concerns of fall with resulting right hip pain.  07/10: admitted to hospitalist service. Underwent R hip fracture repair 07/11: PT/OT recs for SNF.  Discussion today with family regarding colon mass and options re further w/u  07/12: stable, await SNF thru the weekend 07/13: pt remains confused. Tachycardic into this afternoon worsening, dx new PE started on heparin   07/14: remains on heparin . Echo pending and may need to consult vascular. Ascites appears a bit worse today also may need to consult IR. Updated daughter this morning, plan family meeting later this afternoon. Daughter would like oncology to give opinion on treatment options / next steps and from there we can decide how aggressive      Consultants:  Orthopedics  HemOnc  Procedures/Surgeries: Right Hip Intramedullary nail repair 08/26/2023 w/ Dr Lorelle      ASSESSMENT & PLAN:   Right femoral fracture Secondary to fall S/p Right Hip Intramedullary nail repair 08/26/2023 w/ Dr Lorelle Orthopedic surgery following Pain control WBAT  Fall precaution PT, OT Follow up with [facility] in 2 weeks  Pulmonary Embolus RV strain on CT uncertain significance Echo pending Heparin  gtt  Bed rest  Vascular consult if echo showing severe RV strain  Colonic mass with infiltration into omentum, likely malignancy Pulmonary nodule, malignancy cannot be excluded Weight loss Given CT read of colonic mass with omental implants and small pulmonary nodules, high suspicion for malignancy and metastatic disease cannot be excluded at this  time Pt's son and daughter ask to defer conversation w/ patient for now until more lucid, hx memory problems / dementia and family thinks she will not remember or process the conversation right now  Dr Jinny GI spoke 07/11 with patient's daughter, holding off on colonoscopy for now and considering overall frailty and other comorbidities may not be a good candidate for aggressive workup / treatment but would recommend further discussion with oncology team Oncology consult Consider for CT abd/pelv w/ contrast - possible liver mets w/ worsening ascites?  Monitor CMP   Cirrhosis complicated by Esophageal varices Ascites progressing on exam Appears compensated at this time and not in acute exacerbation Restart beta blocker  Caution on heparin  gtt for PE  Consider for CT abd/pelv w/ contrast - possible liver mets w/ worsening ascites?  Monitor CMP and CBC closely  Ascites appears a bit worse today also may need to consult IR pending family discussion this afternoon and if they want aggressive measures   Acute metabolic encephalopathy d/t acute illness / SIRS Underlying cognitive impairment suspect possibly dementia Pt does not have capacity to understand her medical situation / make complex decisions  Ammonia levels pending Consider brain imaging per oncology Daughter and son are involved in her decisions, daughter has been the point of contact which son is in agreement with   For now we are not informing patient of mass/cancer, per family request. Plan for daughter and myself to discuss with patient this afternoon to assess her understanding/reaction and get an idea on goals for treatment   Anxiety PDMP reviewed. currently no active prescription outpatient Prn ativan  as needed   Constipation  Bowel regimen per orders  Urinary retention  Foley   No concerns based on BMI: Body mass index is 22.07 kg/m.SABRA Significantly low or high BMI is associated with higher medical risk.  Underweight -  under 18  overweight - 25 to 29 obese - 30 or more Class 1 obesity: BMI of 30.0 to 34 Class 2 obesity: BMI of 35.0 to 39 Class 3 obesity: BMI of 40.0 to 49 Super Morbid Obesity: BMI 50-59 Super-super Morbid Obesity: BMI 60+ Healthy nutrition and physical activity advised as adjunct to other disease management and risk reduction treatments    DVT prophylaxis: Lovenox  IV fluids: no continuous IV fluids  Nutrition: Regular diet Central lines / other devices: None  Code Status: Full code ACP documentation reviewed:  none on file in [facility]  TOC needs: SNF placement for rehab Medical barriers to dispo: New PE, onc consult, see above              Subjective / Brief ROS:  Patient reports no concerns but says she's not fine though she cannot give me an example of what's bothering her - similar to yesterday On exam I am asking her if anything hurts when I touch (squeezing calves she reports pain, lightly touching her fingers she reports pain, everything I touch I ask if huts and she says yes but when asked initially if she can point to location of worst pain she does not answer) Remains confused  Denies CP/SOB.    Family Communication: discussed over the phone w/ daughter 08/30/23 approx 10:30 AM and plan for family meeting later today   Objective Findings:  Vitals:   08/29/23 2111 08/30/23 0058 08/30/23 0328 08/30/23 0754  BP: (!) 113/59 118/63 121/78 (!) 105/58  Pulse: 91 86 86 82  Resp: 20 20 16 16   Temp: 98.5 F (36.9 C) 97.6 F (36.4 C) 98 F (36.7 C) 99 F (37.2 C)  TempSrc: Oral Oral Oral   SpO2: 97% 97% 97% 95%  Weight:      Height:        Intake/Output Summary (Last 24 hours) at 08/30/2023 1037 Last data filed at 08/30/2023 0900 Gross per 24 hour  Intake 107.85 ml  Output 750 ml  Net -642.15 ml   Filed Weights   08/26/23 1143  Weight: 56.5 kg    Examination:  Physical Exam Constitutional:      General: She is not in acute  distress. Cardiovascular:     Rate and Rhythm: Normal rate and regular rhythm.  Pulmonary:     Effort: Pulmonary effort is normal. No respiratory distress.     Breath sounds: Normal breath sounds.  Abdominal:     General: Bowel sounds are normal. There is distension.     Palpations: Abdomen is soft.     Tenderness: There is no abdominal tenderness.  Skin:    General: Skin is warm and dry.     Coloration: Skin is not pale.  Neurological:     General: No focal deficit present.     Mental Status: She is alert. She is disoriented.     Comments: Oriented to place only after she reads the whiteboard, states she remembers who I am but cannot tell me what my job/role is, not oriented to year/month, not oriented to situation. Same as yesterday. Answering yes to almost all questions   Psychiatric:        Mood and Affect: Mood normal.        Behavior: Behavior normal.  Scheduled Medications:   Chlorhexidine  Gluconate Cloth  6 each Topical Daily   docusate sodium   100 mg Oral BID   feeding supplement  237 mL Oral BID BM   multivitamin with minerals  1 tablet Oral Daily   nadolol   20 mg Oral Daily   polyethylene glycol  17 g Oral BID    Continuous Infusions:  heparin  900 Units/hr (08/29/23 2341)     PRN Medications:  acetaminophen , bisacodyl , HYDROcodone -acetaminophen , labetalol , LORazepam , menthol -cetylpyridinium **OR** phenol, metoCLOPramide  **OR** metoCLOPramide  (REGLAN ) injection, morphine  injection, ondansetron  **OR** ondansetron  (ZOFRAN ) IV  Antimicrobials from admission:  Anti-infectives (From admission, onward)    Start     Dose/Rate Route Frequency Ordered Stop   08/26/23 2300  ceFAZolin  (ANCEF ) IVPB 2g/100 mL premix        2 g 200 mL/hr over 30 Minutes Intravenous Every 6 hours 08/26/23 2015 08/27/23 1059   08/26/23 1545  ceFAZolin  (ANCEF ) IVPB 2g/100 mL premix        2 g 200 mL/hr over 30 Minutes Intravenous  Once 08/26/23 1544 08/26/23 1628            Data Reviewed:  I have personally reviewed the following...  CBC: Recent Labs  Lab 08/26/23 1146 08/27/23 0456 08/28/23 1045  WBC 6.5 5.7 9.3  HGB 10.6* 9.0* 9.9*  HCT 32.7* 28.1* 31.1*  MCV 90.6 89.8 91.5  PLT 142* 112* 189   Basic Metabolic Panel: Recent Labs  Lab 08/26/23 1146 08/27/23 0456 08/28/23 1045  NA 136 137 133*  K 3.7 3.6 3.7  CL 105 104 100  CO2 20* 22 24  GLUCOSE 219* 190* 200*  BUN 8 9 11   CREATININE 0.64 0.65 0.68  CALCIUM 8.2* 8.3* 8.9   GFR: Estimated Creatinine Clearance: 44.1 mL/min (by C-G formula based on SCr of 0.68 mg/dL). Liver Function Tests: Recent Labs  Lab 08/26/23 1146  AST 40  ALT 17  ALKPHOS 59  BILITOT 1.6*  PROT 6.5  ALBUMIN 2.9*   No results for input(s): LIPASE, AMYLASE in the last 168 hours. Recent Labs  Lab 08/26/23 1242  AMMONIA 23   Coagulation Profile: Recent Labs  Lab 08/29/23 2250  INR 1.3*   Cardiac Enzymes: Recent Labs  Lab 08/26/23 1146  CKTOTAL 45   BNP (last 3 results) No results for input(s): PROBNP in the last 8760 hours. HbA1C: No results for input(s): HGBA1C in the last 72 hours. CBG: No results for input(s): GLUCAP in the last 168 hours. Lipid Profile: No results for input(s): CHOL, HDL, LDLCALC, TRIG, CHOLHDL, LDLDIRECT in the last 72 hours. Thyroid  Function Tests: No results for input(s): TSH, T4TOTAL, FREET4, T3FREE, THYROIDAB in the last 72 hours. Anemia Panel: No results for input(s): VITAMINB12, FOLATE, FERRITIN, TIBC, IRON, RETICCTPCT in the last 72 hours. Most Recent Urinalysis On File:     Component Value Date/Time   COLORURINE STRAW (A) 04/21/2019 1455   APPEARANCEUR CLEAR 04/21/2019 1455   LABSPEC 1.010 04/21/2019 1455   PHURINE 6.0 04/21/2019 1455   GLUCOSEU NEGATIVE 04/21/2019 1455   HGBUR NEGATIVE 04/21/2019 1455   BILIRUBINUR neg 09/06/2019 0833   KETONESUR NEGATIVE 04/21/2019 1455   PROTEINUR Negative  09/06/2019 0833   PROTEINUR NEGATIVE 04/21/2019 1455   UROBILINOGEN 0.2 09/06/2019 0833   NITRITE neg 09/06/2019 0833   NITRITE NEGATIVE 04/21/2019 1455   LEUKOCYTESUR Negative 09/06/2019 0833   LEUKOCYTESUR NEGATIVE 04/21/2019 1455   Sepsis Labs: @LABRCNTIP (procalcitonin:4,lacticidven:4) Microbiology: No results found for this or any previous visit (from the past 240 hours).  Radiology Studies last 3 days: CT Angio Chest Pulmonary Embolism (PE) W or WO Contrast Addendum Date: 08/29/2023 ADDENDUM REPORT: 08/29/2023 23:09 ADDENDUM: These results were called by telephone at the time of interpretation on 08/29/2023 at 10:11 p.m. to provider Dr. JINNY Peaches, who verbally acknowledged these results. Electronically Signed   By: Greig Pique M.D.   On: 08/29/2023 23:09   Result Date: 08/29/2023 CLINICAL DATA:  High probability for PE EXAM: CT ANGIOGRAPHY CHEST WITH CONTRAST TECHNIQUE: Multidetector CT imaging of the chest was performed using the standard protocol during bolus administration of intravenous contrast. Multiplanar CT image reconstructions and MIPs were obtained to evaluate the vascular anatomy. RADIATION DOSE REDUCTION: This exam was performed according to the departmental dose-optimization program which includes automated exposure control, adjustment of the mA and/or kV according to patient size and/or use of iterative reconstruction technique. CONTRAST:  75mL OMNIPAQUE  IOHEXOL  350 MG/ML SOLN COMPARISON:  CT of the chest abdomen and pelvis 08/26/2023. the FINDINGS: Cardiovascular: There are segmental and subsegmental pulmonary emboli in the right lower lobe. No other pulmonary emboli are seen. The heart and aorta are normal in size. There is no pericardial effusion. There are atherosclerotic calcifications of the aorta. There is an aberrant right subclavian artery. Mediastinum/Nodes: No enlarged mediastinal, hilar, or axillary lymph nodes. Thyroid  gland, trachea, and esophagus demonstrate no  significant findings. Lungs/Pleura: There are trace bilateral pleural effusions with a small amount of atelectasis in the bilateral lower lobes. There is a 2 mm nodule in the superior segment of the left lower lobe image 6/40. There is a stable right middle lobe nodule measuring 7 mm. The lungs are otherwise clear. Upper Abdomen: There is heterogeneous nodular liver contour. The spleen is enlarged. There is a moderate amount of ascites in the upper abdomen. Musculoskeletal: No acute fractures are identified. T12 chronic compression deformity is unchanged. Review of the MIP images confirms the above findings. IMPRESSION: 1. Segmental and subsegmental pulmonary emboli in the right lower lobe. There is evidence for right heart strain (RV/LV Ratio = 1.2) consistent with at least submassive (intermediate risk) PE. The presence of right heart strain has been associated with an increased risk of morbidity and mortality. 2. Trace bilateral pleural effusions with small amount of atelectasis in the lower lobes. 3. Stable 7 mm right middle lobe nodule. 2 mm left lower lobe nodule. 4. Cirrhotic liver with splenomegaly and moderate ascites in the upper abdomen. Aortic Atherosclerosis (ICD10-I70.0). Electronically Signed: By: Greig Pique M.D. On: 08/29/2023 22:02   DG HIP UNILAT WITH PELVIS 2-3 VIEWS RIGHT Result Date: 08/26/2023 CLINICAL DATA:  ORIF of the right hip. EXAM: DG HIP (WITH OR WITHOUT PELVIS) 2-3V RIGHT COMPARISON:  Right hip radiograph dated 08/25/2025. FINDINGS: Six intraoperative fluoroscopic spot images provided. The total fluoroscopic time is 1 minute, 22 seconds with a cumulative air Karma of 17.06 mGy. Status post ORIF of the right femur. IMPRESSION: Intraoperative fluoroscopic guidance for ORIF of the right femur. Electronically Signed   By: Vanetta Chou M.D.   On: 08/26/2023 17:20   DG C-Arm 1-60 Min-No Report Result Date: 08/26/2023 Fluoroscopy was utilized by the requesting physician.  No  radiographic interpretation.   CT CHEST ABDOMEN PELVIS WO CONTRAST Result Date: 08/26/2023 CLINICAL DATA:  Trauma. EXAM: CT CHEST, ABDOMEN AND PELVIS WITHOUT CONTRAST TECHNIQUE: Multidetector CT imaging of the chest, abdomen and pelvis was performed following the standard protocol without IV contrast. RADIATION DOSE REDUCTION: This exam was performed according to the departmental dose-optimization program which includes automated  exposure control, adjustment of the mA and/or kV according to patient size and/or use of iterative reconstruction technique. COMPARISON:  None Available. FINDINGS: Evaluation of this exam is limited in the absence of intravenous contrast. CT CHEST FINDINGS Cardiovascular: There is no cardiomegaly or pericardial effusion. The thoracic aorta and the central pulmonary arteries are grossly unremarkable. There is a left-sided aortic arch with aberrant right subclavian artery anatomy. Mediastinum/Nodes: No hilar or mediastinal adenopathy. Small hiatal hernia. The esophagus is grossly unremarkable. No mediastinal fluid collection. Lungs/Pleura: Minimal bibasilar subpleural atelectasis. There is a 7 mm right middle lobe and a 4 mm right middle lobe nodules. A 9 mm nodular density at the right lung base along the diaphragm. No consolidative changes. There is no pleural effusion pneumothorax. The central airways are patent. Musculoskeletal: Osteopenia. Old T12 compression fracture. No acute osseous pathology. CT ABDOMEN PELVIS FINDINGS No intra-abdominal free air.  Small ascites. Hepatobiliary: Cirrhosis.  No biliary dilatation.  Cholecystectomy. Pancreas: Unremarkable. No pancreatic ductal dilatation or surrounding inflammatory changes. Spleen: Splenomegaly measuring 15 cm in length. Adrenals/Urinary Tract: The adrenal glands unremarkable. Small right renal upper pole cyst. There is no hydronephrosis or nephrolithiasis on either side. The visualized ureters and urinary bladder marked.  Stomach/Bowel: There is sigmoid diverticulosis. Diffuse thickened appearance of the colon may be related to hepatic colopathy and size. Colitis is not excluded. There is however an area of irregularity and thickening of the colonic wall with nodular protrusion involving the proximal transverse colon (axial 70/4 and coronal 28/7) concerning for malignancy. Further evaluation with colonoscopy recommended. There is no bowel obstruction. The appendix is not well visualized due to ascites. Vascular/Lymphatic: Mild aortoiliac atherosclerotic disease. The IVC is unremarkable. No portal venous gas. No adenopathy. Several small nodules adjacent to the proximal transverse colon as well as small omental nodules in the right upper abdomen (64/4) metastasis. Reproductive: Hysterectomy. Other: Soft tissue nodules in the anterior abdomen and involving the right rectus sheath measure up to 3.8 x 2.0 cm consistent with metastatic implants. Musculoskeletal: Osteopenia with degenerative changes of the spine. Old L1 compression fracture with anterior wedging. There is mildly angulated fracture of the right femoral neck involving the basicervical and trochanteric region. IMPRESSION: 1. Mildly angulated fracture of the right femoral neck. 2. No acute/traumatic intrathoracic pathology. 3. Cirrhosis with splenomegaly and small ascites. 4. Sigmoid diverticulosis. 5. Findings concerning for colonic mass/malignancy of the proximal transverse colon with omental implants. Further evaluation with colonoscopy is recommended. 6. Small pulmonary nodules, metastatic disease is not excluded. 7.  Aortic Atherosclerosis (ICD10-I70.0). Electronically Signed   By: Vanetta Chou M.D.   On: 08/26/2023 14:58   CT T-SPINE NO CHARGE Result Date: 08/26/2023 CLINICAL DATA:  Trauma. EXAM: CT THORACIC AND LUMBAR SPINE WITHOUT CONTRAST TECHNIQUE: Multidetector CT imaging of the thoracic and lumbar spine was performed without contrast. Multiplanar CT image  reconstructions were also generated. RADIATION DOSE REDUCTION: This exam was performed according to the departmental dose-optimization program which includes automated exposure control, adjustment of the mA and/or kV according to patient size and/or use of iterative reconstruction technique. COMPARISON:  Lumbar spine CT dated 08/19/2020. FINDINGS: CT THORACIC SPINE FINDINGS Alignment: No acute subluxation. Vertebrae: No acute fracture. The bones are osteopenic. Old T12 compression fracture with approximately 50% loss of vertebral body height anteriorly and anterior wedging. This is relatively similar to the lumbar spine CT of 08/19/2020. Paraspinal and other soft tissues: Negative. Disc levels: No acute findings with degenerative changes. CT LUMBAR SPINE FINDINGS Segmentation: 5 lumbar type vertebrae.  Alignment: No acute subluxation. Vertebrae: Old appearing L1 compression fracture with approximately 50% loss of vertebral body height and anterior wedging, new since the prior CT. No definite acute fracture. The bones are osteopenic. Paraspinal and other soft tissues: Negative. Disc levels: Degenerative changes. IMPRESSION: 1. No acute/traumatic thoracic or lumbar spine pathology. 2. Old T12 and L1 compression fractures. Electronically Signed   By: Vanetta Chou M.D.   On: 08/26/2023 14:47   CT L-SPINE NO CHARGE Result Date: 08/26/2023 CLINICAL DATA:  Trauma. EXAM: CT THORACIC AND LUMBAR SPINE WITHOUT CONTRAST TECHNIQUE: Multidetector CT imaging of the thoracic and lumbar spine was performed without contrast. Multiplanar CT image reconstructions were also generated. RADIATION DOSE REDUCTION: This exam was performed according to the departmental dose-optimization program which includes automated exposure control, adjustment of the mA and/or kV according to patient size and/or use of iterative reconstruction technique. COMPARISON:  Lumbar spine CT dated 08/19/2020. FINDINGS: CT THORACIC SPINE FINDINGS Alignment:  No acute subluxation. Vertebrae: No acute fracture. The bones are osteopenic. Old T12 compression fracture with approximately 50% loss of vertebral body height anteriorly and anterior wedging. This is relatively similar to the lumbar spine CT of 08/19/2020. Paraspinal and other soft tissues: Negative. Disc levels: No acute findings with degenerative changes. CT LUMBAR SPINE FINDINGS Segmentation: 5 lumbar type vertebrae. Alignment: No acute subluxation. Vertebrae: Old appearing L1 compression fracture with approximately 50% loss of vertebral body height and anterior wedging, new since the prior CT. No definite acute fracture. The bones are osteopenic. Paraspinal and other soft tissues: Negative. Disc levels: Degenerative changes. IMPRESSION: 1. No acute/traumatic thoracic or lumbar spine pathology. 2. Old T12 and L1 compression fractures. Electronically Signed   By: Vanetta Chou M.D.   On: 08/26/2023 14:47   DG Elbow Complete Right Result Date: 08/26/2023 CLINICAL DATA:  Fall. EXAM: RIGHT ELBOW - COMPLETE 3+ VIEW COMPARISON:  08/19/2020. FINDINGS: There is no evidence of fracture, dislocation, or joint effusion. Mild degenerative spurring of the coronoid process of the ulna. Soft tissues are unremarkable. IMPRESSION: No acute fracture or dislocation of the right elbow. Electronically Signed   By: Harrietta Sherry M.D.   On: 08/26/2023 14:08   DG FEMUR, MIN 2 VIEWS RIGHT Result Date: 08/26/2023 CLINICAL DATA:  Right hip pain after fall. EXAM: RIGHT FEMUR 2 VIEWS COMPARISON:  Same-day pelvic radiographs dated 08/26/2023 at 12:03 p.m. FINDINGS: Diffuse osseous demineralization. Redemonstrated probable basicervical fracture of the right proximal femur. Intertrochanteric extension cannot be excluded. No additional fracture identified. No dislocation. Peripheral vascular calcifications. IMPRESSION: Redemonstrated probable basicervical fracture of the right proximal femur. Intertrochanteric extension cannot be  excluded. No additional fracture identified. Electronically Signed   By: Harrietta Sherry M.D.   On: 08/26/2023 14:06   CT Head Wo Contrast Result Date: 08/26/2023 CLINICAL DATA:  Altered mental status.  Fall. EXAM: CT HEAD WITHOUT CONTRAST CT CERVICAL SPINE WITHOUT CONTRAST TECHNIQUE: Multidetector CT imaging of the head and cervical spine was performed following the standard protocol without intravenous contrast. Multiplanar CT image reconstructions of the cervical spine were also generated. RADIATION DOSE REDUCTION: This exam was performed according to the departmental dose-optimization program which includes automated exposure control, adjustment of the mA and/or kV according to patient size and/or use of iterative reconstruction technique. COMPARISON:  None Available. FINDINGS: CT HEAD FINDINGS Brain: No acute intracranial hemorrhage. No focal mass lesion. No CT evidence of acute infarction. No midline shift or mass effect. No hydrocephalus. Basilar cisterns are patent. There are periventricular and subcortical white matter hypodensities.  Generalized cortical atrophy. Vascular: No hyperdense vessel or unexpected calcification. Skull: Normal. Negative for fracture or focal lesion. Sinuses/Orbits: Paranasal sinuses and mastoid air cells are clear. Orbits are clear. Other: None. CT CERVICAL SPINE FINDINGS Alignment: Normal alignment of the cervical vertebral bodies. Skull base and vertebrae: Normal craniocervical junction. No loss of vertebral body height or disc height. Normal facet articulation. No evidence of fracture. Soft tissues and spinal canal: No prevertebral soft tissue swelling. No perispinal or epidural hematoma. Disc levels: Bulky RIGHT facet hypertrophy from C3 to C5. Anterior endplate spurring R4-R2. No acute findings Upper chest: Clear Other: None IMPRESSION: HEAD CT: 1. No acute intracranial findings. 2. Atrophy and white matter microvascular disease. CERVICAL SPINE CT: 1. No cervical spine  fracture. 2. Cervical spondylosis. Electronically Signed   By: Jackquline Boxer M.D.   On: 08/26/2023 12:32   CT Cervical Spine Wo Contrast Result Date: 08/26/2023 CLINICAL DATA:  Altered mental status.  Fall. EXAM: CT HEAD WITHOUT CONTRAST CT CERVICAL SPINE WITHOUT CONTRAST TECHNIQUE: Multidetector CT imaging of the head and cervical spine was performed following the standard protocol without intravenous contrast. Multiplanar CT image reconstructions of the cervical spine were also generated. RADIATION DOSE REDUCTION: This exam was performed according to the departmental dose-optimization program which includes automated exposure control, adjustment of the mA and/or kV according to patient size and/or use of iterative reconstruction technique. COMPARISON:  None Available. FINDINGS: CT HEAD FINDINGS Brain: No acute intracranial hemorrhage. No focal mass lesion. No CT evidence of acute infarction. No midline shift or mass effect. No hydrocephalus. Basilar cisterns are patent. There are periventricular and subcortical white matter hypodensities. Generalized cortical atrophy. Vascular: No hyperdense vessel or unexpected calcification. Skull: Normal. Negative for fracture or focal lesion. Sinuses/Orbits: Paranasal sinuses and mastoid air cells are clear. Orbits are clear. Other: None. CT CERVICAL SPINE FINDINGS Alignment: Normal alignment of the cervical vertebral bodies. Skull base and vertebrae: Normal craniocervical junction. No loss of vertebral body height or disc height. Normal facet articulation. No evidence of fracture. Soft tissues and spinal canal: No prevertebral soft tissue swelling. No perispinal or epidural hematoma. Disc levels: Bulky RIGHT facet hypertrophy from C3 to C5. Anterior endplate spurring R4-R2. No acute findings Upper chest: Clear Other: None IMPRESSION: HEAD CT: 1. No acute intracranial findings. 2. Atrophy and white matter microvascular disease. CERVICAL SPINE CT: 1. No cervical spine  fracture. 2. Cervical spondylosis. Electronically Signed   By: Jackquline Boxer M.D.   On: 08/26/2023 12:32   DG Chest Portable 1 View Result Date: 08/26/2023 CLINICAL DATA:  Fall, right hip fracture EXAM: PORTABLE CHEST 1 VIEW COMPARISON:  08/19/2020 FINDINGS: The patient is rotated to the left on today's radiograph, reducing diagnostic sensitivity and specificity. Low lung volumes are present, causing crowding of the pulmonary vasculature. Age indeterminate deformity of the left sixth rib posterolaterally, correlate with any tenderness in this vicinity in determining acuity. Accounting for the low lung volumes, the lungs appear clear. Cardiac and mediastinal margins appear normal. IMPRESSION: 1. Age indeterminate fracture of the left sixth rib posterolaterally, correlate with any tenderness in this vicinity in determining acuity. 2. Low lung volumes. Electronically Signed   By: Ryan Salvage M.D.   On: 08/26/2023 12:26   DG Hip Unilat With Pelvis 2-3 Views Right Result Date: 08/26/2023 CLINICAL DATA:  Fall, right hip pain EXAM: DG HIP (WITH OR WITHOUT PELVIS) 2-3V RIGHT COMPARISON:  None Available. FINDINGS: Suboptimal positioning. High suspicion for fracture, either basicervical or intertrochanteric. Intra-articular femoral neck fracture favored  but not absolutely certain. Consider CT of the right hip for differentiation. Spurring of both acetabula.  Bony demineralization. IMPRESSION: 1. Right proximal femoral fracture likely basicervical but possibly intertrochanteric. CT could help differentiate. 2. Bony demineralization. 3. Spurring of both acetabula. Electronically Signed   By: Ryan Salvage M.D.   On: 08/26/2023 12:24       Time spent: 50 min     Laneta Blunt, DO [facility] 08/30/2023, 10:37 AM    Dictation software may have been used to generate the above note. Typos may occur and escape review in typed/dictated notes. Please contact Dr Blunt directly for  clarity if needed.  Staff may message me via secure chat in Epic  but this may not receive an immediate response,  please page me for urgent matters!  If 7PM-7AM, please contact night coverage [email or web address]

## 2023-08-30 NOTE — Telephone Encounter (Signed)
 Patient Product/process development scientist completed.    The patient is insured through [employer]. Patient has Medicare and is not eligible for a copay card, but may be able to apply for patient assistance or Medicare RX Payment Plan (Patient Must reach out to their plan, if eligible for payment plan), if available.    Ran test claim for Eliquis  Starter Pack and the current 30 day co-pay is $188.36.  Ran test claim for Xarelto Starter Pack and the current 30 day co-pay is $256.14.  This test claim was processed through [facility]- copay amounts may vary at other pharmacies due to pharmacy/plan contracts, or as the patient moves through the different stages of their insurance plan.     Reyes Sharps, CPHT Pharmacy Technician III Certified Patient Advocate [facility] Patient Advocate Team Direct Number: [phone]  Fax: [phone]

## 2023-08-30 NOTE — Progress Notes (Signed)
   Subjective: 4 Days Post-Op Procedure(s) (LRB): FIXATION, FRACTURE, INTERTROCHANTERIC, WITH INTRAMEDULLARY ROD (Right) Patient reports pain as mild.   Tachycardic last night, PE work up postive. Started on Heparin  Denies any CP, SOB, ABD pain. We will continue therapy today.    Objective: Vital signs in last 24 hours: Temp:  [97.6 F (36.4 C)-99 F (37.2 C)] 99 F (37.2 C) (07/14 0754) Pulse Rate:  [82-139] 82 (07/14 0754) Resp:  [16-20] 16 (07/14 0754) BP: (105-141)/(58-78) 105/58 (07/14 0754) SpO2:  [95 %-99 %] 95 % (07/14 0754)  Intake/Output from previous day: 07/13 0701 - 07/14 0700 In: 227.9 [P.O.:180; I.V.:47.9] Out: 750 [Urine:750] Intake/Output this shift: No intake/output data recorded.  Recent Labs    08/28/23 1045  HGB 9.9*   Recent Labs    08/28/23 1045  WBC 9.3  RBC 3.40*  HCT 31.1*  PLT 189   Recent Labs    08/28/23 1045  NA 133*  K 3.7  CL 100  CO2 24  BUN 11  CREATININE 0.68  GLUCOSE 200*  CALCIUM 8.9   Recent Labs    08/29/23 2250  INR 1.3*    EXAM General - Patient is Alert, Appropriate, and Confused Extremity - Neurovascular intact Sensation intact distally Intact pulses distally Dorsiflexion/Plantar flexion intact Thigh soft, so significant swelling Dressing - dressing C/D/I and no drainage Motor Function - intact, moving foot and toes well on exam.   Past Medical History:  Diagnosis Date   Adjustment disorder with anxious mood 10/05/2013   Anxiety    Arthritis    back   Bronchitis    currently   Calculus of kidney 03/25/2015   CHF (congestive heart failure) (HCC)    pt says she thinks she has this   Cirrhosis of liver (HCC) 2016   Depression    Esophageal varices (HCC)    Esophageal varices (HCC)    Gastritis without bleeding 2019   GERD (gastroesophageal reflux disease)    H/O: depression    History of kidney stones    Hx of sinus tachycardia 07/06/2017   Hypertension    Nerve root pain 12/11/2011    Non-alcoholic fatty liver disease    Nonalcoholic fatty liver disease    Osteopenia    Osteopenia    Reflux    in past   Spinal stenosis    Tachycardia 03/25/2015   Tendinopathy of right gluteus medius 01/01/2017   Vertigo    3 yrs ago   Vitamin D deficiency    Vitamin D deficiency     Assessment/Plan:   4 Days Post-Op Procedure(s) (LRB): FIXATION, FRACTURE, INTERTROCHANTERIC, WITH INTRAMEDULLARY ROD (Right) Principal Problem:   Right femoral fracture (HCC) Active Problems:   Anxiety   Acid reflux   Cirrhosis of liver with ascites (HCC)   Esophageal varices in cirrhosis (HCC)   Colonic mass   Weight loss   Protein-calorie malnutrition, severe  Estimated body mass index is 22.07 kg/m as calculated from the following:   Height as of this encounter: 5' 3 (1.6 m).   Weight as of this encounter: 56.5 kg. Advance diet Up with therapy Pain well controlled Vital sign are stable Heparin  for PE CM to assist with discharge to SNF  Follow up with [facility] ortho 2 weeks post op    DVT Prophylaxis - TED hose and SCDs heparin  Weight-Bearing as tolerated to right leg   T. Medford Amber, PA-C [facility] 08/30/2023, 10:03 AM

## 2023-08-30 NOTE — Consult Note (Signed)
 Pharmacy Consult Note - Anticoagulation  Pharmacy Consult for heparin  Indication: pulmonary embolus  PATIENT MEASUREMENTS: Height: 5' 3 (160 cm) Weight: 56.5 kg (124 lb 9.6 oz) IBW/kg (Calculated) : 52.4 HEPARIN  DW (KG): 56.5  VITAL SIGNS: Temp: 99 F (37.2 C) (07/14 0754) Temp Source: Oral (07/14 0328) BP: 105/58 (07/14 0754) Pulse Rate: 82 (07/14 0754)  Recent Labs    08/28/23 1045 08/29/23 2250 08/30/23 0740  HGB 9.9*  --   --   HCT 31.1*  --   --   PLT 189  --   --   APTT  --  32  --   LABPROT  --  17.2*  --   INR  --  1.3*  --   HEPARINUNFRC  --   --  0.43  CREATININE 0.68  --   --     Estimated Creatinine Clearance: 44.1 mL/min (by C-G formula based on SCr of 0.68 mg/dL).  PAST MEDICAL HISTORY: Past Medical History:  Diagnosis Date   Adjustment disorder with anxious mood 10/05/2013   Anxiety    Arthritis    back   Bronchitis    currently   Calculus of kidney 03/25/2015   CHF (congestive heart failure) (HCC)    pt says she thinks she has this   Cirrhosis of liver (HCC) 2016   Depression    Esophageal varices (HCC)    Esophageal varices (HCC)    Gastritis without bleeding 2019   GERD (gastroesophageal reflux disease)    H/O: depression    History of kidney stones    Hx of sinus tachycardia 07/06/2017   Hypertension    Nerve root pain 12/11/2011   Non-alcoholic fatty liver disease    Nonalcoholic fatty liver disease    Osteopenia    Osteopenia    Reflux    in past   Spinal stenosis    Tachycardia 03/25/2015   Tendinopathy of right gluteus medius 01/01/2017   Vertigo    3 yrs ago   Vitamin D deficiency    Vitamin D deficiency     ASSESSMENT: 83 y.o. female with PMH including cirrhosis with ascites, esophageal varices, colonic mass concerning for malignancy is presenting with right femoral fracture and pulmonary emboli. CT chest demonstrates pulmonary emboli in RLL with evidence for right heart strain. Patient is not on chronic anticoagulation per  chart review but has received prophylactic enoxaparin  the past three days. Pharmacy has been consulted to initiate and manage heparin  intravenous infusion.  Pertinent medications: Enoxaparin  40mg , last administered 7/13 @ 0919  Date Time HL Rate/Comment 07/14 0740 0.43 Therapeutic x1 on 900 units/hour  Goal(s) of therapy: Heparin  level 0.3 - 0.7 units/mL Monitor platelets by anticoagulation protocol: Yes   Baseline anticoagulation labs: Recent Labs    08/28/23 1045 08/29/23 2250  APTT  --  32  INR  --  1.3*  HGB 9.9*  --   PLT 189  --    Baseline aPTT and INR have been ordered  PLAN: Heparin  level is therapeutic x1 this AM Continue heparin  infusion at 900 units/hour Check heparin  level in 8 hours to confirm then daily while on heparin  infusion Monitor CBC daily while on heparin  infusion  Thank you for involving pharmacy in this patient's care.   Damien Napoleon, PharmD Clinical Pharmacist 08/30/2023 8:54 AM

## 2023-08-30 NOTE — Consult Note (Signed)
 Pharmacy Consult Note - Anticoagulation  Pharmacy Consult for heparin  Indication: pulmonary embolus  PATIENT MEASUREMENTS: Height: 5' 3 (160 cm) Weight: 56.5 kg (124 lb 9.6 oz) IBW/kg (Calculated) : 52.4 HEPARIN  DW (KG): 56.5  VITAL SIGNS: Temp: 97.3 F (36.3 C) (07/14 1423) BP: 122/54 (07/14 1423) Pulse Rate: 82 (07/14 1423)  Recent Labs    08/29/23 2250 08/30/23 0740 08/30/23 1025 08/30/23 1543  HGB  --   --  8.9*  --   HCT  --   --  27.1*  --   PLT  --   --  141*  --   APTT 32  --   --   --   LABPROT 17.2*  --   --   --   INR 1.3*  --   --   --   HEPARINUNFRC  --    < >  --  0.32  CREATININE  --   --  0.58  --    < > = values in this interval not displayed.    Estimated Creatinine Clearance: 44.1 mL/min (by C-G formula based on SCr of 0.58 mg/dL).  PAST MEDICAL HISTORY: Past Medical History:  Diagnosis Date   Adjustment disorder with anxious mood 10/05/2013   Anxiety    Arthritis    back   Bronchitis    currently   Calculus of kidney 03/25/2015   CHF (congestive heart failure) (HCC)    pt says she thinks she has this   Cirrhosis of liver (HCC) 2016   Depression    Esophageal varices (HCC)    Esophageal varices (HCC)    Gastritis without bleeding 2019   GERD (gastroesophageal reflux disease)    H/O: depression    History of kidney stones    Hx of sinus tachycardia 07/06/2017   Hypertension    Nerve root pain 12/11/2011   Non-alcoholic fatty liver disease    Nonalcoholic fatty liver disease    Osteopenia    Osteopenia    Reflux    in past   Spinal stenosis    Tachycardia 03/25/2015   Tendinopathy of right gluteus medius 01/01/2017   Vertigo    3 yrs ago   Vitamin D deficiency    Vitamin D deficiency     ASSESSMENT: 83 y.o. female with PMH including cirrhosis with ascites, esophageal varices, colonic mass concerning for malignancy is presenting with right femoral fracture and pulmonary emboli. CT chest demonstrates pulmonary emboli in RLL with  evidence for right heart strain. Patient is not on chronic anticoagulation per chart review but has received prophylactic enoxaparin  the past three days. Pharmacy has been consulted to initiate and manage heparin  intravenous infusion.  Pertinent medications: Enoxaparin  40mg , last administered 7/13 @ 0919  Date Time HL Rate/Comment 07/14 0740 0.43 Therapeutic x 1; 900 un/hr 07/14 1543 0.32 Therapeutic x 2; 900 un/hr  Goal(s) of therapy: Heparin  level 0.3 - 0.7 units/mL Monitor platelets by anticoagulation protocol: Yes   Baseline anticoagulation labs: Recent Labs    08/28/23 1045 08/29/23 2250 08/30/23 1025  APTT  --  32  --   INR  --  1.3*  --   HGB 9.9*  --  8.9*  PLT 189  --  141*   Baseline aPTT and INR have been ordered  PLAN: Heparin  level is therapeutic x 2 Continue heparin  infusion at 900 units/hour Check heparin  level tomorrow AM Monitor CBC daily while on heparin  infusion  Thank you for involving pharmacy in this patient's care.  Marolyn KATHEE Mare 08/30/2023 5:36 PM

## 2023-08-31 DIAGNOSIS — S72001A Fracture of unspecified part of neck of right femur, initial encounter for closed fracture: Secondary | ICD-10-CM | POA: Diagnosis not present

## 2023-08-31 LAB — CBC
HCT: 26.1 % — ABNORMAL LOW (ref 36.0–46.0)
Hemoglobin: 8.4 g/dL — ABNORMAL LOW (ref 12.0–15.0)
MCH: 28.9 pg (ref 26.0–34.0)
MCHC: 32.2 g/dL (ref 30.0–36.0)
MCV: 89.7 fL (ref 80.0–100.0)
Platelets: 117 K/uL — ABNORMAL LOW (ref 150–400)
RBC: 2.91 MIL/uL — ABNORMAL LOW (ref 3.87–5.11)
RDW: 16.6 % — ABNORMAL HIGH (ref 11.5–15.5)
WBC: 2.7 K/uL — ABNORMAL LOW (ref 4.0–10.5)
nRBC: 0 % (ref 0.0–0.2)

## 2023-08-31 LAB — COMPREHENSIVE METABOLIC PANEL WITH GFR
ALT: 16 U/L (ref 0–44)
AST: 48 U/L — ABNORMAL HIGH (ref 15–41)
Albumin: 2.6 g/dL — ABNORMAL LOW (ref 3.5–5.0)
Alkaline Phosphatase: 79 U/L (ref 38–126)
Anion gap: 12 (ref 5–15)
BUN: 12 mg/dL (ref 8–23)
CO2: 20 mmol/L — ABNORMAL LOW (ref 22–32)
Calcium: 8.2 mg/dL — ABNORMAL LOW (ref 8.9–10.3)
Chloride: 104 mmol/L (ref 98–111)
Creatinine, Ser: 0.41 mg/dL — ABNORMAL LOW (ref 0.44–1.00)
GFR, Estimated: >60 mL/min (ref >60)
Glucose, Bld: 168 mg/dL — ABNORMAL HIGH (ref 70–99)
Potassium: 3.7 mmol/L (ref 3.5–5.1)
Sodium: 136 mmol/L (ref 135–145)
Total Bilirubin: 1.1 mg/dL (ref 0.0–1.2)
Total Protein: 6 g/dL — ABNORMAL LOW (ref 6.5–8.1)

## 2023-08-31 LAB — ECHOCARDIOGRAM COMPLETE
Height: 63 in
S' Lateral: 2.8 cm
Weight: 1993.6 [oz_av]

## 2023-08-31 LAB — AMMONIA: Ammonia: 28 umol/L (ref 9–35)

## 2023-08-31 LAB — HEPARIN LEVEL (UNFRACTIONATED): Heparin Unfractionated: 0.3 [IU]/mL (ref 0.30–0.70)

## 2023-08-31 NOTE — Progress Notes (Signed)
 Occupational Therapy Treatment Patient Details Name: Jenna Franklin MRN: [id number] DOB: [birth year] Today's Date: 08/31/2023   History of present illness Jenna Franklin is an 83 year old female with history of liver cirrhosis with splenomegaly and esophageal varices, anxiety, GERD, who presents emergency department for chief concerns of fall with resulting right hip pain. S/p R IM nailing 08/26/23.   OT comments  Pt is supine in bed on arrival. Alert and agreeable to OT session. She reports mild BLE pain at rest. Pt with acute PE on hep drip over 24 hours in therapeutic range, however currently her HR at 113-115 at rest in bed, therefore session performed at bed level. Session focused on self feeding and grooming tasks. Bed placed in chair position with pt tolerating well throughout session. She performed all self feeding with set up assist, as well as oral care with supervision and min cueing for initiation and attention to task. Pt reports feeling fatigued and further activity deferred d/t HR. She was left in chair position in bed with nurse aware and all needs in place. She will cont to require skilled acute OT services to maximize her safety and IND to return to PLOF.       If plan is discharge home, recommend the following:  Two people to help with walking and/or transfers;A lot of help with bathing/dressing/bathroom;Assistance with cooking/housework;Assist for transportation;Supervision due to cognitive status;Direct supervision/assist for medications management;Direct supervision/assist for financial management;Help with stairs or ramp for entrance   Equipment Recommendations  Other (comment) (defer)    Recommendations for Other Services      Precautions / Restrictions Precautions Precautions: Fall Recall of Precautions/Restrictions: Impaired Precaution/Restrictions Comments: Poor safety awareness Restrictions Weight Bearing Restrictions Per Provider Order: Yes RLE Weight  Bearing Per Provider Order: Weight bearing as tolerated       Mobility Bed Mobility               General bed mobility comments: bed placed in chair position for entire session and left in chair position with nurse aware and pt reporting she is comfortable d/t acute PE    Transfers                   General transfer comment: deferred d/t acute PE and resting HR 113-115     Balance                                           ADL either performed or assessed with clinical judgement   ADL Overall ADL's : Needs assistance/impaired Eating/Feeding: Sitting;Independent;Set up Eating/Feeding Details (indicate cue type and reason): bed in chair position with mittens removed, pt able to self feed with set up assist/all items in reach Grooming: Oral care;Sitting;Set up;Cueing for sequencing Grooming Details (indicate cue type and reason): cueing for initiation and attention to task to perform oral care with bed in chair position d/t acute PE                                    Extremity/Trunk Assessment              Vision       Perception     Praxis     Communication Communication Communication: No apparent difficulties   Cognition Arousal: Alert Behavior During Therapy: Anxious  Following commands: Impaired Following commands impaired: Follows one step commands inconsistently, Follows one step commands with increased time      Cueing   Cueing Techniques: Verbal cues, Tactile cues, Gestural cues, Visual cues  Exercises      Shoulder Instructions       General Comments pt with acute PE on hep drip over 24 hours in therapeutic range; HR 113-115 at rest in bed, therefore session performed at bed level    Pertinent Vitals/ Pain       Pain Assessment Pain Assessment: Faces Faces Pain Scale: Hurts little more Pain Location: BLE R>L in bed to chair position this date Pain  Descriptors / Indicators: Sore Pain Intervention(s): Monitored during session, Repositioned  Home Living                                          Prior Functioning/Environment              Frequency  Min 2X/week        Progress Toward Goals  OT Goals(current goals can now be found in the care plan section)  Progress towards OT goals: Progressing toward goals  Acute Rehab OT Goals OT Goal Formulation: With patient Time For Goal Achievement: 09/10/23 Potential to Achieve Goals: Good  Plan      Co-evaluation                 AM-PAC OT 6 Clicks Daily Activity     Outcome Measure   Help from another person eating meals?: None Help from another person taking care of personal grooming?: A Little Help from another person toileting, which includes using toliet, bedpan, or urinal?: A Lot Help from another person bathing (including washing, rinsing, drying)?: A Lot Help from another person to put on and taking off regular upper body clothing?: A Little Help from another person to put on and taking off regular lower body clothing?: A Lot 6 Click Score: 16    End of Session    OT Visit Diagnosis: Unsteadiness on feet (R26.81);History of falling (Z91.81);Other abnormalities of gait and mobility (R26.89);Muscle weakness (generalized) (M62.81);Pain Pain - Right/Left: Right Pain - part of body: Hip;Leg   Activity Tolerance Patient tolerated treatment well;Patient limited by fatigue   Patient Left in bed;with call bell/phone within reach;with bed alarm set;with SCD's reapplied   Nurse Communication Mobility status        Time: 8480-8452 OT Time Calculation (min): 28 min  Charges: OT General Charges $OT Visit: 1 Visit OT Treatments $Self Care/Home Management : 23-37 mins  Lionel Woodberry, OTR/L  08/31/23, 4:26 PM   [name] [name] 08/31/2023, 4:23 PM

## 2023-08-31 NOTE — Progress Notes (Signed)
 Physical Therapy Treatment Patient Details Name: Jenna Franklin MRN: [id number] DOB: [birth year] Today's Date: 08/31/2023   History of Present Illness Jenna Franklin is an 83 year old female with history of liver cirrhosis with splenomegaly and esophageal varices, anxiety, GERD, who presents emergency department for chief concerns of fall with resulting right hip pain. S/p R IM nailing 08/26/23.    PT Comments  Pt cleared by MD for therapy session.  Conservative session as pt remains on IV heparin  and 48 hours is at 23:41 today.  Pt was noted to be inc BM so session mostly consisted of bed mobility skills rolling left/right with max a x 1/dependant.  She is able to reach and hold position in side lying fairly well.   She is painful and does resist ROM BLE's.  Positioned for comfort.  Further mobility deferred at this time.   If plan is discharge home, recommend the following: Two people to help with walking and/or transfers;Two people to help with bathing/dressing/bathroom;Assistance with cooking/housework;Assist for transportation;Supervision due to cognitive status;Help with stairs or ramp for entrance   Can travel by private vehicle        Equipment Recommendations       Recommendations for Other Services       Precautions / Restrictions Precautions Precautions: Fall Recall of Precautions/Restrictions: Impaired Precaution/Restrictions Comments: Poor safety awareness Restrictions Weight Bearing Restrictions Per Provider Order: Yes RLE Weight Bearing Per Provider Order: Weight bearing as tolerated     Mobility  Bed Mobility Overal bed mobility: Needs Assistance Bed Mobility: Rolling Rolling: Total assist, Max assist         General bed mobility comments: rolling left/rigth for inc BM    Transfers                        Ambulation/Gait                   Stairs             Wheelchair Mobility     Tilt Bed    Modified Rankin (Stroke  Patients Only)       Balance                                            Communication Communication Communication: No apparent difficulties  Cognition Arousal: Alert Behavior During Therapy: Anxious   PT - Cognitive impairments: History of cognitive impairments                         Following commands: Impaired Following commands impaired: Follows one step commands inconsistently    Cueing Cueing Techniques: Verbal cues, Tactile cues, Gestural cues, Visual cues  Exercises Other Exercises Other Exercises: supine BLE PROM and care for BM/linens    General Comments        Pertinent Vitals/Pain Pain Assessment Pain Assessment: Faces Faces Pain Scale: Hurts whole lot Pain Location: BLE R>L with ROM and mobility Pain Descriptors / Indicators: Moaning, Grimacing, Guarding, Discomfort Pain Intervention(s): Monitored during session, Repositioned, Limited activity within patient's tolerance    Home Living                          Prior Function            PT Goals (current goals can now  be found in the care plan section) Progress towards PT goals: Not progressing toward goals - comment    Frequency    7X/week      PT Plan      Co-evaluation              AM-PAC PT 6 Clicks Mobility   Outcome Measure  Help needed turning from your back to your side while in a flat bed without using bedrails?: Total Help needed moving from lying on your back to sitting on the side of a flat bed without using bedrails?: Total Help needed moving to and from a bed to a chair (including a wheelchair)?: Total Help needed standing up from a chair using your arms (e.g., wheelchair or bedside chair)?: Total Help needed to walk in hospital room?: Total Help needed climbing 3-5 steps with a railing? : Total 6 Click Score: 6    End of Session   Activity Tolerance: Patient tolerated treatment well;Patient limited by pain Patient left: in  bed;with bed alarm set;with call bell/phone within reach Nurse Communication: Mobility status;Other (comment) PT Visit Diagnosis: Muscle weakness (generalized) (M62.81);Difficulty in walking, not elsewhere classified (R26.2);Pain Pain - Right/Left: Right Pain - part of body: Hip     Time: 1015-1024 PT Time Calculation (min) (ACUTE ONLY): 9 min  Charges:    $Therapeutic Activity: 8-22 mins PT General Charges $$ ACUTE PT VISIT: 1 Visit                   Lauraine Gills, PTA 08/31/23, 10:39 AM

## 2023-08-31 NOTE — Progress Notes (Signed)
   08/31/23 1932  Assess: MEWS Score  Temp 99.1 F (37.3 C)  BP 107/64  MAP (mmHg) 78  Pulse Rate (!) 114  Resp 16  SpO2 98 %  O2 Device Room Air  Assess: MEWS Score  MEWS Temp 0  MEWS Systolic 0  MEWS Pulse 2  MEWS RR 0  MEWS LOC 0  MEWS Score 2  MEWS Score Color Yellow  Assess: if the MEWS score is Yellow or Red  Were vital signs accurate and taken at a resting state? Yes  Does the patient meet 2 or more of the SIRS criteria? No  MEWS guidelines implemented  Yes, yellow  Treat  MEWS Interventions Considered administering scheduled or prn medications/treatments as ordered  Take Vital Signs  Increase Vital Sign Frequency  Yellow: Q2hr x1, continue Q4hrs until patient remains green for 12hrs  Escalate  MEWS: Escalate Yellow: Discuss with charge nurse and consider notifying provider and/or RRT  Notify: Charge Nurse/RN  Name of Charge Nurse/RN Notified Benessa  Assess: SIRS CRITERIA  SIRS Temperature  0  SIRS Respirations  0  SIRS Pulse 1  SIRS WBC 0  SIRS Score Sum  1

## 2023-08-31 NOTE — Progress Notes (Signed)
 PROGRESS NOTE    Jenna Franklin   FMW:[id number] DOB: [birth year]  DOA: 08/26/2023 Date of Service: 08/31/23 which is hospital day 5  PCP: Justus Leita DEL, MD    Hospital course / significant events:   HPI: Jenna Franklin is an 83 year old female with history of liver cirrhosis with splenomegaly and esophageal varices, anxiety, GERD, who presents emergency department for chief concerns of fall with resulting right hip pain.  07/10: admitted to hospitalist service. Underwent R hip fracture repair 07/11: PT/OT recs for SNF.  Discussion today with family regarding colon mass and options re further w/u  07/12: stable, await SNF thru the weekend 07/13: pt remains confused. Tachycardic into this afternoon worsening, dx new PE started on heparin   07/14: remains on heparin . Echo no RV strain. Ascites appears a bit worse today. Oncology consult - Dr Babara and I spoke w/ daughter [name], considering defer aggressive workup/treatment and focus on palliative/hospice, [name] will discuss further w/ family. At this time pt encephalopathic d/t hyperammonemia +/- underlying MCI/dementia and unable to participate in decisions.  07/15: ammonia normalized, pt still confused. Intermittently tachycardic, remains on heparin . Family seems to have definitively decided on NO biopsy as this will not really change course of care. Palliative consult ordered - plan at this point to treat the treatable, pursue rehab to hopefully at least get patients stronger / enhance QOL. Prognosis difficult - no real utility to PET and no real way to determine how quickly patient might decline. Still confused but a bit better - family would like to wait until tomorrow when pt's sone is getting into town to tall her about the mass etc.      Consultants:  Orthopedics  HemOnc  Procedures/Surgeries: Right Hip Intramedullary nail repair 08/26/2023 w/ Dr Lorelle      ASSESSMENT & PLAN:   Right femoral fracture Secondary to  fall S/p Right Hip Intramedullary nail repair 08/26/2023 w/ Dr Lorelle Orthopedic surgery following Pain control WBAT  Fall precaution PT, OT Follow up with [facility] in 2 weeks SNF rehab   Pulmonary Embolus No RV strain on echo Heparin  gtt for now, transition to DOAC on discharge  Colonic mass with infiltration into omentum, likely malignancy Pulmonary nodule, malignancy cannot be excluded Weight loss Given CT read of colonic mass with omental implants and small pulmonary nodules, high suspicion for malignancy and metastatic disease cannot be excluded at this time Pt's son and daughter ask to defer conversation w/ patient for now until more lucid, pt certainly lacks capacity at this time to participate in discussions and decision making - plan talk w/ her about it tomorrow afternoon 09/01/23 when son can be here Dr Jinny GI spoke 07/11 with patient's daughter, holding off on colonoscopy for now and considering overall frailty and other comorbidities may not be a good candidate for aggressive workup / treatment but would recommend further discussion with oncology team Oncology consult 07/14 option for biopsy mass to help dx/prognostication but overall pt is poor candidate for treatments  See below - GOC   Cirrhosis complicated by Esophageal varices Ascites progressing on exam Appears compensated at this time and not in acute exacerbation Restart beta blocker  Caution re varices on heparin  gtt for PE  Consider for CT abd/pelv w/ contrast - possible liver mets w/ worsening ascites? Defer to oncology  Monitor CMP and CBC closely  Ascites appears a bit worse also may need to consult IR, family deciding how aggressive to be vs monitor   Acute  metabolic encephalopathy d/t acute illness / SIRS / hepatic encephalopathy Underlying cognitive impairment suspect possibly dementia Pt does not have capacity to understand her medical situation / make complex decisions at this time  Lactulose  and  monitor CMP/Ammonia  Consider brain imaging per oncology Daughter and son are involved in her decisions, daughter has been the point of contact which son is in agreement with   For now we are not informing patient of mass/cancer, per family request  Goals of care Treat what can be treated Family discussing but at this point plan to defer biopsy/cancer workup and anticipate eventual decline from this but treat the treatable in the meantime Palliative care pending   Anxiety PDMP reviewed. currently no active prescription outpatient Prn ativan  as needed   Constipation, chronic  Bowel regimen per orders Also lactulose  for hyperammonemia   Urinary retention  Foley   No concerns based on BMI: Body mass index is 22.07 kg/m.SABRA Significantly low or high BMI is associated with higher medical risk.  Underweight - under 18  overweight - 25 to 29 obese - 30 or more Class 1 obesity: BMI of 30.0 to 34 Class 2 obesity: BMI of 35.0 to 39 Class 3 obesity: BMI of 40.0 to 49 Super Morbid Obesity: BMI 50-59 Super-super Morbid Obesity: BMI 60+ Healthy nutrition and physical activity advised as adjunct to other disease management and risk reduction treatments    DVT prophylaxis: Lovenox  IV fluids: no continuous IV fluids  Nutrition: Regular diet Central lines / other devices: None  Code Status: Full code ACP documentation reviewed:  none on file in [facility]  [facility] needs: SNF placement for rehab Medical barriers to dispo: New PE, onc consult, see above              Subjective / Brief ROS:  Remains confused  Denies CP/SOB.    Family Communication: long discussion this afternoon w/ daughter [name] and her husband. Plan for family meeting tomorrow when son Chyrl can be here - see notes above.    Objective Findings:  Vitals:   08/30/23 1927 08/31/23 0349 08/31/23 0755 08/31/23 1501  BP: (!) 126/58 (!) 132/58 136/72 117/69  Pulse: 84 88 (!) 103 (!) 106  Resp: 17 18 16 20   Temp:  97.8 F (36.6 C) 97.8 F (36.6 C) 97.8 F (36.6 C) 98.2 F (36.8 C)  TempSrc: Oral Oral    SpO2: 99% 100% 97% 97%  Weight:      Height:        Intake/Output Summary (Last 24 hours) at 08/31/2023 1852 Last data filed at 08/31/2023 1526 Gross per 24 hour  Intake 729.34 ml  Output 750 ml  Net -20.66 ml   Filed Weights   08/26/23 1143  Weight: 56.5 kg    Examination:  Physical Exam Constitutional:      General: [name]. Cardiovascular:     Rate and Rhythm: Normal rate and regular rhythm.  Pulmonary:     Effort: Pulmonary effort is normal. No respiratory distress.     Breath sounds: Normal breath sounds.  Abdominal:     General: Bowel sounds are normal. There is distension.     Palpations: Abdomen is soft.     Tenderness: There is no abdominal tenderness.  Skin:    General: Skin is warm and dry.     Coloration: Skin is not pale.  Neurological:     General: No focal deficit present.     Mental Status: She is alert.  She is disoriented.     Comments: Oriented to place only after she reads the whiteboard, states she remembers who I am but cannot tell me what my job/role is, not oriented to year/month, not oriented to situation. Same as yesterday. Answering yes to almost all questions   Psychiatric:        Mood and Affect: Mood normal.        Behavior: Behavior normal.          Scheduled Medications:   Chlorhexidine  Gluconate Cloth  6 each Topical Daily   docusate sodium   100 mg Oral BID   feeding supplement  237 mL Oral BID BM   lactulose   20 g Oral BID   multivitamin with minerals  1 tablet Oral Daily   nadolol   20 mg Oral Daily   polyethylene glycol  17 g Oral BID    Continuous Infusions:  heparin  1,000 Units/hr (08/31/23 1526)     PRN Medications:  acetaminophen , bisacodyl , HYDROcodone -acetaminophen , labetalol , LORazepam , menthol -cetylpyridinium **OR** phenol, metoCLOPramide  **OR** metoCLOPramide  (REGLAN ) injection, morphine   injection, ondansetron  **OR** ondansetron  (ZOFRAN ) IV  Antimicrobials from admission:  Anti-infectives (From admission, onward)    Start     Dose/Rate Route Frequency Ordered Stop   08/26/23 2300  ceFAZolin  (ANCEF ) IVPB 2g/100 mL premix        2 g 200 mL/hr over 30 Minutes Intravenous Every 6 hours 08/26/23 2015 08/27/23 1059   08/26/23 1545  ceFAZolin  (ANCEF ) IVPB 2g/100 mL premix        2 g 200 mL/hr over 30 Minutes Intravenous  Once 08/26/23 1544 08/26/23 1628           Data Reviewed:  I have personally reviewed the following...  CBC: Recent Labs  Lab 08/26/23 1146 08/27/23 0456 08/28/23 1045 08/30/23 1025 08/31/23 0515  WBC 6.5 5.7 9.3 3.7* 2.7*  HGB 10.6* 9.0* 9.9* 8.9* 8.4*  HCT 32.7* 28.1* 31.1* 27.1* 26.1*  MCV 90.6 89.8 91.5 88.3 89.7  PLT 142* 112* 189 141* 117*   Basic Metabolic Panel: Recent Labs  Lab 08/26/23 1146 08/27/23 0456 08/28/23 1045 08/30/23 1025 08/31/23 1233  NA 136 137 133* 134* 136  K 3.7 3.6 3.7 4.0 3.7  CL 105 104 100 105 104  CO2 20* 22 24 22  20*  GLUCOSE 219* 190* 200* 215* 168*  BUN 8 9 11 12 12   CREATININE 0.64 0.65 0.68 0.58 0.41*  CALCIUM 8.2* 8.3* 8.9 8.3* 8.2*   GFR: Estimated Creatinine Clearance: 44.1 mL/min (A) (by C-G formula based on SCr of 0.41 mg/dL (L)). Liver Function Tests: Recent Labs  Lab 08/26/23 1146 08/30/23 1025 08/31/23 1233  AST 40 49* 48*  ALT 17 18 16   ALKPHOS 59 70 79  BILITOT 1.6* 1.1 1.1  PROT 6.5 6.2* 6.0*  ALBUMIN 2.9* 2.7* 2.6*   No results for input(s): LIPASE, AMYLASE in the last 168 hours. Recent Labs  Lab 08/26/23 1242 08/30/23 1025 08/31/23 1302  AMMONIA 23 86* 28   Coagulation Profile: Recent Labs  Lab 08/29/23 2250  INR 1.3*   Cardiac Enzymes: Recent Labs  Lab 08/26/23 1146  CKTOTAL 45   BNP (last 3 results) No results for input(s): PROBNP in the last 8760 hours. HbA1C: No results for input(s): HGBA1C in the last 72 hours. CBG: No results for  input(s): GLUCAP in the last 168 hours. Lipid Profile: No results for input(s): CHOL, HDL, LDLCALC, TRIG, CHOLHDL, LDLDIRECT in the last 72 hours. Thyroid  Function Tests: No results for input(s): TSH, T4TOTAL,  FREET4, T3FREE, THYROIDAB in the last 72 hours. Anemia Panel: No results for input(s): VITAMINB12, FOLATE, FERRITIN, TIBC, IRON, RETICCTPCT in the last 72 hours. Most Recent Urinalysis On File:     Component Value Date/Time   COLORURINE STRAW (A) 04/21/2019 1455   APPEARANCEUR CLEAR 04/21/2019 1455   LABSPEC 1.010 04/21/2019 1455   PHURINE 6.0 04/21/2019 1455   GLUCOSEU NEGATIVE 04/21/2019 1455   HGBUR NEGATIVE 04/21/2019 1455   BILIRUBINUR neg 09/06/2019 0833   KETONESUR NEGATIVE 04/21/2019 1455   PROTEINUR Negative 09/06/2019 0833   PROTEINUR NEGATIVE 04/21/2019 1455   UROBILINOGEN 0.2 09/06/2019 0833   NITRITE neg 09/06/2019 0833   NITRITE NEGATIVE 04/21/2019 1455   LEUKOCYTESUR Negative 09/06/2019 0833   LEUKOCYTESUR NEGATIVE 04/21/2019 1455   Sepsis Labs: @LABRCNTIP (procalcitonin:4,lacticidven:4) Microbiology: No results found for this or any previous visit (from the past 240 hours).    Radiology Studies last 3 days: ECHOCARDIOGRAM COMPLETE Result Date: 08/31/2023    ECHOCARDIOGRAM REPORT   Patient Name:   Genasis MUNN Delap Date of Exam: 08/30/2023 Medical Rec #:  [id number]          Height:       63.0 in Accession #:    [id number]         Weight:       124.6 lb Date of Birth:  02/16/1941          BSA:          1.581 m Patient Age:    83 years           BP:           105/58 mmHg Patient Gender: F                  HR:           82 bpm. Exam Location:  [facility] Procedure: 2D Echo, Cardiac Doppler and Color Doppler (Both Spectral and Color            Flow Doppler were utilized during procedure). Indications:     Pulmonary embolus I26.09  History:         Patient has prior history of Echocardiogram examinations, most                  recent  11/05/2005. CHF; Risk Factors:Hypertension. Anxiety.  Sonographer:     Christopher Furnace Referring Phys:  [id number] JAN A MANSY Diagnosing Phys: Dwayne D Callwood MD  Sonographer Comments: Technically challenging study due to limited acoustic windows, no apical window and no subcostal window. IMPRESSIONS  1. Left ventricular ejection fraction, by estimation, is 55 to 60%. The left ventricle has normal function. The left ventricle has no regional wall motion abnormalities. Left ventricular diastolic function could not be evaluated.  2. Right ventricular systolic function is normal. The right ventricular size is normal.  3. The mitral valve is normal in structure. Trivial mitral valve regurgitation.  4. The aortic valve is normal in structure. Aortic valve regurgitation is not visualized. FINDINGS  Left Ventricle: Left ventricular ejection fraction, by estimation, is 55 to 60%. The left ventricle has normal function. The left ventricle has no regional wall motion abnormalities. Strain was performed and the global longitudinal strain is indeterminate. The left ventricular internal cavity size was normal in size. There is borderline left ventricular hypertrophy. Left ventricular diastolic function could not be evaluated. Right Ventricle: The right ventricular size is normal. No increase in right ventricular wall thickness. Right ventricular systolic function is normal. Left Atrium:  Left atrial size was normal in size. Right Atrium: Right atrial size was normal in size. Pericardium: There is no evidence of pericardial effusion. Mitral Valve: The mitral valve is normal in structure. Trivial mitral valve regurgitation. Tricuspid Valve: The tricuspid valve is normal in structure. Tricuspid valve regurgitation is mild. Aortic Valve: The aortic valve is normal in structure. Aortic valve regurgitation is not visualized. Pulmonic Valve: The pulmonic valve was grossly normal. Pulmonic valve regurgitation is not visualized. Aorta: The  ascending aorta was not well visualized. IAS/Shunts: No atrial level shunt detected by color flow Doppler. Additional Comments: 3D was performed not requiring image post processing on an independent workstation and was indeterminate.  LEFT VENTRICLE PLAX 2D LVIDd:         3.90 cm LVIDs:         2.80 cm LV PW:         1.00 cm LV IVS:        1.10 cm LVOT diam:     2.00 cm LVOT Area:     3.14 cm  LEFT ATRIUM         Index LA diam:    3.30 cm 2.09 cm/m   AORTA Ao Root diam: 2.90 cm  SHUNTS Systemic Diam: 2.00 cm Cara JONETTA Lovelace MD Electronically signed by Cara JONETTA Lovelace MD Signature Date/Time: 08/31/2023/7:29:38 AM    Final    CT Angio Chest Pulmonary Embolism (PE) W or WO Contrast Addendum Date: 08/29/2023 ADDENDUM REPORT: 08/29/2023 23:09 ADDENDUM: These results were called by telephone at the time of interpretation on 08/29/2023 at 10:11 p.m. to provider Dr. JINNY Peaches, who verbally acknowledged these results. Electronically Signed   By: Greig Pique M.D.   On: 08/29/2023 23:09   Result Date: 08/29/2023 CLINICAL DATA:  High probability for PE EXAM: CT ANGIOGRAPHY CHEST WITH CONTRAST TECHNIQUE: Multidetector CT imaging of the chest was performed using the standard protocol during bolus administration of intravenous contrast. Multiplanar CT image reconstructions and MIPs were obtained to evaluate the vascular anatomy. RADIATION DOSE REDUCTION: This exam was performed according to the departmental dose-optimization program which includes automated exposure control, adjustment of the mA and/or kV according to patient size and/or use of iterative reconstruction technique. CONTRAST:  75mL OMNIPAQUE  IOHEXOL  350 MG/ML SOLN COMPARISON:  CT of the chest abdomen and pelvis 08/26/2023. the FINDINGS: Cardiovascular: There are segmental and subsegmental pulmonary emboli in the right lower lobe. No other pulmonary emboli are seen. The heart and aorta are normal in size. There is no pericardial effusion. There are  atherosclerotic calcifications of the aorta. There is an aberrant right subclavian artery. Mediastinum/Nodes: No enlarged mediastinal, hilar, or axillary lymph nodes. Thyroid  gland, trachea, and esophagus demonstrate no significant findings. Lungs/Pleura: There are trace bilateral pleural effusions with a small amount of atelectasis in the bilateral lower lobes. There is a 2 mm nodule in the superior segment of the left lower lobe image 6/40. There is a stable right middle lobe nodule measuring 7 mm. The lungs are otherwise clear. Upper Abdomen: There is heterogeneous nodular liver contour. The spleen is enlarged. There is a moderate amount of ascites in the upper abdomen. Musculoskeletal: No acute fractures are identified. T12 chronic compression deformity is unchanged. Review of the MIP images confirms the above findings. IMPRESSION: 1. Segmental and subsegmental pulmonary emboli in the right lower lobe. There is evidence for right heart strain (RV/LV Ratio = 1.2) consistent with at least submassive (intermediate risk) PE. The presence of right heart strain has been  associated with an increased risk of morbidity and mortality. 2. Trace bilateral pleural effusions with small amount of atelectasis in the lower lobes. 3. Stable 7 mm right middle lobe nodule. 2 mm left lower lobe nodule. 4. Cirrhotic liver with splenomegaly and moderate ascites in the upper abdomen. Aortic Atherosclerosis (ICD10-I70.0). Electronically Signed: By: Greig Pique M.D. On: 08/29/2023 22:02       Time spent: 50 min     Laneta Blunt, DO [facility] 08/31/2023, 6:52 PM    Dictation software may have been used to generate the above note. Typos may occur and escape review in typed/dictated notes. Please contact Dr Blunt directly for clarity if needed.  Staff may message me via secure chat in Epic  but this may not receive an immediate response,  please page me for urgent matters!  If 7PM-7AM, please  contact night coverage [email or web address]

## 2023-08-31 NOTE — Consult Note (Signed)
 Pharmacy Consult Note - Anticoagulation  Pharmacy Consult for heparin  Indication: pulmonary embolus  PATIENT MEASUREMENTS: Height: 5' 3 (160 cm) Weight: 56.5 kg (124 lb 9.6 oz) IBW/kg (Calculated) : 52.4 HEPARIN  DW (KG): 56.5  VITAL SIGNS: Temp: 97.8 F (36.6 C) (07/15 0349) Temp Source: Oral (07/15 0349) BP: 132/58 (07/15 0349) Pulse Rate: 88 (07/15 0349)  Recent Labs    08/29/23 2250 08/30/23 0740 08/30/23 1025 08/30/23 1543 08/31/23 0515  HGB  --   --  8.9*  --  8.4*  HCT  --   --  27.1*  --  26.1*  PLT  --   --  141*  --  117*  APTT 32  --   --   --   --   LABPROT 17.2*  --   --   --   --   INR 1.3*  --   --   --   --   HEPARINUNFRC  --    < >  --    < > 0.30  CREATININE  --   --  0.58  --   --    < > = values in this interval not displayed.    Estimated Creatinine Clearance: 44.1 mL/min (by C-G formula based on SCr of 0.58 mg/dL).  PAST MEDICAL HISTORY: Past Medical History:  Diagnosis Date   Adjustment disorder with anxious mood 10/05/2013   Anxiety    Arthritis    back   Bronchitis    currently   Calculus of kidney 03/25/2015   CHF (congestive heart failure) (HCC)    pt says she thinks she has this   Cirrhosis of liver (HCC) 2016   Depression    Esophageal varices (HCC)    Esophageal varices (HCC)    Gastritis without bleeding 2019   GERD (gastroesophageal reflux disease)    H/O: depression    History of kidney stones    Hx of sinus tachycardia 07/06/2017   Hypertension    Nerve root pain 12/11/2011   Non-alcoholic fatty liver disease    Nonalcoholic fatty liver disease    Osteopenia    Osteopenia    Reflux    in past   Spinal stenosis    Tachycardia 03/25/2015   Tendinopathy of right gluteus medius 01/01/2017   Vertigo    3 yrs ago   Vitamin D deficiency    Vitamin D deficiency     ASSESSMENT: 83 y.o. female with PMH including cirrhosis with ascites, esophageal varices, colonic mass concerning for malignancy is presenting with right  femoral fracture and pulmonary emboli. CT chest demonstrates pulmonary emboli in RLL with evidence for right heart strain. Patient is not on chronic anticoagulation per chart review but has received prophylactic enoxaparin  the past three days. Pharmacy has been consulted to initiate and manage heparin  intravenous infusion.  Pertinent medications: Enoxaparin  40mg , last administered 7/13 @ 0919  Date Time HL Rate/Comment 07/14 0740 0.43 Therapeutic x 1; 900 un/hr 07/14 1543 0.32 Therapeutic x 2; 900 un/hr 07/15   0515    0.30     Therapeutic X 3; 900 un/hr   Goal(s) of therapy: Heparin  level 0.3 - 0.7 units/mL Monitor platelets by anticoagulation protocol: Yes   Baseline anticoagulation labs: Recent Labs    08/28/23 1045 08/29/23 2250 08/30/23 1025 08/31/23 0515  APTT  --  32  --   --   INR  --  1.3*  --   --   HGB 9.9*  --  8.9* 8.4*  PLT 189  --  141* 117*   Baseline aPTT and INR have been ordered  PLAN: 7/15:  HL @ 0515 = 0.30, therapeutic X 3 - HL is therapeutic but on lowest end of range and has been trending down steadily - will increase heparin  drip rate to 1000 units/hr and recheck HL on 7/16 with AM labs Monitor CBC daily while on heparin  infusion  Thank you for involving pharmacy in this patient's care.   Lucianna Ostlund D 08/31/2023 6:45 AM

## 2023-08-31 NOTE — Plan of Care (Signed)
  Problem: Education: Goal: Knowledge of General Education information will improve Description: Including pain rating scale, medication(s)/side effects and non-pharmacologic comfort measures Outcome: Not Progressing   Problem: Activity: Goal: Risk for activity intolerance will decrease Outcome: Not Progressing   Problem: Coping: Goal: Level of anxiety will decrease Outcome: Not Progressing   Problem: Elimination: Goal: Will not experience complications related to bowel motility Outcome: Progressing

## 2023-09-01 DIAGNOSIS — S72001A Fracture of unspecified part of neck of right femur, initial encounter for closed fracture: Secondary | ICD-10-CM | POA: Diagnosis not present

## 2023-09-01 LAB — CBC
HCT: 25.5 % — ABNORMAL LOW (ref 36.0–46.0)
Hemoglobin: 8.2 g/dL — ABNORMAL LOW (ref 12.0–15.0)
MCH: 29 pg (ref 26.0–34.0)
MCHC: 32.2 g/dL (ref 30.0–36.0)
MCV: 90.1 fL (ref 80.0–100.0)
Platelets: 105 K/uL — ABNORMAL LOW (ref 150–400)
RBC: 2.83 MIL/uL — ABNORMAL LOW (ref 3.87–5.11)
RDW: 16.9 % — ABNORMAL HIGH (ref 11.5–15.5)
WBC: 2.8 K/uL — ABNORMAL LOW (ref 4.0–10.5)
nRBC: 0 % (ref 0.0–0.2)

## 2023-09-01 LAB — COMPREHENSIVE METABOLIC PANEL WITH GFR
ALT: 18 U/L (ref 0–44)
AST: 42 U/L — ABNORMAL HIGH (ref 15–41)
Albumin: 2.4 g/dL — ABNORMAL LOW (ref 3.5–5.0)
Alkaline Phosphatase: 83 U/L (ref 38–126)
Anion gap: 6 (ref 5–15)
BUN: 14 mg/dL (ref 8–23)
CO2: 24 mmol/L (ref 22–32)
Calcium: 8.2 mg/dL — ABNORMAL LOW (ref 8.9–10.3)
Chloride: 107 mmol/L (ref 98–111)
Creatinine, Ser: 0.55 mg/dL (ref 0.44–1.00)
GFR, Estimated: >60 mL/min (ref >60)
Glucose, Bld: 181 mg/dL — ABNORMAL HIGH (ref 70–99)
Potassium: 3.4 mmol/L — ABNORMAL LOW (ref 3.5–5.1)
Sodium: 137 mmol/L (ref 135–145)
Total Bilirubin: 1.1 mg/dL (ref 0.0–1.2)
Total Protein: 5.3 g/dL — ABNORMAL LOW (ref 6.5–8.1)

## 2023-09-01 LAB — AFP TUMOR MARKER: AFP, Serum, Tumor Marker: 2.9 ng/mL (ref 0.0–8.7)

## 2023-09-01 LAB — CEA: CEA: 4.4 ng/mL (ref 0.0–4.7)

## 2023-09-01 LAB — HEPARIN LEVEL (UNFRACTIONATED): Heparin Unfractionated: 0.48 [IU]/mL (ref 0.30–0.70)

## 2023-09-01 LAB — AMMONIA: Ammonia: 36 umol/L — ABNORMAL HIGH (ref 9–35)

## 2023-09-01 MED ORDER — SODIUM CHLORIDE 0.9% FLUSH: 10.0000 mL | INTRAVENOUS | Status: DC | PRN

## 2023-09-01 MED ORDER — POTASSIUM CHLORIDE CRYS ER 20 MEQ PO TBCR
40.0000 meq | EXTENDED_RELEASE_TABLET | Freq: Once | ORAL | Status: AC
  Administered 2023-09-01: 40 meq via ORAL
  Filled 2023-09-01: qty 2

## 2023-09-01 NOTE — Progress Notes (Signed)
 Occupational Therapy Treatment Patient Details Name: Jenna Franklin MRN: [id number] DOB: 1940-04-30 Today's Date: 09/01/2023   History of present illness Jenna Franklin is an 83 year old female with history of liver cirrhosis with splenomegaly and esophageal varices, anxiety, GERD, who presents emergency department for chief concerns of fall with resulting right hip pain. S/p R IM nailing 08/26/23.   OT comments  Pt presents with fatigue and confusion. Alert to self only, keeps eyes closed throughout most of session (worked with PT prior to OT tx). Pt requires MAX A for bed level grooming and anticipate will continue to require MAX - TOTAL A for bed level ADLs at this time. Generally unable to meaningfully participate given anxiety and pain with minimal movement (pt crying out even with movements of bed). Pt making gradual progress towards goals, OT will continue to follow.       If plan is discharge home, recommend the following:  Two people to help with walking and/or transfers;A lot of help with bathing/dressing/bathroom;Assistance with cooking/housework;Assist for transportation;Supervision due to cognitive status;Direct supervision/assist for medications management;Direct supervision/assist for financial management;Help with stairs or ramp for entrance   Equipment Recommendations  Other (comment)       Precautions / Restrictions Precautions Precautions: Fall Recall of Precautions/Restrictions: Impaired Restrictions Weight Bearing Restrictions Per Provider Order: Yes RLE Weight Bearing Per Provider Order: Weight bearing as tolerated       Mobility Bed Mobility Overal bed mobility: Needs Assistance Bed Mobility: Rolling Rolling: +2 for physical assistance, Total assist, Max assist              Transfers                   General transfer comment: NT     Balance       Sitting balance - Comments: NT                                   ADL  either performed or assessed with clinical judgement   ADL Overall ADL's : Needs assistance/impaired     Grooming: Wash/dry face;Bed level;Total assistance Grooming Details (indicate cue type and reason): pt fatigued, does not attempt to initiate washing face                               General ADL Comments: Further ADLs not attempted due to fatigue     Communication Communication Communication: No apparent difficulties   Cognition Arousal: Lethargic Behavior During Therapy: Anxious Cognition: No family/caregiver present to determine baseline             OT - Cognition Comments: barely opens eyes (had just finished with PT prior), would fall asleep mid-sentence, pleasant but confused                 Following commands: Impaired Following commands impaired: Follows one step commands inconsistently, Follows one step commands with increased time      Cueing   Cueing Techniques: Verbal cues, Tactile cues, Gestural cues, Visual cues        General Comments HR up to 110 at rest. RN notified.    Pertinent Vitals/ Pain       Pain Assessment Pain Assessment: Faces Faces Pain Scale: Hurts little more Pain Location: calling out with any movement of bed Pain Descriptors / Indicators: Sore Pain Intervention(s): Limited activity within patient's  tolerance, Monitored during session, Repositioned   Frequency  Min 2X/week        Progress Toward Goals  OT Goals(current goals can now be found in the care plan section)  Progress towards OT goals: Progressing toward goals  Acute Rehab OT Goals OT Goal Formulation: With patient Time For Goal Achievement: 09/10/23 Potential to Achieve Goals: Good ADL Goals Pt Will Perform Grooming: sitting;with supervision Pt Will Perform Upper Body Dressing: with supervision;sitting Pt Will Perform Lower Body Dressing: with min assist;with adaptive equipment;sitting/lateral leans;sit to/from stand Pt Will Transfer to  Toilet: bedside commode;ambulating;with contact guard assist;with min assist;grab bars Pt Will Perform Toileting - Clothing Manipulation and hygiene: with supervision;sit to/from stand;sitting/lateral leans  Plan         AM-PAC OT 6 Clicks Daily Activity     Outcome Measure   Help from another person eating meals?: None Help from another person taking care of personal grooming?: A Little Help from another person toileting, which includes using toliet, bedpan, or urinal?: A Lot Help from another person bathing (including washing, rinsing, drying)?: A Lot Help from another person to put on and taking off regular upper body clothing?: A Little Help from another person to put on and taking off regular lower body clothing?: A Lot 6 Click Score: 16    End of Session    OT Visit Diagnosis: Unsteadiness on feet (R26.81);History of falling (Z91.81);Other abnormalities of gait and mobility (R26.89);Muscle weakness (generalized) (M62.81);Pain Pain - Right/Left: Right Pain - part of body: Hip;Leg   Activity Tolerance Patient limited by fatigue   Patient Left in bed;with call bell/phone within reach;with bed alarm set   Nurse Communication Mobility status (HR, fatigue/lethargy)        Time: 8842-8789 OT Time Calculation (min): 13 min  Charges: OT General Charges $OT Visit: 1 Visit OT Treatments $Self Care/Home Management : 8-22 mins  Tyge Somers L. Giulia Hickey, OTR/L  09/01/23, 4:58 PM

## 2023-09-01 NOTE — Plan of Care (Signed)
  Problem: Education: Goal: Knowledge of General Education information will improve Description: Including pain rating scale, medication(s)/side effects and non-pharmacologic comfort measures Outcome: Not Met (add Reason)   Problem: Clinical Measurements: Goal: Ability to maintain clinical measurements within normal limits will improve Outcome: Progressing   Problem: Activity: Goal: Risk for activity intolerance will decrease Outcome: Progressing

## 2023-09-01 NOTE — Progress Notes (Addendum)
 Progress Note    Jenna Franklin  FMW:[id number] DOB: 1941-02-14  DOA: 08/26/2023 PCP: Justus Leita DEL, MD      Brief Narrative:    Medical records reviewed and are as summarized below:   Hospital course / significant events:   HPI: [name] is an 83 year old female with history of liver cirrhosis with splenomegaly and esophageal varices, anxiety, GERD, who presents emergency department for chief concerns of fall with resulting right hip pain.  07/10: admitted to hospitalist service. Underwent R hip fracture repair 07/11: PT/OT recs for SNF.  Discussion today with family regarding colon mass and options re further w/u  07/12: stable, await SNF thru the weekend 07/13: pt remains confused. Tachycardic into this afternoon worsening, dx new PE started on heparin   07/14: remains on heparin . Echo no RV strain. Ascites appears a bit worse today. Oncology consult - Dr Babara and I spoke w/ daughter [name], considering defer aggressive workup/treatment and focus on palliative/hospice, [name] will discuss further w/ family. At this time pt encephalopathic d/t hyperammonemia +/- underlying MCI/dementia and unable to participate in decisions.  07/15: ammonia normalized, pt still confused. Intermittently tachycardic, remains on heparin . Family seems to have definitively decided on NO biopsy as this will not really change course of care. Palliative consult ordered - plan at this point to treat the treatable, pursue rehab to hopefully at least get patients stronger / enhance QOL. Prognosis difficult - no real utility to PET and no real way to determine how quickly patient might decline. Still confused but a bit better - family would like to wait until tomorrow when pt's sone is getting into town to tall her about the mass etc.          Assessment/Plan:   Principal Problem:   Right femoral fracture (HCC) Active Problems:   Anxiety   Acid reflux   Cirrhosis of liver with ascites  (HCC)   Esophageal varices in cirrhosis (HCC)   Colonic mass   Weight loss   Protein-calorie malnutrition, severe   Closed fracture of right hip (HCC)   Hepatic encephalopathy (HCC)   Nutrition Problem: Severe Malnutrition Etiology: chronic illness (cirrhosis)  Signs/Symptoms: percent weight loss, moderate fat depletion, severe fat depletion, moderate muscle depletion, severe muscle depletion   Body mass index is 22.07 kg/m.     Right femoral fracture Secondary to fall S/p Right Hip Intramedullary nail repair 08/26/2023 w/ Dr Lorelle Outpatient follow-up with [facility] orthopedics 2 weeks postop Analgesics as needed. PT recommended discharge to SNF   Pulmonary Embolus No RV strain on echo Continue IV heparin  drip and monitor heparin  level per protocol.   Colonic mass with infiltration into omentum, likely malignancy Pulmonary nodule, malignancy cannot be excluded Weight loss Given CT read of colonic mass with omental implants and small pulmonary nodules, high suspicion for malignancy and metastatic disease cannot be excluded at this time Pt's son and daughter ask to defer conversation w/ patient for now until more lucid, pt certainly lacks capacity at this time to participate in discussions and decision making  Dr Jinny GI spoke 07/11 with patient's daughter, holding off on colonoscopy for now and considering overall frailty and other comorbidities may not be a good candidate for aggressive workup / treatment but would recommend further discussion with oncology team Oncology consult 07/14 option for biopsy mass to help dx/prognostication but overall pt is poor candidate for treatments      Cirrhosis complicated by Esophageal varices Ascites  Continue  nadolol  Caution re varices on heparin  gtt for PE  Monitor CMP and CBC closely    Acute metabolic encephalopathy d/t acute illness / SIRS / hepatic encephalopathy Underlying cognitive impairment suspect possibly  dementia Pt does not have capacity to understand her medical situation / make complex decisions at this time  Continue lactulose  Consider brain imaging per oncology Daughter and son are involved in her decisions, daughter has been the point of contact which son is in agreement with   For now we are not informing patient of mass/cancer, per family request  Worsening anemia Chronic thrombocytopenia No evidence of bleeding for.  Monitor CBC   Anxiety Ativan  as needed  Constipation, chronic  Continue laxatives as needed Also lactulose  for hyperammonemia    Urinary retention  Foley catheter in place   Goals of care Treat what can be treated Family discussing but at this point plan to defer biopsy/cancer workup and anticipate eventual decline from this but treat the treatable in the meantime Palliative care pending       Diet Order             Diet regular Room service appropriate? Yes; Fluid consistency: Thin  Diet effective now                            Consultants: Magazine features editor Orthopedic surgeon Palliative care  Procedures: Right Hip Intramedullary nail repair 08/26/2023 w/ Dr Lorelle    Medications:    Chlorhexidine  Gluconate Cloth  6 each Topical Daily   docusate sodium   100 mg Oral BID   feeding supplement  237 mL Oral BID BM   lactulose   20 g Oral BID   multivitamin with minerals  1 tablet Oral Daily   nadolol   20 mg Oral Daily   polyethylene glycol  17 g Oral BID   Continuous Infusions:  heparin  1,000 Units/hr (09/01/23 1601)     Anti-infectives (From admission, onward)    Start     Dose/Rate Route Frequency Ordered Stop   08/26/23 2300  ceFAZolin  (ANCEF ) IVPB 2g/100 mL premix        2 g 200 mL/hr over 30 Minutes Intravenous Every 6 hours 08/26/23 2015 08/27/23 1059   08/26/23 1545  ceFAZolin  (ANCEF ) IVPB 2g/100 mL premix        2 g 200 mL/hr over 30 Minutes Intravenous  Once 08/26/23 1544 08/26/23 1628               Family Communication/Anticipated D/C date and plan/Code Status   DVT prophylaxis: SCDs Start: 08/26/23 2015 Place TED hose Start: 08/26/23 1551     Code Status: Full Code  Family Communication: None Disposition Plan: Plan to discharge to SNF   Status is: Inpatient Remains inpatient appropriate because: Acute pulmonary embolism       Subjective:   Interval events noted.  She complains of feeling tired no other complaints.  Objective:    Vitals:   09/01/23 0128 09/01/23 0526 09/01/23 0803 09/01/23 1521  BP: 98/60 109/65 114/63 (!) 124/58  Pulse: 99 98 98 100  Resp: 19 19 16 16   Temp: 97.7 F (36.5 C) 97.8 F (36.6 C) (!) 97.5 F (36.4 C) 98.5 F (36.9 C)  TempSrc: Oral Oral Oral Oral  SpO2: 98% 98% 99% 100%  Weight:      Height:       No data found.   Intake/Output Summary (Last 24 hours) at 09/01/2023 1637 Last data filed  at 09/01/2023 1601 Gross per 24 hour  Intake 224.86 ml  Output 250 ml  Net -25.14 ml   Filed Weights   08/26/23 1143  Weight: 56.5 kg    Exam:  GEN: NAD SKIN: Warm and dry EYES: No pallor or icterus ENT: MMM CV: RRR PULM: CTA B ABD: soft, distended, NT, +BS CNS: AAO x 2, non focal EXT: No edema or tenderness.  Right hip surgical dressing is clean, dry and intact.        Data Reviewed:   I have personally reviewed following labs and imaging studies:  Labs: Labs show the following:   Basic Metabolic Panel: Recent Labs  Lab 08/27/23 0456 08/28/23 1045 08/30/23 1025 08/31/23 1233 09/01/23 0514  NA 137 133* 134* 136 137  K 3.6 3.7 4.0 3.7 3.4*  CL 104 100 105 104 107  CO2 22 24 22  20* 24  GLUCOSE 190* 200* 215* 168* 181*  BUN 9 11 12 12 14   CREATININE 0.65 0.68 0.58 0.41* 0.55  CALCIUM 8.3* 8.9 8.3* 8.2* 8.2*   GFR Estimated Creatinine Clearance: 44.1 mL/min (by C-G formula based on SCr of 0.55 mg/dL). Liver Function Tests: Recent Labs  Lab 08/26/23 1146 08/30/23 1025 08/31/23 1233  09/01/23 0514  AST 40 49* 48* 42*  ALT 17 18 16 18   ALKPHOS 59 70 79 83  BILITOT 1.6* 1.1 1.1 1.1  PROT 6.5 6.2* 6.0* 5.3*  ALBUMIN 2.9* 2.7* 2.6* 2.4*   No results for input(s): LIPASE, AMYLASE in the last 168 hours. Recent Labs  Lab 08/26/23 1242 08/30/23 1025 08/31/23 1302 09/01/23 0517  AMMONIA 23 86* 28 36*   Coagulation profile Recent Labs  Lab 08/29/23 2250  INR 1.3*    CBC: Recent Labs  Lab 08/27/23 0456 08/28/23 1045 08/30/23 1025 08/31/23 0515 09/01/23 0514  WBC 5.7 9.3 3.7* 2.7* 2.8*  HGB 9.0* 9.9* 8.9* 8.4* 8.2*  HCT 28.1* 31.1* 27.1* 26.1* 25.5*  MCV 89.8 91.5 88.3 89.7 90.1  PLT 112* 189 141* 117* 105*   Cardiac Enzymes: Recent Labs  Lab 08/26/23 1146  CKTOTAL 45   BNP (last 3 results) No results for input(s): PROBNP in the last 8760 hours. CBG: No results for input(s): GLUCAP in the last 168 hours. D-Dimer: No results for input(s): DDIMER in the last 72 hours. Hgb A1c: No results for input(s): HGBA1C in the last 72 hours. Lipid Profile: No results for input(s): CHOL, HDL, LDLCALC, TRIG, CHOLHDL, LDLDIRECT in the last 72 hours. Thyroid  function studies: No results for input(s): TSH, T4TOTAL, T3FREE, THYROIDAB in the last 72 hours.  Invalid input(s): FREET3 Anemia work up: No results for input(s): VITAMINB12, FOLATE, FERRITIN, TIBC, IRON, RETICCTPCT in the last 72 hours. Sepsis Labs: Recent Labs  Lab 08/28/23 1045 08/30/23 1025 08/31/23 0515 09/01/23 0514  WBC 9.3 3.7* 2.7* 2.8*    Microbiology No results found for this or any previous visit (from the past 240 hours).  Procedures and diagnostic studies:  No results found.             LOS: 6 days   Kloie Whiting  [facility]   Pager on [email or web address]. If 7PM-7AM, please contact night-coverage at [email or web address]     09/01/2023, 4:37 PM

## 2023-09-01 NOTE — Progress Notes (Signed)
 Physical Therapy Treatment Patient Details Name: Lela Murfin MRN: [id number] DOB: [birth year] Today's Date: 09/01/2023   History of Present Illness Ms. Tykeisha Peer is an 83 year old female with history of liver cirrhosis with splenomegaly and esophageal varices, anxiety, GERD, who presents emergency department for chief concerns of fall with resulting right hip pain. S/p R IM nailing 08/26/23.    PT Comments  Pt pleasantly confused, willing to give some effort with participation but was pain limited, hesitant and generally unable to fully engage/follow cuing despite multimodal and repeated cues..  She showed some effort where she was able to but ultimately was quite limited.  She did show some real effort with LE exercises in supine but pt is weak and very pain limited.  We managed a brief bout of standing EOB, pt did show some insight on getting weight forward on walker, however her standing tolerance is minimal and she abruptly reported needing to sit.  Pt will benefit from continued PT to address functional limitations.     If plan is discharge home, recommend the following: Two people to help with walking and/or transfers;Two people to help with bathing/dressing/bathroom;Assistance with cooking/housework;Assist for transportation;Supervision due to cognitive status;Help with stairs or ramp for entrance   Can travel by private vehicle     No  Equipment Recommendations  Other (comment) (TBD)    Recommendations for Other Services       Precautions / Restrictions Precautions Precautions: Fall Recall of Precautions/Restrictions: Impaired Precaution/Restrictions Comments: Poor safety awareness Restrictions RLE Weight Bearing Per Provider Order: Weight bearing as tolerated     Mobility  Bed Mobility Overal bed mobility: Needs Assistance Bed Mobility: Supine to Sit Rolling: Max assist   Supine to sit: Max assist     General bed mobility comments: Pt struggled to give much  meaningful effort with bed mobility transitions    Transfers Overall transfer level: Needs assistance Equipment used: Rolling walker (2 wheels) Transfers: Sit to/from Stand Sit to Stand: Max assist, From elevated surface           General transfer comment: Pt anxious about trying, showed some effort but ultimately needed heavy assist to get to standing.  Once upright she needed only minA but tolerated just a few seconds of this before I need to sit back down now    Ambulation/Gait               General Gait Details: poor awareness and standing tolerance, unable to attempt this date   Stairs             Wheelchair Mobility     Tilt Bed    Modified Rankin (Stroke Patients Only)       Balance Overall balance assessment: Needs assistance Sitting-balance support: Feet supported, Single extremity supported Sitting balance-Leahy Scale: Fair     Standing balance support: Bilateral upper extremity supported, Reliant on assistive device for balance, During functional activity Standing balance-Leahy Scale: Poor                              Communication Communication Communication: No apparent difficulties  Cognition Arousal: Alert Behavior During Therapy: Anxious   PT - Cognitive impairments: History of cognitive impairments                       PT - Cognition Comments: Constant redirection to current situation Following commands: Impaired Following commands impaired: Follows one step commands inconsistently,  Follows one step commands with increased time    Cueing Cueing Techniques: Verbal cues, Tactile cues, Gestural cues, Visual cues  Exercises General Exercises - Lower Extremity Ankle Circles/Pumps: 5 reps, AROM Short Arc Quad: AAROM, 10 reps Heel Slides: AAROM, 10 reps (with lightly resisted leg ext) Hip ABduction/ADduction: AAROM, Right, 10 reps (limited ROM tolerance on R)    General Comments General comments (skin  integrity, edema, etc.): Pt with general confusion, pain guarded and generally struggled to fully participate with PT session      Pertinent Vitals/Pain Pain Assessment Pain Assessment: Faces Faces Pain Scale: Hurts little more Pain Location: calling out with any R LE movement    Home Living                          Prior Function            PT Goals (current goals can now be found in the care plan section) Progress towards PT goals: Progressing toward goals    Frequency    7X/week      PT Plan      Co-evaluation              AM-PAC PT 6 Clicks Mobility   Outcome Measure  Help needed turning from your back to your side while in a flat bed without using bedrails?: Total Help needed moving from lying on your back to sitting on the side of a flat bed without using bedrails?: Total Help needed moving to and from a bed to a chair (including a wheelchair)?: Total Help needed standing up from a chair using your arms (e.g., wheelchair or bedside chair)?: Total Help needed to walk in hospital room?: Total Help needed climbing 3-5 steps with a railing? : Total 6 Click Score: 6    End of Session Equipment Utilized During Treatment: Gait belt Activity Tolerance: Patient tolerated treatment well;Patient limited by pain Patient left: in bed;with bed alarm set;with call bell/phone within reach Nurse Communication: Mobility status;Other (comment) PT Visit Diagnosis: Muscle weakness (generalized) (M62.81);Difficulty in walking, not elsewhere classified (R26.2);Pain Pain - Right/Left: Right Pain - part of body: Hip     Time: 1140-1157 PT Time Calculation (min) (ACUTE ONLY): 17 min  Charges:    $Therapeutic Exercise: 8-22 mins PT General Charges $$ ACUTE PT VISIT: 1 Visit                     Carmin JONELLE Deed, DPT 09/01/2023, 1:16 PM

## 2023-09-01 NOTE — Progress Notes (Signed)

## 2023-09-01 NOTE — Progress Notes (Signed)
 Nutrition Follow-up  DOCUMENTATION CODES:   Severe malnutrition in context of chronic illness  INTERVENTION:   -Obtain new wt -Continue regular diet -Continue MVI with minerals daily -Continue Magic cup BID with meals, each supplement provides 290 kcal and 9 grams of protein  -Continue Ensure Plus High Protein po BID, each supplement provides 350 kcal and 20 grams of protein  -Continue feeding assistance with meals (may need assistance with tray set-up and opening packages)   NUTRITION DIAGNOSIS:   Severe Malnutrition related to chronic illness (cirrhosis) as evidenced by percent weight loss, moderate fat depletion, severe fat depletion, moderate muscle depletion, severe muscle depletion.  Ongoing  GOAL:   Patient will meet greater than or equal to 90% of their needs  Progressing   MONITOR:   PO intake, Supplement acceptance  REASON FOR ASSESSMENT:   Consult Assessment of nutrition requirement/status  ASSESSMENT:   Pt with history of liver cirrhosis with splenomegaly and esophageal varices, anxiety, GERD, who presents for chief concerns of fall with resulting right hip pain.  7/10- s/p Right Hip Intramedullary nail  7/14- CTA revealed PE- started on heparin   Reviewed I/O's: -601 ml x 24 hours and -158 ml since admission  UOP: 750 ml x 24 hours  Pt sleeping soundly at time of visit. No family present. Pt with periods of confusion and unable to makes her wishes known.   Pt remains on a regular diet. Noted meal completions 5-75%. She is consuming Ensure supplements.   No new wt since admission.   Per MD notes, son is coming in from out of town today to discuss goals of care.   Medications reviewed and include colace, lactulose , and miralax .   Labs reviewed: K: 3.4.    Diet Order:   Diet Order             Diet regular Room service appropriate? Yes; Fluid consistency: Thin  Diet effective now                   EDUCATION NEEDS:   Education needs  have been addressed  Skin:  Skin Assessment: Skin Integrity Issues: Skin Integrity Issues:: Incisions Incisions: closed rt hip  Last BM:  09/01/23 (type 6)  Height:   Ht Readings from Last 1 Encounters:  08/26/23 5' 3 (1.6 m)    Weight:   Wt Readings from Last 1 Encounters:  08/26/23 56.5 kg    Ideal Body Weight:  52.3 kg  BMI:  Body mass index is 22.07 kg/m.  Estimated Nutritional Needs:   Kcal:  1700-1900  Protein:  90-105 grams  Fluid:  1.7-1.9 L    Margery ORN, RD, LDN, CDCES Registered Dietitian III Certified Diabetes Care and Education Specialist If unable to reach this RD, please use RD Inpatient group chat on secure chat between hours of 8am-4 pm daily

## 2023-09-01 NOTE — Consult Note (Signed)
 Pharmacy Consult Note - Anticoagulation  Pharmacy Consult for heparin  Indication: pulmonary embolus  PATIENT MEASUREMENTS: Height: 5' 3 (160 cm) Weight: 56.5 kg (124 lb 9.6 oz) IBW/kg (Calculated) : 52.4 HEPARIN  DW (KG): 56.5  VITAL SIGNS: Temp: 97.8 F (36.6 C) (07/16 0526) Temp Source: Oral (07/16 0526) BP: 109/65 (07/16 0526) Pulse Rate: 98 (07/16 0526)  Recent Labs    08/29/23 2250 08/30/23 0740 08/31/23 1233 09/01/23 0514  HGB  --    < >  --  8.2*  HCT  --    < >  --  25.5*  PLT  --    < >  --  105*  APTT 32  --   --   --   LABPROT 17.2*  --   --   --   INR 1.3*  --   --   --   HEPARINUNFRC  --    < >  --  0.48  CREATININE  --    < > 0.41*  --    < > = values in this interval not displayed.    Estimated Creatinine Clearance: 44.1 mL/min (A) (by C-G formula based on SCr of 0.41 mg/dL (L)).  PAST MEDICAL HISTORY: Past Medical History:  Diagnosis Date   Adjustment disorder with anxious mood 10/05/2013   Anxiety    Arthritis    back   Bronchitis    currently   Calculus of kidney 03/25/2015   CHF (congestive heart failure) (HCC)    pt says she thinks she has this   Cirrhosis of liver (HCC) 2016   Depression    Esophageal varices (HCC)    Esophageal varices (HCC)    Gastritis without bleeding 2019   GERD (gastroesophageal reflux disease)    H/O: depression    History of kidney stones    Hx of sinus tachycardia 07/06/2017   Hypertension    Nerve root pain 12/11/2011   Non-alcoholic fatty liver disease    Nonalcoholic fatty liver disease    Osteopenia    Osteopenia    Reflux    in past   Spinal stenosis    Tachycardia 03/25/2015   Tendinopathy of right gluteus medius 01/01/2017   Vertigo    3 yrs ago   Vitamin D deficiency    Vitamin D deficiency     ASSESSMENT: 83 y.o. female with PMH including cirrhosis with ascites, esophageal varices, colonic mass concerning for malignancy is presenting with right femoral fracture and pulmonary emboli. CT chest  demonstrates pulmonary emboli in RLL with evidence for right heart strain. Patient is not on chronic anticoagulation per chart review but has received prophylactic enoxaparin  the past three days. Pharmacy has been consulted to initiate and manage heparin  intravenous infusion.  Pertinent medications: Enoxaparin  40mg , last administered 7/13 @ 0919  Date Time HL Rate/Comment 07/14 0740 0.43 Therapeutic x 1; 900 un/hr 07/14 1543 0.32 Therapeutic x 2; 900 un/hr 07/15   0515    0.30     Therapeutic X 3; 900 un/hr  07/16   0514    0.48     Therapeutic X 4; 1000 units/hr  Goal(s) of therapy: Heparin  level 0.3 - 0.7 units/mL Monitor platelets by anticoagulation protocol: Yes   Baseline anticoagulation labs: Recent Labs    08/29/23 2250 08/30/23 1025 08/31/23 0515 09/01/23 0514  APTT 32  --   --   --   INR 1.3*  --   --   --   HGB  --  8.9* 8.4*  8.2*  PLT  --  141* 117* 105*   Baseline aPTT and INR have been ordered  PLAN: 7/16:  HL @ 0514 = 0.48, therapeutic X 4 - Will continue pt on current rate and recheck HL on 7/17 with AM labs Monitor CBC daily while on heparin  infusion  Thank you for involving pharmacy in this patient's care.   Ariyannah Pauling D 09/01/2023 5:46 AM

## 2023-09-02 ENCOUNTER — Inpatient Hospital Stay

## 2023-09-02 DIAGNOSIS — K7460 Unspecified cirrhosis of liver: Secondary | ICD-10-CM | POA: Diagnosis not present

## 2023-09-02 DIAGNOSIS — I2699 Other pulmonary embolism without acute cor pulmonale: Secondary | ICD-10-CM | POA: Insufficient documentation

## 2023-09-02 DIAGNOSIS — E43 Unspecified severe protein-calorie malnutrition: Secondary | ICD-10-CM | POA: Diagnosis not present

## 2023-09-02 DIAGNOSIS — R188 Other ascites: Secondary | ICD-10-CM | POA: Diagnosis not present

## 2023-09-02 LAB — CBC
HCT: 24.1 % — ABNORMAL LOW (ref 36.0–46.0)
Hemoglobin: 7.6 g/dL — ABNORMAL LOW (ref 12.0–15.0)
MCH: 28.9 pg (ref 26.0–34.0)
MCHC: 31.5 g/dL (ref 30.0–36.0)
MCV: 91.6 fL (ref 80.0–100.0)
Platelets: 107 K/uL — ABNORMAL LOW (ref 150–400)
RBC: 2.63 MIL/uL — ABNORMAL LOW (ref 3.87–5.11)
RDW: 17.2 % — ABNORMAL HIGH (ref 11.5–15.5)
WBC: 2.5 K/uL — ABNORMAL LOW (ref 4.0–10.5)
nRBC: 0.8 % — ABNORMAL HIGH (ref 0.0–0.2)

## 2023-09-02 LAB — COMPREHENSIVE METABOLIC PANEL WITH GFR
ALT: 21 U/L (ref 0–44)
AST: 39 U/L (ref 15–41)
Albumin: 2.6 g/dL — ABNORMAL LOW (ref 3.5–5.0)
Alkaline Phosphatase: 79 U/L (ref 38–126)
Anion gap: 6 (ref 5–15)
BUN: 14 mg/dL (ref 8–23)
CO2: 23 mmol/L (ref 22–32)
Calcium: 8.3 mg/dL — ABNORMAL LOW (ref 8.9–10.3)
Chloride: 109 mmol/L (ref 98–111)
Creatinine, Ser: 0.55 mg/dL (ref 0.44–1.00)
GFR, Estimated: >60 mL/min (ref >60)
Glucose, Bld: 177 mg/dL — ABNORMAL HIGH (ref 70–99)
Potassium: 3.9 mmol/L (ref 3.5–5.1)
Sodium: 138 mmol/L (ref 135–145)
Total Bilirubin: 1.2 mg/dL (ref 0.0–1.2)
Total Protein: 5.6 g/dL — ABNORMAL LOW (ref 6.5–8.1)

## 2023-09-02 LAB — ABO/RH: ABO/RH(D): O POS

## 2023-09-02 LAB — MAGNESIUM: Magnesium: 2 mg/dL (ref 1.7–2.4)

## 2023-09-02 LAB — HEPARIN LEVEL (UNFRACTIONATED): Heparin Unfractionated: 0.33 [IU]/mL (ref 0.30–0.70)

## 2023-09-02 LAB — PREPARE RBC (CROSSMATCH)

## 2023-09-02 LAB — PHOSPHORUS: Phosphorus: 2.1 mg/dL — ABNORMAL LOW (ref 2.5–4.6)

## 2023-09-02 MED ORDER — HYDROCODONE-ACETAMINOPHEN 5-325 MG PO TABS: 1.0000 | ORAL_TABLET | Freq: Four times a day (QID) | ORAL | 0 refills | Status: DC | PRN

## 2023-09-02 MED ORDER — SPIRONOLACTONE 25 MG PO TABS
25.0000 mg | ORAL_TABLET | Freq: Every day | ORAL | Status: DC
  Administered 2023-09-02 – 2023-09-06 (×5): 25 mg via ORAL
  Filled 2023-09-02 (×5): qty 1

## 2023-09-02 MED ORDER — FUROSEMIDE 40 MG PO TABS
40.0000 mg | ORAL_TABLET | Freq: Every day | ORAL | Status: DC
  Administered 2023-09-02 – 2023-09-04 (×3): 40 mg via ORAL
  Filled 2023-09-02 (×3): qty 1

## 2023-09-02 MED ORDER — APIXABAN 5 MG PO TABS: 5.0000 mg | ORAL_TABLET | Freq: Two times a day (BID) | ORAL | Status: DC

## 2023-09-02 MED ORDER — APIXABAN 5 MG PO TABS
10.0000 mg | ORAL_TABLET | Freq: Two times a day (BID) | ORAL | Status: DC
  Administered 2023-09-02 (×2): 10 mg via ORAL
  Filled 2023-09-02 (×2): qty 2

## 2023-09-02 MED ORDER — SODIUM CHLORIDE 0.9% IV SOLUTION: Freq: Once | INTRAVENOUS | Status: AC

## 2023-09-02 NOTE — Plan of Care (Signed)
   Problem: Education: Goal: Knowledge of General Education information will improve Description Including pain rating scale, medication(s)/side effects and non-pharmacologic comfort measures Outcome: Progressing

## 2023-09-02 NOTE — Consult Note (Signed)
 Pharmacy Consult Note - Anticoagulation  Pharmacy Consult for heparin  Indication: pulmonary embolus  PATIENT MEASUREMENTS: Height: 5' 3 (160 cm) Weight: 56.5 kg (124 lb 9.6 oz) IBW/kg (Calculated) : 52.4 HEPARIN  DW (KG): 56.5  VITAL SIGNS: Temp: 98.6 F (37 C) (07/17 0427) Temp Source: Oral (07/16 1925) BP: 116/65 (07/17 0427) Pulse Rate: 98 (07/17 0427)  Recent Labs    09/01/23 0514 09/02/23 0415  HGB 8.2* 7.6*  HCT 25.5* 24.1*  PLT 105* 107*  HEPARINUNFRC 0.48 0.33  CREATININE 0.55  --     Estimated Creatinine Clearance: 44.1 mL/min (by C-G formula based on SCr of 0.55 mg/dL).  PAST MEDICAL HISTORY: Past Medical History:  Diagnosis Date   Adjustment disorder with anxious mood 10/05/2013   Anxiety    Arthritis    back   Bronchitis    currently   Calculus of kidney 03/25/2015   CHF (congestive heart failure) (HCC)    pt says she thinks she has this   Cirrhosis of liver (HCC) 2016   Depression    Esophageal varices (HCC)    Esophageal varices (HCC)    Gastritis without bleeding 2019   GERD (gastroesophageal reflux disease)    H/O: depression    History of kidney stones    Hx of sinus tachycardia 07/06/2017   Hypertension    Nerve root pain 12/11/2011   Non-alcoholic fatty liver disease    Nonalcoholic fatty liver disease    Osteopenia    Osteopenia    Reflux    in past   Spinal stenosis    Tachycardia 03/25/2015   Tendinopathy of right gluteus medius 01/01/2017   Vertigo    3 yrs ago   Vitamin D deficiency    Vitamin D deficiency     ASSESSMENT: 83 y.o. female with PMH including cirrhosis with ascites, esophageal varices, colonic mass concerning for malignancy is presenting with right femoral fracture and pulmonary emboli. CT chest demonstrates pulmonary emboli in RLL with evidence for right heart strain. Patient is not on chronic anticoagulation per chart review but has received prophylactic enoxaparin  the past three days. Pharmacy has been consulted  to initiate and manage heparin  intravenous infusion.  Pertinent medications: Enoxaparin  40mg , last administered 7/13 @ 0919  Date Time HL Rate/Comment 07/14 0740 0.43 Therapeutic x 1; 900 un/hr 07/14 1543 0.32 Therapeutic x 2; 900 un/hr 07/15   0515    0.30     Therapeutic X 3; 900 un/hr  07/16   0514    0.48     Therapeutic X 4; 1000 units/hr 07/17   0415    0.33     Therapeutic X 5; 1000 units/hr  Goal(s) of therapy: Heparin  level 0.3 - 0.7 units/mL Monitor platelets by anticoagulation protocol: Yes   Baseline anticoagulation labs: Recent Labs    08/31/23 0515 09/01/23 0514 09/02/23 0415  HGB 8.4* 8.2* 7.6*  PLT 117* 105* 107*   Baseline aPTT and INR have been ordered  PLAN: 7/17:  HL @ 0415 = 0.33, therapeutic X 5  - Will continue pt on current rate and recheck HL on 7/18 with AM labs Monitor CBC daily while on heparin  infusion  Thank you for involving pharmacy in this patient's care.   Markayla Reichart D 09/02/2023 5:41 AM

## 2023-09-02 NOTE — Progress Notes (Addendum)
 Progress Note    Jenna Franklin  FMW:[id number] DOB: 12/18/1940  DOA: 08/26/2023 PCP: Justus Leita DEL, MD      Brief Narrative:    Medical records reviewed and are as summarized below:   Hospital course / significant events:   HPI: [name] is an 83 year old female with history of liver cirrhosis with splenomegaly and esophageal varices, anxiety, GERD, who presents emergency department for chief concerns of fall with resulting right hip pain.  07/10: admitted to hospitalist service. Underwent R hip fracture repair 07/11: PT/OT recs for SNF.  Discussion today with family regarding colon mass and options re further w/u  07/12: stable, await SNF thru the weekend 07/13: pt remains confused. Tachycardic into this afternoon worsening, dx new PE started on heparin   07/14: remains on heparin . Echo no RV strain. Ascites appears a bit worse today. Oncology consult - Dr Babara and I spoke w/ daughter [name], considering defer aggressive workup/treatment and focus on palliative/hospice, [name] will discuss further w/ family. At this time pt encephalopathic d/t hyperammonemia +/- underlying MCI/dementia and unable to participate in decisions.  07/15: ammonia normalized, pt still confused. Intermittently tachycardic, remains on heparin . Family seems to have definitively decided on NO biopsy as this will not really change course of care. Palliative consult ordered - plan at this point to treat the treatable, pursue rehab to hopefully at least get patients stronger / enhance QOL. Prognosis difficult - no real utility to PET and no real way to determine how quickly patient might decline. Still confused but a bit better - family would like to wait until tomorrow when pt's sone is getting into town to tall her about the mass etc.          Assessment/Plan:   Principal Problem:   Right femoral fracture (HCC) Active Problems:   Anxiety   Acid reflux   Cirrhosis of liver with ascites  (HCC)   Esophageal varices in cirrhosis (HCC)   Colonic mass   Weight loss   Protein-calorie malnutrition, severe   Closed fracture of right hip (HCC)   Hepatic encephalopathy (HCC)   Acute pulmonary embolism (HCC)   Nutrition Problem: Severe Malnutrition Etiology: chronic illness (cirrhosis)  Signs/Symptoms: percent weight loss, moderate fat depletion, severe fat depletion, moderate muscle depletion, severe muscle depletion   Body mass index is 22.07 kg/m.     Right femoral fracture Secondary to fall S/p Right Hip Intramedullary nail repair 08/26/2023 w/ Dr Lorelle Outpatient follow-up with [facility] clinic orthopedics 2 weeks postop Analgesics as needed. PT recommended discharge to SNF   Pulmonary Embolus No RV strain on echo Discussed risk and benefits long-term anticoagulation.  Patient is at high risk for recurrent thromboembolism because of underlying liver cirrhosis and suspected colon cancer.  She is also at high risk of bleeding because of advanced age, thrombocytopenia, liver cirrhosis, esophageal varices and malignancy. Had a lengthy discussion with [name] (daughter) for over 40 minutes.  She would like patient to transition to Eliquis .   Colonic mass with infiltration into omentum, likely malignancy Pulmonary nodule, malignancy cannot be excluded Weight loss Given CT read of colonic mass with omental implants and small pulmonary nodules, high suspicion for malignancy and metastatic disease cannot be excluded at this time Pt's son and daughter ask to defer conversation w/ patient for now until more lucid, pt certainly lacks capacity at this time to participate in discussions and decision making  Dr Jinny GI spoke 07/11 with patient's daughter, holding off on  colonoscopy for now and considering overall frailty and other comorbidities may not be a good candidate for aggressive workup / treatment but would recommend further discussion with oncology team Oncology consult  07/14 option for biopsy mass to help dx/prognostication but overall pt is poor candidate for treatments [name], daughter, would prefer that patient can go to SNF and have PET scan as an outpatient to confirm metastatic disease before making any further decisions regarding additional investigations and treatment.     Cirrhosis complicated by Esophageal varices Ascites  Continue nadolol  Discussed paracentesis with [name], daughter.  She understands that an ultrasound will be done prior to performing paracentesis.  If there is no drainable pocket of fluid for aspiration then the procedure will not be performed.  She is agreeable with the plan.  She also asked about Lasix  and spironolactone  and said that patient had been prescribed these medicines about 10 years ago when she was first diagnosed with with ascites.  She was to try these diuretics as well.  Lasix  and Aldactone  have been prescribed.   Acute metabolic encephalopathy d/t acute illness / SIRS / hepatic encephalopathy/delirium Underlying cognitive impairment suspect possibly dementia Pt does not have capacity to understand her medical situation / make complex decisions at this time  Continue lactulose  Consider brain imaging per oncology Daughter and son are involved in her decisions, daughter has been the point of contact which son is in agreement with   For now we are not informing patient of mass/cancer, per family request  Worsening anemia, acute on chronic anemia Chronic thrombocytopenia No evidence of bleeding for.   Transfuse 1 unit of PRBCs.  Discussed risks and benefits of blood transfusion with [name], patient's daughter.  She is agreeable to blood transfusion.  Monitor CBC and transfuse as needed   Anxiety Ativan  as needed   Constipation, chronic  Continue laxatives as needed Also lactulose  for hyperammonemia    Urinary retention  Foley catheter in place   Goals of care Treat what can be treated [name] will discuss  CODE STATUS with her brother and get back to us . Consulted palliative care   Plan was discussed with [name], daughter, in detail.  She was also informed that patient had pulled out her IV lines including midline catheter multiple times.  She is agreeable to blood transfusion and is hoping that perhaps peripheral IV access may be discontinued after blood transfusion.    Diet Order             Diet regular Room service appropriate? Yes; Fluid consistency: Thin  Diet effective now                            Consultants: Magazine features editor Orthopedic surgeon Palliative care  Procedures: Right Hip Intramedullary nail repair 08/26/2023 w/ Dr Lorelle    Medications:    sodium chloride    Intravenous Once   apixaban   10 mg Oral BID   Followed by   NOREEN ON 09/09/2023] apixaban   5 mg Oral BID   Chlorhexidine  Gluconate Cloth  6 each Topical Daily   docusate sodium   100 mg Oral BID   feeding supplement  237 mL Oral BID BM   furosemide   40 mg Oral Daily   lactulose   20 g Oral BID   multivitamin with minerals  1 tablet Oral Daily   nadolol   20 mg Oral Daily   polyethylene glycol  17 g Oral BID   spironolactone   25 mg  Oral Daily   Continuous Infusions:     Anti-infectives (From admission, onward)    Start     Dose/Rate Route Frequency Ordered Stop   08/26/23 2300  ceFAZolin  (ANCEF ) IVPB 2g/100 mL premix        2 g 200 mL/hr over 30 Minutes Intravenous Every 6 hours 08/26/23 2015 08/27/23 1059   08/26/23 1545  ceFAZolin  (ANCEF ) IVPB 2g/100 mL premix        2 g 200 mL/hr over 30 Minutes Intravenous  Once 08/26/23 1544 08/26/23 1628              Family Communication/Anticipated D/C date and plan/Code Status   DVT prophylaxis: SCDs Start: 08/26/23 2015 Place TED hose Start: 08/26/23 1551 apixaban  (ELIQUIS ) tablet 10 mg  apixaban  (ELIQUIS ) tablet 5 mg     Code Status: Full Code  Family Communication: None Disposition Plan: Plan to  discharge to SNF   Status is: Inpatient Remains inpatient appropriate because: Acute pulmonary embolism       Subjective:   Interval events noted.  I just don't feel well.  She is unable to elaborate further.   Objective:    Vitals:   09/01/23 1925 09/02/23 0427 09/02/23 0722 09/02/23 1516  BP: (!) 128/59 116/65 113/69 (!) 106/50  Pulse: (!) 107 98 93 84  Resp: 20 16 16 20   Temp: 98.9 F (37.2 C) 98.6 F (37 C) 97.7 F (36.5 C) 97.9 F (36.6 C)  TempSrc: Oral  Oral   SpO2: 99% 98% 100% 99%  Weight:      Height:       No data found.   Intake/Output Summary (Last 24 hours) at 09/02/2023 1519 Last data filed at 09/02/2023 9047 Gross per 24 hour  Intake 63.33 ml  Output 350 ml  Net -286.67 ml   Filed Weights   08/26/23 1143  Weight: 56.5 kg    Exam:  GEN: NAD SKIN: Warm and dry EYES: No pallor or icterus ENT: MMM CV: RRR PULM: CTA B ABD: soft, distended, NT, +BS CNS: AAO x 2 (person and place), non focal EXT: Mild right hip tenderness.  Right hip surgical dressing is clean, dry and intact       Data Reviewed:   I have personally reviewed following labs and imaging studies:  Labs: Labs show the following:   Basic Metabolic Panel: Recent Labs  Lab 08/28/23 1045 08/30/23 1025 08/31/23 1233 09/01/23 0514 09/02/23 0415  NA 133* 134* 136 137 138  K 3.7 4.0 3.7 3.4* 3.9  CL 100 105 104 107 109  CO2 24 22 20* 24 23  GLUCOSE 200* 215* 168* 181* 177*  BUN 11 12 12 14 14   CREATININE 0.68 0.58 0.41* 0.55 0.55  CALCIUM 8.9 8.3* 8.2* 8.2* 8.3*  MG  --   --   --   --  2.0  PHOS  --   --   --   --  2.1*   GFR Estimated Creatinine Clearance: 44.1 mL/min (by C-G formula based on SCr of 0.55 mg/dL). Liver Function Tests: Recent Labs  Lab 08/30/23 1025 08/31/23 1233 09/01/23 0514 09/02/23 0415  AST 49* 48* 42* 39  ALT 18 16 18 21   ALKPHOS 70 79 83 79  BILITOT 1.1 1.1 1.1 1.2  PROT 6.2* 6.0* 5.3* 5.6*  ALBUMIN 2.7* 2.6* 2.4* 2.6*   No  results for input(s): LIPASE, AMYLASE in the last 168 hours. Recent Labs  Lab 08/30/23 1025 08/31/23 1302 09/01/23 0517  AMMONIA 86*  28 36*   Coagulation profile Recent Labs  Lab 08/29/23 2250  INR 1.3*    CBC: Recent Labs  Lab 08/28/23 1045 08/30/23 1025 08/31/23 0515 09/01/23 0514 09/02/23 0415  WBC 9.3 3.7* 2.7* 2.8* 2.5*  HGB 9.9* 8.9* 8.4* 8.2* 7.6*  HCT 31.1* 27.1* 26.1* 25.5* 24.1*  MCV 91.5 88.3 89.7 90.1 91.6  PLT 189 141* 117* 105* 107*   Cardiac Enzymes: No results for input(s): CKTOTAL, CKMB, CKMBINDEX, TROPONINI in the last 168 hours.  BNP (last 3 results) No results for input(s): PROBNP in the last 8760 hours. CBG: No results for input(s): GLUCAP in the last 168 hours. D-Dimer: No results for input(s): DDIMER in the last 72 hours. Hgb A1c: No results for input(s): HGBA1C in the last 72 hours. Lipid Profile: No results for input(s): CHOL, HDL, LDLCALC, TRIG, CHOLHDL, LDLDIRECT in the last 72 hours. Thyroid  function studies: No results for input(s): TSH, T4TOTAL, T3FREE, THYROIDAB in the last 72 hours.  Invalid input(s): FREET3 Anemia work up: No results for input(s): VITAMINB12, FOLATE, FERRITIN, TIBC, IRON, RETICCTPCT in the last 72 hours. Sepsis Labs: Recent Labs  Lab 08/30/23 1025 08/31/23 0515 09/01/23 0514 09/02/23 0415  WBC 3.7* 2.7* 2.8* 2.5*    Microbiology No results found for this or any previous visit (from the past 240 hours).  Procedures and diagnostic studies:  No results found.             LOS: 7 days   Malasia Torain  [facility]   Pager on [email or web address]. If 7PM-7AM, please contact night-coverage at [email or web address]     09/02/2023, 3:19 PM

## 2023-09-02 NOTE — Progress Notes (Signed)
 Physical Therapy Treatment Patient Details Name: Jenna Franklin MRN: [id number] DOB: 1940/03/31 Today's Date: 09/02/2023   History of Present Illness [name] is an 83 year old female with history of liver cirrhosis with splenomegaly and esophageal varices, anxiety, GERD, who presents emergency department for chief concerns of fall with resulting right hip pain. S/p R IM nailing 08/26/23.    PT Comments  Pt more awake this date, but continues to be confused and pain limited with essentially all R hip activity.  She was able to participate with supine exercises, but needed a lot of cuing and encouragement to complete requested tasks and showed little tolerance with R hip activity 2/2 pain and being anxious.  On attempts to do mobility PT found that her IV was out (apparently she has pulled IVs out multiple times during this hospitalization) and nursing was called to address this issue.  Pt's mitts were off on arrival and she had been messing with her tele leads, nursing in room at end of session.  Pt will benefit from ongoing PT, continue with POC.    If plan is discharge home, recommend the following: Two people to help with walking and/or transfers;Two people to help with bathing/dressing/bathroom;Assistance with cooking/housework;Assist for transportation;Supervision due to cognitive status;Help with stairs or ramp for entrance   Can travel by private vehicle     No  Equipment Recommendations  Other (comment) (TBD at next venue)    Recommendations for Other Services       Precautions / Restrictions Precautions Precautions: Fall Recall of Precautions/Restrictions: Impaired Precaution/Restrictions Comments: Poor safety awareness Required Braces or Orthoses:  (Pt had taken off mitts) Restrictions Weight Bearing Restrictions Per Provider Order: Yes RLE Weight Bearing Per Provider Order: Weight bearing as tolerated     Mobility  Bed Mobility Overal bed mobility: Needs  Assistance Bed Mobility: Rolling Rolling: Mod assist   Supine to sit: Max assist     General bed mobility comments: Started to initiate getting LEs/hips toward EOB (pt able to do some minimal LE movement), needing help with this; on initiation to turn to side it was realized that pt had (likely) pulled out her IV.  Nursing notified and further mobility deferred to address Heparin  IV issue.    Transfers                   General transfer comment: aborted mobility once IV out issues were noted and nursing in to address    Ambulation/Gait                   Stairs             Wheelchair Mobility     Tilt Bed    Modified Rankin (Stroke Patients Only)       Balance                                            Communication Communication Communication: No apparent difficulties  Cognition Arousal: Alert Behavior During Therapy: Restless, Anxious   PT - Cognitive impairments: History of cognitive impairments                       PT - Cognition Comments: Constant redirection, poor insight Following commands: Impaired Following commands impaired: Follows one step commands inconsistently, Follows one step commands with increased time    Cueing Cueing  Techniques: Verbal cues, Tactile cues, Gestural cues, Visual cues  Exercises General Exercises - Lower Extremity Ankle Circles/Pumps: AROM, 10 reps Quad Sets: Strengthening, 10 reps Heel Slides: AAROM, 10 reps (resisted leg ext) Hip ABduction/ADduction: AAROM, 10 reps, PROM (Pt c/o pain with R hip P/AAROM reps)    General Comments General comments (skin integrity, edema, etc.): Pt continues to have general confusion, though did know she had broken her hip.  Pt had apparently pulled out her IV, nursing in at end of session to address, pt's mitts were off on PTs arrival      Pertinent Vitals/Pain Pain Assessment Pain Assessment: Faces Faces Pain Scale: Hurts even more  (wincing with essentially all R LE movement)    Home Living                          Prior Function            PT Goals (current goals can now be found in the care plan section) Progress towards PT goals: Progressing toward goals (slow progress)    Frequency    4X/week      PT Plan      Co-evaluation              AM-PAC PT 6 Clicks Mobility   Outcome Measure  Help needed turning from your back to your side while in a flat bed without using bedrails?: Total Help needed moving from lying on your back to sitting on the side of a flat bed without using bedrails?: Total Help needed moving to and from a bed to a chair (including a wheelchair)?: Total Help needed standing up from a chair using your arms (e.g., wheelchair or bedside chair)?: Total Help needed to walk in hospital room?: Total Help needed climbing 3-5 steps with a railing? : Total 6 Click Score: 6    End of Session   Activity Tolerance: Patient limited by pain (confusion) Patient left: with bed alarm set;with call bell/phone within reach;with nursing/sitter in room Nurse Communication: Mobility status (need for new IV) PT Visit Diagnosis: Muscle weakness (generalized) (M62.81);Difficulty in walking, not elsewhere classified (R26.2);Pain Pain - Right/Left: Right Pain - part of body: Hip     Time: 0910-0927 PT Time Calculation (min) (ACUTE ONLY): 17 min  Charges:    $Therapeutic Exercise: 8-22 mins PT General Charges $$ ACUTE PT VISIT: 1 Visit                     Carmin JONELLE Deed, DPT 09/02/2023, 9:48 AM

## 2023-09-02 NOTE — TOC Progression Note (Addendum)
 Transition of Care [facility]) - Progression Note    Patient Details  Name: Jenna Franklin MRN: [id number] Date of Birth: 1940/11/02  Transition of Care [facility]) CM/SW Contact  Elouise LULLA Capri, RN 09/02/2023, 11:21 AM  Clinical Narrative:     CM secure message for Therisa, Admissions, [employer] and [facility] regarding SNF preferences and bed availability.   Secure message from [place], [place] can start auth.   CM alert to [employer], CMA and Rojelio RAMAN, CMA to initiate serbia for [employer].   Alert received from Nitchia P, CMA regarding auth status, shara is Certified in [employer] Cert# [id number].  CM alert to Dr. Jens regarding medical stability for discharge. CM call to patient's daughter, Jenna Franklin, phone: [phone] regarding discharge care plan and insurance auth approval for SNF. Per patient's daughter, has discussed patient's status with Dr. Jens about patient needing blood transfusion and treatment of blood clot.   Alert from Dr. Jens, patient not medically ready. CM secure message to Therisa, Admission, [employer] regarding patient not medically stable for discharge today.     Expected Discharge Plan and Services    SNF     Social Determinants of Health (SDOH) Interventions SDOH Screenings   Food Insecurity: No Food Insecurity (08/26/2023)  Housing: Low Risk  (08/26/2023)  Transportation Needs: No Transportation Needs (08/26/2023)  Utilities: Not At Risk (08/26/2023)  Alcohol Screen: Low Risk  (08/12/2022)  Depression (PHQ2-9): Low Risk  (08/12/2022)  Financial Resource Strain: Low Risk  (08/12/2022)  Physical Activity: Inactive (08/12/2022)  Social Connections: Socially Isolated (08/26/2023)  Stress: No Stress Concern Present (08/12/2022)  Tobacco Use: Low Risk  (08/26/2023)    Readmission Risk Interventions     No data to display

## 2023-09-02 NOTE — Discharge Instructions (Signed)
 Information on my medicine - ELIQUIS  (apixaban )  This medication education was reviewed with me or my healthcare representative as part of my discharge preparation.    Why was Eliquis  prescribed for you? Eliquis  was prescribed to treat blood clots that may have been found in the veins of your legs (deep vein thrombosis) or in your lungs (pulmonary embolism) and to reduce the risk of them occurring again.  What do You need to know about Eliquis  ? The starting dose is 10 mg (two 5 mg tablets) taken TWICE daily for the FIRST SEVEN (7) DAYS, then on (enter date)  09/09/23  the dose is reduced to ONE 5 mg tablet taken TWICE daily.  Eliquis  may be taken with or without food.   Try to take the dose about the same time in the morning and in the evening. If you have difficulty swallowing the tablet whole please discuss with your pharmacist how to take the medication safely.  Take Eliquis  exactly as prescribed and DO NOT stop taking Eliquis  without talking to the doctor who prescribed the medication.  Stopping may increase your risk of developing a new blood clot.  Refill your prescription before you run out.  After discharge, you should have regular check-up appointments with your healthcare provider that is prescribing your Eliquis .    What do you do if you miss a dose? If a dose of ELIQUIS  is not taken at the scheduled time, take it as soon as possible on the same day and twice-daily administration should be resumed. The dose should not be doubled to make up for a missed dose.  Important Safety Information A possible side effect of Eliquis  is bleeding. You should call your healthcare provider right away if you experience any of the following: Bleeding from an injury or your nose that does not stop. Unusual colored urine (red or dark brown) or unusual colored stools (red or black). Unusual bruising for unknown reasons. A serious fall or if you hit your head (even if there is no  bleeding).  Some medicines may interact with Eliquis  and might increase your risk of bleeding or clotting while on Eliquis . To help avoid this, consult your healthcare provider or pharmacist prior to using any new prescription or non-prescription medications, including herbals, vitamins, non-steroidal anti-inflammatory drugs (NSAIDs) and supplements.  This website has more information on Eliquis  (apixaban ): http://[email or web address] .com/eliquis dena

## 2023-09-02 NOTE — IPAL (Signed)
  Interdisciplinary Goals of Care Family Meeting   Date carried out: 09/02/2023  Location of the meeting: Phone conference  Member's involved: Physician and Family Member or next of kin  Durable Power of Attorney or acting medical decision maker: [name] Cones    Discussion: We discussed diagnoses, prognosis and goals of care for [employer] .    Code status:   Code Status: Full Code. [name] will discuss CODE STATUS with her brother and get back to us   Disposition: Continue current acute care  Time spent for the meeting: 15 minutes    AIDA CHO, MD  09/02/2023, 3:16 PM

## 2023-09-02 NOTE — Consult Note (Signed)
 Pharmacy Consult Note - Anticoagulation  Pharmacy Consult for transition from heparin  to apixaban  Indication: pulmonary embolus  PATIENT MEASUREMENTS: Height: 5' 3 (160 cm) Weight: 56.5 kg (124 lb 9.6 oz) IBW/kg (Calculated) : 52.4 HEPARIN  DW (KG): 56.5  VITAL SIGNS: Temp: 97.7 F (36.5 C) (07/17 0722) Temp Source: Oral (07/17 0722) BP: 113/69 (07/17 0722) Pulse Rate: 93 (07/17 0722)  Recent Labs    09/02/23 0415  HGB 7.6*  HCT 24.1*  PLT 107*  HEPARINUNFRC 0.33  CREATININE 0.55    Estimated Creatinine Clearance: 44.1 mL/min (by C-G formula based on SCr of 0.55 mg/dL).  PAST MEDICAL HISTORY: Past Medical History:  Diagnosis Date   Adjustment disorder with anxious mood 10/05/2013   Anxiety    Arthritis    back   Bronchitis    currently   Calculus of kidney 03/25/2015   CHF (congestive heart failure) (HCC)    pt says she thinks she has this   Cirrhosis of liver (HCC) 2016   Depression    Esophageal varices (HCC)    Esophageal varices (HCC)    Gastritis without bleeding 2019   GERD (gastroesophageal reflux disease)    H/O: depression    History of kidney stones    Hx of sinus tachycardia 07/06/2017   Hypertension    Nerve root pain 12/11/2011   Non-alcoholic fatty liver disease    Nonalcoholic fatty liver disease    Osteopenia    Osteopenia    Reflux    in past   Spinal stenosis    Tachycardia 03/25/2015   Tendinopathy of right gluteus medius 01/01/2017   Vertigo    3 yrs ago   Vitamin D deficiency    Vitamin D deficiency     ASSESSMENT: 83 y.o. female with PMH including cirrhosis with ascites, esophageal varices, colonic mass concerning for malignancy is presenting with right femoral fracture and pulmonary emboli. CT chest demonstrates pulmonary emboli in RLL with evidence for right heart strain. Patient is not on chronic anticoagulation per chart review but has received prophylactic enoxaparin  the past three days. Pharmacy has been consulted to initiate  and manage heparin  intravenous infusion.  Pertinent medications: Enoxaparin  40mg , last administered 7/13 @ 0919  Date Time HL Rate/Comment 07/14 0740 0.43 Therapeutic x 1; 900 un/hr 07/14 1543 0.32 Therapeutic x 2; 900 un/hr 07/15   0515    0.30     Therapeutic X 3; 900 un/hr  07/16   0514    0.48     Therapeutic X 4; 1000 units/hr 07/17   0415    0.33     Therapeutic X 5; 1000 units/hr  Goal(s) of therapy: Heparin  level 0.3 - 0.7 units/mL Monitor platelets by anticoagulation protocol: Yes   Baseline anticoagulation labs: Recent Labs    08/31/23 0515 09/01/23 0514 09/02/23 0415  HGB 8.4* 8.2* 7.6*  PLT 117* 105* 107*   Baseline aPTT and INR have been ordered  PLAN: Stop heparin  infusion Will transition to apixaban  10 mg twice daily x7 days then 5 mg po twice daily thereafter Monitor CBC and signs/symptoms of bleeding  Thank you for involving pharmacy in this patient's care.   Damien Napoleon, PharmD Clinical Pharmacist 09/02/2023 1:51 PM

## 2023-09-02 NOTE — Plan of Care (Signed)
  Problem: Education: Goal: Knowledge of General Education information will improve Description: Including pain rating scale, medication(s)/side effects and non-pharmacologic comfort measures Outcome: Not Progressing   Problem: Health Behavior/Discharge Planning: Goal: Ability to manage health-related needs will improve Outcome: Not Progressing   Problem: Clinical Measurements: Goal: Ability to maintain clinical measurements within normal limits will improve Outcome: Not Progressing Goal: Will remain free from infection Outcome: Not Progressing Goal: Diagnostic test results will improve Outcome: Not Progressing Goal: Respiratory complications will improve Outcome: Not Progressing Goal: Cardiovascular complication will be avoided Outcome: Not Progressing   Problem: Activity: Goal: Risk for activity intolerance will decrease Outcome: Not Progressing   Problem: Nutrition: Goal: Adequate nutrition will be maintained Outcome: Not Progressing   Problem: Coping: Goal: Level of anxiety will decrease Outcome: Not Progressing   Problem: Elimination: Goal: Will not experience complications related to bowel motility Outcome: Not Progressing Goal: Will not experience complications related to urinary retention Outcome: Not Progressing   Problem: Pain Managment: Goal: General experience of comfort will improve and/or be controlled Outcome: Not Progressing   Problem: Safety: Goal: Ability to remain free from injury will improve Outcome: Not Progressing   Problem: Skin Integrity: Goal: Risk for impaired skin integrity will decrease Outcome: Not Progressing   Problem: Education: Goal: Verbalization of understanding the information provided (i.e., activity precautions, restrictions, etc) will improve Outcome: Not Progressing Goal: Individualized Educational Video(s) Outcome: Not Progressing   Problem: Activity: Goal: Ability to ambulate and perform ADLs will improve Outcome:  Not Progressing   Problem: Clinical Measurements: Goal: Postoperative complications will be avoided or minimized Outcome: Not Progressing   Problem: Self-Concept: Goal: Ability to maintain and perform role responsibilities to the fullest extent possible will improve Outcome: Not Progressing   Problem: Pain Management: Goal: Pain level will decrease Outcome: Not Progressing

## 2023-09-02 NOTE — Plan of Care (Signed)

## 2023-09-02 NOTE — TOC Progression Note (Signed)
 Transition of Care [facility]) - Progression Note    Patient Details  Name: Xian Apostol MRN: [id number] Date of Birth: 22-Jan-1941  Transition of Care [facility]) CM/SW Contact  Elouise LULLA Capri, RN Phone Number: 09/02/2023, 11:20 AM  Clinical Narrative:     CM call to Therisa, Admissions, [facility] regarding SNF preference and bed availability. No answer. CM left message for return call.    Expected Discharge Plan and Services    SNF   Social Determinants of Health (SDOH) Interventions SDOH Screenings   Food Insecurity: No Food Insecurity (08/26/2023)  Housing: Low Risk  (08/26/2023)  Transportation Needs: No Transportation Needs (08/26/2023)  Utilities: Not At Risk (08/26/2023)  Alcohol Screen: Low Risk  (08/12/2022)  Depression (PHQ2-9): Low Risk  (08/12/2022)  Financial Resource Strain: Low Risk  (08/12/2022)  Physical Activity: Inactive (08/12/2022)  Social Connections: Socially Isolated (08/26/2023)  Stress: No Stress Concern Present (08/12/2022)  Tobacco Use: Low Risk  (08/26/2023)    Readmission Risk Interventions     No data to display

## 2023-09-03 ENCOUNTER — Encounter
Admission: EM | Disposition: A | Discharge status: Skilled Nursing Facility | Payer: Self-pay | Source: Home / Self Care | Attending: Internal Medicine

## 2023-09-03 DIAGNOSIS — E43 Unspecified severe protein-calorie malnutrition: Secondary | ICD-10-CM | POA: Diagnosis not present

## 2023-09-03 DIAGNOSIS — D500 Iron deficiency anemia secondary to blood loss (chronic): Secondary | ICD-10-CM

## 2023-09-03 DIAGNOSIS — R410 Disorientation, unspecified: Secondary | ICD-10-CM

## 2023-09-03 DIAGNOSIS — I2699 Other pulmonary embolism without acute cor pulmonale: Secondary | ICD-10-CM

## 2023-09-03 DIAGNOSIS — K922 Gastrointestinal hemorrhage, unspecified: Secondary | ICD-10-CM

## 2023-09-03 DIAGNOSIS — S72001A Fracture of unspecified part of neck of right femur, initial encounter for closed fracture: Secondary | ICD-10-CM | POA: Diagnosis not present

## 2023-09-03 DIAGNOSIS — R161 Splenomegaly, not elsewhere classified: Secondary | ICD-10-CM

## 2023-09-03 DIAGNOSIS — I8500 Esophageal varices without bleeding: Secondary | ICD-10-CM

## 2023-09-03 DIAGNOSIS — K7460 Unspecified cirrhosis of liver: Secondary | ICD-10-CM | POA: Diagnosis not present

## 2023-09-03 DIAGNOSIS — Z515 Encounter for palliative care: Secondary | ICD-10-CM | POA: Diagnosis not present

## 2023-09-03 HISTORY — PX: IVC FILTER INSERTION: CATH118245

## 2023-09-03 LAB — TYPE AND SCREEN
ABO/RH(D): O POS
Antibody Screen: NEGATIVE
Unit division: 0

## 2023-09-03 LAB — HEMOGLOBIN AND HEMATOCRIT, BLOOD
HCT: 29.1 % — ABNORMAL LOW (ref 36.0–46.0)
Hemoglobin: 9.4 g/dL — ABNORMAL LOW (ref 12.0–15.0)

## 2023-09-03 LAB — BPAM RBC
Blood Product Expiration Date: 202508222359
ISSUE DATE / TIME: 202507172236
Unit Type and Rh: 5100

## 2023-09-03 SURGERY — IVC FILTER INSERTION
Anesthesia: Moderate Sedation

## 2023-09-03 MED ORDER — MIDAZOLAM HCL 2 MG/2ML IJ SOLN
INTRAMUSCULAR | Status: AC
  Filled 2023-09-03: qty 2

## 2023-09-03 MED ORDER — HEPARIN (PORCINE) IN NACL 1000-0.9 UT/500ML-% IV SOLN
INTRAVENOUS | Status: DC | PRN
Start: 2023-09-03 — End: 2023-09-03
  Administered 2023-09-03 (×2): 500 mL

## 2023-09-03 MED ORDER — LIDOCAINE HCL (PF) 1 % IJ SOLN
INTRAMUSCULAR | Status: DC | PRN
  Administered 2023-09-03: 10 mL

## 2023-09-03 MED ORDER — FENTANYL CITRATE (PF) 100 MCG/2ML IJ SOLN
INTRAMUSCULAR | Status: AC
  Filled 2023-09-03: qty 2

## 2023-09-03 MED ORDER — CEFAZOLIN SODIUM-DEXTROSE 1-4 GM/50ML-% IV SOLN
1.0000 g | INTRAVENOUS | Status: AC
  Administered 2023-09-03: 1 g via INTRAVENOUS

## 2023-09-03 MED ORDER — IODIXANOL 320 MG/ML IV SOLN
INTRAVENOUS | Status: DC | PRN
  Administered 2023-09-03: 10 mL

## 2023-09-03 MED ORDER — MIDAZOLAM HCL 2 MG/2ML IJ SOLN
INTRAMUSCULAR | Status: DC | PRN
  Administered 2023-09-03: .5 mg via INTRAVENOUS

## 2023-09-03 MED ORDER — CEFAZOLIN SODIUM-DEXTROSE 1-4 GM/50ML-% IV SOLN
INTRAVENOUS | Status: AC
  Filled 2023-09-03: qty 50

## 2023-09-03 MED ORDER — PANTOPRAZOLE SODIUM 40 MG IV SOLR
40.0000 mg | Freq: Two times a day (BID) | INTRAVENOUS | Status: DC
  Administered 2023-09-03 – 2023-09-06 (×6): 40 mg via INTRAVENOUS
  Filled 2023-09-03 (×5): qty 10

## 2023-09-03 SURGICAL SUPPLY — 8 items
COVER PROBE ULTRASOUND 5X96 (MISCELLANEOUS) IMPLANT
KIT FEM OPTION ELITE FILTER (Filter) IMPLANT
NDL ENTRY 21GA 7CM ECHOTIP (NEEDLE) IMPLANT
NEEDLE ENTRY 21GA 7CM ECHOTIP (NEEDLE) ×1 IMPLANT
PACK ANGIOGRAPHY (CUSTOM PROCEDURE TRAY) ×1 IMPLANT
SET INTRO CAPELLA COAXIAL (SET/KITS/TRAYS/PACK) IMPLANT
WIRE J 3MM .035X145CM (WIRE) IMPLANT
WIRE SUPRACORE 190CM (WIRE) IMPLANT

## 2023-09-03 NOTE — Consult Note (Addendum)
 Consultation Note Date: 09/03/2023   Patient Name: Jenna Franklin  DOB: 04-08-1940  MRN: [id number]  Age / Sex: 83 y.o., female  PCP: Justus Leita DEL, MD Referring Physician: Jens Durand, MD  Reason for Consultation: Establishing goals of care   HPI/Brief Hospital Course: 83 y.o. female  with past medical history of liver cirrhosis with splenomegaly and esophageal varices, anxiety and GERD admitted from home on 08/26/2023 with fall resulting in right hip pain.  Found to have right hip fracture, underwent orthopedic repair 7/10.  Imaging obtained in the ED concerning for colonic mass with suspicion of malignancy to the proximal transverse colon with omental implants-small pulmonary nodules also noted with concern for metastatic disease Acute PE also noted-course further complicated by acute GI bleed  Palliative medicine was consulted for assisting with goals of care conversations.  Subjective:  Extensive chart review has been completed prior to meeting patient including labs, vital signs, imaging, progress notes, orders, and available advanced directive documents from current and previous encounters.  Visited with [name] at her bedside.  She is resting in bed, sitter present at bedside, she is awake and alert, disoriented unable to answer orientation questions beside name and birthdate.  Sitter reports poor p.o. intake for breakfast and lunch today.  [name] denies acute pain or discomfort.  No family or visitors at  bedside during time of visit.  Called and with daughter [name], plans that to meet with [name] later this afternoon.  Returned to bedside to meet with [name], at her request we met outside [place] room to have goals of care conversation.  Introduced myself as a Publishing rights manager as a member of the palliative care team. Explained palliative medicine is specialized medical care for people living with serious illness. It focuses on  providing relief from the symptoms and stress of a serious illness. The goal is to improve quality of life for both the patient and the family.   During our conversations vascular surgery enters the room to discuss placement of IVC filter.  Plan for filter placement later this evening.  Family at bedside shares with me prior to admission [name] had assistance from CNA in her home 4 times a week that assisted her with bathing, hygiene and light housework.  Due to her recent decline in functional status they had increased her assistance to 5 days a week but this was not started prior to his fall and being admitted to hospital.  Continued conversation outside of room with daughter [name].  [name] able to explain her understanding of [name] current medical condition as well as new findings on imaging during this current admission. [name] shares her frustration with not being able to obtain a PET scan while inpatient as this will guide her decision making.  We discussed patient's current illness and what it means in the larger context of patient's on-going co-morbidities. Natural disease trajectory and expectations at EOL were discussed.   [name] places her brother Chyrl on speaker phone and we continue goals of care conversations.  We addressed CODE STATUS and the difference between full code and DO NOT RESUSCITATE. Encouraged family to consider DNR/DNI status understanding evidenced based poor outcomes in similar hospitalized patients, as the cause of the arrest is likely associated with chronic/terminal disease rather than a reversible acute cardio-pulmonary event.    As our conversations continued, [name] requests a cup of water .  She shares she is not feeling well.  [name] feels this is related to her high  blood pressure.  Discussed seeking medical attention in the emergency department for which [name] declined.  She shares her husband will drive her home.  She shares that she is under a large amount of  stress as her son is also currently hospitalized.  She has requested that our conversations end.  All questions/concerns addressed. Emotional support provided to patient/family/support persons. PMT will continue to follow and support patient as needed.  Charge RN made aware of Susan's complaints.  Objective: Primary Diagnoses: Present on Admission:  Right femoral fracture (HCC)  Esophageal varices in cirrhosis (HCC)  Cirrhosis of liver with ascites (HCC)  Acid reflux  Anxiety   Physical Exam Constitutional:      General: [name].    Appearance: She is ill-appearing.  Pulmonary:     Effort: Pulmonary effort is normal. No respiratory distress.  Skin:    General: Skin is warm and dry.  Neurological:     Mental Status: She is alert. She is disoriented.     Motor: Weakness present.     Vital Signs: BP 117/68 (BP Location: Left Arm)   Pulse (!) 104   Temp 98.8 F (37.1 C) (Oral)   Resp 19   Ht 5' 3 (1.6 m)   Wt 56.5 kg   SpO2 98%   BMI 22.07 kg/m  Pain Scale: 0-10 POSS *See Group Information*: 1-Acceptable,Awake and alert Pain Score: 0-No pain  IO: Intake/output summary:  Intake/Output Summary (Last 24 hours) at 09/03/2023 1506 Last data filed at 09/03/2023 1501 Gross per 24 hour  Intake 420 ml  Output 555 ml  Net -135 ml    LBM: Last BM Date : 09/02/23 Baseline Weight: Weight: 56.5 kg Most recent weight: Weight: 56.5 kg      Assessment and Plan  SUMMARY OF RECOMMENDATIONS   Continue with current plan of care  Palliative Prophylaxis:   Bowel Regimen, Delirium Protocol and Frequent Pain Assessment  Thank you for this consult and allowing Palliative Medicine to participate in the care of [name]. Palliative medicine will continue to follow and assist as needed.   Time Total: 55 minutes  Time spent includes: Detailed review of medical records (labs, imaging, vital signs), medically appropriate exam (mental status, respiratory,  cardiac, skin), discussed with treatment team, counseling and educating patient, family and staff, documenting clinical information, medication management and coordination of care.   Signed by: Waddell Lesches, DNP, AGNP-C Palliative Medicine    Please contact Palliative Medicine Team phone at [phone] for questions and concerns.  For individual provider: See Tracey

## 2023-09-03 NOTE — TOC CM/SW Note (Signed)
..  Transition of Care [facility]) - Inpatient Brief Assessment   Patient Details  Name: Jenna Franklin MRN: [id number] Date of Birth: [birth year]  [facility]) CM/SW Contact:    Edsel DELENA Fischer, LCSW Phone Number: 09/03/2023, 2:57 PM   Clinical Narrative:  SW contacted Therisa at [facility] at [phone] to ask about placement on the weekend.  No answer. SW left message  Transition of Care Asessment:

## 2023-09-03 NOTE — Progress Notes (Addendum)
 Progress Note    Jenna Franklin  FMW:[id number] DOB: 1940-11-15  DOA: 08/26/2023 PCP: Justus Leita DEL, MD      Brief Narrative:    Medical records reviewed and are as summarized below:   Hospital course / significant events:   HPI: Jenna Franklin is an 83 year old female with history of liver cirrhosis with splenomegaly and esophageal varices, anxiety, GERD, who presents emergency department for chief concerns of fall with resulting right hip pain.  07/10: admitted to hospitalist service. Underwent R hip fracture repair 07/11: PT/OT recs for SNF.  Discussion today with family regarding colon mass and options re further w/u  07/12: stable, await SNF thru the weekend 07/13: pt remains confused. Tachycardic into this afternoon worsening, dx new PE started on heparin   07/14: remains on heparin . Echo no RV strain. Ascites appears a bit worse today. Oncology consult - Dr Babara and I spoke w/ daughter [name], considering defer aggressive workup/treatment and focus on palliative/hospice, [name] will discuss further w/ family. At this time pt encephalopathic d/t hyperammonemia +/- underlying MCI/dementia and unable to participate in decisions.  07/15: ammonia normalized, pt still confused. Intermittently tachycardic, remains on heparin . Family seems to have definitively decided on NO biopsy as this will not really change course of care. Palliative consult ordered - plan at this point to treat the treatable, pursue rehab to hopefully at least get patients stronger / enhance QOL. Prognosis difficult - no real utility to PET and no real way to determine how quickly patient might decline. Still confused but a bit better - family would like to wait until tomorrow when pt's sone is getting into town to tall her about the mass etc.          Assessment/Plan:   Principal Problem:   Right femoral fracture (HCC) Active Problems:   Anxiety   Acid reflux   Cirrhosis of liver with ascites  (HCC)   Esophageal varices in cirrhosis (HCC)   Colonic mass   Weight loss   Protein-calorie malnutrition, severe   Closed fracture of right hip (HCC)   Hepatic encephalopathy (HCC)   Acute pulmonary embolism (HCC)   Acute GI bleeding   Nutrition Problem: Severe Malnutrition Etiology: chronic illness (cirrhosis)  Signs/Symptoms: percent weight loss, moderate fat depletion, severe fat depletion, moderate muscle depletion, severe muscle depletion   Body mass index is 22.07 kg/m.     Right femoral fracture Secondary to fall S/p Right Hip Intramedullary nail repair 08/26/2023 w/ Dr Lorelle Outpatient follow-up with [facility] clinic orthopedics 2 weeks postop Analgesics as needed. PT recommended discharge to SNF   Pulmonary Embolus No RV strain on echo Eliquis  was held by Harlene, Charity fundraiser, this morning because of large amount of brown/red red and black/brown stools.  I was informed this evening that Eliquis  had been held in the morning because of active GI bleeding.  Discontinue Eliquis  for now because of acute GI bleeding Consulted vascular surgeon, Dr. Jama, to evaluate patient for IVC filter placement.   Acute GI bleeding .     Start IV Protonix   Colonic mass with infiltration into omentum, likely malignancy Pulmonary nodule, malignancy cannot be excluded Weight loss Given CT read of colonic mass with omental implants and small pulmonary nodules, high suspicion for malignancy and metastatic disease cannot be excluded at this time Pt's son and daughter ask to defer conversation w/ patient for now until more lucid, pt certainly lacks capacity at this time to participate in discussions and decision  making  Dr Jinny GI spoke 07/11 with patient's daughter, holding off on colonoscopy for now and considering overall frailty and other comorbidities may not be a good candidate for aggressive workup / treatment but would recommend further discussion with oncology team Oncology consult  07/14 option for biopsy mass to help dx/prognostication but overall pt is poor candidate for treatments [name], daughter, would prefer that patient can go to SNF and have PET scan as an outpatient to confirm metastatic disease before making any further decisions regarding additional investigations and treatment.     Cirrhosis complicated by Esophageal varices Ascites  Continue nadolol  Continue Lasix  and spironolactone .  She is on declined paracentesis.   Acute metabolic encephalopathy d/t acute illness / SIRS / hepatic encephalopathy/delirium Underlying cognitive impairment suspect possibly dementia Pt does not have capacity to understand her medical situation / make complex decisions at this time   Continue lactulose  Mental status is improving but not back to baseline Daughter and son are involved in her decisions, daughter has been the point of contact which son is in agreement with   For now we are not informing patient of mass/cancer, per family request   Acute on chronic anemia Chronic thrombocytopenia Hemoglobin improved from 7.6-9.4.  Monitor H&H. S/p transfusion with 1 unit of PRBCs on 09/02/2023.   Anxiety Ativan  as needed   Constipation, chronic  Continue laxatives as needed Also lactulose  for hyperammonemia    Urinary retention  Foley catheter in place   Goals of care Treat what can be treated [name] will discuss CODE STATUS with her brother and get back to us . Consulted palliative care   Plan discussed with [name], daughter, over the phone.  Consulted Dr. Jama, vascular surgeon, to evaluate patient for IVC filter placement.     Diet Order             Diet regular Room service appropriate? Yes; Fluid consistency: Thin  Diet effective now                            Consultants: Magazine features editor Orthopedic surgeon Palliative care  Procedures: Right Hip Intramedullary nail repair 08/26/2023 w/ Dr  Lorelle    Medications:    apixaban   10 mg Oral BID   Followed by   NOREEN ON 09/09/2023] apixaban   5 mg Oral BID   Chlorhexidine  Gluconate Cloth  6 each Topical Daily   docusate sodium   100 mg Oral BID   feeding supplement  237 mL Oral BID BM   furosemide   40 mg Oral Daily   lactulose   20 g Oral BID   multivitamin with minerals  1 tablet Oral Daily   nadolol   20 mg Oral Daily   polyethylene glycol  17 g Oral BID   spironolactone   25 mg Oral Daily   Continuous Infusions:     Anti-infectives (From admission, onward)    Start     Dose/Rate Route Frequency Ordered Stop   08/26/23 2300  ceFAZolin  (ANCEF ) IVPB 2g/100 mL premix        2 g 200 mL/hr over 30 Minutes Intravenous Every 6 hours 08/26/23 2015 08/27/23 1059   08/26/23 1545  ceFAZolin  (ANCEF ) IVPB 2g/100 mL premix        2 g 200 mL/hr over 30 Minutes Intravenous  Once 08/26/23 1544 08/26/23 1628              Family Communication/Anticipated D/C date and plan/Code Status   DVT prophylaxis:  SCDs Start: 08/26/23 2015 Place TED hose Start: 08/26/23 1551 apixaban  (ELIQUIS ) tablet 10 mg  apixaban  (ELIQUIS ) tablet 5 mg     Code Status: Full Code  Family Communication: None Disposition Plan: Plan to discharge to SNF   Status is: Inpatient Remains inpatient appropriate because: Acute pulmonary embolism       Subjective:   Interval events noted.  No complaints.  She said she feels better today.  She no longer feels tired.  No shortness of breath or chest pain.  She was more lucid and communicative today but she said she had lost track of her days.  Sitter was at the bedside.  Objective:    Vitals:   09/03/23 0800 09/03/23 0951 09/03/23 1215 09/03/23 1521  BP: 121/66 114/70 117/68 (!) 98/50  Pulse: 93  (!) 104 83  Resp: 19   16  Temp: 98 F (36.7 C)  98.8 F (37.1 C) 98.2 F (36.8 C)  TempSrc:   Oral   SpO2: 98%  98% 97%  Weight:      Height:       No data found.   Intake/Output Summary  (Last 24 hours) at 09/03/2023 1640 Last data filed at 09/03/2023 1501 Gross per 24 hour  Intake 420 ml  Output 555 ml  Net -135 ml   Filed Weights   08/26/23 1143  Weight: 56.5 kg    Exam:  GEN: NAD, mittens have been taken off SKIN: Warm and dry EYES: No pallor or icterus ENT: MMM CV: RRR PULM: CTA B ABD: soft, ND, NT, +BS CNS: AAO x 2 (person and place) but confusion has improved, non focal EXT: No edema or tenderness       Data Reviewed:   I have personally reviewed following labs and imaging studies:  Labs: Labs show the following:   Basic Metabolic Panel: Recent Labs  Lab 08/28/23 1045 08/30/23 1025 08/31/23 1233 09/01/23 0514 09/02/23 0415  NA 133* 134* 136 137 138  K 3.7 4.0 3.7 3.4* 3.9  CL 100 105 104 107 109  CO2 24 22 20* 24 23  GLUCOSE 200* 215* 168* 181* 177*  BUN 11 12 12 14 14   CREATININE 0.68 0.58 0.41* 0.55 0.55  CALCIUM 8.9 8.3* 8.2* 8.2* 8.3*  MG  --   --   --   --  2.0  PHOS  --   --   --   --  2.1*   GFR Estimated Creatinine Clearance: 44.1 mL/min (by C-G formula based on SCr of 0.55 mg/dL). Liver Function Tests: Recent Labs  Lab 08/30/23 1025 08/31/23 1233 09/01/23 0514 09/02/23 0415  AST 49* 48* 42* 39  ALT 18 16 18 21   ALKPHOS 70 79 83 79  BILITOT 1.1 1.1 1.1 1.2  PROT 6.2* 6.0* 5.3* 5.6*  ALBUMIN 2.7* 2.6* 2.4* 2.6*   No results for input(s): LIPASE, AMYLASE in the last 168 hours. Recent Labs  Lab 08/30/23 1025 08/31/23 1302 09/01/23 0517  AMMONIA 86* 28 36*   Coagulation profile Recent Labs  Lab 08/29/23 2250  INR 1.3*    CBC: Recent Labs  Lab 08/28/23 1045 08/30/23 1025 08/31/23 0515 09/01/23 0514 09/02/23 0415 09/03/23 0417  WBC 9.3 3.7* 2.7* 2.8* 2.5*  --   HGB 9.9* 8.9* 8.4* 8.2* 7.6* 9.4*  HCT 31.1* 27.1* 26.1* 25.5* 24.1* 29.1*  MCV 91.5 88.3 89.7 90.1 91.6  --   PLT 189 141* 117* 105* 107*  --    Cardiac Enzymes: No results for  input(s): CKTOTAL, CKMB, CKMBINDEX, TROPONINI  in the last 168 hours.  BNP (last 3 results) No results for input(s): PROBNP in the last 8760 hours. CBG: No results for input(s): GLUCAP in the last 168 hours. D-Dimer: No results for input(s): DDIMER in the last 72 hours. Hgb A1c: No results for input(s): HGBA1C in the last 72 hours. Lipid Profile: No results for input(s): CHOL, HDL, LDLCALC, TRIG, CHOLHDL, LDLDIRECT in the last 72 hours. Thyroid  function studies: No results for input(s): TSH, T4TOTAL, T3FREE, THYROIDAB in the last 72 hours.  Invalid input(s): FREET3 Anemia work up: No results for input(s): VITAMINB12, FOLATE, FERRITIN, TIBC, IRON, RETICCTPCT in the last 72 hours. Sepsis Labs: Recent Labs  Lab 08/30/23 1025 08/31/23 0515 09/01/23 0514 09/02/23 0415  WBC 3.7* 2.7* 2.8* 2.5*    Microbiology No results found for this or any previous visit (from the past 240 hours).  Procedures and diagnostic studies:  US  ASCITES (ABDOMEN LIMITED) Result Date: 09/02/2023 CLINICAL DATA:  83 year old female with a history of cirrhosis, splenomegaly, esophageal varices, and recent imaging concerning for colonic mass/malignancy. Diagnostic and therapeutic paracentesis requested; however, patient's family requests to defer paracentesis and proceed with limited ultrasound instead. EXAM: ULTRASOUND ABDOMEN LIMITED COMPARISON:  CT C/A/P - 08/26/23 FINDINGS: Limited ultrasound done of all 4 quadrants. There is a small volume of ascites present with pockets large enough for paracentesis should family agree to procedure in the future. Patient is to be started on IV Lasix  in the meantime for hopeful non-invasive resolution of fluid. IMPRESSION: Small volume ascites. Ultrasound performed by: Sherrilee Bal, PA-C Electronically Signed   By: Marcey Moan M.D.   On: 09/02/2023 16:31               LOS: 8 days   Dorrie Cocuzza  [facility]   Pager on [email or web address]. If 7PM-7AM, please  contact night-coverage at [email or web address]     09/03/2023, 4:40 PM

## 2023-09-03 NOTE — Op Note (Signed)
 [facility]   OPERATIVE NOTE    PRE-OPERATIVE DIAGNOSIS:  PE with GI bleed and anemia of blood loss  POST-OPERATIVE DIAGNOSIS: Same  PROCEDURE: 1.   Ultrasound guidance for vascular access to the right common femoral vein 2.   Catheter placement into the inferior vena cava 3.   Inferior venacavogram 4.   Placement of a argon IVC filter  SURGEON: Cordella Shawl  ASSISTANT(S): None  ANESTHESIA: Conscious sedation was administered by the interventional radiology RN under my direct supervision. IV Versed  plus fentanyl  were utilized. Continuous ECG, pulse oximetry and blood pressure was monitored throughout the entire procedure. Conscious sedation was for a total of 10 minutes.  ESTIMATED BLOOD LOSS: minimal  FINDING(S): 1.  Patent IVC  SPECIMEN(S):  none  INDICATIONS:   Jenna Franklin is a 83 y.o. y.o. female who presents with anemia secondary to GI blood loss in association with a pulmonary embolism.  Patient cannot continue anticoagulation inferior vena cava filter is indicated for this reason.  Risks and benefits including filter thrombosis, migration, fracture, bleeding, and infection were all discussed.  We discussed that all IVC filters that we place can be removed if desired from the patient once the need for the filter has passed.    DESCRIPTION: After obtaining full informed written consent, the patient was brought back to the vascular suite. The skin was sterilely prepped and draped in a sterile surgical field was created. Ultrasound was placed in a sterile sleeve. The right common femoral vein was echolucent and compressible indicating patency. Image was recorded for the permanent record. The puncture was made under continuous real-time ultrasound guidance.  The right common femoral vein was accessed under direct ultrasound guidance without difficulty with a micropuncture needle. Microwire was then advanced under fluoroscopic guidance without  difficulty. Micro-sheath was then inserted and a J-wire was then placed. The dilator is passed over the wire and the delivery sheath was placed into the inferior vena cava.  Inferior venacavogram was performed. This demonstrated a patent IVC with the level of the renal veins at L2.  The filter was then deployed into the inferior vena cava at the level of  just below the renal veins. The delivery sheath was then removed. Pressure was held. Sterile dressings were placed. The patient tolerated the procedure well and was taken to the recovery room in stable condition.  Interpretation: Inferior vena cava is widely patent measures approximately 20 mm in diameter.  [place] filter is deployed in the upright position at the L3 level.  COMPLICATIONS: None  CONDITION: Stable  Cordella Shawl  09/03/2023, 6:17 PM

## 2023-09-03 NOTE — Plan of Care (Signed)

## 2023-09-03 NOTE — Progress Notes (Addendum)
 Verbal order from MD Ayiku to hold SCDs due to unknown DVT in lower extremities. Pt returned from procedure, confused but alert. Right groin site with no drainage, swelling, or redness. Family called and updated about return.

## 2023-09-03 NOTE — Plan of Care (Addendum)
 Dr. Jens updated that pt has had 3 loose dark stools so far in this shift with night shift also reporting dark tarry stools.     Problem: Education: Goal: Knowledge of General Education information will improve Description: Including pain rating scale, medication(s)/side effects and non-pharmacologic comfort measures Outcome: Progressing   Problem: Health Behavior/Discharge Planning: Goal: Ability to manage health-related needs will improve Outcome: Progressing   Problem: Clinical Measurements: Goal: Ability to maintain clinical measurements within normal limits will improve Outcome: Progressing Goal: Will remain free from infection Outcome: Progressing Goal: Diagnostic test results will improve Outcome: Progressing Goal: Respiratory complications will improve Outcome: Progressing Goal: Cardiovascular complication will be avoided Outcome: Progressing   Problem: Activity: Goal: Risk for activity intolerance will decrease Outcome: Progressing   Problem: Nutrition: Goal: Adequate nutrition will be maintained Outcome: Progressing   Problem: Coping: Goal: Level of anxiety will decrease Outcome: Progressing   Problem: Elimination: Goal: Will not experience complications related to bowel motility Outcome: Progressing Goal: Will not experience complications related to urinary retention Outcome: Progressing   Problem: Pain Managment: Goal: General experience of comfort will improve and/or be controlled Outcome: Progressing   Problem: Safety: Goal: Ability to remain free from injury will improve Outcome: Progressing   Problem: Skin Integrity: Goal: Risk for impaired skin integrity will decrease Outcome: Progressing   Problem: Education: Goal: Verbalization of understanding the information provided (i.e., activity precautions, restrictions, etc) will improve Outcome: Progressing Goal: Individualized Educational Video(s) Outcome: Progressing   Problem:  Activity: Goal: Ability to ambulate and perform ADLs will improve Outcome: Progressing   Problem: Clinical Measurements: Goal: Postoperative complications will be avoided or minimized Outcome: Progressing   Problem: Self-Concept: Goal: Ability to maintain and perform role responsibilities to the fullest extent possible will improve Outcome: Progressing   Problem: Pain Management: Goal: Pain level will decrease Outcome: Progressing

## 2023-09-03 NOTE — Consult Note (Signed)
 MRN : [id number]  Hennesy Sobalvarro is a 83 y.o. (13-Jun-1940) female who presents with chief complaint of legs hurt and swell.  History of Present Illness:   I am asked to evaluate the patient by Dr. Jens.  Patient presented to [facility] August 25, 2024 with a hip fracture.  Hip fracture was repaired August 25, 2021.  A CT angio was performed 08/29/2023 secondary to increasing respiratory symptoms which demonstrated a pulmonary embolism.  The CT scan has been reviewed by me personally confirming pulmonary embolism.  Also identified on that study was cirrhosis of the liver associated with splenomegaly and esophageal varices.  Also complicating the situation a CT scan dated August 26, 2023 noted a significant colonic mass consistent with a malignancy associated with multiple nodules suggesting metastatic disease.  At the time there was no indication for pulmonary thrombectomy and heparin  was initiated.  Unfortunately she has had a steadily declining hemoglobin.  Eliquis  was started but this seemed to amplify the situation and I am now asked to evaluate for further treatment.  Current Meds  Medication Sig   nadolol  (CORGARD ) 20 MG tablet TAKE (1) TABLET BY MOUTH EVERY DAY    Past Medical History:  Diagnosis Date   Adjustment disorder with anxious mood 10/05/2013   Anxiety    Arthritis    back   Bronchitis    currently   Calculus of kidney 03/25/2015   CHF (congestive heart failure) (HCC)    pt says she thinks she has this   Cirrhosis of liver (HCC) 2016   Depression    Esophageal varices (HCC)    Esophageal varices (HCC)    Gastritis without bleeding 2019   GERD (gastroesophageal reflux disease)    H/O: depression    History of kidney stones    Hx of sinus tachycardia 07/06/2017   Hypertension    Nerve root pain 12/11/2011   Non-alcoholic fatty liver disease    Nonalcoholic fatty liver disease    Osteopenia    Osteopenia    Reflux    in past   Spinal  stenosis    Tachycardia 03/25/2015   Tendinopathy of right gluteus medius 01/01/2017   Vertigo    3 yrs ago   Vitamin D deficiency    Vitamin D deficiency     Past Surgical History:  Procedure Laterality Date   ABDOMINAL HYSTERECTOMY     APPENDECTOMY     BREAST BIOPSY Right    neg needle bx   CATARACT EXTRACTION W/PHACO Right 01/09/2019   Procedure: CATARACT EXTRACTION PHACO AND INTRAOCULAR LENS PLACEMENT (IOC) RIGHT 8.60,        00:53.3;  Surgeon: Myrna Adine Anes, MD;  Location: [facility];  Service: Ophthalmology;  Laterality: Right;   CATARACT EXTRACTION W/PHACO Left 01/30/2019   Procedure: CATARACT EXTRACTION PHACO AND INTRAOCULAR LENS PLACEMENT (IOC) LEFT, 2.59, 00:25.5;  Surgeon: Myrna Adine Anes, MD;  Location: [facility];  Service: Ophthalmology;  Laterality: Left;   COMBINED HYSTERECTOMY ABDOMINAL W/ A&P REPAIR / OOPHORECTOMY     ESOPHAGOGASTRODUODENOSCOPY (EGD) WITH PROPOFOL  N/A 06/17/2015   Procedure: ESOPHAGOGASTRODUODENOSCOPY (EGD) WITH PROPOFOL ;  Surgeon: Rogelia Copping, MD;  Location: [facility];  Service: Endoscopy;  Laterality: N/A;   ESOPHAGOGASTRODUODENOSCOPY (EGD) WITH PROPOFOL  N/A 06/17/2017   Procedure: ESOPHAGOGASTRODUODENOSCOPY (EGD) WITH PROPOFOL ;  Surgeon: Copping Rogelia, MD;  Location: [facility];  Service: Endoscopy;  Laterality: N/A;   INTRAMEDULLARY (IM) NAIL INTERTROCHANTERIC Right 08/26/2023  Procedure: FIXATION, FRACTURE, INTERTROCHANTERIC, WITH INTRAMEDULLARY ROD;  Surgeon: Lorelle Hussar, MD;  Location: [facility];  Service: Orthopedics;  Laterality: Right;   LAPAROSCOPIC CHOLECYSTECTOMY     TONSILLECTOMY      Social History Social History   Tobacco Use   Smoking status: Never   Smokeless tobacco: Never   Tobacco comments:    smoking cessation materials not required  Vaping Use   Vaping status: Never Used  Substance Use Topics   Alcohol use: No    Alcohol/week: 0.0 standard drinks of alcohol   Drug use: No     Family History Family History  Problem Relation Age of Onset   Heart disease Mother    Heart disease Father    Heart attack Paternal Grandfather    Asthma Son 61   Hypertension Daughter    Breast cancer Maternal Aunt     Allergies  Allergen Reactions   Baclofen  Anxiety   Codeine Nausea And Vomiting   Sulfa Antibiotics Nausea Only   Prednisone  Anxiety     REVIEW OF SYSTEMS (Negative unless checked)  Constitutional: [] Weight loss  [] Fever  [] Chills Cardiac: [] Chest pain   [] Chest pressure   [] Palpitations   [] Shortness of breath when laying flat   [] Shortness of breath with exertion. Vascular:  [] Pain in legs with walking   [x] Pain in legs at rest  [] History of DVT   [] Phlebitis   [x] Swelling in legs   [] Varicose veins   [] Non-healing ulcers Pulmonary:   [] Uses home oxygen   [] Productive cough   [] Hemoptysis   [] Wheeze  [] COPD   [] Asthma Neurologic:  [] Dizziness   [] Seizures   [] History of stroke   [] History of TIA  [] Aphasia   [] Vissual changes   [] Weakness or numbness in arm   [] Weakness or numbness in leg Musculoskeletal:   [] Joint swelling   [] Joint pain   [] Low back pain Hematologic:  [] Easy bruising  [] Easy bleeding   [] Hypercoagulable state   [] Anemic Gastrointestinal:  [] Diarrhea   [] Vomiting  [] Gastroesophageal reflux/heartburn   [] Difficulty swallowing. Genitourinary:  [] Chronic kidney disease   [] Difficult urination  [] Frequent urination   [] Blood in urine Skin:  [] Rashes   [] Ulcers  Psychological:  [] History of anxiety   []  History of major depression.  Physical Examination  Vitals:   09/03/23 0800 09/03/23 0951 09/03/23 1215 09/03/23 1521  BP: 121/66 114/70 117/68 (!) 98/50  Pulse: 93  (!) 104 83  Resp: 19   16  Temp: 98 F (36.7 C)  98.8 F (37.1 C) 98.2 F (36.8 C)  TempSrc:   Oral   SpO2: 98%  98% 97%  Weight:      Height:       Body mass index is 22.07 kg/m. Gen: WD/cachectic, mild distress Head: [place]/AT, positive temporalis wasting.   Ear/Nose/Throat: Hearing grossly intact, nares w/o erythema or drainage, pinna without lesions Eyes: PER, EOMI  Neck: Supple, no gross masses.  No JVD.  Pulmonary:  poor air movement, no audible wheezing, no use of accessory muscles.  Cardiac: RRR, precordium not hyperdynamic. Vascular:   2+ soft pitting edema. CEAP C4sEpAsPr   Vessel Right Left  Radial Palpable Palpable  Gastrointestinal: firm, distended.  Ascites.  Musculoskeletal: M/S 5/5 throughout.  No deformity.  Neurologic: CN 2-12 intact. Pain and light touch intact in extremities.  Symmetrical.  Speech is poor. Motor exam as listed above. Psychiatric: Judgment poor, Mood & affect appropriate for pt's clinical situation. Dermatologic: Venous rashes no ulcers noted.  No changes consistent with  cellulitis. Lymph : No lichenification or skin changes of chronic lymphedema.  CBC Lab Results  Component Value Date   WBC 2.5 (L) 09/02/2023   HGB 9.4 (L) 09/03/2023   HCT 29.1 (L) 09/03/2023   MCV 91.6 09/02/2023   PLT 107 (L) 09/02/2023    BMET    Component Value Date/Time   NA 138 09/02/2023 0415   NA 141 06/10/2021 1520   K 3.9 09/02/2023 0415   CL 109 09/02/2023 0415   CO2 23 09/02/2023 0415   GLUCOSE 177 (H) 09/02/2023 0415   BUN 14 09/02/2023 0415   BUN 16 06/10/2021 1520   CREATININE 0.55 09/02/2023 0415   CALCIUM 8.3 (L) 09/02/2023 0415   GFRNONAA >60 09/02/2023 0415   GFRAA >60 09/06/2019 0853   Estimated Creatinine Clearance: 44.1 mL/min (by C-G formula based on SCr of 0.55 mg/dL).  COAG Lab Results  Component Value Date   INR 1.3 (H) 08/29/2023   INR 1.2 08/19/2020   INR 1.2 05/30/2015    Radiology US  ASCITES (ABDOMEN LIMITED) Result Date: 09/02/2023 CLINICAL DATA:  83 year old female with a history of cirrhosis, splenomegaly, esophageal varices, and recent imaging concerning for colonic mass/malignancy. Diagnostic and therapeutic paracentesis requested; however, patient's family requests to defer  paracentesis and proceed with limited ultrasound instead. EXAM: ULTRASOUND ABDOMEN LIMITED COMPARISON:  CT C/A/P - 08/26/23 FINDINGS: Limited ultrasound done of all 4 quadrants. There is a small volume of ascites present with pockets large enough for paracentesis should family agree to procedure in the future. Patient is to be started on IV Lasix  in the meantime for hopeful non-invasive resolution of fluid. IMPRESSION: Small volume ascites. Ultrasound performed by: Sherrilee Bal, PA-C Electronically Signed   By: Marcey Moan M.D.   On: 09/02/2023 16:31   ECHOCARDIOGRAM COMPLETE Result Date: 08/31/2023    ECHOCARDIOGRAM REPORT   Patient Name:   Tequila MUNN Ponciano Date of Exam: 08/30/2023 Medical Rec #:  [id number]          Height:       63.0 in Accession #:    [id number]         Weight:       124.6 lb Date of Birth:  Aug 02, 1940          BSA:          1.581 m Patient Age:    83 years           BP:           105/58 mmHg Patient Gender: F                  HR:           82 bpm. Exam Location:  [facility] Procedure: 2D Echo, Cardiac Doppler and Color Doppler (Both Spectral and Color            Flow Doppler were utilized during procedure). Indications:     Pulmonary embolus I26.09  History:         Patient has prior history of Echocardiogram examinations, most                  recent 11/05/2005. CHF; Risk Factors:Hypertension. Anxiety.  Sonographer:     Christopher Furnace Referring Phys:  [id number] JAN A MANSY Diagnosing Phys: Dwayne D Callwood MD  Sonographer Comments: Technically challenging study due to limited acoustic windows, no apical window and no subcostal window. IMPRESSIONS  1. Left ventricular ejection fraction, by estimation, is 55 to 60%. The left ventricle  has normal function. The left ventricle has no regional wall motion abnormalities. Left ventricular diastolic function could not be evaluated.  2. Right ventricular systolic function is normal. The right ventricular size is normal.  3. The mitral valve is normal in  structure. Trivial mitral valve regurgitation.  4. The aortic valve is normal in structure. Aortic valve regurgitation is not visualized. FINDINGS  Left Ventricle: Left ventricular ejection fraction, by estimation, is 55 to 60%. The left ventricle has normal function. The left ventricle has no regional wall motion abnormalities. Strain was performed and the global longitudinal strain is indeterminate. The left ventricular internal cavity size was normal in size. There is borderline left ventricular hypertrophy. Left ventricular diastolic function could not be evaluated. Right Ventricle: The right ventricular size is normal. No increase in right ventricular wall thickness. Right ventricular systolic function is normal. Left Atrium: Left atrial size was normal in size. Right Atrium: Right atrial size was normal in size. Pericardium: There is no evidence of pericardial effusion. Mitral Valve: The mitral valve is normal in structure. Trivial mitral valve regurgitation. Tricuspid Valve: The tricuspid valve is normal in structure. Tricuspid valve regurgitation is mild. Aortic Valve: The aortic valve is normal in structure. Aortic valve regurgitation is not visualized. Pulmonic Valve: The pulmonic valve was grossly normal. Pulmonic valve regurgitation is not visualized. Aorta: The ascending aorta was not well visualized. IAS/Shunts: No atrial level shunt detected by color flow Doppler. Additional Comments: 3D was performed not requiring image post processing on an independent workstation and was indeterminate.  LEFT VENTRICLE PLAX 2D LVIDd:         3.90 cm LVIDs:         2.80 cm LV PW:         1.00 cm LV IVS:        1.10 cm LVOT diam:     2.00 cm LVOT Area:     3.14 cm  LEFT ATRIUM         Index LA diam:    3.30 cm 2.09 cm/m   AORTA Ao Root diam: 2.90 cm  SHUNTS Systemic Diam: 2.00 cm Cara JONETTA Lovelace MD Electronically signed by Cara JONETTA Lovelace MD Signature Date/Time: 08/31/2023/7:29:38 AM    Final    CT Angio Chest  Pulmonary Embolism (PE) W or WO Contrast Addendum Date: 08/29/2023 ADDENDUM REPORT: 08/29/2023 23:09 ADDENDUM: These results were called by telephone at the time of interpretation on 08/29/2023 at 10:11 p.m. to provider Dr. JINNY Peaches, who verbally acknowledged these results. Electronically Signed   By: Greig Pique M.D.   On: 08/29/2023 23:09   Result Date: 08/29/2023 CLINICAL DATA:  High probability for PE EXAM: CT ANGIOGRAPHY CHEST WITH CONTRAST TECHNIQUE: Multidetector CT imaging of the chest was performed using the standard protocol during bolus administration of intravenous contrast. Multiplanar CT image reconstructions and MIPs were obtained to evaluate the vascular anatomy. RADIATION DOSE REDUCTION: This exam was performed according to the departmental dose-optimization program which includes automated exposure control, adjustment of the mA and/or kV according to patient size and/or use of iterative reconstruction technique. CONTRAST:  75mL OMNIPAQUE  IOHEXOL  350 MG/ML SOLN COMPARISON:  CT of the chest abdomen and pelvis 08/26/2023. the FINDINGS: Cardiovascular: There are segmental and subsegmental pulmonary emboli in the right lower lobe. No other pulmonary emboli are seen. The heart and aorta are normal in size. There is no pericardial effusion. There are atherosclerotic calcifications of the aorta. There is an aberrant right subclavian artery. Mediastinum/Nodes: No enlarged mediastinal,  hilar, or axillary lymph nodes. Thyroid  gland, trachea, and esophagus demonstrate no significant findings. Lungs/Pleura: There are trace bilateral pleural effusions with a small amount of atelectasis in the bilateral lower lobes. There is a 2 mm nodule in the superior segment of the left lower lobe image 6/40. There is a stable right middle lobe nodule measuring 7 mm. The lungs are otherwise clear. Upper Abdomen: There is heterogeneous nodular liver contour. The spleen is enlarged. There is a moderate amount of ascites in  the upper abdomen. Musculoskeletal: No acute fractures are identified. T12 chronic compression deformity is unchanged. Review of the MIP images confirms the above findings. IMPRESSION: 1. Segmental and subsegmental pulmonary emboli in the right lower lobe. There is evidence for right heart strain (RV/LV Ratio = 1.2) consistent with at least submassive (intermediate risk) PE. The presence of right heart strain has been associated with an increased risk of morbidity and mortality. 2. Trace bilateral pleural effusions with small amount of atelectasis in the lower lobes. 3. Stable 7 mm right middle lobe nodule. 2 mm left lower lobe nodule. 4. Cirrhotic liver with splenomegaly and moderate ascites in the upper abdomen. Aortic Atherosclerosis (ICD10-I70.0). Electronically Signed: By: Greig Pique M.D. On: 08/29/2023 22:02   DG HIP UNILAT WITH PELVIS 2-3 VIEWS RIGHT Result Date: 08/26/2023 CLINICAL DATA:  ORIF of the right hip. EXAM: DG HIP (WITH OR WITHOUT PELVIS) 2-3V RIGHT COMPARISON:  Right hip radiograph dated 08/25/2025. FINDINGS: Six intraoperative fluoroscopic spot images provided. The total fluoroscopic time is 1 minute, 22 seconds with a cumulative air Karma of 17.06 mGy. Status post ORIF of the right femur. IMPRESSION: Intraoperative fluoroscopic guidance for ORIF of the right femur. Electronically Signed   By: Vanetta Chou M.D.   On: 08/26/2023 17:20   DG C-Arm 1-60 Min-No Report Result Date: 08/26/2023 Fluoroscopy was utilized by the requesting physician.  No radiographic interpretation.   CT CHEST ABDOMEN PELVIS WO CONTRAST Result Date: 08/26/2023 CLINICAL DATA:  Trauma. EXAM: CT CHEST, ABDOMEN AND PELVIS WITHOUT CONTRAST TECHNIQUE: Multidetector CT imaging of the chest, abdomen and pelvis was performed following the standard protocol without IV contrast. RADIATION DOSE REDUCTION: This exam was performed according to the departmental dose-optimization program which includes automated exposure  control, adjustment of the mA and/or kV according to patient size and/or use of iterative reconstruction technique. COMPARISON:  None Available. FINDINGS: Evaluation of this exam is limited in the absence of intravenous contrast. CT CHEST FINDINGS Cardiovascular: There is no cardiomegaly or pericardial effusion. The thoracic aorta and the central pulmonary arteries are grossly unremarkable. There is a left-sided aortic arch with aberrant right subclavian artery anatomy. Mediastinum/Nodes: No hilar or mediastinal adenopathy. Small hiatal hernia. The esophagus is grossly unremarkable. No mediastinal fluid collection. Lungs/Pleura: Minimal bibasilar subpleural atelectasis. There is a 7 mm right middle lobe and a 4 mm right middle lobe nodules. A 9 mm nodular density at the right lung base along the diaphragm. No consolidative changes. There is no pleural effusion pneumothorax. The central airways are patent. Musculoskeletal: Osteopenia. Old T12 compression fracture. No acute osseous pathology. CT ABDOMEN PELVIS FINDINGS No intra-abdominal free air.  Small ascites. Hepatobiliary: Cirrhosis.  No biliary dilatation.  Cholecystectomy. Pancreas: Unremarkable. No pancreatic ductal dilatation or surrounding inflammatory changes. Spleen: Splenomegaly measuring 15 cm in length. Adrenals/Urinary Tract: The adrenal glands unremarkable. Small right renal upper pole cyst. There is no hydronephrosis or nephrolithiasis on either side. The visualized ureters and urinary bladder marked. Stomach/Bowel: There is sigmoid diverticulosis. Diffuse thickened appearance  of the colon may be related to hepatic colopathy and size. Colitis is not excluded. There is however an area of irregularity and thickening of the colonic wall with nodular protrusion involving the proximal transverse colon (axial 70/4 and coronal 28/7) concerning for malignancy. Further evaluation with colonoscopy recommended. There is no bowel obstruction. The appendix is not  well visualized due to ascites. Vascular/Lymphatic: Mild aortoiliac atherosclerotic disease. The IVC is unremarkable. No portal venous gas. No adenopathy. Several small nodules adjacent to the proximal transverse colon as well as small omental nodules in the right upper abdomen (64/4) metastasis. Reproductive: Hysterectomy. Other: Soft tissue nodules in the anterior abdomen and involving the right rectus sheath measure up to 3.8 x 2.0 cm consistent with metastatic implants. Musculoskeletal: Osteopenia with degenerative changes of the spine. Old L1 compression fracture with anterior wedging. There is mildly angulated fracture of the right femoral neck involving the basicervical and trochanteric region. IMPRESSION: 1. Mildly angulated fracture of the right femoral neck. 2. No acute/traumatic intrathoracic pathology. 3. Cirrhosis with splenomegaly and small ascites. 4. Sigmoid diverticulosis. 5. Findings concerning for colonic mass/malignancy of the proximal transverse colon with omental implants. Further evaluation with colonoscopy is recommended. 6. Small pulmonary nodules, metastatic disease is not excluded. 7.  Aortic Atherosclerosis (ICD10-I70.0). Electronically Signed   By: Vanetta Chou M.D.   On: 08/26/2023 14:58   CT T-SPINE NO CHARGE Result Date: 08/26/2023 CLINICAL DATA:  Trauma. EXAM: CT THORACIC AND LUMBAR SPINE WITHOUT CONTRAST TECHNIQUE: Multidetector CT imaging of the thoracic and lumbar spine was performed without contrast. Multiplanar CT image reconstructions were also generated. RADIATION DOSE REDUCTION: This exam was performed according to the departmental dose-optimization program which includes automated exposure control, adjustment of the mA and/or kV according to patient size and/or use of iterative reconstruction technique. COMPARISON:  Lumbar spine CT dated 08/19/2020. FINDINGS: CT THORACIC SPINE FINDINGS Alignment: No acute subluxation. Vertebrae: No acute fracture. The bones are  osteopenic. Old T12 compression fracture with approximately 50% loss of vertebral body height anteriorly and anterior wedging. This is relatively similar to the lumbar spine CT of 08/19/2020. Paraspinal and other soft tissues: Negative. Disc levels: No acute findings with degenerative changes. CT LUMBAR SPINE FINDINGS Segmentation: 5 lumbar type vertebrae. Alignment: No acute subluxation. Vertebrae: Old appearing L1 compression fracture with approximately 50% loss of vertebral body height and anterior wedging, new since the prior CT. No definite acute fracture. The bones are osteopenic. Paraspinal and other soft tissues: Negative. Disc levels: Degenerative changes. IMPRESSION: 1. No acute/traumatic thoracic or lumbar spine pathology. 2. Old T12 and L1 compression fractures. Electronically Signed   By: Vanetta Chou M.D.   On: 08/26/2023 14:47   CT L-SPINE NO CHARGE Result Date: 08/26/2023 CLINICAL DATA:  Trauma. EXAM: CT THORACIC AND LUMBAR SPINE WITHOUT CONTRAST TECHNIQUE: Multidetector CT imaging of the thoracic and lumbar spine was performed without contrast. Multiplanar CT image reconstructions were also generated. RADIATION DOSE REDUCTION: This exam was performed according to the departmental dose-optimization program which includes automated exposure control, adjustment of the mA and/or kV according to patient size and/or use of iterative reconstruction technique. COMPARISON:  Lumbar spine CT dated 08/19/2020. FINDINGS: CT THORACIC SPINE FINDINGS Alignment: No acute subluxation. Vertebrae: No acute fracture. The bones are osteopenic. Old T12 compression fracture with approximately 50% loss of vertebral body height anteriorly and anterior wedging. This is relatively similar to the lumbar spine CT of 08/19/2020. Paraspinal and other soft tissues: Negative. Disc levels: No acute findings with degenerative changes. CT LUMBAR  SPINE FINDINGS Segmentation: 5 lumbar type vertebrae. Alignment: No acute  subluxation. Vertebrae: Old appearing L1 compression fracture with approximately 50% loss of vertebral body height and anterior wedging, new since the prior CT. No definite acute fracture. The bones are osteopenic. Paraspinal and other soft tissues: Negative. Disc levels: Degenerative changes. IMPRESSION: 1. No acute/traumatic thoracic or lumbar spine pathology. 2. Old T12 and L1 compression fractures. Electronically Signed   By: Vanetta Chou M.D.   On: 08/26/2023 14:47   DG Elbow Complete Right Result Date: 08/26/2023 CLINICAL DATA:  Fall. EXAM: RIGHT ELBOW - COMPLETE 3+ VIEW COMPARISON:  08/19/2020. FINDINGS: There is no evidence of fracture, dislocation, or joint effusion. Mild degenerative spurring of the coronoid process of the ulna. Soft tissues are unremarkable. IMPRESSION: No acute fracture or dislocation of the right elbow. Electronically Signed   By: Harrietta Sherry M.D.   On: 08/26/2023 14:08   DG FEMUR, MIN 2 VIEWS RIGHT Result Date: 08/26/2023 CLINICAL DATA:  Right hip pain after fall. EXAM: RIGHT FEMUR 2 VIEWS COMPARISON:  Same-day pelvic radiographs dated 08/26/2023 at 12:03 p.m. FINDINGS: Diffuse osseous demineralization. Redemonstrated probable basicervical fracture of the right proximal femur. Intertrochanteric extension cannot be excluded. No additional fracture identified. No dislocation. Peripheral vascular calcifications. IMPRESSION: Redemonstrated probable basicervical fracture of the right proximal femur. Intertrochanteric extension cannot be excluded. No additional fracture identified. Electronically Signed   By: Harrietta Sherry M.D.   On: 08/26/2023 14:06   CT Head Wo Contrast Result Date: 08/26/2023 CLINICAL DATA:  Altered mental status.  Fall. EXAM: CT HEAD WITHOUT CONTRAST CT CERVICAL SPINE WITHOUT CONTRAST TECHNIQUE: Multidetector CT imaging of the head and cervical spine was performed following the standard protocol without intravenous contrast. Multiplanar CT image  reconstructions of the cervical spine were also generated. RADIATION DOSE REDUCTION: This exam was performed according to the departmental dose-optimization program which includes automated exposure control, adjustment of the mA and/or kV according to patient size and/or use of iterative reconstruction technique. COMPARISON:  None Available. FINDINGS: CT HEAD FINDINGS Brain: No acute intracranial hemorrhage. No focal mass lesion. No CT evidence of acute infarction. No midline shift or mass effect. No hydrocephalus. Basilar cisterns are patent. There are periventricular and subcortical white matter hypodensities. Generalized cortical atrophy. Vascular: No hyperdense vessel or unexpected calcification. Skull: Normal. Negative for fracture or focal lesion. Sinuses/Orbits: Paranasal sinuses and mastoid air cells are clear. Orbits are clear. Other: None. CT CERVICAL SPINE FINDINGS Alignment: Normal alignment of the cervical vertebral bodies. Skull base and vertebrae: Normal craniocervical junction. No loss of vertebral body height or disc height. Normal facet articulation. No evidence of fracture. Soft tissues and spinal canal: No prevertebral soft tissue swelling. No perispinal or epidural hematoma. Disc levels: Bulky RIGHT facet hypertrophy from C3 to C5. Anterior endplate spurring R4-R2. No acute findings Upper chest: Clear Other: None IMPRESSION: HEAD CT: 1. No acute intracranial findings. 2. Atrophy and white matter microvascular disease. CERVICAL SPINE CT: 1. No cervical spine fracture. 2. Cervical spondylosis. Electronically Signed   By: Jackquline Boxer M.D.   On: 08/26/2023 12:32   CT Cervical Spine Wo Contrast Result Date: 08/26/2023 CLINICAL DATA:  Altered mental status.  Fall. EXAM: CT HEAD WITHOUT CONTRAST CT CERVICAL SPINE WITHOUT CONTRAST TECHNIQUE: Multidetector CT imaging of the head and cervical spine was performed following the standard protocol without intravenous contrast. Multiplanar CT image  reconstructions of the cervical spine were also generated. RADIATION DOSE REDUCTION: This exam was performed according to the departmental dose-optimization program which  includes automated exposure control, adjustment of the mA and/or kV according to patient size and/or use of iterative reconstruction technique. COMPARISON:  None Available. FINDINGS: CT HEAD FINDINGS Brain: No acute intracranial hemorrhage. No focal mass lesion. No CT evidence of acute infarction. No midline shift or mass effect. No hydrocephalus. Basilar cisterns are patent. There are periventricular and subcortical white matter hypodensities. Generalized cortical atrophy. Vascular: No hyperdense vessel or unexpected calcification. Skull: Normal. Negative for fracture or focal lesion. Sinuses/Orbits: Paranasal sinuses and mastoid air cells are clear. Orbits are clear. Other: None. CT CERVICAL SPINE FINDINGS Alignment: Normal alignment of the cervical vertebral bodies. Skull base and vertebrae: Normal craniocervical junction. No loss of vertebral body height or disc height. Normal facet articulation. No evidence of fracture. Soft tissues and spinal canal: No prevertebral soft tissue swelling. No perispinal or epidural hematoma. Disc levels: Bulky RIGHT facet hypertrophy from C3 to C5. Anterior endplate spurring R4-R2. No acute findings Upper chest: Clear Other: None IMPRESSION: HEAD CT: 1. No acute intracranial findings. 2. Atrophy and white matter microvascular disease. CERVICAL SPINE CT: 1. No cervical spine fracture. 2. Cervical spondylosis. Electronically Signed   By: Jackquline Boxer M.D.   On: 08/26/2023 12:32   DG Chest Portable 1 View Result Date: 08/26/2023 CLINICAL DATA:  Fall, right hip fracture EXAM: PORTABLE CHEST 1 VIEW COMPARISON:  08/19/2020 FINDINGS: The patient is rotated to the left on today's radiograph, reducing diagnostic sensitivity and specificity. Low lung volumes are present, causing crowding of the pulmonary  vasculature. Age indeterminate deformity of the left sixth rib posterolaterally, correlate with any tenderness in this vicinity in determining acuity. Accounting for the low lung volumes, the lungs appear clear. Cardiac and mediastinal margins appear normal. IMPRESSION: 1. Age indeterminate fracture of the left sixth rib posterolaterally, correlate with any tenderness in this vicinity in determining acuity. 2. Low lung volumes. Electronically Signed   By: Ryan Salvage M.D.   On: 08/26/2023 12:26   DG Hip Unilat With Pelvis 2-3 Views Right Result Date: 08/26/2023 CLINICAL DATA:  Fall, right hip pain EXAM: DG HIP (WITH OR WITHOUT PELVIS) 2-3V RIGHT COMPARISON:  None Available. FINDINGS: Suboptimal positioning. High suspicion for fracture, either basicervical or intertrochanteric. Intra-articular femoral neck fracture favored but not absolutely certain. Consider CT of the right hip for differentiation. Spurring of both acetabula.  Bony demineralization. IMPRESSION: 1. Right proximal femoral fracture likely basicervical but possibly intertrochanteric. CT could help differentiate. 2. Bony demineralization. 3. Spurring of both acetabula. Electronically Signed   By: Ryan Salvage M.D.   On: 08/26/2023 12:24     Assessment/Plan PE in the face of GI bleed with likely large colon malignancy in association with cirrhosis splenomegaly and esophageal varices: Patient is not tolerating anticoagulation.  The diagnoses of malignancy and to some degree cirrhosis are relatively new and the family still grappling with these issues.  We do not have tissue to prove malignancy.  At this point anticoagulation must be stopped.  Therefore I recommend IVC filter placement as this is a very simple straightforward procedure and will prevent any further pulmonary emboli while other issues can be resolved and a more complete plan made.  I have discussed this with the daughter as the patient is confused.  The risks and  benefits were discussed with her all questions were answered patient has agreed to proceed.   Cordella Shawl, MD  09/03/2023 5:09 PM

## 2023-09-04 ENCOUNTER — Inpatient Hospital Stay

## 2023-09-04 DIAGNOSIS — I2699 Other pulmonary embolism without acute cor pulmonale: Secondary | ICD-10-CM | POA: Diagnosis not present

## 2023-09-04 DIAGNOSIS — S72001A Fracture of unspecified part of neck of right femur, initial encounter for closed fracture: Secondary | ICD-10-CM | POA: Diagnosis not present

## 2023-09-04 DIAGNOSIS — Z515 Encounter for palliative care: Secondary | ICD-10-CM | POA: Diagnosis not present

## 2023-09-04 DIAGNOSIS — K922 Gastrointestinal hemorrhage, unspecified: Secondary | ICD-10-CM | POA: Diagnosis not present

## 2023-09-04 DIAGNOSIS — I8510 Secondary esophageal varices without bleeding: Secondary | ICD-10-CM

## 2023-09-04 LAB — COMPREHENSIVE METABOLIC PANEL WITH GFR
ALT: 25 U/L (ref 0–44)
AST: 47 U/L — ABNORMAL HIGH (ref 15–41)
Albumin: 2.6 g/dL — ABNORMAL LOW (ref 3.5–5.0)
Alkaline Phosphatase: 88 U/L (ref 38–126)
Anion gap: 8 (ref 5–15)
BUN: 18 mg/dL (ref 8–23)
CO2: 24 mmol/L (ref 22–32)
Calcium: 8.4 mg/dL — ABNORMAL LOW (ref 8.9–10.3)
Chloride: 105 mmol/L (ref 98–111)
Creatinine, Ser: 0.57 mg/dL (ref 0.44–1.00)
GFR, Estimated: >60 mL/min (ref >60)
Glucose, Bld: 168 mg/dL — ABNORMAL HIGH (ref 70–99)
Potassium: 3.6 mmol/L (ref 3.5–5.1)
Sodium: 137 mmol/L (ref 135–145)
Total Bilirubin: 1.5 mg/dL — ABNORMAL HIGH (ref 0.0–1.2)
Total Protein: 5.9 g/dL — ABNORMAL LOW (ref 6.5–8.1)

## 2023-09-04 LAB — CBC
HCT: 27 % — ABNORMAL LOW (ref 36.0–46.0)
Hemoglobin: 9 g/dL — ABNORMAL LOW (ref 12.0–15.0)
MCH: 30 pg (ref 26.0–34.0)
MCHC: 33.3 g/dL (ref 30.0–36.0)
MCV: 90 fL (ref 80.0–100.0)
Platelets: 120 K/uL — ABNORMAL LOW (ref 150–400)
RBC: 3 MIL/uL — ABNORMAL LOW (ref 3.87–5.11)
RDW: 16.9 % — ABNORMAL HIGH (ref 11.5–15.5)
WBC: 4.4 K/uL (ref 4.0–10.5)
nRBC: 0 % (ref 0.0–0.2)

## 2023-09-04 MED ORDER — FUROSEMIDE 20 MG PO TABS
20.0000 mg | ORAL_TABLET | Freq: Every day | ORAL | Status: DC
  Administered 2023-09-05 – 2023-09-06 (×2): 20 mg via ORAL
  Filled 2023-09-04 (×2): qty 1

## 2023-09-04 NOTE — Progress Notes (Signed)
 1 Day Post-Op   Subjective/Chief Complaint: Lethargic but arousable.   Objective: Vital signs in last 24 hours: Temp:  [97.5 F (36.4 C)-99.4 F (37.4 C)] 97.6 F (36.4 C) (07/19 0732) Pulse Rate:  [0-123] 85 (07/19 0732) Resp:  [16-30] 17 (07/19 0732) BP: (98-130)/(50-68) 109/55 (07/19 0732) SpO2:  [93 %-98 %] 98 % (07/19 0732) Weight:  [56.5 kg] 56.5 kg (07/18 1737) Last BM Date : 09/02/23  Intake/Output from previous day: 07/18 0701 - 07/19 0700 In: 120 [P.O.:120] Out: 705 [Urine:705] Intake/Output this shift: No intake/output data recorded.  Extremities: RIGHT groin access site- soft, no hematoma  Lab Results:  Recent Labs    09/02/23 0415 09/03/23 0417 09/04/23 0501  WBC 2.5*  --  4.4  HGB 7.6* 9.4* 9.0*  HCT 24.1* 29.1* 27.0*  PLT 107*  --  120*   BMET Recent Labs    09/02/23 0415 09/04/23 0501  NA 138 137  K 3.9 3.6  CL 109 105  CO2 23 24  GLUCOSE 177* 168*  BUN 14 18  CREATININE 0.55 0.57  CALCIUM 8.3* 8.4*   PT/INR No results for input(s): LABPROT, INR in the last 72 hours. ABG No results for input(s): PHART, HCO3 in the last 72 hours.  Invalid input(s): PCO2, PO2  Studies/Results: PERIPHERAL VASCULAR CATHETERIZATION Result Date: 09/03/2023 See surgical note for result.  US  ASCITES (ABDOMEN LIMITED) Result Date: 09/02/2023 CLINICAL DATA:  83 year old female with a history of cirrhosis, splenomegaly, esophageal varices, and recent imaging concerning for colonic mass/malignancy. Diagnostic and therapeutic paracentesis requested; however, patient's family requests to defer paracentesis and proceed with limited ultrasound instead. EXAM: ULTRASOUND ABDOMEN LIMITED COMPARISON:  CT C/A/P - 08/26/23 FINDINGS: Limited ultrasound done of all 4 quadrants. There is a small volume of ascites present with pockets large enough for paracentesis should family agree to procedure in the future. Patient is to be started on IV Lasix  in the meantime for  hopeful non-invasive resolution of fluid. IMPRESSION: Small volume ascites. Ultrasound performed by: Sherrilee Bal, PA-C Electronically Signed   By: Marcey Moan M.D.   On: 09/02/2023 16:31    Anti-infectives: Anti-infectives (From admission, onward)    Start     Dose/Rate Route Frequency Ordered Stop   09/04/23 0000  ceFAZolin  (ANCEF ) IVPB 1 g/50 mL premix       Note to Pharmacy: Send with pt to OR   1 g 100 mL/hr over 30 Minutes Intravenous On call 09/03/23 1708 09/03/23 1900   08/26/23 2300  ceFAZolin  (ANCEF ) IVPB 2g/100 mL premix        2 g 200 mL/hr over 30 Minutes Intravenous Every 6 hours 08/26/23 2015 08/27/23 1059   08/26/23 1545  ceFAZolin  (ANCEF ) IVPB 2g/100 mL premix        2 g 200 mL/hr over 30 Minutes Intravenous  Once 08/26/23 1544 08/26/23 1628       Assessment/Plan: s/p Procedure(s): IVC FILTER INSERTION (N/A) Continue supportive care.  LOS: 9 days    Jenna Franklin A 09/04/2023

## 2023-09-04 NOTE — Plan of Care (Signed)

## 2023-09-04 NOTE — Progress Notes (Signed)
 Progress Note    Jenna Franklin  FMW:[id number] DOB: Jul 23, 1940  DOA: 08/26/2023 PCP: Justus Leita DEL, MD      Brief Narrative:    Medical records reviewed and are as summarized below:   Hospital course / significant events:   HPI: [name] is an 83 year old female with history of liver cirrhosis with splenomegaly and esophageal varices, anxiety, GERD, who presents emergency department for chief concerns of fall with resulting right hip pain.  07/10: admitted to hospitalist service. Underwent R hip fracture repair 07/11: PT/OT recs for SNF.  Discussion today with family regarding colon mass and options re further w/u  07/12: stable, await SNF thru the weekend 07/13: pt remains confused. Tachycardic into this afternoon worsening, dx new PE started on heparin   07/14: remains on heparin . Echo no RV strain. Ascites appears a bit worse today. Oncology consult - Dr Babara and I spoke w/ daughter [name], considering defer aggressive workup/treatment and focus on palliative/hospice, [name] will discuss further w/ family. At this time pt encephalopathic d/t hyperammonemia +/- underlying MCI/dementia and unable to participate in decisions.  07/15: ammonia normalized, pt still confused. Intermittently tachycardic, remains on heparin . Family seems to have definitively decided on NO biopsy as this will not really change course of care. Palliative consult ordered - plan at this point to treat the treatable, pursue rehab to hopefully at least get patients stronger / enhance QOL. Prognosis difficult - no real utility to PET and no real way to determine how quickly patient might decline. Still confused but a bit better - family would like to wait until tomorrow when pt's sone is getting into town to tall her about the mass etc.          Assessment/Plan:   Principal Problem:   Right femoral fracture (HCC) Active Problems:   Anxiety   Acid reflux   Cirrhosis of liver with ascites  (HCC)   Esophageal varices in cirrhosis (HCC)   Colonic mass   Weight loss   Protein-calorie malnutrition, severe   Closed fracture of right hip (HCC)   Hepatic encephalopathy (HCC)   Acute pulmonary embolism (HCC)   Acute GI bleeding   Nutrition Problem: Severe Malnutrition Etiology: chronic illness (cirrhosis)  Signs/Symptoms: percent weight loss, moderate fat depletion, severe fat depletion, moderate muscle depletion, severe muscle depletion   Body mass index is 22.06 kg/m.     Right femoral fracture Secondary to fall S/p Right Hip Intramedullary nail repair 08/26/2023 w/ Dr Lorelle Outpatient follow-up with [facility] clinic orthopedics 2 weeks postop Analgesics as needed. PT recommended discharge to SNF   Pulmonary Embolus No RV strain on echo Long-term anticoagulation contraindicated at this time because of GI bleeding. S/p IVC filter placement on 09/04/2023. Will consider SCD for DVT prophylaxis if venous duplex excludes DVT in the lower extremities   Acute GI bleeding .    Chart review shows that patient continues to have type VII stools described as brown, black and red.  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 Pulmonary nodule, malignancy cannot be excluded Weight loss Given CT read of colonic mass with omental implants and small pulmonary nodules, high suspicion for malignancy and metastatic disease cannot be excluded at this time Pt's son and daughter ask to defer conversation w/ patient for now until more lucid, pt certainly lacks capacity at this time to participate in discussions and decision making  Dr Jinny GI spoke 07/11 with patient's daughter, holding  off on colonoscopy for now and considering overall frailty and other comorbidities may not be a good candidate for aggressive workup / treatment but would recommend further discussion with oncology team Oncology consult 07/14 option for biopsy mass to help  dx/prognostication but overall pt is poor candidate for treatments [name], daughter, would prefer that patient can go to SNF and have PET scan as an outpatient to confirm metastatic disease before making any further decisions regarding additional investigations and treatment.     Cirrhosis complicated by Esophageal varices Ascites  Continue nadolol  Continue Lasix  and spironolactone .  Decrease Lasix  from 40 to 20 mg daily because of diarrhea and soft BP.  [name], daughter, declined paracentesis.   Acute metabolic encephalopathy d/t acute illness / SIRS / hepatic encephalopathy/delirium Underlying cognitive impairment suspect possibly dementia Pt does not have capacity to understand her medical situation / make complex decisions at this time   Continue lactulose  Mental status is improving but not back to baseline Daughter and son are involved in her decisions, daughter has been the point of contact which son is in agreement with   For now we are not informing patient of mass/cancer, per family request   Acute on chronic anemia Chronic thrombocytopenia H&H stable: 7.6-9.4-9.0.  Monitor H&H. S/p transfusion with 1 unit of PRBCs on 09/02/2023.   Anxiety Ativan  as needed   Constipation, chronic  Continue laxatives as needed Also on lactulose    Urinary retention  Foley catheter in place   Goals of care Treat what can be treated Patient remains full code for now.  Follow-up with palliative care team.      Diet Order             Diet regular Room service appropriate? Yes; Fluid consistency: Thin  Diet effective now                            Consultants: Magazine features editor Orthopedic surgeon Palliative care  Procedures: Right Hip Intramedullary nail repair 08/26/2023 w/ Dr Lorelle    Medications:    Chlorhexidine  Gluconate Cloth  6 each Topical Daily   docusate sodium   100 mg Oral BID   feeding supplement  237 mL Oral BID BM    furosemide   40 mg Oral Daily   lactulose   20 g Oral BID   multivitamin with minerals  1 tablet Oral Daily   nadolol   20 mg Oral Daily   pantoprazole  (PROTONIX ) IV  40 mg Intravenous Q12H   polyethylene glycol  17 g Oral BID   spironolactone   25 mg Oral Daily   Continuous Infusions:     Anti-infectives (From admission, onward)    Start     Dose/Rate Route Frequency Ordered Stop   09/04/23 0000  ceFAZolin  (ANCEF ) IVPB 1 g/50 mL premix       Note to Pharmacy: Send with pt to OR   1 g 100 mL/hr over 30 Minutes Intravenous On call 09/03/23 1708 09/03/23 1900   08/26/23 2300  ceFAZolin  (ANCEF ) IVPB 2g/100 mL premix        2 g 200 mL/hr over 30 Minutes Intravenous Every 6 hours 08/26/23 2015 08/27/23 1059   08/26/23 1545  ceFAZolin  (ANCEF ) IVPB 2g/100 mL premix        2 g 200 mL/hr over 30 Minutes Intravenous  Once 08/26/23 1544 08/26/23 1628              Family Communication/Anticipated D/C date and plan/Code Status  DVT prophylaxis: SCDs Start: 08/26/23 2015 Place TED hose Start: 08/26/23 1551     Code Status: Full Code  Family Communication: None Disposition Plan: Plan to discharge to SNF   Status is: Inpatient Remains inpatient appropriate because: Acute pulmonary embolism       Subjective:   Interval events noted.  No complaints.  She has no recollection of yesterday's events.  She does not remember if she had any procedure yesterday.  Sitter was at the bedside.  Objective:    Vitals:   09/03/23 1857 09/03/23 1950 09/04/23 0310 09/04/23 0732  BP: (!) 111/57 130/62 (!) 112/51 (!) 109/55  Pulse: 85 88 88 85  Resp: 18 18 16 17   Temp: 98.9 F (37.2 C) 99.4 F (37.4 C) (!) 97.5 F (36.4 C) 97.6 F (36.4 C)  TempSrc:  Oral    SpO2: 98% 97% 97% 98%  Weight:      Height:       No data found.   Intake/Output Summary (Last 24 hours) at 09/04/2023 1302 Last data filed at 09/03/2023 1936 Gross per 24 hour  Intake 120 ml  Output 150 ml  Net -30  ml   Filed Weights   08/26/23 1143 09/03/23 1737  Weight: 56.5 kg 56.5 kg    Exam:  GEN: NAD SKIN: Warm and dry EYES: No pallor or icterus ENT: MMM CV: RRR PULM: CTA B ABD: soft, distended, NT, +BS CNS: AAO x 2 (person and place), non focal EXT: No edema or tenderness       Data Reviewed:   I have personally reviewed following labs and imaging studies:  Labs: Labs show the following:   Basic Metabolic Panel: Recent Labs  Lab 08/30/23 1025 08/31/23 1233 09/01/23 0514 09/02/23 0415 09/04/23 0501  NA 134* 136 137 138 137  K 4.0 3.7 3.4* 3.9 3.6  CL 105 104 107 109 105  CO2 22 20* 24 23 24   GLUCOSE 215* 168* 181* 177* 168*  BUN 12 12 14 14 18   CREATININE 0.58 0.41* 0.55 0.55 0.57  CALCIUM 8.3* 8.2* 8.2* 8.3* 8.4*  MG  --   --   --  2.0  --   PHOS  --   --   --  2.1*  --    GFR Estimated Creatinine Clearance: 44.1 mL/min (by C-G formula based on SCr of 0.57 mg/dL). Liver Function Tests: Recent Labs  Lab 08/30/23 1025 08/31/23 1233 09/01/23 0514 09/02/23 0415 09/04/23 0501  AST 49* 48* 42* 39 47*  ALT 18 16 18 21 25   ALKPHOS 70 79 83 79 88  BILITOT 1.1 1.1 1.1 1.2 1.5*  PROT 6.2* 6.0* 5.3* 5.6* 5.9*  ALBUMIN 2.7* 2.6* 2.4* 2.6* 2.6*   No results for input(s): LIPASE, AMYLASE in the last 168 hours. Recent Labs  Lab 08/30/23 1025 08/31/23 1302 09/01/23 0517  AMMONIA 86* 28 36*   Coagulation profile Recent Labs  Lab 08/29/23 2250  INR 1.3*    CBC: Recent Labs  Lab 08/30/23 1025 08/31/23 0515 09/01/23 0514 09/02/23 0415 09/03/23 0417 09/04/23 0501  WBC 3.7* 2.7* 2.8* 2.5*  --  4.4  HGB 8.9* 8.4* 8.2* 7.6* 9.4* 9.0*  HCT 27.1* 26.1* 25.5* 24.1* 29.1* 27.0*  MCV 88.3 89.7 90.1 91.6  --  90.0  PLT 141* 117* 105* 107*  --  120*   Cardiac Enzymes: No results for input(s): CKTOTAL, CKMB, CKMBINDEX, TROPONINI in the last 168 hours.  BNP (last 3 results) No results for input(s): PROBNP in  the last 8760 hours. CBG: No  results for input(s): GLUCAP in the last 168 hours. D-Dimer: No results for input(s): DDIMER in the last 72 hours. Hgb A1c: No results for input(s): HGBA1C in the last 72 hours. Lipid Profile: No results for input(s): CHOL, HDL, LDLCALC, TRIG, CHOLHDL, LDLDIRECT in the last 72 hours. Thyroid  function studies: No results for input(s): TSH, T4TOTAL, T3FREE, THYROIDAB in the last 72 hours.  Invalid input(s): FREET3 Anemia work up: No results for input(s): VITAMINB12, FOLATE, FERRITIN, TIBC, IRON, RETICCTPCT in the last 72 hours. Sepsis Labs: Recent Labs  Lab 08/31/23 0515 09/01/23 0514 09/02/23 0415 09/04/23 0501  WBC 2.7* 2.8* 2.5* 4.4    Microbiology No results found for this or any previous visit (from the past 240 hours).  Procedures and diagnostic studies:  PERIPHERAL VASCULAR CATHETERIZATION Result Date: 09/03/2023 See surgical note for result.  US  ASCITES (ABDOMEN LIMITED) Result Date: 09/02/2023 CLINICAL DATA:  83 year old female with a history of cirrhosis, splenomegaly, esophageal varices, and recent imaging concerning for colonic mass/malignancy. Diagnostic and therapeutic paracentesis requested; however, patient's family requests to defer paracentesis and proceed with limited ultrasound instead. EXAM: ULTRASOUND ABDOMEN LIMITED COMPARISON:  CT C/A/P - 08/26/23 FINDINGS: Limited ultrasound done of all 4 quadrants. There is a small volume of ascites present with pockets large enough for paracentesis should family agree to procedure in the future. Patient is to be started on IV Lasix  in the meantime for hopeful non-invasive resolution of fluid. IMPRESSION: Small volume ascites. Ultrasound performed by: Sherrilee Bal, PA-C Electronically Signed   By: Marcey Moan M.D.   On: 09/02/2023 16:31               LOS: 9 days   Jenna Franklin  [facility]   Pager on [email or web address]. If 7PM-7AM, please contact night-coverage  at [email or web address]     09/04/2023, 1:02 PM
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
## 2023-09-05 DIAGNOSIS — Z7189 Other specified counseling: Secondary | ICD-10-CM | POA: Diagnosis not present

## 2023-09-05 DIAGNOSIS — Z515 Encounter for palliative care: Secondary | ICD-10-CM

## 2023-09-05 DIAGNOSIS — W19XXXA Unspecified fall, initial encounter: Secondary | ICD-10-CM | POA: Diagnosis not present

## 2023-09-05 DIAGNOSIS — Y92009 Unspecified place in unspecified non-institutional (private) residence as the place of occurrence of the external cause: Secondary | ICD-10-CM

## 2023-09-05 DIAGNOSIS — S72001A Fracture of unspecified part of neck of right femur, initial encounter for closed fracture: Secondary | ICD-10-CM | POA: Diagnosis not present

## 2023-09-05 LAB — CBC WITH DIFFERENTIAL/PLATELET
Abs Immature Granulocytes: 0.04 K/uL (ref 0.00–0.07)
Basophils Absolute: 0 K/uL (ref 0.0–0.1)
Basophils Relative: 0 %
Eosinophils Absolute: 0.1 K/uL (ref 0.0–0.5)
Eosinophils Relative: 2 %
HCT: 27.7 % — ABNORMAL LOW (ref 36.0–46.0)
Hemoglobin: 8.9 g/dL — ABNORMAL LOW (ref 12.0–15.0)
Immature Granulocytes: 1 %
Lymphocytes Relative: 17 %
Lymphs Abs: 0.8 K/uL (ref 0.7–4.0)
MCH: 29.1 pg (ref 26.0–34.0)
MCHC: 32.1 g/dL (ref 30.0–36.0)
MCV: 90.5 fL (ref 80.0–100.0)
Monocytes Absolute: 0.3 K/uL (ref 0.1–1.0)
Monocytes Relative: 6 %
Neutro Abs: 3.5 K/uL (ref 1.7–7.7)
Neutrophils Relative %: 74 %
Platelets: 131 K/uL — ABNORMAL LOW (ref 150–400)
RBC: 3.06 MIL/uL — ABNORMAL LOW (ref 3.87–5.11)
RDW: 17.5 % — ABNORMAL HIGH (ref 11.5–15.5)
WBC: 4.7 K/uL (ref 4.0–10.5)
nRBC: 0 % (ref 0.0–0.2)

## 2023-09-05 LAB — COMPREHENSIVE METABOLIC PANEL WITH GFR
ALT: 33 U/L (ref 0–44)
AST: 68 U/L — ABNORMAL HIGH (ref 15–41)
Albumin: 2.5 g/dL — ABNORMAL LOW (ref 3.5–5.0)
Alkaline Phosphatase: 93 U/L (ref 38–126)
Anion gap: 9 (ref 5–15)
BUN: 15 mg/dL (ref 8–23)
CO2: 20 mmol/L — ABNORMAL LOW (ref 22–32)
Calcium: 8.4 mg/dL — ABNORMAL LOW (ref 8.9–10.3)
Chloride: 104 mmol/L (ref 98–111)
Creatinine, Ser: 0.6 mg/dL (ref 0.44–1.00)
GFR, Estimated: >60 mL/min (ref >60)
Glucose, Bld: 189 mg/dL — ABNORMAL HIGH (ref 70–99)
Potassium: 3.8 mmol/L (ref 3.5–5.1)
Sodium: 133 mmol/L — ABNORMAL LOW (ref 135–145)
Total Bilirubin: 1.2 mg/dL (ref 0.0–1.2)
Total Protein: 6 g/dL — ABNORMAL LOW (ref 6.5–8.1)

## 2023-09-05 LAB — PHOSPHORUS: Phosphorus: 3.1 mg/dL (ref 2.5–4.6)

## 2023-09-05 LAB — MAGNESIUM: Magnesium: 2 mg/dL (ref 1.7–2.4)

## 2023-09-05 NOTE — Plan of Care (Signed)

## 2023-09-05 NOTE — Plan of Care (Signed)
 Problem: Education: Goal: Knowledge of General Education information will improve Description: Including pain rating scale, medication(s)/side effects and non-pharmacologic comfort measures 09/05/2023 2035 by Lennie Rodgers BIRCH, RN Outcome: Progressing 09/05/2023 2035 by Lennie Rodgers BIRCH, RN Outcome: Progressing   Problem: Health Behavior/Discharge Planning: Goal: Ability to manage health-related needs will improve 09/05/2023 2035 by Lennie Rodgers BIRCH, RN Outcome: Progressing 09/05/2023 2035 by Lennie Rodgers BIRCH, RN Outcome: Progressing   Problem: Clinical Measurements: Goal: Ability to maintain clinical measurements within normal limits will improve 09/05/2023 2035 by Lennie Rodgers BIRCH, RN Outcome: Progressing 09/05/2023 2035 by Lennie Rodgers BIRCH, RN Outcome: Progressing Goal: Will remain free from infection 09/05/2023 2035 by Lennie Rodgers BIRCH, RN Outcome: Progressing 09/05/2023 2035 by Lennie Rodgers BIRCH, RN Outcome: Progressing Goal: Diagnostic test results will improve 09/05/2023 2035 by Lennie Rodgers BIRCH, RN Outcome: Progressing 09/05/2023 2035 by Lennie Rodgers BIRCH, RN Outcome: Progressing Goal: Respiratory complications will improve 09/05/2023 2035 by Lennie Rodgers BIRCH, RN Outcome: Progressing 09/05/2023 2035 by Lennie Rodgers BIRCH, RN Outcome: Progressing Goal: Cardiovascular complication will be avoided 09/05/2023 2035 by Lennie Rodgers BIRCH, RN Outcome: Progressing 09/05/2023 2035 by Lennie Rodgers BIRCH, RN Outcome: Progressing   Problem: Activity: Goal: Risk for activity intolerance will decrease 09/05/2023 2035 by Lennie Rodgers BIRCH, RN Outcome: Progressing 09/05/2023 2035 by Lennie Rodgers BIRCH, RN Outcome: Progressing   Problem: Nutrition: Goal: Adequate nutrition will be maintained 09/05/2023 2035 by Lennie Rodgers BIRCH, RN Outcome: Progressing 09/05/2023 2035 by Lennie Rodgers BIRCH, RN Outcome: Progressing   Problem: Coping: Goal: Level of anxiety will decrease 09/05/2023 2035 by Lennie Rodgers BIRCH, RN Outcome: Progressing 09/05/2023 2035 by Lennie Rodgers BIRCH, RN Outcome: Progressing   Problem: Elimination: Goal: Will not experience complications related to bowel motility 09/05/2023 2035 by Lennie Rodgers BIRCH, RN Outcome: Progressing 09/05/2023 2035 by Lennie Rodgers BIRCH, RN Outcome: Progressing Goal: Will not experience complications related to urinary retention 09/05/2023 2035 by Lennie Rodgers BIRCH, RN Outcome: Progressing 09/05/2023 2035 by Lennie Rodgers BIRCH, RN Outcome: Progressing   Problem: Pain Managment: Goal: General experience of comfort will improve and/or be controlled 09/05/2023 2035 by Lennie Rodgers BIRCH, RN Outcome: Progressing 09/05/2023 2035 by Lennie Rodgers BIRCH, RN Outcome: Progressing   Problem: Safety: Goal: Ability to remain free from injury will improve 09/05/2023 2035 by Lennie Rodgers BIRCH, RN Outcome: Progressing 09/05/2023 2035 by Lennie Rodgers BIRCH, RN Outcome: Progressing   Problem: Skin Integrity: Goal: Risk for impaired skin integrity will decrease 09/05/2023 2035 by Lennie Rodgers BIRCH, RN Outcome: Progressing 09/05/2023 2035 by Lennie Rodgers BIRCH, RN Outcome: Progressing   Problem: Education: Goal: Verbalization of understanding the information provided (i.e., activity precautions, restrictions, etc) will improve 09/05/2023 2035 by Lennie Rodgers BIRCH, RN Outcome: Progressing 09/05/2023 2035 by Lennie Rodgers BIRCH, RN Outcome: Progressing Goal: Individualized Educational Video(s) 09/05/2023 2035 by Lennie Rodgers BIRCH, RN Outcome: Progressing 09/05/2023 2035 by Lennie Rodgers BIRCH, RN Outcome: Progressing   Problem: Activity: Goal: Ability to ambulate and perform ADLs will improve 09/05/2023 2035 by Lennie Rodgers BIRCH, RN Outcome: Progressing 09/05/2023 2035 by Lennie Rodgers BIRCH, RN Outcome: Progressing   Problem: Clinical Measurements: Goal: Postoperative complications will be avoided or minimized 09/05/2023 2035 by Lennie Rodgers BIRCH, RN Outcome:  Progressing 09/05/2023 2035 by Lennie Rodgers BIRCH, RN Outcome: Progressing   Problem: Self-Concept: Goal: Ability to maintain and perform role responsibilities to the fullest extent possible will improve 09/05/2023 2035 by Lennie Rodgers BIRCH, RN Outcome: Progressing 09/05/2023 2035 by Lennie Rodgers BIRCH, RN Outcome: Progressing   Problem:  Pain Management: Goal: Pain level will decrease 09/05/2023 2035 by Lennie Rodgers BIRCH, RN Outcome: Progressing 09/05/2023 2035 by Lennie Rodgers BIRCH, RN Outcome: Progressing

## 2023-09-05 NOTE — Progress Notes (Signed)
 Physical Therapy Treatment Patient Details Name: Jenna Franklin MRN: [id number] DOB: 1940-04-07 Today's Date: 09/05/2023   History of Present Illness Ms. Synethia Endicott is an 83 year old female with history of liver cirrhosis with splenomegaly and esophageal varices, anxiety, GERD, who presents emergency department for chief concerns of fall with resulting right hip pain. S/p R IM nailing 08/26/23.    PT Comments  83 yo Female was agreeable to working with PT. She continues to have limited ROM due to pain and requires extra time/effort and verbal cues for increased ROM. During exercise, patient expressed need to have BM. She requested bed pan. PT notified RN and assisted RN in positioning bedpan. Patient had already been incontinent but was not finished. RN stated she was given medication earlier to encourage BM. Therefore PT session ended early to give patient opportunity to finish using bathroom. She would benefit from additional skilled PT intervention to improve strength and mobility.    If plan is discharge home, recommend the following: Two people to help with walking and/or transfers;Two people to help with bathing/dressing/bathroom;Assistance with cooking/housework;Assist for transportation;Supervision due to cognitive status;Help with stairs or ramp for entrance   Can travel by private vehicle     No  Equipment Recommendations  Other (comment)    Recommendations for Other Services       Precautions / Restrictions Precautions Precautions: Fall Recall of Precautions/Restrictions: Impaired Precaution/Restrictions Comments: Poor safety awareness Restrictions Weight Bearing Restrictions Per Provider Order: Yes RLE Weight Bearing Per Provider Order: Weight bearing as tolerated     Mobility  Bed Mobility   Bed Mobility: Rolling Rolling: Mod assist         General bed mobility comments: During exercise, patient expressed desire to use bedpan for BM; RN came in and assisted  PT in positioning bedpan. Patient required mod A +1 for rolling to left, being hesitant due to pain in right hip;    Transfers                        Ambulation/Gait                   Stairs             Wheelchair Mobility     Tilt Bed    Modified Rankin (Stroke Patients Only)       Balance                                            Communication Communication Communication: No apparent difficulties  Cognition Arousal: Alert Behavior During Therapy: Restless, Anxious   PT - Cognitive impairments: History of cognitive impairments                       PT - Cognition Comments: Constant redirection, poor insight Following commands: Impaired Following commands impaired: Follows one step commands inconsistently, Follows one step commands with increased time    Cueing Cueing Techniques: Verbal cues, Tactile cues, Gestural cues, Visual cues  Exercises General Exercises - Lower Extremity Ankle Circles/Pumps: AROM, 10 reps Short Arc Quad: Strengthening, 10 reps, Supine Heel Slides:  (started heel slides, LLE and patient started having BM, exercise stopped and patient positioned on bedpan) Other Exercises Other Exercises: Patient educated in supine BLE strengthening/ROM exercise. During heel slides, patient reports needing to have BM. RN notified and  brought bed pan; PT assisted RN in positioning patient on bedpan. RN stated she was given miralax  earlier today; session ended due to patient needs.    General Comments        Pertinent Vitals/Pain Pain Assessment Pain Assessment: No/denies pain    Home Living                          Prior Function            PT Goals (current goals can now be found in the care plan section) Acute Rehab PT Goals Patient Stated Goal: to improve R hip pain PT Goal Formulation: With patient Time For Goal Achievement: 09/10/23 Potential to Achieve Goals: Fair Progress  towards PT goals: Progressing toward goals (slow progress)    Frequency    Min 4X/week      PT Plan      Co-evaluation              AM-PAC PT 6 Clicks Mobility   Outcome Measure  Help needed turning from your back to your side while in a flat bed without using bedrails?: A Lot Help needed moving from lying on your back to sitting on the side of a flat bed without using bedrails?: Total Help needed moving to and from a bed to a chair (including a wheelchair)?: Total Help needed standing up from a chair using your arms (e.g., wheelchair or bedside chair)?: Total Help needed to walk in hospital room?: Total Help needed climbing 3-5 steps with a railing? : Total 6 Click Score: 7    End of Session   Activity Tolerance: Patient limited by pain;Other (comment) (session ended early due to patient needing bedpan) Patient left: with bed alarm set;with call bell/phone within reach;with nursing/sitter in room Nurse Communication: Mobility status (need for bedpan) PT Visit Diagnosis: Muscle weakness (generalized) (M62.81);Difficulty in walking, not elsewhere classified (R26.2);Pain Pain - Right/Left: Right Pain - part of body: Hip     Time: 1150-1207 PT Time Calculation (min) (ACUTE ONLY): 17 min  Charges:    $Therapeutic Exercise: 8-22 mins PT General Charges $$ ACUTE PT VISIT: 1 Visit                     Sloan Galentine PT, DPT 09/05/2023, 2:46 PM

## 2023-09-05 NOTE — Progress Notes (Signed)
 Palliative Care Progress Note, Assessment & Plan   Patient Name: Jenna Franklin       Date: 09/05/2023 DOB: [birth year]  Age: 83 y.o. MRN#: [id number] Attending Physician: Jens Durand, MD Primary Care Physician: Justus Leita DEL, MD Admit Date: 08/26/2023  Subjective: Feels a little better today.  Complains of mild lower abdominal pain.  Denies chest pain or shortness of breath.  Reports appetite comes and goes.  Denies nausea.  HPI: Per previous HPI: 83 y.o. female  with past medical history of liver cirrhosis with splenomegaly and esophageal varices, anxiety and GERD admitted from home on 08/26/2023 with fall resulting in right hip pain.  Found to have right hip fracture, underwent orthopedic repair 7/10.   Imaging obtained in the ED concerning for colonic mass with suspicion of malignancy to the proximal transverse colon with omental implants-small pulmonary nodules also noted with concern for metastatic disease Acute PE also noted-course further complicated by acute GI bleed   Palliative medicine was consulted for assisting with goals of care conversations.  Summary of counseling/coordination of care:  Elderly, ill-appearing female sitting upright in bed with breakfast tray.  She is alert to self and location.  She correctly states Trump is president.  She is unable to correctly state year, year of birth or city she is currently in.  Unable to participate in meaningful conversation due to intermittent confusion.  She has eaten approximately 10% of breakfast stating she is not hungry.  Even, unlabored respirations.  She is in no distress.  Extensive chart review completed prior to meeting patient including labs, vital signs, imaging, progress notes, orders, and available advanced directive documents  from current and previous encounters.   After reviewing the patient's chart and assessing the patient at bedside, I spoke with patient in regards to symptom management and goals of care.   Advised patient I would return later to speak with family.  Therapeutic silence and active listening provided for patient to share her thoughts and emotions regarding current medical situation.  Emotional support provided.  Physical Exam Vitals reviewed.  Constitutional:      General: [name].    Appearance: She is ill-appearing.     Comments: Frail  HENT:     Head: Normocephalic and atraumatic.     Mouth/Throat:     Mouth: Mucous membranes are moist.  Pulmonary:     Effort: Pulmonary effort is normal. No respiratory distress.  Abdominal:     General: There is no distension.     Palpations: Abdomen is soft.     Tenderness: There is no abdominal tenderness. There is no guarding.  Musculoskeletal:     Right lower leg: No edema.     Left lower leg: No edema.  Skin:    General: Skin is warm and dry.  Neurological:     Mental Status: She is alert. She is disoriented.     Motor: Weakness present.    Recommendations/Plan: FULL CODE status as previously documented    Continue current supportive interventions Disposition TBD  Total Time 65 minutes   Time spent includes: Detailed review of medical records (labs, imaging, vital signs), medically appropriate exam (mental status, respiratory,  cardiac, skin), discussed with treatment team, counseling and educating patient, family and staff, documenting clinical information, medication management and coordination of care.  1515: Called and spoke with [name], daughter to see if she is still planning on visiting her mother today to further discuss goals of care.  [name] states she misunderstood PMT provider yesterday and was not planning to visit today.  She advises she will visit tomorrow. Questioned since her mother is confused, who  would act as Diplomatic Services operational officer.  [name] shares that her and her brother Chyrl make decisions for her mother together. [name] shares that at this time she wants to continue to treat the treatable including CPR and ventilator if needed.  She wants to allow times for outcomes to see if her mother will be able to make her own decisions regarding medical care.  [name] shares she wants her mother to go to facility for rehabilitation if necessary.   Plan to meet at bedside tomorrow to further discuss goals of care.   [name] Sacks, [facility] Palliative Medicine Team  09/05/2023 11:18 AM  Office [phone]  Pager [phone]

## 2023-09-05 NOTE — Progress Notes (Signed)
 Progress Note    Jenna Franklin  FMW:[id number] DOB: [birth year]  DOA: 08/26/2023 PCP: Justus Leita DEL, MD      Brief Narrative:    Medical records reviewed and are as summarized below:   Hospital course / significant events:   HPI: [name] is an 83 year old female with history of liver cirrhosis with splenomegaly and esophageal varices, anxiety, GERD, who presents emergency department for chief concerns of fall with resulting right hip pain.  07/10: admitted to hospitalist service. Underwent R hip fracture repair 07/11: PT/OT recs for SNF.  Discussion today with family regarding colon mass and options re further w/u  07/12: stable, await SNF thru the weekend 07/13: pt remains confused. Tachycardic into this afternoon worsening, dx new PE started on heparin   07/14: remains on heparin . Echo no RV strain. Ascites appears a bit worse today. Oncology consult - Dr Babara and I spoke w/ daughter [name], considering defer aggressive workup/treatment and focus on palliative/hospice, [name] will discuss further w/ family. At this time pt encephalopathic d/t hyperammonemia +/- underlying MCI/dementia and unable to participate in decisions.  07/15: ammonia normalized, pt still confused. Intermittently tachycardic, remains on heparin . Family seems to have definitively decided on NO biopsy as this will not really change course of care. Palliative consult ordered - plan at this point to treat the treatable, pursue rehab to hopefully at least get patients stronger / enhance QOL. Prognosis difficult - no real utility to PET and no real way to determine how quickly patient might decline. Still confused but a bit better - family would like to wait until tomorrow when pt's sone is getting into town to tall her about the mass etc.          Assessment/Plan:   Principal Problem:   Right femoral fracture (HCC) Active Problems:   Anxiety   Acid reflux   Cirrhosis of liver with ascites  (HCC)   Esophageal varices in cirrhosis (HCC)   Colonic mass   Weight loss   Protein-calorie malnutrition, severe   Closed fracture of right hip (HCC)   Hepatic encephalopathy (HCC)   Acute pulmonary embolism (HCC)   Acute GI bleeding   Nutrition Problem: Severe Malnutrition Etiology: chronic illness (cirrhosis)  Signs/Symptoms: percent weight loss, moderate fat depletion, severe fat depletion, moderate muscle depletion, severe muscle depletion   Body mass index is 22.06 kg/m.     Right femoral fracture Secondary to fall S/p Right Hip Intramedullary nail repair 08/26/2023 w/ Dr Lorelle Outpatient follow-up with [facility] clinic orthopedics 2 weeks postop Analgesics as needed. PT recommended discharge to SNF   Pulmonary Embolus No RV strain on echo Long-term anticoagulation contraindicated at this time because of GI bleeding. S/p IVC filter placement on 09/04/2023. No DVT in the lower extremities on venous duplex.   Acute GI bleeding .    Continue IV Protonix .  She is still having some red stools.  Patient having loose stools because of lactulose .   Colonic mass with infiltration into omentum, likely malignancy Pulmonary nodule, malignancy cannot be excluded Weight loss Given CT read of colonic mass with omental implants and small pulmonary nodules, high suspicion for malignancy and metastatic disease cannot be excluded at this time Pt's son and daughter ask to defer conversation w/ patient for now until more lucid, pt certainly lacks capacity at this time to participate in discussions and decision making  Dr Jinny GI spoke 07/11 with patient's daughter, holding off on colonoscopy for now and considering overall  frailty and other comorbidities may not be a good candidate for aggressive workup / treatment but would recommend further discussion with oncology team Oncology consult 07/14 option for biopsy mass to help dx/prognostication but overall pt is poor candidate for  treatments [name], daughter, would prefer that patient can go to SNF and have PET scan as an outpatient to confirm metastatic disease before making any further decisions regarding additional investigations and treatment.     Cirrhosis complicated by Esophageal varices Ascites, hepatic encephalopathy, thrombocytopenia Continue nadolol  Continue Lasix  and spironolactone .  [name], daughter, declined paracentesis.   Acute metabolic encephalopathy d/t acute illness / SIRS / hepatic encephalopathy/delirium Underlying cognitive impairment suspect possibly dementia Pt does not have capacity to understand her medical situation / make complex decisions at this time   Continue lactulose  Mental status is improving but not back to baseline Daughter and son are involved in her decisions, daughter has been the point of contact which son is in agreement with   For now we are not informing patient of mass/cancer, per family request   Acute on chronic anemia Chronic thrombocytopenia H&H stable: 7.6-9.4-9.0-8.9.  Monitor H&H. S/p transfusion with 1 unit of PRBCs on 09/02/2023.   Anxiety Ativan  as needed   Constipation, chronic  Continue laxatives as needed Also on lactulose    Urinary retention  Foley catheter has already been discontinued   Goals of care Treat what can be treated Patient remains full code for now.  Follow-up with palliative care team.   Patient's mental status is stable.  She may be discharged to SNF tomorrow if she remains stable and insurance authorization is approved. Plan of care was discussed with [name], daughter, over the phone (for about 25 minutes).     Diet Order             Diet regular Room service appropriate? Yes; Fluid consistency: Thin  Diet effective now                            Consultants: Magazine features editor Orthopedic surgeon Palliative care  Procedures: Right Hip Intramedullary nail repair 08/26/2023 w/ Dr  Lorelle    Medications:    Chlorhexidine  Gluconate Cloth  6 each Topical Daily   docusate sodium   100 mg Oral BID   feeding supplement  237 mL Oral BID BM   furosemide   20 mg Oral Daily   lactulose   20 g Oral BID   multivitamin with minerals  1 tablet Oral Daily   nadolol   20 mg Oral Daily   pantoprazole  (PROTONIX ) IV  40 mg Intravenous Q12H   polyethylene glycol  17 g Oral BID   spironolactone   25 mg Oral Daily   Continuous Infusions:     Anti-infectives (From admission, onward)    Start     Dose/Rate Route Frequency Ordered Stop   09/04/23 0000  ceFAZolin  (ANCEF ) IVPB 1 g/50 mL premix       Note to Pharmacy: Send with pt to OR   1 g 100 mL/hr over 30 Minutes Intravenous On call 09/03/23 1708 09/03/23 1900   08/26/23 2300  ceFAZolin  (ANCEF ) IVPB 2g/100 mL premix        2 g 200 mL/hr over 30 Minutes Intravenous Every 6 hours 08/26/23 2015 08/27/23 1059   08/26/23 1545  ceFAZolin  (ANCEF ) IVPB 2g/100 mL premix        2 g 200 mL/hr over 30 Minutes Intravenous  Once 08/26/23 1544 08/26/23 1628  Family Communication/Anticipated D/C date and plan/Code Status   DVT prophylaxis: SCDs Start: 08/26/23 2015 Place TED hose Start: 08/26/23 1551     Code Status: Full Code  Family Communication: None Disposition Plan: Plan to discharge to SNF   Status is: Inpatient Remains inpatient appropriate because: Acute pulmonary embolism       Subjective:   Interval events noted.  She has no complaints.  She feels better today.  No sitter at the bedside  Objective:    Vitals:   09/04/23 0732 09/04/23 1549 09/04/23 2122 09/05/23 0340  BP: (!) 109/55 117/62 131/70 126/61  Pulse: 85 81 91 79  Resp: 17 17 18 18   Temp: 97.6 F (36.4 C) 98 F (36.7 C) 98.7 F (37.1 C) 98.6 F (37 C)  TempSrc:      SpO2: 98% 98% 96% 98%  Weight:      Height:       No data found.  No intake or output data in the 24 hours ending 09/05/23 1441  Filed Weights    08/26/23 1143 09/03/23 1737  Weight: 56.5 kg 56.5 kg    Exam:  GEN: NAD SKIN: Warm and dry EYES: No pallor or icterus ENT: MMM CV: RRR PULM: CTA B ABD: soft, distended, NT, +BS CNS: AAO x 3, non focal EXT: No edema or tenderness      Data Reviewed:   I have personally reviewed following labs and imaging studies:  Labs: Labs show the following:   Basic Metabolic Panel: Recent Labs  Lab 08/31/23 1233 09/01/23 0514 09/02/23 0415 09/04/23 0501 09/05/23 1235  NA 136 137 138 137 133*  K 3.7 3.4* 3.9 3.6 3.8  CL 104 107 109 105 104  CO2 20* 24 23 24  20*  GLUCOSE 168* 181* 177* 168* 189*  BUN 12 14 14 18 15   CREATININE 0.41* 0.55 0.55 0.57 0.60  CALCIUM 8.2* 8.2* 8.3* 8.4* 8.4*  MG  --   --  2.0  --  2.0  PHOS  --   --  2.1*  --  3.1   GFR Estimated Creatinine Clearance: 44.1 mL/min (by C-G formula based on SCr of 0.6 mg/dL). Liver Function Tests: Recent Labs  Lab 08/31/23 1233 09/01/23 0514 09/02/23 0415 09/04/23 0501 09/05/23 1235  AST 48* 42* 39 47* 68*  ALT 16 18 21 25  33  ALKPHOS 79 83 79 88 93  BILITOT 1.1 1.1 1.2 1.5* 1.2  PROT 6.0* 5.3* 5.6* 5.9* 6.0*  ALBUMIN 2.6* 2.4* 2.6* 2.6* 2.5*   No results for input(s): LIPASE, AMYLASE in the last 168 hours. Recent Labs  Lab 08/30/23 1025 08/31/23 1302 09/01/23 0517  AMMONIA 86* 28 36*   Coagulation profile Recent Labs  Lab 08/29/23 2250  INR 1.3*    CBC: Recent Labs  Lab 08/31/23 0515 09/01/23 0514 09/02/23 0415 09/03/23 0417 09/04/23 0501 09/05/23 1235  WBC 2.7* 2.8* 2.5*  --  4.4 4.7  NEUTROABS  --   --   --   --   --  3.5  HGB 8.4* 8.2* 7.6* 9.4* 9.0* 8.9*  HCT 26.1* 25.5* 24.1* 29.1* 27.0* 27.7*  MCV 89.7 90.1 91.6  --  90.0 90.5  PLT 117* 105* 107*  --  120* 131*   Cardiac Enzymes: No results for input(s): CKTOTAL, CKMB, CKMBINDEX, TROPONINI in the last 168 hours.  BNP (last 3 results) No results for input(s): PROBNP in the last 8760 hours. CBG: No results  for input(s): GLUCAP in the last 168 hours.  D-Dimer: No results for input(s): DDIMER in the last 72 hours. Hgb A1c: No results for input(s): HGBA1C in the last 72 hours. Lipid Profile: No results for input(s): CHOL, HDL, LDLCALC, TRIG, CHOLHDL, LDLDIRECT in the last 72 hours. Thyroid  function studies: No results for input(s): TSH, T4TOTAL, T3FREE, THYROIDAB in the last 72 hours.  Invalid input(s): FREET3 Anemia work up: No results for input(s): VITAMINB12, FOLATE, FERRITIN, TIBC, IRON, RETICCTPCT in the last 72 hours. Sepsis Labs: Recent Labs  Lab 09/01/23 0514 09/02/23 0415 09/04/23 0501 09/05/23 1235  WBC 2.8* 2.5* 4.4 4.7    Microbiology No results found for this or any previous visit (from the past 240 hours).  Procedures and diagnostic studies:  US  Venous Img Lower Bilateral (DVT) Result Date: 09/04/2023 CLINICAL DATA:  Bilateral leg pain.  Known pulmonary emboli. EXAM: BILATERAL LOWER EXTREMITY VENOUS DOPPLER ULTRASOUND TECHNIQUE: Gray-scale sonography with graded compression, as well as color Doppler and duplex ultrasound were performed to evaluate the lower extremity deep venous systems from the level of the common femoral vein and including the common femoral, femoral, profunda femoral, popliteal and calf veins including the posterior tibial, peroneal and gastrocnemius veins when visible. The superficial great saphenous vein was also interrogated. Spectral Doppler was utilized to evaluate flow at rest and with distal augmentation maneuvers in the common femoral, femoral and popliteal veins. COMPARISON:  CTA chest 08/29/2023 FINDINGS: RIGHT LOWER EXTREMITY Common Femoral Vein: No evidence of thrombus. Normal compressibility, respiratory phasicity and response to augmentation. Saphenofemoral Junction: No evidence of thrombus. Normal compressibility and flow on color Doppler imaging. Profunda Femoral Vein: No evidence of thrombus. Normal  compressibility and flow on color Doppler imaging. Femoral Vein: No evidence of thrombus. Normal compressibility, respiratory phasicity and response to augmentation. Popliteal Vein: No evidence of thrombus. Normal compressibility, respiratory phasicity and response to augmentation. Calf Veins: No evidence of thrombus. Normal compressibility and flow on color Doppler imaging. Superficial Great Saphenous Vein: No evidence of thrombus. Normal compressibility. Venous Reflux:  None. Other Findings:  None. LEFT LOWER EXTREMITY Common Femoral Vein: No evidence of thrombus. Normal compressibility, respiratory phasicity and response to augmentation. Saphenofemoral Junction: No evidence of thrombus. Normal compressibility and flow on color Doppler imaging. Profunda Femoral Vein: No evidence of thrombus. Normal compressibility and flow on color Doppler imaging. Femoral Vein: No evidence of thrombus. Normal compressibility, respiratory phasicity and response to augmentation. Popliteal Vein: No evidence of thrombus. Normal compressibility, respiratory phasicity and response to augmentation. Calf Veins: No evidence of thrombus. Normal compressibility and flow on color Doppler imaging. Superficial Great Saphenous Vein: No evidence of thrombus. Normal compressibility. Venous Reflux:  None. Other Findings:  None. IMPRESSION: No evidence of deep venous thrombosis in either lower extremity. Electronically Signed   By: Ranell Bring M.D.   On: 09/04/2023 14:41   PERIPHERAL VASCULAR CATHETERIZATION Result Date: 09/03/2023 See surgical note for result.              LOS: 10 days   Murdock Jellison  [facility]   Pager on [email or web address]. If 7PM-7AM, please contact night-coverage at [email or web address]     09/05/2023, 2:41 PM

## 2023-09-06 ENCOUNTER — Encounter: Payer: Self-pay | Admitting: Vascular Surgery

## 2023-09-06 DIAGNOSIS — R634 Abnormal weight loss: Secondary | ICD-10-CM | POA: Diagnosis not present

## 2023-09-06 DIAGNOSIS — I452 Bifascicular block: Secondary | ICD-10-CM | POA: Diagnosis present

## 2023-09-06 DIAGNOSIS — K7682 Hepatic encephalopathy: Secondary | ICD-10-CM | POA: Diagnosis not present

## 2023-09-06 DIAGNOSIS — R58 Hemorrhage, not elsewhere classified: Secondary | ICD-10-CM | POA: Diagnosis not present

## 2023-09-06 DIAGNOSIS — M8580 Other specified disorders of bone density and structure, unspecified site: Secondary | ICD-10-CM | POA: Diagnosis present

## 2023-09-06 DIAGNOSIS — I5032 Chronic diastolic (congestive) heart failure: Secondary | ICD-10-CM | POA: Diagnosis present

## 2023-09-06 DIAGNOSIS — Z743 Need for continuous supervision: Secondary | ICD-10-CM | POA: Diagnosis not present

## 2023-09-06 DIAGNOSIS — S7291XD Unspecified fracture of right femur, subsequent encounter for closed fracture with routine healing: Secondary | ICD-10-CM | POA: Diagnosis not present

## 2023-09-06 DIAGNOSIS — I700 Atherosclerosis of aorta: Secondary | ICD-10-CM | POA: Diagnosis not present

## 2023-09-06 DIAGNOSIS — D630 Anemia in neoplastic disease: Secondary | ICD-10-CM | POA: Diagnosis present

## 2023-09-06 DIAGNOSIS — D696 Thrombocytopenia, unspecified: Secondary | ICD-10-CM | POA: Diagnosis not present

## 2023-09-06 DIAGNOSIS — R0989 Other specified symptoms and signs involving the circulatory and respiratory systems: Secondary | ICD-10-CM | POA: Diagnosis not present

## 2023-09-06 DIAGNOSIS — K760 Fatty (change of) liver, not elsewhere classified: Secondary | ICD-10-CM | POA: Diagnosis present

## 2023-09-06 DIAGNOSIS — Z961 Presence of intraocular lens: Secondary | ICD-10-CM | POA: Diagnosis present

## 2023-09-06 DIAGNOSIS — K6389 Other specified diseases of intestine: Secondary | ICD-10-CM | POA: Diagnosis present

## 2023-09-06 DIAGNOSIS — R911 Solitary pulmonary nodule: Secondary | ICD-10-CM | POA: Diagnosis not present

## 2023-09-06 DIAGNOSIS — L89322 Pressure ulcer of left buttock, stage 2: Secondary | ICD-10-CM | POA: Diagnosis present

## 2023-09-06 DIAGNOSIS — R404 Transient alteration of awareness: Secondary | ICD-10-CM | POA: Diagnosis not present

## 2023-09-06 DIAGNOSIS — F419 Anxiety disorder, unspecified: Secondary | ICD-10-CM | POA: Diagnosis not present

## 2023-09-06 DIAGNOSIS — Z95828 Presence of other vascular implants and grafts: Secondary | ICD-10-CM | POA: Diagnosis not present

## 2023-09-06 DIAGNOSIS — F0390 Unspecified dementia without behavioral disturbance: Secondary | ICD-10-CM | POA: Diagnosis not present

## 2023-09-06 DIAGNOSIS — Z789 Other specified health status: Secondary | ICD-10-CM

## 2023-09-06 DIAGNOSIS — K5730 Diverticulosis of large intestine without perforation or abscess without bleeding: Secondary | ICD-10-CM | POA: Diagnosis not present

## 2023-09-06 DIAGNOSIS — R188 Other ascites: Secondary | ICD-10-CM | POA: Diagnosis present

## 2023-09-06 DIAGNOSIS — D374 Neoplasm of uncertain behavior of colon: Secondary | ICD-10-CM | POA: Diagnosis not present

## 2023-09-06 DIAGNOSIS — Z66 Do not resuscitate: Secondary | ICD-10-CM | POA: Diagnosis present

## 2023-09-06 DIAGNOSIS — I8510 Secondary esophageal varices without bleeding: Secondary | ICD-10-CM | POA: Diagnosis not present

## 2023-09-06 DIAGNOSIS — I8500 Esophageal varices without bleeding: Secondary | ICD-10-CM | POA: Diagnosis not present

## 2023-09-06 DIAGNOSIS — E872 Acidosis, unspecified: Secondary | ICD-10-CM | POA: Diagnosis present

## 2023-09-06 DIAGNOSIS — I10 Essential (primary) hypertension: Secondary | ICD-10-CM | POA: Diagnosis not present

## 2023-09-06 DIAGNOSIS — E876 Hypokalemia: Secondary | ICD-10-CM | POA: Diagnosis present

## 2023-09-06 DIAGNOSIS — R739 Hyperglycemia, unspecified: Secondary | ICD-10-CM | POA: Diagnosis not present

## 2023-09-06 DIAGNOSIS — R000 Tachycardia, unspecified: Secondary | ICD-10-CM | POA: Diagnosis not present

## 2023-09-06 DIAGNOSIS — K219 Gastro-esophageal reflux disease without esophagitis: Secondary | ICD-10-CM | POA: Diagnosis not present

## 2023-09-06 DIAGNOSIS — R0602 Shortness of breath: Secondary | ICD-10-CM | POA: Diagnosis not present

## 2023-09-06 DIAGNOSIS — K5909 Other constipation: Secondary | ICD-10-CM | POA: Diagnosis not present

## 2023-09-06 DIAGNOSIS — R4182 Altered mental status, unspecified: Secondary | ICD-10-CM | POA: Diagnosis not present

## 2023-09-06 DIAGNOSIS — R7989 Other specified abnormal findings of blood chemistry: Secondary | ICD-10-CM | POA: Diagnosis not present

## 2023-09-06 DIAGNOSIS — C786 Secondary malignant neoplasm of retroperitoneum and peritoneum: Secondary | ICD-10-CM | POA: Diagnosis present

## 2023-09-06 DIAGNOSIS — E43 Unspecified severe protein-calorie malnutrition: Secondary | ICD-10-CM | POA: Diagnosis not present

## 2023-09-06 DIAGNOSIS — S72001A Fracture of unspecified part of neck of right femur, initial encounter for closed fracture: Secondary | ICD-10-CM | POA: Diagnosis not present

## 2023-09-06 DIAGNOSIS — Z515 Encounter for palliative care: Secondary | ICD-10-CM | POA: Diagnosis not present

## 2023-09-06 DIAGNOSIS — I2699 Other pulmonary embolism without acute cor pulmonale: Secondary | ICD-10-CM | POA: Diagnosis present

## 2023-09-06 DIAGNOSIS — Z7189 Other specified counseling: Secondary | ICD-10-CM | POA: Diagnosis not present

## 2023-09-06 DIAGNOSIS — R7303 Prediabetes: Secondary | ICD-10-CM | POA: Diagnosis not present

## 2023-09-06 DIAGNOSIS — Z8249 Family history of ischemic heart disease and other diseases of the circulatory system: Secondary | ICD-10-CM | POA: Diagnosis not present

## 2023-09-06 DIAGNOSIS — K7460 Unspecified cirrhosis of liver: Secondary | ICD-10-CM | POA: Diagnosis not present

## 2023-09-06 DIAGNOSIS — R9389 Abnormal findings on diagnostic imaging of other specified body structures: Secondary | ICD-10-CM | POA: Diagnosis not present

## 2023-09-06 DIAGNOSIS — S7291XA Unspecified fracture of right femur, initial encounter for closed fracture: Secondary | ICD-10-CM | POA: Diagnosis not present

## 2023-09-06 DIAGNOSIS — Z9181 History of falling: Secondary | ICD-10-CM | POA: Diagnosis not present

## 2023-09-06 DIAGNOSIS — M25551 Pain in right hip: Secondary | ICD-10-CM | POA: Diagnosis not present

## 2023-09-06 DIAGNOSIS — Z803 Family history of malignant neoplasm of breast: Secondary | ICD-10-CM | POA: Diagnosis not present

## 2023-09-06 DIAGNOSIS — Z8719 Personal history of other diseases of the digestive system: Secondary | ICD-10-CM | POA: Diagnosis not present

## 2023-09-06 DIAGNOSIS — C7800 Secondary malignant neoplasm of unspecified lung: Secondary | ICD-10-CM | POA: Diagnosis not present

## 2023-09-06 DIAGNOSIS — D649 Anemia, unspecified: Secondary | ICD-10-CM | POA: Diagnosis not present

## 2023-09-06 DIAGNOSIS — G9389 Other specified disorders of brain: Secondary | ICD-10-CM | POA: Diagnosis not present

## 2023-09-06 DIAGNOSIS — W19XXXA Unspecified fall, initial encounter: Secondary | ICD-10-CM | POA: Diagnosis not present

## 2023-09-06 DIAGNOSIS — K7031 Alcoholic cirrhosis of liver with ascites: Secondary | ICD-10-CM | POA: Diagnosis not present

## 2023-09-06 DIAGNOSIS — I110 Hypertensive heart disease with heart failure: Secondary | ICD-10-CM | POA: Diagnosis present

## 2023-09-06 DIAGNOSIS — W19XXXD Unspecified fall, subsequent encounter: Secondary | ICD-10-CM | POA: Diagnosis not present

## 2023-09-06 DIAGNOSIS — C189 Malignant neoplasm of colon, unspecified: Secondary | ICD-10-CM | POA: Diagnosis present

## 2023-09-06 DIAGNOSIS — K922 Gastrointestinal hemorrhage, unspecified: Secondary | ICD-10-CM | POA: Diagnosis not present

## 2023-09-06 DIAGNOSIS — Z90710 Acquired absence of both cervix and uterus: Secondary | ICD-10-CM | POA: Diagnosis not present

## 2023-09-06 DIAGNOSIS — Y92009 Unspecified place in unspecified non-institutional (private) residence as the place of occurrence of the external cause: Secondary | ICD-10-CM | POA: Diagnosis not present

## 2023-09-06 DIAGNOSIS — G3184 Mild cognitive impairment, so stated: Secondary | ICD-10-CM | POA: Diagnosis not present

## 2023-09-06 DIAGNOSIS — R600 Localized edema: Secondary | ICD-10-CM | POA: Diagnosis not present

## 2023-09-06 DIAGNOSIS — G9341 Metabolic encephalopathy: Secondary | ICD-10-CM | POA: Diagnosis present

## 2023-09-06 DIAGNOSIS — R161 Splenomegaly, not elsewhere classified: Secondary | ICD-10-CM | POA: Diagnosis not present

## 2023-09-06 DIAGNOSIS — R410 Disorientation, unspecified: Secondary | ICD-10-CM | POA: Diagnosis not present

## 2023-09-06 DIAGNOSIS — L89312 Pressure ulcer of right buttock, stage 2: Secondary | ICD-10-CM | POA: Diagnosis present

## 2023-09-06 DIAGNOSIS — U071 COVID-19: Secondary | ICD-10-CM | POA: Diagnosis present

## 2023-09-06 MED ORDER — FUROSEMIDE 20 MG PO TABS: 20.0000 mg | ORAL_TABLET | Freq: Every day | ORAL | Status: DC

## 2023-09-06 MED ORDER — SPIRONOLACTONE 25 MG PO TABS: 25.0000 mg | ORAL_TABLET | Freq: Every day | ORAL | Status: DC

## 2023-09-06 MED ORDER — LACTULOSE 10 GM/15ML PO SOLN: 20.0000 g | Freq: Two times a day (BID) | ORAL | Status: DC

## 2023-09-06 NOTE — Progress Notes (Addendum)
 Progress Note    Jenna Franklin  FMW:[id number] DOB: 15-Mar-1940  DOA: 08/26/2023 PCP: Justus Leita DEL, MD      Brief Narrative:    Medical records reviewed and are as summarized below:   Hospital course / significant events:   HPI: [name] is an 83 year old female with history of liver cirrhosis with splenomegaly and esophageal varices, anxiety, GERD, who presents emergency department for chief concerns of fall with resulting right hip pain.  07/10: admitted to hospitalist service. Underwent R hip fracture repair 07/11: PT/OT recs for SNF.  Discussion today with family regarding colon mass and options re further w/u  07/12: stable, await SNF thru the weekend 07/13: pt remains confused. Tachycardic into this afternoon worsening, dx new PE started on heparin   07/14: remains on heparin . Echo no RV strain. Ascites appears a bit worse today. Oncology consult - Dr Babara and I spoke w/ daughter [name], considering defer aggressive workup/treatment and focus on palliative/hospice, [name] will discuss further w/ family. At this time pt encephalopathic d/t hyperammonemia +/- underlying MCI/dementia and unable to participate in decisions.  07/15: ammonia normalized, pt still confused. Intermittently tachycardic, remains on heparin . Family seems to have definitively decided on NO biopsy as this will not really change course of care. Palliative consult ordered - plan at this point to treat the treatable, pursue rehab to hopefully at least get patients stronger / enhance QOL. Prognosis difficult - no real utility to PET and no real way to determine how quickly patient might decline. Still confused but a bit better - family would like to wait until tomorrow when pt's sone is getting into town to tall her about the mass etc.          Assessment/Plan:   Principal Problem:   Right femoral fracture (HCC) Active Problems:   Anxiety   Acid reflux   Cirrhosis of liver with ascites  (HCC)   Esophageal varices in cirrhosis (HCC)   Colonic mass   Weight loss   Protein-calorie malnutrition, severe   Closed fracture of right hip (HCC)   Hepatic encephalopathy (HCC)   Acute pulmonary embolism (HCC)   Acute GI bleeding   Nutrition Problem: Severe Malnutrition Etiology: chronic illness (cirrhosis)  Signs/Symptoms: percent weight loss, moderate fat depletion, severe fat depletion, moderate muscle depletion, severe muscle depletion   Body mass index is 22.06 kg/m.     Right femoral fracture Secondary to fall S/p Right Hip Intramedullary nail repair 08/26/2023 w/ Dr Lorelle Outpatient follow-up with [facility] clinic orthopedics 2 weeks postop Analgesics as needed. PT recommended discharge to SNF   Pulmonary Embolus No RV strain on echo Long-term anticoagulation contraindicated at this time because of GI bleeding. S/p IVC filter placement on 09/04/2023. No DVT in the lower extremities on venous duplex. SCDs have been ordered for DVT prevention   Acute GI bleeding .    Continue IV Protonix .  She is still having some red stools.  Patient having loose stools because of lactulose .   Colonic mass with infiltration into omentum, likely malignancy Pulmonary nodule, malignancy cannot be excluded Weight loss Given CT read of colonic mass with omental implants and small pulmonary nodules, high suspicion for malignancy and metastatic disease cannot be excluded at this time Patient's son and daughter wanted to defer discussion of colonic mass and possible malignancy with patient at this time Dr Jinny GI spoke 07/11 with patient's daughter, holding off on colonoscopy for now and considering overall frailty and other comorbidities  may not be a good candidate for aggressive workup / treatment but would recommend further discussion with oncology team Oncology consult 07/14 option for biopsy mass to help dx/prognostication but overall pt is poor candidate for  treatments [name], daughter, would prefer that patient can go to SNF and have PET scan as an outpatient to confirm metastatic disease before making any further decisions regarding additional investigations and treatment.     Cirrhosis complicated by Esophageal varices Ascites, hepatic encephalopathy, thrombocytopenia Continue nadolol  Continue Lasix  and spironolactone .  [name], daughter, declined paracentesis.   Acute metabolic encephalopathy d/t acute illness / SIRS / hepatic encephalopathy/delirium Underlying cognitive impairment suspect possibly dementia Pt does not have capacity to understand her medical situation / make complex decisions at this time   Continue lactulose  Mental status has improved and appears close to baseline. Daughter and son are involved in her decisions, daughter has been the point of contact which son is in agreement with   For now we are not informing patient of mass/cancer, per family request   Acute on chronic anemia Chronic thrombocytopenia H&H stable: 7.6-9.4-9.0-8.9.  Monitor H&H. S/p transfusion with 1 unit of PRBCs on 09/02/2023.   Anxiety Ativan  as needed   Constipation, chronic  Discontinue MiraLAX  and Colace Continue lactulose    Urinary retention  Foley catheter has already been discontinued   Goals of care Treat what can be treated Patient remains full code for now.  Follow-up with palliative care team.      Diet Order             Diet regular Room service appropriate? Yes; Fluid consistency: Thin  Diet effective now                            Consultants: TEFL teacher Palliative care Vascular surgeon  Procedures: Right Hip Intramedullary nail repair 08/26/2023 w/ Dr Lorelle IVC filter placement on 09/03/2023    Medications:    Chlorhexidine  Gluconate Cloth  6 each Topical Daily   feeding supplement  237 mL Oral BID BM   furosemide   20 mg Oral Daily   lactulose    20 g Oral BID   multivitamin with minerals  1 tablet Oral Daily   nadolol   20 mg Oral Daily   pantoprazole  (PROTONIX ) IV  40 mg Intravenous Q12H   spironolactone   25 mg Oral Daily   Continuous Infusions:     Anti-infectives (From admission, onward)    Start     Dose/Rate Route Frequency Ordered Stop   09/04/23 0000  ceFAZolin  (ANCEF ) IVPB 1 g/50 mL premix       Note to Pharmacy: Send with pt to OR   1 g 100 mL/hr over 30 Minutes Intravenous On call 09/03/23 1708 09/03/23 1900   08/26/23 2300  ceFAZolin  (ANCEF ) IVPB 2g/100 mL premix        2 g 200 mL/hr over 30 Minutes Intravenous Every 6 hours 08/26/23 2015 08/27/23 1059   08/26/23 1545  ceFAZolin  (ANCEF ) IVPB 2g/100 mL premix        2 g 200 mL/hr over 30 Minutes Intravenous  Once 08/26/23 1544 08/26/23 1628              Family Communication/Anticipated D/C date and plan/Code Status   DVT prophylaxis: Place and maintain sequential compression device Start: 09/06/23 1138 SCDs Start: 08/26/23 2015 Place TED hose Start: 08/26/23 1551     Code Status: Full Code  Family Communication: None Disposition  Plan: Plan to discharge to SNF   Status is: Inpatient Remains inpatient appropriate because: Awaiting placement to SNF       Subjective:   Interval events noted.  She is having loose watery stools.  2 nurse techs were at the bedside.  Objective:    Vitals:   09/05/23 0340 09/05/23 1613 09/05/23 1931 09/06/23 0718  BP: 126/61 115/60 127/63 (!) 115/55  Pulse: 79 87 93 90  Resp: 18 16 18 16   Temp: 98.6 F (37 C) (!) 97.5 F (36.4 C) 98.7 F (37.1 C) (!) 97.5 F (36.4 C)  TempSrc:   Oral Oral  SpO2: 98% 98% 97% 98%  Weight:      Height:       No data found.   Intake/Output Summary (Last 24 hours) at 09/06/2023 1138 Last data filed at 09/06/2023 1048 Gross per 24 hour  Intake --  Output 800 ml  Net -800 ml    Filed Weights   08/26/23 1143 09/03/23 1737  Weight: 56.5 kg 56.5 kg     Exam:  GEN: NAD SKIN: Warm and dry EYES: No pallor or icterus ENT: MMM CV: RRR PULM: CTA B ABD: soft, distended, NT, +BS CNS: AAO x 3, non focal EXT: Trace b/l ankle edema.  No erythema or tenderness      Data Reviewed:   I have personally reviewed following labs and imaging studies:  Labs: Labs show the following:   Basic Metabolic Panel: Recent Labs  Lab 08/31/23 1233 09/01/23 0514 09/02/23 0415 09/04/23 0501 09/05/23 1235  NA 136 137 138 137 133*  K 3.7 3.4* 3.9 3.6 3.8  CL 104 107 109 105 104  CO2 20* 24 23 24  20*  GLUCOSE 168* 181* 177* 168* 189*  BUN 12 14 14 18 15   CREATININE 0.41* 0.55 0.55 0.57 0.60  CALCIUM 8.2* 8.2* 8.3* 8.4* 8.4*  MG  --   --  2.0  --  2.0  PHOS  --   --  2.1*  --  3.1   GFR Estimated Creatinine Clearance: 44.1 mL/min (by C-G formula based on SCr of 0.6 mg/dL). Liver Function Tests: Recent Labs  Lab 08/31/23 1233 09/01/23 0514 09/02/23 0415 09/04/23 0501 09/05/23 1235  AST 48* 42* 39 47* 68*  ALT 16 18 21 25  33  ALKPHOS 79 83 79 88 93  BILITOT 1.1 1.1 1.2 1.5* 1.2  PROT 6.0* 5.3* 5.6* 5.9* 6.0*  ALBUMIN 2.6* 2.4* 2.6* 2.6* 2.5*   No results for input(s): LIPASE, AMYLASE in the last 168 hours. Recent Labs  Lab 08/31/23 1302 09/01/23 0517  AMMONIA 28 36*   Coagulation profile No results for input(s): INR, PROTIME in the last 168 hours.   CBC: Recent Labs  Lab 08/31/23 0515 09/01/23 0514 09/02/23 0415 09/03/23 0417 09/04/23 0501 09/05/23 1235  WBC 2.7* 2.8* 2.5*  --  4.4 4.7  NEUTROABS  --   --   --   --   --  3.5  HGB 8.4* 8.2* 7.6* 9.4* 9.0* 8.9*  HCT 26.1* 25.5* 24.1* 29.1* 27.0* 27.7*  MCV 89.7 90.1 91.6  --  90.0 90.5  PLT 117* 105* 107*  --  120* 131*   Cardiac Enzymes: No results for input(s): CKTOTAL, CKMB, CKMBINDEX, TROPONINI in the last 168 hours.  BNP (last 3 results) No results for input(s): PROBNP in the last 8760 hours. CBG: No results for input(s): GLUCAP in  the last 168 hours. D-Dimer: No results for input(s): DDIMER in the last  72 hours. Hgb A1c: No results for input(s): HGBA1C in the last 72 hours. Lipid Profile: No results for input(s): CHOL, HDL, LDLCALC, TRIG, CHOLHDL, LDLDIRECT in the last 72 hours. Thyroid  function studies: No results for input(s): TSH, T4TOTAL, T3FREE, THYROIDAB in the last 72 hours.  Invalid input(s): FREET3 Anemia work up: No results for input(s): VITAMINB12, FOLATE, FERRITIN, TIBC, IRON, RETICCTPCT in the last 72 hours. Sepsis Labs: Recent Labs  Lab 09/01/23 0514 09/02/23 0415 09/04/23 0501 09/05/23 1235  WBC 2.8* 2.5* 4.4 4.7    Microbiology No results found for this or any previous visit (from the past 240 hours).  Procedures and diagnostic studies:  US  Venous Img Lower Bilateral (DVT) Result Date: 09/04/2023 CLINICAL DATA:  Bilateral leg pain.  Known pulmonary emboli. EXAM: BILATERAL LOWER EXTREMITY VENOUS DOPPLER ULTRASOUND TECHNIQUE: Gray-scale sonography with graded compression, as well as color Doppler and duplex ultrasound were performed to evaluate the lower extremity deep venous systems from the level of the common femoral vein and including the common femoral, femoral, profunda femoral, popliteal and calf veins including the posterior tibial, peroneal and gastrocnemius veins when visible. The superficial great saphenous vein was also interrogated. Spectral Doppler was utilized to evaluate flow at rest and with distal augmentation maneuvers in the common femoral, femoral and popliteal veins. COMPARISON:  CTA chest 08/29/2023 FINDINGS: RIGHT LOWER EXTREMITY Common Femoral Vein: No evidence of thrombus. Normal compressibility, respiratory phasicity and response to augmentation. Saphenofemoral Junction: No evidence of thrombus. Normal compressibility and flow on color Doppler imaging. Profunda Femoral Vein: No evidence of thrombus. Normal compressibility and flow on  color Doppler imaging. Femoral Vein: No evidence of thrombus. Normal compressibility, respiratory phasicity and response to augmentation. Popliteal Vein: No evidence of thrombus. Normal compressibility, respiratory phasicity and response to augmentation. Calf Veins: No evidence of thrombus. Normal compressibility and flow on color Doppler imaging. Superficial Great Saphenous Vein: No evidence of thrombus. Normal compressibility. Venous Reflux:  None. Other Findings:  None. LEFT LOWER EXTREMITY Common Femoral Vein: No evidence of thrombus. Normal compressibility, respiratory phasicity and response to augmentation. Saphenofemoral Junction: No evidence of thrombus. Normal compressibility and flow on color Doppler imaging. Profunda Femoral Vein: No evidence of thrombus. Normal compressibility and flow on color Doppler imaging. Femoral Vein: No evidence of thrombus. Normal compressibility, respiratory phasicity and response to augmentation. Popliteal Vein: No evidence of thrombus. Normal compressibility, respiratory phasicity and response to augmentation. Calf Veins: No evidence of thrombus. Normal compressibility and flow on color Doppler imaging. Superficial Great Saphenous Vein: No evidence of thrombus. Normal compressibility. Venous Reflux:  None. Other Findings:  None. IMPRESSION: No evidence of deep venous thrombosis in either lower extremity. Electronically Signed   By: Ranell Bring M.D.   On: 09/04/2023 14:41               LOS: 11 days   Nobuko Gsell  [facility]   Pager on [email or web address]. If 7PM-7AM, please contact night-coverage at [email or web address]     09/06/2023, 11:38 AM

## 2023-09-06 NOTE — NC FL2 (Signed)
 [place]  MEDICAID FL2 LEVEL OF CARE FORM     IDENTIFICATION  Patient Name: Jenna Franklin Birthdate: 06/25/1940 Sex: female Admission Date (Current Location): 08/26/2023  [facility] and [place] Number:  Chiropodist and Address:  [facility], [address] [place], [place] [address]      Provider Number: [id number]  Attending Physician Name and Address:  No att. providers found  Relative Name and Phone Number:  [name] Cones [phone]    Current Level of Care: Hospital Recommended Level of Care: Skilled Nursing Facility Prior Approval Number:    Date Approved/Denied: 09/06/23 PASRR Number: [id number]  Discharge Plan: SNF    Current Diagnoses: Patient Active Problem List   Diagnosis Date Noted   Acute GI bleeding 09/03/2023   Acute pulmonary embolism (HCC) 09/02/2023   Closed fracture of right hip (HCC) 08/30/2023   Hepatic encephalopathy (HCC) 08/30/2023   Protein-calorie malnutrition, severe 08/27/2023   Right femoral fracture (HCC) 08/26/2023   Colonic mass 08/26/2023   Weight loss 08/26/2023   TMJ arthralgia 05/26/2019   Lumbar spinal stenosis 07/27/2018   Thoracic compression fracture, sequela 04/01/2018   Esophageal varices in cirrhosis (HCC)    Cirrhosis of liver with ascites (HCC) 05/29/2015   Post-menopausal atrophic vaginitis 05/29/2015   Anxiety 03/25/2015   Acid reflux 03/25/2015   Pre-diabetes 09/20/2014   Bursitis, trochanteric 12/11/2011    Orientation RESPIRATION BLADDER Height & Weight     Self  Normal Incontinent Weight: 56.5 kg Height:  5' 3 (160 cm)  BEHAVIORAL SYMPTOMS/MOOD NEUROLOGICAL BOWEL NUTRITION STATUS      Incontinent Diet  AMBULATORY STATUS COMMUNICATION OF NEEDS Skin   Extensive Assist Verbally Skin abrasions                       Personal Care Assistance Level of Assistance  Bathing, Feeding, Dressing Bathing Assistance: Limited assistance Feeding assistance: Limited  assistance Dressing Assistance: Limited assistance     Functional Limitations Info  Sight, Hearing, Speech          SPECIAL CARE FACTORS FREQUENCY  PT (By licensed PT), OT (By licensed OT)     PT Frequency: 5x OT Frequency: 5x            Contractures      Additional Factors Info   (OUTPATIENT PALLIATIVE REFERRAL) Code Status Info: FULL Allergies Info: Codeine; Sulfa Antibiotics; Prednisone , Baclofen            Current Medications (09/06/2023):  This is the current hospital active medication list Current Facility-Administered Medications  Medication Dose Route Frequency Provider Last Rate Last Admin   acetaminophen  (TYLENOL ) tablet 325-650 mg  325-650 mg Oral Q6H PRN Schnier, Gregory G, MD   650 mg at 08/28/23 2155   bisacodyl  (DULCOLAX) suppository 10 mg  10 mg Rectal Daily PRN Schnier, Gregory G, MD       Chlorhexidine  Gluconate Cloth 2 % PADS 6 each  6 each Topical Daily Schnier, Gregory G, MD   6 each at 09/06/23 0951   feeding supplement (ENSURE PLUS HIGH PROTEIN) liquid 237 mL  237 mL Oral BID BM Schnier, Gregory G, MD   237 mL at 09/06/23 0950   furosemide  (LASIX ) tablet 20 mg  20 mg Oral Daily Ayiku, Bernard, MD   20 mg at 09/06/23 0950   HYDROcodone -acetaminophen  (NORCO/VICODIN) 5-325 MG per tablet 1 tablet  1 tablet Oral Q6H PRN Jama Gregory G, MD   1 tablet at 09/04/23 2131   labetalol  (  NORMODYNE ) injection 5 mg  5 mg Intravenous Q2H PRN Schnier, Gregory G, MD       lactulose  (CHRONULAC ) 10 GM/15ML solution 20 g  20 g Oral BID Schnier, Gregory G, MD   20 g at 09/06/23 9049   LORazepam  (ATIVAN ) injection 0.5 mg  0.5 mg Intravenous Q6H PRN Schnier, Gregory G, MD   0.5 mg at 08/31/23 2349   menthol -cetylpyridinium (CEPACOL) lozenge 3 mg  1 lozenge Oral PRN Schnier, Gregory G, MD       Or   phenol (CHLORASEPTIC) mouth spray 1 spray  1 spray Mouth/Throat PRN Schnier, Cordella MATSU, MD       metoCLOPramide  (REGLAN ) tablet 5-10 mg  5-10 mg Oral Q8H PRN Schnier, Gregory  G, MD       Or   metoCLOPramide  (REGLAN ) injection 5-10 mg  5-10 mg Intravenous Q8H PRN Schnier, Cordella MATSU, MD       morphine  (PF) 2 MG/ML injection 0.5-1 mg  0.5-1 mg Intravenous Q2H PRN Schnier, Gregory G, MD   1 mg at 08/29/23 2112   multivitamin with minerals tablet 1 tablet  1 tablet Oral Daily Schnier, Gregory G, MD   1 tablet at 09/06/23 9049   nadolol  (CORGARD ) tablet 20 mg  20 mg Oral Daily Schnier, Gregory G, MD   20 mg at 09/06/23 0950   ondansetron  (ZOFRAN ) tablet 4 mg  4 mg Oral Q6H PRN Schnier, Cordella MATSU, MD       Or   ondansetron  (ZOFRAN ) injection 4 mg  4 mg Intravenous Q6H PRN Schnier, Gregory G, MD       pantoprazole  (PROTONIX ) injection 40 mg  40 mg Intravenous Q12H Schnier, Gregory G, MD   40 mg at 09/06/23 0950   sodium chloride  flush (NS) 0.9 % injection 10-40 mL  10-40 mL Intracatheter PRN Schnier, Cordella MATSU, MD       spironolactone  (ALDACTONE ) tablet 25 mg  25 mg Oral Daily Schnier, Gregory G, MD   25 mg at 09/06/23 9049   Current Outpatient Medications  Medication Sig Dispense Refill   nadolol  (CORGARD ) 20 MG tablet TAKE (1) TABLET BY MOUTH EVERY DAY 90 tablet 3   furosemide  (LASIX ) 20 MG tablet Take 1 tablet (20 mg total) by mouth daily. TAKE ONE (1) TABLET BY MOUTH ONCE DAILY     HYDROcodone -acetaminophen  (NORCO/VICODIN) 5-325 MG tablet Take 1 tablet by mouth every 6 (six) hours as needed for severe pain (pain score 7-10) or moderate pain (pain score 4-6). 30 tablet 0   lactulose  (CHRONULAC ) 10 GM/15ML solution Take 30 mLs (20 g total) by mouth 2 (two) times daily.     [START ON 09/07/2023] spironolactone  (ALDACTONE ) 25 MG tablet Take 1 tablet (25 mg total) by mouth daily.       Discharge Medications: Please see discharge summary for a list of discharge medications.  Relevant Imaging Results:  Relevant Lab Results:   Additional Information [id number]  Elouise LULLA Capri, RN

## 2023-09-06 NOTE — TOC Transition Note (Addendum)
 Transition of Care [facility]) - Discharge Note   Patient Details  Name: Jenna Franklin MRN: [id number] Date of Birth: 11-28-1940  [facility]) CM/SW Contact:  Elouise LULLA Capri, RN 09/06/2023, 2:47 PM   Clinical Narrative:     CM call to [place], Admissions, [employer], phone: [phone] regarding possible SNF admission today. Per Therisa, patient can admit to SNF today. Discharge order noted, discharge summary noted, faxed to [facility] SN[facility]. CM follow up in [place] MUST. CM uploaded required documentation to [place] MUST for PASSR approval. CM call to [employer], phone: [phone] regarding BLS transport scheduling. CM spoke to Ed. BLS transport scheduled for 1630.  CM follow up in [place] MUST regarding PASSR, PASSR is [id number], expires 10/06/2023.  Alert per [name] Sacks, NP, patient will have outpatient palliative. Marinell BROCKS, [facility] alerted to outpatient pallliative referral during short term rehab. CM secure message to [place], Admissions, [facility] regarding outpatient palliative referral.  Final next level of care: Skilled Nursing Facility Barriers to Discharge: No Barriers Identified   Patient Goals and CMS Choice    SNF   Discharge Placement   SNF          Discharge Plan and Services Additional resources added to the After Visit Summary for     Social Drivers of Health (SDOH) Interventions SDOH Screenings   Food Insecurity: No Food Insecurity (08/26/2023)  Housing: Low Risk  (08/26/2023)  Transportation Needs: No Transportation Needs (08/26/2023)  Utilities: Not At Risk (08/26/2023)  Alcohol Screen: Low Risk  (08/12/2022)  Depression (PHQ2-9): Low Risk  (08/12/2022)  Financial Resource Strain: Low Risk  (08/12/2022)  Physical Activity: Inactive (08/12/2022)  Social Connections: Socially Isolated (08/26/2023)  Stress: No Stress Concern Present (08/12/2022)  Tobacco Use: Low Risk  (08/26/2023)     Readmission Risk  Interventions     No data to display

## 2023-09-06 NOTE — Progress Notes (Addendum)
 Palliative Care Progress Note, Assessment & Plan   Patient Name: Jenna Franklin       Date: 09/06/2023 DOB: [birth year]  Age: 83 y.o. MRN#: [id number] Attending Physician: Jens Durand, MD Primary Care Physician: Justus Leita DEL, MD Admit Date: 08/26/2023  Subjective: Patient reports feeling okay today.  Complains of 4 out of 10 right hip pain.  Slept well last night.  Denies chest pain or shortness of breath.  HPI: Per previous HPI: 83 y.o. female  with past medical history of liver cirrhosis with splenomegaly and esophageal varices, anxiety and GERD admitted from home on 08/26/2023 with fall resulting in right hip pain.  Found to have right hip fracture, underwent orthopedic repair 7/10.   Imaging obtained in the ED concerning for colonic mass with suspicion of malignancy to the proximal transverse colon with omental implants-small pulmonary nodules also noted with concern for metastatic disease Acute PE also noted-course further complicated by acute GI bleed   Palliative medicine was consulted for assisting with goals of care conversations.  Summary of counseling/coordination of care: Extensive chart review completed prior to meeting patient including labs, vital signs, imaging, progress notes, orders, and available advanced directive documents from current and previous encounters.   After reviewing the patient's chart and assessing the patient at bedside, I spoke with patient in regards to symptom management and goals of care.   Elderly, ill-appearing female sitting upright in bed.  She request help with readjusting which was provided.  She is alert to self and location but is unable to state correct year or city that we are currently in.  Remains pleasantly confused.  Breakfast tray at bedside  mostly untouched with only juice being drank.  Even, unlabored respirations.  She is in no distress.  Daughter called PMT office this morning asking if her mother was to be discharged to [facility] today.  Contacted TOC to reach out to daughter about plan for discharge and bed status at facility.  Per MD note, patient is discharging to [facility] facility today.  Physical Exam Vitals reviewed.  Constitutional:      General: [name].    Appearance: She is ill-appearing. She is not toxic-appearing.  HENT:     Head: Normocephalic and atraumatic.     Mouth/Throat:     Mouth: Mucous membranes are moist.  Pulmonary:     Effort: Pulmonary effort is normal. No respiratory distress.  Musculoskeletal:     Right lower leg: No edema.     Left lower leg: No edema.  Skin:    General: Skin is warm and dry.  Neurological:     Mental Status: She is alert. She is disoriented.     Motor: Weakness present.    Recommendations/Plan: FULL CODE status as previously documented    Continue current supportive interventions Discharging to SNF day Referral to [facility] for OP palliative to follow  Total Time 80 minutes   Addendum 1650: RN notified this Clinical research associate that daughter is at bedside.  Went to bedside further discuss goals of care.  Daughter Jenna Franklin and son-in-law were at patient's bedside.  They advise she is transporting to [facility] today.  Due to recent  findings concerning for colonic mass, discussed importance of following up with palliative care at [facility].  When patient follows up with oncology, suggested referral for outpatient palliative care to follow for symptom management.  Further educated that if her mother in fact has colon cancer, she will most likely have a large symptom burden that will need to be managed. Daughter shares that she feels a PET scan is the best way to confirm exactly what is going on with her mother.  We also discussed option of  palliative coming into the home as patient already has caregivers multiple times per week. She is agreeable to [facility] referral. Referral made to TOC/[facility] for OP palliative services.   Time spent includes: Detailed review of medical records (labs, imaging, vital signs), medically appropriate exam (mental status, respiratory, cardiac, skin), discussed with treatment team, counseling and educating patient, family and staff, documenting clinical information, medication management and coordination of care.     Jenna Franklin Sacks, Jenna Franklin- [facility] Palliative Medicine Team  09/06/2023 11:45 AM  Office [phone]  Pager [phone]

## 2023-09-06 NOTE — Discharge Summary (Addendum)
 Physician Discharge Summary   Patient: Kesley Mullens MRN: [id number] DOB: [birth year]  Admit date:     08/26/2023  Discharge date: 09/06/23  Discharge Physician: AIDA CHO   PCP: Justus Leita DEL, MD   Recommendations at discharge:   Follow-up with your physician at the nursing home within 3 days of discharge Follow-up with orthopedic surgery, Debby Amber, PA, in 1 week. Follow-up with Dr. Babara, oncologist, in 1 week for further evaluation of suspected colon cancer.  Discharge Diagnoses: Principal Problem:   Right femoral fracture (HCC) Active Problems:   Anxiety   Acid reflux   Cirrhosis of liver with ascites (HCC)   Esophageal varices in cirrhosis (HCC)   Colonic mass   Weight loss   Protein-calorie malnutrition, severe   Closed fracture of right hip (HCC)   Hepatic encephalopathy (HCC)   Acute pulmonary embolism (HCC)   Acute GI bleeding  Resolved Problems:   * No resolved hospital problems. [facility] Course:  Ms. Zuleika Gallus is an 83 year old female with history of liver cirrhosis with splenomegaly and esophageal varices, anxiety, GERD, who presents emergency department for chief concerns of fall with resulting right hip pain.   07/10: admitted to hospitalist service. Underwent R hip fracture repair 07/11: PT/OT recs for SNF.  Discussion today with family regarding colon mass and options re further w/u  07/12: stable, await SNF thru the weekend 07/13: pt remains confused. Tachycardic into this afternoon worsening, dx new PE started on heparin   07/14: remains on heparin . Echo no RV strain. Ascites appears a bit worse today. Oncology consult - Dr Babara and I spoke w/ daughter [name], considering defer aggressive workup/treatment and focus on palliative/hospice, [name] will discuss further w/ family. At this time pt encephalopathic d/t hyperammonemia +/- underlying MCI/dementia and unable to participate in decisions.  07/15: ammonia normalized, pt still confused.  Intermittently tachycardic, remains on heparin . Family seems to have definitively decided on NO biopsy as this will not really change course of care. Palliative consult ordered - plan at this point to treat the treatable, pursue rehab to hopefully at least get patients stronger / enhance QOL.  Prognosis difficult - no real utility to PET and no real way to determine how quickly patient might decline.    Assessment and Plan:   Right femoral fracture Secondary to fall S/p Right Hip Intramedullary nail repair 08/26/2023 w/ Dr Lorelle Outpatient follow-up with [facility] clinic orthopedics in 1 week  Analgesics as needed. PT recommended discharge to SNF     Pulmonary Embolus No RV strain on echo Long-term anticoagulation contraindicated at this time because of GI bleeding. S/p IVC filter placement on 09/04/2023. No DVT in the lower extremities on venous duplex on 09/04/2023. SCDs for DVT prevention     Acute GI bleeding .  No clear etiology found. Endoscopic workup deferred per family's wishes    Colonic mass with infiltration into omentum, likely malignancy Pulmonary nodule, malignancy cannot be excluded Weight loss Given CT read of colonic mass with omental implants and small pulmonary nodules, high suspicion for malignancy and metastatic disease cannot be excluded at this time. Dr Jinny GI spoke 07/11 with patient's daughter, holding off on colonoscopy for now and considering overall frailty and other comorbidities may not be a good candidate for aggressive workup / treatment but would recommend further discussion with oncology team Oncology consult 07/14 option for biopsy mass to help dx/prognostication but overall pt is poor candidate for treatments [name], daughter, would prefer that patient can go  to SNF and have PET scan as an outpatient to confirm metastatic disease before making any further decisions regarding additional investigations and treatment. [name], daughter, gave  permission to discuss suspected colonic cancer diagnosis with the patient since she is more lucid.  Patient has been informed about colonic mass with suspected metastasis disease.  She has been informed that this is not confirmatory and additional workup is required to confirm this diagnosis.  She asked do you think it could be something?  It was explained that she would have to follow-up with the oncologist for further evaluation.       Cirrhosis complicated by Esophageal varices Ascites, hepatic encephalopathy, thrombocytopenia Continue nadolol  Continue Lasix  and spironolactone .  [name], daughter, declined paracentesis.     Acute metabolic encephalopathy d/t acute illness / SIRS / hepatic encephalopathy/delirium Underlying cognitive impairment suspect possibly dementia Pt does not have capacity to understand her medical situation / make complex decisions at this time    Continue lactulose  Mental status has improved and appears close to baseline. Daughter and son are involved in her decisions, daughter has been the point of contact which son is in agreement with   For now we are not informing patient of mass/cancer, per family request     Acute on chronic anemia Chronic thrombocytopenia H&H stable: 7.6-9.4-9.0-8.9.  Monitor H&H. S/p transfusion with 1 unit of PRBCs on 09/02/2023.     Anxiety No acute issues     Constipation, chronic  Discontinue MiraLAX  and Colace Continue lactulose      Urinary retention  Improved.  Foley catheter has already been discontinued     Goals of care Treat what can be treated Patient remains full code for now.  Follow-up with palliative care team.     Discharge plan was discussed with [name], daughter, over the phone.  She is agreeable with the discharge plan.  She is okay with related information above suspected colon cancer to the patient.          Consultants: TEFL teacher Palliative  care Vascular surgeon Procedures performed: Right Hip Intramedullary nail repair 08/26/2023 w/ Dr Lorelle IVC filter placement on 09/03/2023  Disposition: Skilled nursing facility Diet recommendation:  Discharge Diet Orders (From admission, onward)     Start     Ordered   09/06/23 0000  Diet - low sodium heart healthy        09/06/23 1357           Cardiac diet DISCHARGE MEDICATION: Allergies as of 09/06/2023       Reactions   Baclofen  Anxiety   Codeine Nausea And Vomiting   Sulfa Antibiotics Nausea Only   Prednisone  Anxiety        Medication List     STOP taking these medications    fluticasone  50 MCG/ACT nasal spray Commonly known as: FLONASE        TAKE these medications    furosemide  20 MG tablet Commonly known as: LASIX  Take 1 tablet (20 mg total) by mouth daily. TAKE ONE (1) TABLET BY MOUTH ONCE DAILY What changed:  medication strength how much to take how to take this when to take this   HYDROcodone -acetaminophen  5-325 MG tablet Commonly known as: NORCO/VICODIN Take 1 tablet by mouth every 6 (six) hours as needed for severe pain (pain score 7-10) or moderate pain (pain score 4-6).   lactulose  10 GM/15ML solution Commonly known as: CHRONULAC  Take 30 mLs (20 g total) by mouth 2 (two) times daily.   nadolol  20 MG  tablet Commonly known as: CORGARD  TAKE (1) TABLET BY MOUTH EVERY DAY   spironolactone  25 MG tablet Commonly known as: ALDACTONE  Take 1 tablet (25 mg total) by mouth daily. Start taking on: September 07, 2023               Discharge Care Instructions  (From admission, onward)           Start     Ordered   09/06/23 0000  Discharge wound care:       Comments: Follow-up with orthopedic surgeon in 1 week for right hip surgical wound.   09/06/23 1357            Contact information for follow-up providers     Charlene Debby BROCKS, PA-C Follow up in 1 week(s).   Specialties: Orthopedic Surgery, Emergency Medicine Contact  information: [address] [place] [address] [phone]         Babara Call, MD. Schedule an appointment as soon as possible for a visit in 1 week(s).   Specialty: Oncology Contact information: [address] [place] [address] [phone]              Contact information for after-discharge care     Destination     [facility] .   Service: Skilled Nursing Contact information: [address] [place]  [phone]                    Discharge Exam: Fredricka Weights   08/26/23 1143 09/03/23 1737  Weight: 56.5 kg 56.5 kg   GEN: NAD SKIN: Warm and dry EYES: No pallor or icterus ENT: MMM CV: RRR PULM: CTA B ABD: soft, distended (improving), NT, +BS CNS: AAO x 3, non focal EXT: Trace b/l ankle edema.  No erythema or tenderness  Condition at discharge: good  The results of significant diagnostics from this hospitalization (including imaging, microbiology, ancillary and laboratory) are listed below for reference.   Imaging Studies: US  Venous Img Lower Bilateral (DVT) Result Date: 09/04/2023 CLINICAL DATA:  Bilateral leg pain.  Known pulmonary emboli. EXAM: BILATERAL LOWER EXTREMITY VENOUS DOPPLER ULTRASOUND TECHNIQUE: Gray-scale sonography with graded compression, as well as color Doppler and duplex ultrasound were performed to evaluate the lower extremity deep venous systems from the level of the common femoral vein and including the common femoral, femoral, profunda femoral, popliteal and calf veins including the posterior tibial, peroneal and gastrocnemius veins when visible. The superficial great saphenous vein was also interrogated. Spectral Doppler was utilized to evaluate flow at rest and with distal augmentation maneuvers in the common femoral, femoral and popliteal veins. COMPARISON:  CTA chest 08/29/2023 FINDINGS: RIGHT LOWER EXTREMITY Common  Femoral Vein: No evidence of thrombus. Normal compressibility, respiratory phasicity and response to augmentation. Saphenofemoral Junction: No evidence of thrombus. Normal compressibility and flow on color Doppler imaging. Profunda Femoral Vein: No evidence of thrombus. Normal compressibility and flow on color Doppler imaging. Femoral Vein: No evidence of thrombus. Normal compressibility, respiratory phasicity and response to augmentation. Popliteal Vein: No evidence of thrombus. Normal compressibility, respiratory phasicity and response to augmentation. Calf Veins: No evidence of thrombus. Normal compressibility and flow on color Doppler imaging. Superficial Great Saphenous Vein: No evidence of thrombus. Normal compressibility. Venous Reflux:  None. Other Findings:  None. LEFT LOWER EXTREMITY Common Femoral Vein: No evidence of thrombus. Normal compressibility, respiratory phasicity and response to augmentation. Saphenofemoral Junction: No evidence of thrombus. Normal compressibility and  flow on color Doppler imaging. Profunda Femoral Vein: No evidence of thrombus. Normal compressibility and flow on color Doppler imaging. Femoral Vein: No evidence of thrombus. Normal compressibility, respiratory phasicity and response to augmentation. Popliteal Vein: No evidence of thrombus. Normal compressibility, respiratory phasicity and response to augmentation. Calf Veins: No evidence of thrombus. Normal compressibility and flow on color Doppler imaging. Superficial Great Saphenous Vein: No evidence of thrombus. Normal compressibility. Venous Reflux:  None. Other Findings:  None. IMPRESSION: No evidence of deep venous thrombosis in either lower extremity. Electronically Signed   By: Ranell Bring M.D.   On: 09/04/2023 14:41   PERIPHERAL VASCULAR CATHETERIZATION Result Date: 09/03/2023 See surgical note for result.  US  ASCITES (ABDOMEN LIMITED) Result Date: 09/02/2023 CLINICAL DATA:  83 year old female with a history of  cirrhosis, splenomegaly, esophageal varices, and recent imaging concerning for colonic mass/malignancy. Diagnostic and therapeutic paracentesis requested; however, patient's family requests to defer paracentesis and proceed with limited ultrasound instead. EXAM: ULTRASOUND ABDOMEN LIMITED COMPARISON:  CT C/A/P - 08/26/23 FINDINGS: Limited ultrasound done of all 4 quadrants. There is a small volume of ascites present with pockets large enough for paracentesis should family agree to procedure in the future. Patient is to be started on IV Lasix  in the meantime for hopeful non-invasive resolution of fluid. IMPRESSION: Small volume ascites. Ultrasound performed by: Sherrilee Bal, PA-C Electronically Signed   By: Marcey Moan M.D.   On: 09/02/2023 16:31   ECHOCARDIOGRAM COMPLETE Result Date: 08/31/2023    ECHOCARDIOGRAM REPORT   Patient Name:   Tagan MUNN Grafton Date of Exam: 08/30/2023 Medical Rec #:  [id number]          Height:       63.0 in Accession #:    [id number]         Weight:       124.6 lb Date of Birth:  1940-11-17          BSA:          1.581 m Patient Age:    83 years           BP:           105/58 mmHg Patient Gender: F                  HR:           82 bpm. Exam Location:  [facility] Procedure: 2D Echo, Cardiac Doppler and Color Doppler (Both Spectral and Color            Flow Doppler were utilized during procedure). Indications:     Pulmonary embolus I26.09  History:         Patient has prior history of Echocardiogram examinations, most                  recent 11/05/2005. CHF; Risk Factors:Hypertension. Anxiety.  Sonographer:     Christopher Furnace Referring Phys:  [id number] JAN A MANSY Diagnosing Phys: Dwayne D Callwood MD  Sonographer Comments: Technically challenging study due to limited acoustic windows, no apical window and no subcostal window. IMPRESSIONS  1. Left ventricular ejection fraction, by estimation, is 55 to 60%. The left ventricle has normal function. The left ventricle has no regional wall motion  abnormalities. Left ventricular diastolic function could not be evaluated.  2. Right ventricular systolic function is normal. The right ventricular size is normal.  3. The mitral valve is normal in structure. Trivial mitral valve regurgitation.  4. The aortic valve is normal in structure.  Aortic valve regurgitation is not visualized. FINDINGS  Left Ventricle: Left ventricular ejection fraction, by estimation, is 55 to 60%. The left ventricle has normal function. The left ventricle has no regional wall motion abnormalities. Strain was performed and the global longitudinal strain is indeterminate. The left ventricular internal cavity size was normal in size. There is borderline left ventricular hypertrophy. Left ventricular diastolic function could not be evaluated. Right Ventricle: The right ventricular size is normal. No increase in right ventricular wall thickness. Right ventricular systolic function is normal. Left Atrium: Left atrial size was normal in size. Right Atrium: Right atrial size was normal in size. Pericardium: There is no evidence of pericardial effusion. Mitral Valve: The mitral valve is normal in structure. Trivial mitral valve regurgitation. Tricuspid Valve: The tricuspid valve is normal in structure. Tricuspid valve regurgitation is mild. Aortic Valve: The aortic valve is normal in structure. Aortic valve regurgitation is not visualized. Pulmonic Valve: The pulmonic valve was grossly normal. Pulmonic valve regurgitation is not visualized. Aorta: The ascending aorta was not well visualized. IAS/Shunts: No atrial level shunt detected by color flow Doppler. Additional Comments: 3D was performed not requiring image post processing on an independent workstation and was indeterminate.  LEFT VENTRICLE PLAX 2D LVIDd:         3.90 cm LVIDs:         2.80 cm LV PW:         1.00 cm LV IVS:        1.10 cm LVOT diam:     2.00 cm LVOT Area:     3.14 cm  LEFT ATRIUM         Index LA diam:    3.30 cm 2.09 cm/m    AORTA Ao Root diam: 2.90 cm  SHUNTS Systemic Diam: 2.00 cm Cara JONETTA Lovelace MD Electronically signed by Cara JONETTA Lovelace MD Signature Date/Time: 08/31/2023/7:29:38 AM    Final    CT Angio Chest Pulmonary Embolism (PE) W or WO Contrast Addendum Date: 08/29/2023 ADDENDUM REPORT: 08/29/2023 23:09 ADDENDUM: These results were called by telephone at the time of interpretation on 08/29/2023 at 10:11 p.m. to provider Dr. JINNY Peaches, who verbally acknowledged these results. Electronically Signed   By: Greig Pique M.D.   On: 08/29/2023 23:09   Result Date: 08/29/2023 CLINICAL DATA:  High probability for PE EXAM: CT ANGIOGRAPHY CHEST WITH CONTRAST TECHNIQUE: Multidetector CT imaging of the chest was performed using the standard protocol during bolus administration of intravenous contrast. Multiplanar CT image reconstructions and MIPs were obtained to evaluate the vascular anatomy. RADIATION DOSE REDUCTION: This exam was performed according to the departmental dose-optimization program which includes automated exposure control, adjustment of the mA and/or kV according to patient size and/or use of iterative reconstruction technique. CONTRAST:  75mL OMNIPAQUE  IOHEXOL  350 MG/ML SOLN COMPARISON:  CT of the chest abdomen and pelvis 08/26/2023. the FINDINGS: Cardiovascular: There are segmental and subsegmental pulmonary emboli in the right lower lobe. No other pulmonary emboli are seen. The heart and aorta are normal in size. There is no pericardial effusion. There are atherosclerotic calcifications of the aorta. There is an aberrant right subclavian artery. Mediastinum/Nodes: No enlarged mediastinal, hilar, or axillary lymph nodes. Thyroid  gland, trachea, and esophagus demonstrate no significant findings. Lungs/Pleura: There are trace bilateral pleural effusions with a small amount of atelectasis in the bilateral lower lobes. There is a 2 mm nodule in the superior segment of the left lower lobe image 6/40. There is a stable  right middle lobe  nodule measuring 7 mm. The lungs are otherwise clear. Upper Abdomen: There is heterogeneous nodular liver contour. The spleen is enlarged. There is a moderate amount of ascites in the upper abdomen. Musculoskeletal: No acute fractures are identified. T12 chronic compression deformity is unchanged. Review of the MIP images confirms the above findings. IMPRESSION: 1. Segmental and subsegmental pulmonary emboli in the right lower lobe. There is evidence for right heart strain (RV/LV Ratio = 1.2) consistent with at least submassive (intermediate risk) PE. The presence of right heart strain has been associated with an increased risk of morbidity and mortality. 2. Trace bilateral pleural effusions with small amount of atelectasis in the lower lobes. 3. Stable 7 mm right middle lobe nodule. 2 mm left lower lobe nodule. 4. Cirrhotic liver with splenomegaly and moderate ascites in the upper abdomen. Aortic Atherosclerosis (ICD10-I70.0). Electronically Signed: By: Greig Pique M.D. On: 08/29/2023 22:02   DG HIP UNILAT WITH PELVIS 2-3 VIEWS RIGHT Result Date: 08/26/2023 CLINICAL DATA:  ORIF of the right hip. EXAM: DG HIP (WITH OR WITHOUT PELVIS) 2-3V RIGHT COMPARISON:  Right hip radiograph dated 08/25/2025. FINDINGS: Six intraoperative fluoroscopic spot images provided. The total fluoroscopic time is 1 minute, 22 seconds with a cumulative air Karma of 17.06 mGy. Status post ORIF of the right femur. IMPRESSION: Intraoperative fluoroscopic guidance for ORIF of the right femur. Electronically Signed   By: Vanetta Chou M.D.   On: 08/26/2023 17:20   DG C-Arm 1-60 Min-No Report Result Date: 08/26/2023 Fluoroscopy was utilized by the requesting physician.  No radiographic interpretation.   CT CHEST ABDOMEN PELVIS WO CONTRAST Result Date: 08/26/2023 CLINICAL DATA:  Trauma. EXAM: CT CHEST, ABDOMEN AND PELVIS WITHOUT CONTRAST TECHNIQUE: Multidetector CT imaging of the chest, abdomen and pelvis was  performed following the standard protocol without IV contrast. RADIATION DOSE REDUCTION: This exam was performed according to the departmental dose-optimization program which includes automated exposure control, adjustment of the mA and/or kV according to patient size and/or use of iterative reconstruction technique. COMPARISON:  None Available. FINDINGS: Evaluation of this exam is limited in the absence of intravenous contrast. CT CHEST FINDINGS Cardiovascular: There is no cardiomegaly or pericardial effusion. The thoracic aorta and the central pulmonary arteries are grossly unremarkable. There is a left-sided aortic arch with aberrant right subclavian artery anatomy. Mediastinum/Nodes: No hilar or mediastinal adenopathy. Small hiatal hernia. The esophagus is grossly unremarkable. No mediastinal fluid collection. Lungs/Pleura: Minimal bibasilar subpleural atelectasis. There is a 7 mm right middle lobe and a 4 mm right middle lobe nodules. A 9 mm nodular density at the right lung base along the diaphragm. No consolidative changes. There is no pleural effusion pneumothorax. The central airways are patent. Musculoskeletal: Osteopenia. Old T12 compression fracture. No acute osseous pathology. CT ABDOMEN PELVIS FINDINGS No intra-abdominal free air.  Small ascites. Hepatobiliary: Cirrhosis.  No biliary dilatation.  Cholecystectomy. Pancreas: Unremarkable. No pancreatic ductal dilatation or surrounding inflammatory changes. Spleen: Splenomegaly measuring 15 cm in length. Adrenals/Urinary Tract: The adrenal glands unremarkable. Small right renal upper pole cyst. There is no hydronephrosis or nephrolithiasis on either side. The visualized ureters and urinary bladder marked. Stomach/Bowel: There is sigmoid diverticulosis. Diffuse thickened appearance of the colon may be related to hepatic colopathy and size. Colitis is not excluded. There is however an area of irregularity and thickening of the colonic wall with nodular  protrusion involving the proximal transverse colon (axial 70/4 and coronal 28/7) concerning for malignancy. Further evaluation with colonoscopy recommended. There is no bowel obstruction. The appendix  is not well visualized due to ascites. Vascular/Lymphatic: Mild aortoiliac atherosclerotic disease. The IVC is unremarkable. No portal venous gas. No adenopathy. Several small nodules adjacent to the proximal transverse colon as well as small omental nodules in the right upper abdomen (64/4) metastasis. Reproductive: Hysterectomy. Other: Soft tissue nodules in the anterior abdomen and involving the right rectus sheath measure up to 3.8 x 2.0 cm consistent with metastatic implants. Musculoskeletal: Osteopenia with degenerative changes of the spine. Old L1 compression fracture with anterior wedging. There is mildly angulated fracture of the right femoral neck involving the basicervical and trochanteric region. IMPRESSION: 1. Mildly angulated fracture of the right femoral neck. 2. No acute/traumatic intrathoracic pathology. 3. Cirrhosis with splenomegaly and small ascites. 4. Sigmoid diverticulosis. 5. Findings concerning for colonic mass/malignancy of the proximal transverse colon with omental implants. Further evaluation with colonoscopy is recommended. 6. Small pulmonary nodules, metastatic disease is not excluded. 7.  Aortic Atherosclerosis (ICD10-I70.0). Electronically Signed   By: Vanetta Chou M.D.   On: 08/26/2023 14:58   CT T-SPINE NO CHARGE Result Date: 08/26/2023 CLINICAL DATA:  Trauma. EXAM: CT THORACIC AND LUMBAR SPINE WITHOUT CONTRAST TECHNIQUE: Multidetector CT imaging of the thoracic and lumbar spine was performed without contrast. Multiplanar CT image reconstructions were also generated. RADIATION DOSE REDUCTION: This exam was performed according to the departmental dose-optimization program which includes automated exposure control, adjustment of the mA and/or kV according to patient size and/or  use of iterative reconstruction technique. COMPARISON:  Lumbar spine CT dated 08/19/2020. FINDINGS: CT THORACIC SPINE FINDINGS Alignment: No acute subluxation. Vertebrae: No acute fracture. The bones are osteopenic. Old T12 compression fracture with approximately 50% loss of vertebral body height anteriorly and anterior wedging. This is relatively similar to the lumbar spine CT of 08/19/2020. Paraspinal and other soft tissues: Negative. Disc levels: No acute findings with degenerative changes. CT LUMBAR SPINE FINDINGS Segmentation: 5 lumbar type vertebrae. Alignment: No acute subluxation. Vertebrae: Old appearing L1 compression fracture with approximately 50% loss of vertebral body height and anterior wedging, new since the prior CT. No definite acute fracture. The bones are osteopenic. Paraspinal and other soft tissues: Negative. Disc levels: Degenerative changes. IMPRESSION: 1. No acute/traumatic thoracic or lumbar spine pathology. 2. Old T12 and L1 compression fractures. Electronically Signed   By: Vanetta Chou M.D.   On: 08/26/2023 14:47   CT L-SPINE NO CHARGE Result Date: 08/26/2023 CLINICAL DATA:  Trauma. EXAM: CT THORACIC AND LUMBAR SPINE WITHOUT CONTRAST TECHNIQUE: Multidetector CT imaging of the thoracic and lumbar spine was performed without contrast. Multiplanar CT image reconstructions were also generated. RADIATION DOSE REDUCTION: This exam was performed according to the departmental dose-optimization program which includes automated exposure control, adjustment of the mA and/or kV according to patient size and/or use of iterative reconstruction technique. COMPARISON:  Lumbar spine CT dated 08/19/2020. FINDINGS: CT THORACIC SPINE FINDINGS Alignment: No acute subluxation. Vertebrae: No acute fracture. The bones are osteopenic. Old T12 compression fracture with approximately 50% loss of vertebral body height anteriorly and anterior wedging. This is relatively similar to the lumbar spine CT of  08/19/2020. Paraspinal and other soft tissues: Negative. Disc levels: No acute findings with degenerative changes. CT LUMBAR SPINE FINDINGS Segmentation: 5 lumbar type vertebrae. Alignment: No acute subluxation. Vertebrae: Old appearing L1 compression fracture with approximately 50% loss of vertebral body height and anterior wedging, new since the prior CT. No definite acute fracture. The bones are osteopenic. Paraspinal and other soft tissues: Negative. Disc levels: Degenerative changes. IMPRESSION: 1. No acute/traumatic thoracic  or lumbar spine pathology. 2. Old T12 and L1 compression fractures. Electronically Signed   By: Vanetta Chou M.D.   On: 08/26/2023 14:47   DG Elbow Complete Right Result Date: 08/26/2023 CLINICAL DATA:  Fall. EXAM: RIGHT ELBOW - COMPLETE 3+ VIEW COMPARISON:  08/19/2020. FINDINGS: There is no evidence of fracture, dislocation, or joint effusion. Mild degenerative spurring of the coronoid process of the ulna. Soft tissues are unremarkable. IMPRESSION: No acute fracture or dislocation of the right elbow. Electronically Signed   By: Harrietta Sherry M.D.   On: 08/26/2023 14:08   DG FEMUR, MIN 2 VIEWS RIGHT Result Date: 08/26/2023 CLINICAL DATA:  Right hip pain after fall. EXAM: RIGHT FEMUR 2 VIEWS COMPARISON:  Same-day pelvic radiographs dated 08/26/2023 at 12:03 p.m. FINDINGS: Diffuse osseous demineralization. Redemonstrated probable basicervical fracture of the right proximal femur. Intertrochanteric extension cannot be excluded. No additional fracture identified. No dislocation. Peripheral vascular calcifications. IMPRESSION: Redemonstrated probable basicervical fracture of the right proximal femur. Intertrochanteric extension cannot be excluded. No additional fracture identified. Electronically Signed   By: Harrietta Sherry M.D.   On: 08/26/2023 14:06   CT Head Wo Contrast Result Date: 08/26/2023 CLINICAL DATA:  Altered mental status.  Fall. EXAM: CT HEAD WITHOUT CONTRAST CT  CERVICAL SPINE WITHOUT CONTRAST TECHNIQUE: Multidetector CT imaging of the head and cervical spine was performed following the standard protocol without intravenous contrast. Multiplanar CT image reconstructions of the cervical spine were also generated. RADIATION DOSE REDUCTION: This exam was performed according to the departmental dose-optimization program which includes automated exposure control, adjustment of the mA and/or kV according to patient size and/or use of iterative reconstruction technique. COMPARISON:  None Available. FINDINGS: CT HEAD FINDINGS Brain: No acute intracranial hemorrhage. No focal mass lesion. No CT evidence of acute infarction. No midline shift or mass effect. No hydrocephalus. Basilar cisterns are patent. There are periventricular and subcortical white matter hypodensities. Generalized cortical atrophy. Vascular: No hyperdense vessel or unexpected calcification. Skull: Normal. Negative for fracture or focal lesion. Sinuses/Orbits: Paranasal sinuses and mastoid air cells are clear. Orbits are clear. Other: None. CT CERVICAL SPINE FINDINGS Alignment: Normal alignment of the cervical vertebral bodies. Skull base and vertebrae: Normal craniocervical junction. No loss of vertebral body height or disc height. Normal facet articulation. No evidence of fracture. Soft tissues and spinal canal: No prevertebral soft tissue swelling. No perispinal or epidural hematoma. Disc levels: Bulky RIGHT facet hypertrophy from C3 to C5. Anterior endplate spurring R4-R2. No acute findings Upper chest: Clear Other: None IMPRESSION: HEAD CT: 1. No acute intracranial findings. 2. Atrophy and white matter microvascular disease. CERVICAL SPINE CT: 1. No cervical spine fracture. 2. Cervical spondylosis. Electronically Signed   By: Jackquline Boxer M.D.   On: 08/26/2023 12:32   CT Cervical Spine Wo Contrast Result Date: 08/26/2023 CLINICAL DATA:  Altered mental status.  Fall. EXAM: CT HEAD WITHOUT CONTRAST CT  CERVICAL SPINE WITHOUT CONTRAST TECHNIQUE: Multidetector CT imaging of the head and cervical spine was performed following the standard protocol without intravenous contrast. Multiplanar CT image reconstructions of the cervical spine were also generated. RADIATION DOSE REDUCTION: This exam was performed according to the departmental dose-optimization program which includes automated exposure control, adjustment of the mA and/or kV according to patient size and/or use of iterative reconstruction technique. COMPARISON:  None Available. FINDINGS: CT HEAD FINDINGS Brain: No acute intracranial hemorrhage. No focal mass lesion. No CT evidence of acute infarction. No midline shift or mass effect. No hydrocephalus. Basilar cisterns are patent. There are  periventricular and subcortical white matter hypodensities. Generalized cortical atrophy. Vascular: No hyperdense vessel or unexpected calcification. Skull: Normal. Negative for fracture or focal lesion. Sinuses/Orbits: Paranasal sinuses and mastoid air cells are clear. Orbits are clear. Other: None. CT CERVICAL SPINE FINDINGS Alignment: Normal alignment of the cervical vertebral bodies. Skull base and vertebrae: Normal craniocervical junction. No loss of vertebral body height or disc height. Normal facet articulation. No evidence of fracture. Soft tissues and spinal canal: No prevertebral soft tissue swelling. No perispinal or epidural hematoma. Disc levels: Bulky RIGHT facet hypertrophy from C3 to C5. Anterior endplate spurring R4-R2. No acute findings Upper chest: Clear Other: None IMPRESSION: HEAD CT: 1. No acute intracranial findings. 2. Atrophy and white matter microvascular disease. CERVICAL SPINE CT: 1. No cervical spine fracture. 2. Cervical spondylosis. Electronically Signed   By: Jackquline Boxer M.D.   On: 08/26/2023 12:32   DG Chest Portable 1 View Result Date: 08/26/2023 CLINICAL DATA:  Fall, right hip fracture EXAM: PORTABLE CHEST 1 VIEW COMPARISON:   08/19/2020 FINDINGS: The patient is rotated to the left on today's radiograph, reducing diagnostic sensitivity and specificity. Low lung volumes are present, causing crowding of the pulmonary vasculature. Age indeterminate deformity of the left sixth rib posterolaterally, correlate with any tenderness in this vicinity in determining acuity. Accounting for the low lung volumes, the lungs appear clear. Cardiac and mediastinal margins appear normal. IMPRESSION: 1. Age indeterminate fracture of the left sixth rib posterolaterally, correlate with any tenderness in this vicinity in determining acuity. 2. Low lung volumes. Electronically Signed   By: Ryan Salvage M.D.   On: 08/26/2023 12:26   DG Hip Unilat With Pelvis 2-3 Views Right Result Date: 08/26/2023 CLINICAL DATA:  Fall, right hip pain EXAM: DG HIP (WITH OR WITHOUT PELVIS) 2-3V RIGHT COMPARISON:  None Available. FINDINGS: Suboptimal positioning. High suspicion for fracture, either basicervical or intertrochanteric. Intra-articular femoral neck fracture favored but not absolutely certain. Consider CT of the right hip for differentiation. Spurring of both acetabula.  Bony demineralization. IMPRESSION: 1. Right proximal femoral fracture likely basicervical but possibly intertrochanteric. CT could help differentiate. 2. Bony demineralization. 3. Spurring of both acetabula. Electronically Signed   By: Ryan Salvage M.D.   On: 08/26/2023 12:24    Microbiology: Results for orders placed or performed during the hospital encounter of 04/21/19  Urine Culture     Status: Abnormal   Collection Time: 04/21/19  2:55 PM   Specimen: Urine, Clean Catch  Result Value Ref Range Status   Specimen Description   Final    URINE, CLEAN CATCH Performed at [facility], [address], [place], [place] [address]    Special Requests   Final    NONE Performed at [facility], [address], [place], [place] [address]    Culture (A)   Final    <10,000 COLONIES/mL INSIGNIFICANT GROWTH Performed at [facility], [address], [place], [place] [address]    Report Status 04/23/2019 FINAL  Final    Labs: CBC: Recent Labs  Lab 08/31/23 0515 09/01/23 0514 09/02/23 0415 09/03/23 0417 09/04/23 0501 09/05/23 1235  WBC 2.7* 2.8* 2.5*  --  4.4 4.7  NEUTROABS  --   --   --   --   --  3.5  HGB 8.4* 8.2* 7.6* 9.4* 9.0* 8.9*  HCT 26.1* 25.5* 24.1* 29.1* 27.0* 27.7*  MCV 89.7 90.1 91.6  --  90.0 90.5  PLT 117* 105* 107*  --  120* 131*  Basic Metabolic Panel: Recent Labs  Lab 08/31/23 1233 09/01/23 0514 09/02/23 0415 09/04/23 0501 09/05/23 1235  NA 136 137 138 137 133*  K 3.7 3.4* 3.9 3.6 3.8  CL 104 107 109 105 104  CO2 20* 24 23 24  20*  GLUCOSE 168* 181* 177* 168* 189*  BUN 12 14 14 18 15   CREATININE 0.41* 0.55 0.55 0.57 0.60  CALCIUM 8.2* 8.2* 8.3* 8.4* 8.4*  MG  --   --  2.0  --  2.0  PHOS  --   --  2.1*  --  3.1   Liver Function Tests: Recent Labs  Lab 08/31/23 1233 09/01/23 0514 09/02/23 0415 09/04/23 0501 09/05/23 1235  AST 48* 42* 39 47* 68*  ALT 16 18 21 25  33  ALKPHOS 79 83 79 88 93  BILITOT 1.1 1.1 1.2 1.5* 1.2  PROT 6.0* 5.3* 5.6* 5.9* 6.0*  ALBUMIN 2.6* 2.4* 2.6* 2.6* 2.5*   CBG: No results for input(s): GLUCAP in the last 168 hours.  Discharge time spent: greater than 30 minutes.  Signed: AIDA CHO, MD [facility] 09/06/2023

## 2023-09-06 NOTE — TOC Progression Note (Signed)
 Transition of Care [facility]) - Progression Note    Patient Details  Name: Jenna Franklin MRN: [id number] Date of Birth: [birth year]  Transition of Care [facility]) CM/SW Contact  Elouise LULLA Capri, RN 09/06/2023, 2:11 PM  Clinical Narrative:     The above named patient is recommended to go to Short Term Rehab for strengthening and gait training for balance.  It is expected that the Short Term Rehab stay will be less than 30 days.  The patient is expected to return home after Rehab.   Expected Discharge Plan and Services    SNF     Expected Discharge Date: 09/06/23                Social Determinants of Health (SDOH) Interventions SDOH Screenings   Food Insecurity: No Food Insecurity (08/26/2023)  Housing: Low Risk  (08/26/2023)  Transportation Needs: No Transportation Needs (08/26/2023)  Utilities: Not At Risk (08/26/2023)  Alcohol Screen: Low Risk  (08/12/2022)  Depression (PHQ2-9): Low Risk  (08/12/2022)  Financial Resource Strain: Low Risk  (08/12/2022)  Physical Activity: Inactive (08/12/2022)  Social Connections: Socially Isolated (08/26/2023)  Stress: No Stress Concern Present (08/12/2022)  Tobacco Use: Low Risk  (08/26/2023)    Readmission Risk Interventions     No data to display

## 2023-09-07 NOTE — Progress Notes (Signed)
 [facility] Liaison Note  Received a referral from [facility], Elouise Capri, RN, for palliative care follow up after patient discharges from [facility].    Referral submitted today.  Please call with any palliative care related questions or concerns.  Thank you for the opportunity to participate in this patient's care  [facility] Liaison 336 [phone]

## 2023-09-08 ENCOUNTER — Telehealth: Payer: Self-pay | Admitting: *Deleted

## 2023-09-08 NOTE — Telephone Encounter (Signed)
 Patient went back to her rehab and that daughter had told him that she is going to be having a scan but they did not see any of that information on the papers that was sent back to rehab.  She would like Dr. Babara to let her she is going to order that PET scan for her mother

## 2023-09-08 NOTE — Telephone Encounter (Signed)
 Let her daughter [name] Rosin there is that I sent a message to Dr. Babara and her team and will get back to the daughter after they see this message.

## 2023-09-09 ENCOUNTER — Other Ambulatory Visit: Payer: Self-pay

## 2023-09-09 DIAGNOSIS — K6389 Other specified diseases of intestine: Secondary | ICD-10-CM

## 2023-09-09 NOTE — Telephone Encounter (Signed)
 Call returned. [name] would like call from Dr. Babara, she has additional questionsl questions and her mother is unable to come to visit

## 2023-09-09 NOTE — Telephone Encounter (Signed)
 Dr. Babara has discussed plan with [name] [phone]). Please schedule and notify [name] of appt details:   PET scan ASAP  Mychart MD and Palliative - 1 week after PET

## 2023-09-09 NOTE — Telephone Encounter (Signed)
 Mychart Dr. Babara and telephone Sidra B is ok. Daughter said pt  cannot come to visit physically

## 2023-09-10 DIAGNOSIS — G9341 Metabolic encephalopathy: Secondary | ICD-10-CM | POA: Diagnosis not present

## 2023-09-10 DIAGNOSIS — R188 Other ascites: Secondary | ICD-10-CM | POA: Diagnosis not present

## 2023-09-10 DIAGNOSIS — D649 Anemia, unspecified: Secondary | ICD-10-CM | POA: Diagnosis not present

## 2023-09-10 DIAGNOSIS — I2699 Other pulmonary embolism without acute cor pulmonale: Secondary | ICD-10-CM | POA: Diagnosis not present

## 2023-09-10 DIAGNOSIS — F0390 Unspecified dementia without behavioral disturbance: Secondary | ICD-10-CM | POA: Diagnosis not present

## 2023-09-10 DIAGNOSIS — C189 Malignant neoplasm of colon, unspecified: Secondary | ICD-10-CM | POA: Diagnosis not present

## 2023-09-10 DIAGNOSIS — C786 Secondary malignant neoplasm of retroperitoneum and peritoneum: Secondary | ICD-10-CM | POA: Diagnosis not present

## 2023-09-10 DIAGNOSIS — C7800 Secondary malignant neoplasm of unspecified lung: Secondary | ICD-10-CM | POA: Diagnosis not present

## 2023-09-10 DIAGNOSIS — G3184 Mild cognitive impairment, so stated: Secondary | ICD-10-CM | POA: Diagnosis not present

## 2023-09-10 DIAGNOSIS — K7682 Hepatic encephalopathy: Secondary | ICD-10-CM | POA: Diagnosis not present

## 2023-09-10 DIAGNOSIS — K922 Gastrointestinal hemorrhage, unspecified: Secondary | ICD-10-CM | POA: Diagnosis not present

## 2023-09-10 DIAGNOSIS — S7291XA Unspecified fracture of right femur, initial encounter for closed fracture: Secondary | ICD-10-CM | POA: Diagnosis not present

## 2023-09-13 ENCOUNTER — Telehealth: Payer: Self-pay | Admitting: *Deleted

## 2023-09-13 DIAGNOSIS — G3184 Mild cognitive impairment, so stated: Secondary | ICD-10-CM | POA: Diagnosis not present

## 2023-09-13 DIAGNOSIS — C189 Malignant neoplasm of colon, unspecified: Secondary | ICD-10-CM | POA: Diagnosis not present

## 2023-09-13 DIAGNOSIS — D649 Anemia, unspecified: Secondary | ICD-10-CM | POA: Diagnosis not present

## 2023-09-13 DIAGNOSIS — R188 Other ascites: Secondary | ICD-10-CM | POA: Diagnosis not present

## 2023-09-13 DIAGNOSIS — K922 Gastrointestinal hemorrhage, unspecified: Secondary | ICD-10-CM | POA: Diagnosis not present

## 2023-09-13 DIAGNOSIS — G9341 Metabolic encephalopathy: Secondary | ICD-10-CM | POA: Diagnosis not present

## 2023-09-13 DIAGNOSIS — I2699 Other pulmonary embolism without acute cor pulmonale: Secondary | ICD-10-CM | POA: Diagnosis not present

## 2023-09-13 DIAGNOSIS — S7291XA Unspecified fracture of right femur, initial encounter for closed fracture: Secondary | ICD-10-CM | POA: Diagnosis not present

## 2023-09-13 DIAGNOSIS — K7682 Hepatic encephalopathy: Secondary | ICD-10-CM | POA: Diagnosis not present

## 2023-09-13 NOTE — Telephone Encounter (Signed)
 Please r/s PET to next week and r/s Mychart to be approx 1 week after PET. Please notify pt's daughter of new appt details [phone])

## 2023-09-13 NOTE — Telephone Encounter (Signed)
 Daughter called and said they had to cancel the PET scan because they do not have transport for patients this week at her nursing home . Cannot be this week at all because that is when he is off.  Then we have to try to get a PET scan after this week and after that we might have to reschedule the appointment for the Dr. I told the daughter that we will try to set it up again but definitely not this week and then based on that they might have to change the doctor visit to but not sure but the team will touch base with the daughter.  Daughter's number is [phone].  She would like to have a call back about all of this once get set up again

## 2023-09-14 ENCOUNTER — Ambulatory Visit

## 2023-09-14 DIAGNOSIS — I2699 Other pulmonary embolism without acute cor pulmonale: Secondary | ICD-10-CM | POA: Diagnosis not present

## 2023-09-14 DIAGNOSIS — K7460 Unspecified cirrhosis of liver: Secondary | ICD-10-CM | POA: Diagnosis not present

## 2023-09-14 DIAGNOSIS — S7291XA Unspecified fracture of right femur, initial encounter for closed fracture: Secondary | ICD-10-CM | POA: Diagnosis not present

## 2023-09-14 DIAGNOSIS — K7682 Hepatic encephalopathy: Secondary | ICD-10-CM | POA: Diagnosis not present

## 2023-09-14 DIAGNOSIS — D696 Thrombocytopenia, unspecified: Secondary | ICD-10-CM | POA: Diagnosis not present

## 2023-09-14 DIAGNOSIS — D374 Neoplasm of uncertain behavior of colon: Secondary | ICD-10-CM | POA: Diagnosis not present

## 2023-09-14 DIAGNOSIS — R739 Hyperglycemia, unspecified: Secondary | ICD-10-CM | POA: Diagnosis not present

## 2023-09-14 DIAGNOSIS — R188 Other ascites: Secondary | ICD-10-CM | POA: Diagnosis not present

## 2023-09-14 DIAGNOSIS — D649 Anemia, unspecified: Secondary | ICD-10-CM | POA: Diagnosis not present

## 2023-09-14 DIAGNOSIS — I8500 Esophageal varices without bleeding: Secondary | ICD-10-CM | POA: Diagnosis not present

## 2023-09-15 DIAGNOSIS — K7682 Hepatic encephalopathy: Secondary | ICD-10-CM | POA: Diagnosis not present

## 2023-09-15 DIAGNOSIS — S7291XD Unspecified fracture of right femur, subsequent encounter for closed fracture with routine healing: Secondary | ICD-10-CM | POA: Diagnosis not present

## 2023-09-15 DIAGNOSIS — G3184 Mild cognitive impairment, so stated: Secondary | ICD-10-CM | POA: Diagnosis not present

## 2023-09-15 DIAGNOSIS — G9341 Metabolic encephalopathy: Secondary | ICD-10-CM | POA: Diagnosis not present

## 2023-09-15 DIAGNOSIS — C7800 Secondary malignant neoplasm of unspecified lung: Secondary | ICD-10-CM | POA: Diagnosis not present

## 2023-09-15 DIAGNOSIS — C189 Malignant neoplasm of colon, unspecified: Secondary | ICD-10-CM | POA: Diagnosis not present

## 2023-09-15 DIAGNOSIS — I2699 Other pulmonary embolism without acute cor pulmonale: Secondary | ICD-10-CM | POA: Diagnosis not present

## 2023-09-15 DIAGNOSIS — R188 Other ascites: Secondary | ICD-10-CM | POA: Diagnosis not present

## 2023-09-15 DIAGNOSIS — Z9181 History of falling: Secondary | ICD-10-CM | POA: Diagnosis not present

## 2023-09-15 DIAGNOSIS — K922 Gastrointestinal hemorrhage, unspecified: Secondary | ICD-10-CM | POA: Diagnosis not present

## 2023-09-15 DIAGNOSIS — D649 Anemia, unspecified: Secondary | ICD-10-CM | POA: Diagnosis not present

## 2023-09-15 DIAGNOSIS — C786 Secondary malignant neoplasm of retroperitoneum and peritoneum: Secondary | ICD-10-CM | POA: Diagnosis not present

## 2023-09-15 NOTE — Telephone Encounter (Signed)
Appts r/s . 

## 2023-09-17 DIAGNOSIS — Z95828 Presence of other vascular implants and grafts: Secondary | ICD-10-CM | POA: Diagnosis not present

## 2023-09-17 DIAGNOSIS — D649 Anemia, unspecified: Secondary | ICD-10-CM | POA: Diagnosis not present

## 2023-09-17 DIAGNOSIS — G9341 Metabolic encephalopathy: Secondary | ICD-10-CM | POA: Diagnosis not present

## 2023-09-17 DIAGNOSIS — I2699 Other pulmonary embolism without acute cor pulmonale: Secondary | ICD-10-CM | POA: Diagnosis not present

## 2023-09-17 DIAGNOSIS — K922 Gastrointestinal hemorrhage, unspecified: Secondary | ICD-10-CM | POA: Diagnosis not present

## 2023-09-17 DIAGNOSIS — Z9181 History of falling: Secondary | ICD-10-CM | POA: Diagnosis not present

## 2023-09-17 DIAGNOSIS — R188 Other ascites: Secondary | ICD-10-CM | POA: Diagnosis not present

## 2023-09-17 DIAGNOSIS — C7800 Secondary malignant neoplasm of unspecified lung: Secondary | ICD-10-CM | POA: Diagnosis not present

## 2023-09-17 DIAGNOSIS — C189 Malignant neoplasm of colon, unspecified: Secondary | ICD-10-CM | POA: Diagnosis not present

## 2023-09-17 DIAGNOSIS — S7291XD Unspecified fracture of right femur, subsequent encounter for closed fracture with routine healing: Secondary | ICD-10-CM | POA: Diagnosis not present

## 2023-09-17 DIAGNOSIS — C786 Secondary malignant neoplasm of retroperitoneum and peritoneum: Secondary | ICD-10-CM | POA: Diagnosis not present

## 2023-09-17 DIAGNOSIS — K7682 Hepatic encephalopathy: Secondary | ICD-10-CM | POA: Diagnosis not present

## 2023-09-20 DIAGNOSIS — K922 Gastrointestinal hemorrhage, unspecified: Secondary | ICD-10-CM | POA: Diagnosis not present

## 2023-09-20 DIAGNOSIS — R188 Other ascites: Secondary | ICD-10-CM | POA: Diagnosis not present

## 2023-09-20 DIAGNOSIS — I2699 Other pulmonary embolism without acute cor pulmonale: Secondary | ICD-10-CM | POA: Diagnosis not present

## 2023-09-20 DIAGNOSIS — C189 Malignant neoplasm of colon, unspecified: Secondary | ICD-10-CM | POA: Diagnosis not present

## 2023-09-20 DIAGNOSIS — G9341 Metabolic encephalopathy: Secondary | ICD-10-CM | POA: Diagnosis not present

## 2023-09-20 DIAGNOSIS — S7291XD Unspecified fracture of right femur, subsequent encounter for closed fracture with routine healing: Secondary | ICD-10-CM | POA: Diagnosis not present

## 2023-09-20 DIAGNOSIS — C786 Secondary malignant neoplasm of retroperitoneum and peritoneum: Secondary | ICD-10-CM | POA: Diagnosis not present

## 2023-09-20 DIAGNOSIS — K7682 Hepatic encephalopathy: Secondary | ICD-10-CM | POA: Diagnosis not present

## 2023-09-20 DIAGNOSIS — Z95828 Presence of other vascular implants and grafts: Secondary | ICD-10-CM | POA: Diagnosis not present

## 2023-09-20 DIAGNOSIS — D649 Anemia, unspecified: Secondary | ICD-10-CM | POA: Diagnosis not present

## 2023-09-20 DIAGNOSIS — Z9181 History of falling: Secondary | ICD-10-CM | POA: Diagnosis not present

## 2023-09-20 DIAGNOSIS — C7800 Secondary malignant neoplasm of unspecified lung: Secondary | ICD-10-CM | POA: Diagnosis not present

## 2023-09-21 ENCOUNTER — Ambulatory Visit

## 2023-09-22 ENCOUNTER — Ambulatory Visit
Admission: RE | Admit: 2023-09-22 | Discharge: 2023-09-22 | Disposition: A | Discharge status: Home / Self Care | Source: Ambulatory Visit | Attending: Oncology | Admitting: Oncology

## 2023-09-22 DIAGNOSIS — I700 Atherosclerosis of aorta: Secondary | ICD-10-CM | POA: Insufficient documentation

## 2023-09-22 DIAGNOSIS — C189 Malignant neoplasm of colon, unspecified: Secondary | ICD-10-CM | POA: Diagnosis not present

## 2023-09-22 DIAGNOSIS — K6389 Other specified diseases of intestine: Secondary | ICD-10-CM | POA: Insufficient documentation

## 2023-09-22 DIAGNOSIS — R161 Splenomegaly, not elsewhere classified: Secondary | ICD-10-CM | POA: Diagnosis not present

## 2023-09-22 DIAGNOSIS — R7303 Prediabetes: Secondary | ICD-10-CM | POA: Diagnosis not present

## 2023-09-22 DIAGNOSIS — R188 Other ascites: Secondary | ICD-10-CM | POA: Diagnosis not present

## 2023-09-22 DIAGNOSIS — K7460 Unspecified cirrhosis of liver: Secondary | ICD-10-CM | POA: Diagnosis not present

## 2023-09-22 DIAGNOSIS — K5730 Diverticulosis of large intestine without perforation or abscess without bleeding: Secondary | ICD-10-CM | POA: Diagnosis not present

## 2023-09-22 DIAGNOSIS — R911 Solitary pulmonary nodule: Secondary | ICD-10-CM | POA: Insufficient documentation

## 2023-09-22 LAB — GLUCOSE, CAPILLARY: Glucose-Capillary: 178 mg/dL — ABNORMAL HIGH (ref 70–99)

## 2023-09-22 MED ORDER — FLUDEOXYGLUCOSE F - 18 (FDG) INJECTION
6.7200 | Freq: Once | INTRAVENOUS | Status: AC | PRN
  Administered 2023-09-22: 6.72 via INTRAVENOUS

## 2023-09-23 ENCOUNTER — Telehealth: Admitting: Oncology

## 2023-09-23 DIAGNOSIS — D649 Anemia, unspecified: Secondary | ICD-10-CM | POA: Diagnosis not present

## 2023-09-23 DIAGNOSIS — R188 Other ascites: Secondary | ICD-10-CM | POA: Diagnosis not present

## 2023-09-23 DIAGNOSIS — K7682 Hepatic encephalopathy: Secondary | ICD-10-CM | POA: Diagnosis not present

## 2023-09-23 DIAGNOSIS — Z95828 Presence of other vascular implants and grafts: Secondary | ICD-10-CM | POA: Diagnosis not present

## 2023-09-23 DIAGNOSIS — M25551 Pain in right hip: Secondary | ICD-10-CM | POA: Diagnosis not present

## 2023-09-23 DIAGNOSIS — C7800 Secondary malignant neoplasm of unspecified lung: Secondary | ICD-10-CM | POA: Diagnosis not present

## 2023-09-23 DIAGNOSIS — S7291XD Unspecified fracture of right femur, subsequent encounter for closed fracture with routine healing: Secondary | ICD-10-CM | POA: Diagnosis not present

## 2023-09-23 DIAGNOSIS — K922 Gastrointestinal hemorrhage, unspecified: Secondary | ICD-10-CM | POA: Diagnosis not present

## 2023-09-23 DIAGNOSIS — G9341 Metabolic encephalopathy: Secondary | ICD-10-CM | POA: Diagnosis not present

## 2023-09-23 DIAGNOSIS — I2699 Other pulmonary embolism without acute cor pulmonale: Secondary | ICD-10-CM | POA: Diagnosis not present

## 2023-09-23 DIAGNOSIS — C189 Malignant neoplasm of colon, unspecified: Secondary | ICD-10-CM | POA: Diagnosis not present

## 2023-09-23 DIAGNOSIS — C786 Secondary malignant neoplasm of retroperitoneum and peritoneum: Secondary | ICD-10-CM | POA: Diagnosis not present

## 2023-09-23 DIAGNOSIS — Z9181 History of falling: Secondary | ICD-10-CM | POA: Diagnosis not present

## 2023-10-04 ENCOUNTER — Encounter: Payer: Self-pay | Admitting: Oncology

## 2023-10-04 ENCOUNTER — Inpatient Hospital Stay: Attending: Oncology | Admitting: Oncology

## 2023-10-04 DIAGNOSIS — S7291XD Unspecified fracture of right femur, subsequent encounter for closed fracture with routine healing: Secondary | ICD-10-CM | POA: Diagnosis not present

## 2023-10-04 DIAGNOSIS — K922 Gastrointestinal hemorrhage, unspecified: Secondary | ICD-10-CM | POA: Diagnosis not present

## 2023-10-04 DIAGNOSIS — R600 Localized edema: Secondary | ICD-10-CM

## 2023-10-04 DIAGNOSIS — Z9181 History of falling: Secondary | ICD-10-CM | POA: Diagnosis not present

## 2023-10-04 DIAGNOSIS — C189 Malignant neoplasm of colon, unspecified: Secondary | ICD-10-CM | POA: Diagnosis not present

## 2023-10-04 DIAGNOSIS — K6389 Other specified diseases of intestine: Secondary | ICD-10-CM

## 2023-10-04 DIAGNOSIS — K7460 Unspecified cirrhosis of liver: Secondary | ICD-10-CM | POA: Diagnosis not present

## 2023-10-04 DIAGNOSIS — I2699 Other pulmonary embolism without acute cor pulmonale: Secondary | ICD-10-CM | POA: Diagnosis not present

## 2023-10-04 DIAGNOSIS — K7682 Hepatic encephalopathy: Secondary | ICD-10-CM | POA: Diagnosis not present

## 2023-10-04 DIAGNOSIS — Z95828 Presence of other vascular implants and grafts: Secondary | ICD-10-CM | POA: Diagnosis not present

## 2023-10-04 DIAGNOSIS — R188 Other ascites: Secondary | ICD-10-CM | POA: Diagnosis not present

## 2023-10-04 DIAGNOSIS — C786 Secondary malignant neoplasm of retroperitoneum and peritoneum: Secondary | ICD-10-CM | POA: Diagnosis not present

## 2023-10-04 DIAGNOSIS — Z803 Family history of malignant neoplasm of breast: Secondary | ICD-10-CM

## 2023-10-04 DIAGNOSIS — C7800 Secondary malignant neoplasm of unspecified lung: Secondary | ICD-10-CM | POA: Diagnosis not present

## 2023-10-04 DIAGNOSIS — I8500 Esophageal varices without bleeding: Secondary | ICD-10-CM | POA: Diagnosis not present

## 2023-10-04 NOTE — Assessment & Plan Note (Signed)
?    Volume overload versus venous sufficiency versus DVT.  Recommend patient to be evaluated by provider at facility.  Per daughter she has communicated the concern with facility RN and is waiting for evaluation.

## 2023-10-04 NOTE — Assessment & Plan Note (Signed)
 PET scan results reviewed and discussed with patient and her daughter [name]. 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 Option of tissue diagnosis was discussed with patient and daughter. Patient has multiple comorbidities including cognitive impairment, liver cirrhosis with ascites, CHF, depression, I do not think patient is able to tolerate any aggressive treatments. Shared decision was made to forego further biopsy for tissue diagnosis. Family is interested in proceeding with comfort care/hospice.  Referred to palliative care service

## 2023-10-04 NOTE — Progress Notes (Signed)
 HEMATOLOGY-ONCOLOGY TeleHEALTH VISIT PROGRESS NOTE  I connected with Jenna Franklin on 10/04/23  at 3:30 PM EDT by video enabled telemedicine visit and verified that I am speaking with the correct person using two identifiers. I discussed the limitations, risks, security and privacy concerns of performing an evaluation and management service by telemedicine and the availability of in-person appointments. The patient expressed understanding and agreed to proceed.   Other persons participating in the visit and their role in the encounter:  Daughter and son-in-law  Patient's location: Facility Provider's location: office Chief Complaint: Colon mass   INTERVAL HISTORY Jenna Franklin is a 83 y.o. female who has above history reviewed by me today presents for follow up visit for management of colon mass During the interval, patient has had a PET scan done. Patient presents virtually to discuss results.  She is a poor historian.  She minimally engaged in today's conversation.  History was obtained primarily from daughter. Per daughter, patient's mental status is slightly improved since discharge, but still appears to have declined comparing to prior to her surgery.  Patient has increased lower extremity edema.  Daughter has notified facility nurse, awaiting provider evaluation.  Per daughter, patient has been struggling participating in physical therapy. Daughter mentioned that she was told that patient has blood in stool.  Patient has a history of pulmonary embolism, status post IVC filter placement.  Not able to tolerate anticoagulation due to GI bleed.  Review of Systems  Unable to perform ROS: Mental status change  Constitutional:  Positive for fatigue.    Past Medical History:  Diagnosis Date   Adjustment disorder with anxious mood 10/05/2013   Anxiety    Arthritis    back   Bronchitis    currently   Calculus of kidney 03/25/2015   CHF (congestive heart failure) (HCC)    pt says  she thinks she has this   Cirrhosis of liver (HCC) 2016   Depression    Esophageal varices (HCC)    Esophageal varices (HCC)    Gastritis without bleeding 2019   GERD (gastroesophageal reflux disease)    H/O: depression    History of kidney stones    Hx of sinus tachycardia 07/06/2017   Hypertension    Nerve root pain 12/11/2011   Non-alcoholic fatty liver disease    Nonalcoholic fatty liver disease    Osteopenia    Osteopenia    Reflux    in past   Spinal stenosis    Tachycardia 03/25/2015   Tendinopathy of right gluteus medius 01/01/2017   Vertigo    3 yrs ago   Vitamin D deficiency    Vitamin D deficiency    Past Surgical History:  Procedure Laterality Date   ABDOMINAL HYSTERECTOMY     APPENDECTOMY     BREAST BIOPSY Right    neg needle bx   CATARACT EXTRACTION W/PHACO Right 01/09/2019   Procedure: CATARACT EXTRACTION PHACO AND INTRAOCULAR LENS PLACEMENT (IOC) RIGHT 8.60,        00:53.3;  Surgeon: Myrna Adine Anes, MD;  Location: [facility];  Service: Ophthalmology;  Laterality: Right;   CATARACT EXTRACTION W/PHACO Left 01/30/2019   Procedure: CATARACT EXTRACTION PHACO AND INTRAOCULAR LENS PLACEMENT (IOC) LEFT, 2.59, 00:25.5;  Surgeon: Myrna Adine Anes, MD;  Location: [facility];  Service: Ophthalmology;  Laterality: Left;   COMBINED HYSTERECTOMY ABDOMINAL W/ A&P REPAIR / OOPHORECTOMY     ESOPHAGOGASTRODUODENOSCOPY (EGD) WITH PROPOFOL  N/A 06/17/2015   Procedure: ESOPHAGOGASTRODUODENOSCOPY (EGD) WITH PROPOFOL ;  Surgeon: Rogelia Copping, MD;  Location: [facility];  Service: Endoscopy;  Laterality: N/A;   ESOPHAGOGASTRODUODENOSCOPY (EGD) WITH PROPOFOL  N/A 06/17/2017   Procedure: ESOPHAGOGASTRODUODENOSCOPY (EGD) WITH PROPOFOL ;  Surgeon: Copping Rogelia, MD;  Location: [facility];  Service: Endoscopy;  Laterality: N/A;   INTRAMEDULLARY (IM) NAIL INTERTROCHANTERIC Right 08/26/2023   Procedure: FIXATION, FRACTURE, INTERTROCHANTERIC, WITH INTRAMEDULLARY  ROD;  Surgeon: Lorelle Hussar, MD;  Location: [facility];  Service: Orthopedics;  Laterality: Right;   IVC FILTER INSERTION N/A 09/03/2023   Procedure: IVC FILTER INSERTION;  Surgeon: Jama Cordella MATSU, MD;  Location: [facility];  Service: Cardiovascular;  Laterality: N/A;   LAPAROSCOPIC CHOLECYSTECTOMY     TONSILLECTOMY      Family History  Problem Relation Age of Onset   Heart disease Mother    Heart disease Father    Heart attack Paternal Grandfather    Asthma Son 81   Hypertension Daughter    Breast cancer Maternal Aunt     Social History   Socioeconomic History   Marital status: Widowed    Spouse name: Not on file   Number of children: Not on file   Years of education: Not on file   Highest education level: Not on file  Occupational History   Not on file  Tobacco Use   Smoking status: Never   Smokeless tobacco: Never   Tobacco comments:    smoking cessation materials not required  Vaping Use   Vaping status: Never Used  Substance and Sexual Activity   Alcohol use: No    Alcohol/week: 0.0 standard drinks of alcohol   Drug use: No   Sexual activity: Yes    Birth control/protection: Post-menopausal  Other Topics Concern   Not on file  Social History Narrative   Pt lives alone   Social Drivers of Health   Financial Resource Strain: Low Risk  (08/12/2022)   Overall Financial Resource Strain (CARDIA)    Difficulty of Paying Living Expenses: Not hard at all  Food Insecurity: No Food Insecurity (08/26/2023)   Hunger Vital Sign    Worried About Running Out of Food in the Last Year: Never true    Ran Out of Food in the Last Year: Never true  Transportation Needs: No Transportation Needs (08/26/2023)   PRAPARE - Administrator, Civil Service (Medical): No    Lack of Transportation (Non-Medical): No  Physical Activity: Inactive (08/12/2022)   Exercise Vital Sign    Days of Exercise per Week: 0 days    Minutes of Exercise per Session: 0 min   Stress: No Stress Concern Present (08/12/2022)   [employer] of Occupational Health - Occupational Stress Questionnaire    Feeling of Stress : Only a little  Social Connections: Socially Isolated (08/26/2023)   Social Connection and Isolation Panel    Frequency of Communication with Friends and Family: More than three times a week    Frequency of Social Gatherings with Friends and Family: More than three times a week    Attends Religious Services: Never    Database administrator or Organizations: No    Attends Banker Meetings: Never    Marital Status: Widowed  Intimate Partner Violence: Not At Risk (08/26/2023)   Humiliation, Afraid, Rape, and Kick questionnaire    Fear of Current or Ex-Partner: No    Emotionally Abused: No    Physically Abused: No    Sexually Abused: No    Current Outpatient Medications on File Prior  to Visit  Medication Sig Dispense Refill   acetaminophen  (TYLENOL ) 325 MG tablet Take 650 mg by mouth every 6 (six) hours as needed.     furosemide  (LASIX ) 20 MG tablet Take 1 tablet (20 mg total) by mouth daily. TAKE ONE (1) TABLET BY MOUTH ONCE DAILY     HYDROcodone -acetaminophen  (NORCO/VICODIN) 5-325 MG tablet Take 1 tablet by mouth every 6 (six) hours as needed for severe pain (pain score 7-10) or moderate pain (pain score 4-6). 30 tablet 0   lactulose  (CHRONULAC ) 10 GM/15ML solution Take 30 mLs (20 g total) by mouth 2 (two) times daily.     nadolol  (CORGARD ) 20 MG tablet TAKE (1) TABLET BY MOUTH EVERY DAY 90 tablet 3   spironolactone  (ALDACTONE ) 25 MG tablet Take 1 tablet (25 mg total) by mouth daily.     No current facility-administered medications on file prior to visit.    Allergies  Allergen Reactions   Baclofen  Anxiety   Codeine Nausea And Vomiting   Sulfa Antibiotics Nausea Only   Prednisone  Anxiety       Observations/Objective: There were no vitals filed for this visit. There is no height or weight on file to calculate BMI.   Physical Exam Neurological:     Comments: Patient minimally engaged in virtual visit.  She did not interact with me.     CBC    Component Value Date/Time   WBC 4.7 09/05/2023 1235   RBC 3.06 (L) 09/05/2023 1235   HGB 8.9 (L) 09/05/2023 1235   HGB 14.3 06/10/2021 1520   HCT 27.7 (L) 09/05/2023 1235   HCT 40.2 06/10/2021 1520   PLT 131 (L) 09/05/2023 1235   PLT 93 (LL) 06/10/2021 1520   MCV 90.5 09/05/2023 1235   MCV 98 (H) 06/10/2021 1520   MCH 29.1 09/05/2023 1235   MCHC 32.1 09/05/2023 1235   RDW 17.5 (H) 09/05/2023 1235   RDW 12.2 06/10/2021 1520   LYMPHSABS 0.8 09/05/2023 1235   LYMPHSABS 1.3 06/10/2021 1520   MONOABS 0.3 09/05/2023 1235   EOSABS 0.1 09/05/2023 1235   EOSABS 0.2 06/10/2021 1520   BASOSABS 0.0 09/05/2023 1235   BASOSABS 0.0 06/10/2021 1520    CMP     Component Value Date/Time   NA 133 (L) 09/05/2023 1235   NA 141 06/10/2021 1520   K 3.8 09/05/2023 1235   CL 104 09/05/2023 1235   CO2 20 (L) 09/05/2023 1235   GLUCOSE 189 (H) 09/05/2023 1235   BUN 15 09/05/2023 1235   BUN 16 06/10/2021 1520   CREATININE 0.60 09/05/2023 1235   CALCIUM 8.4 (L) 09/05/2023 1235   PROT 6.0 (L) 09/05/2023 1235   PROT 6.8 06/10/2021 1520   ALBUMIN  2.5 (L) 09/05/2023 1235   ALBUMIN  4.5 06/10/2021 1520   AST 68 (H) 09/05/2023 1235   ALT 33 09/05/2023 1235   ALKPHOS 93 09/05/2023 1235   BILITOT 1.2 09/05/2023 1235   BILITOT 0.9 06/10/2021 1520   GFRNONAA >60 09/05/2023 1235   GFRAA >60 09/06/2019 0853     ASSESSMENT & PLAN:   Colonic mass PET scan results reviewed and discussed with patient and her daughter [name]. 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 Option of tissue diagnosis was discussed with patient and daughter. Patient has multiple comorbidities including cognitive impairment, liver cirrhosis with ascites, CHF, depression, I do not  think patient is able to tolerate any aggressive treatments. Shared decision was  made to forego further biopsy for tissue diagnosis. Family is interested in proceeding with comfort care/hospice.  Referred to palliative care service  Acute pulmonary embolism (HCC) Off anticoagulation.  Status post IVC filter.  Bilateral lower extremity edema ?  Volume overload versus venous sufficiency versus DVT.  Recommend patient to be evaluated by provider at facility.  Per daughter she has communicated the concern with facility RN and is waiting for evaluation.   Orders Placed This Encounter  Procedures   Ambulatory Referral to Palliative Care    Referral Priority:   Routine    Referral Type:   Consultation    Number of Visits Requested:   1    I discussed the assessment and treatment plan with the patient. The patient was provided an opportunity to ask questions and all were answered. The patient agreed with the plan and demonstrated an understanding of the instructions.  The patient was advised to call back or seek an in-person evaluation if the symptoms worsen or if the condition fails to improve as anticipated.   I provided 40-minutes of face-to-face video visit time during this encounter, and > 50% was spent counseling as documented under my assessment & plan.  Zelphia Cap, MD 10/04/2023 10:10 PM

## 2023-10-04 NOTE — Assessment & Plan Note (Signed)
 Off anticoagulation.  Status post IVC filter.

## 2023-10-08 ENCOUNTER — Emergency Department

## 2023-10-08 ENCOUNTER — Other Ambulatory Visit: Payer: Self-pay

## 2023-10-08 ENCOUNTER — Inpatient Hospital Stay
Admission: EM | Admit: 2023-10-08 | Discharge: 2023-10-12 | DRG: 177 | Disposition: A | Discharge status: Skilled Nursing Facility | Source: Skilled Nursing Facility | Attending: Internal Medicine | Admitting: Internal Medicine

## 2023-10-08 DIAGNOSIS — Z66 Do not resuscitate: Secondary | ICD-10-CM | POA: Diagnosis present

## 2023-10-08 DIAGNOSIS — Z961 Presence of intraocular lens: Secondary | ICD-10-CM | POA: Diagnosis not present

## 2023-10-08 DIAGNOSIS — L89312 Pressure ulcer of right buttock, stage 2: Secondary | ICD-10-CM | POA: Diagnosis present

## 2023-10-08 DIAGNOSIS — C786 Secondary malignant neoplasm of retroperitoneum and peritoneum: Secondary | ICD-10-CM | POA: Diagnosis not present

## 2023-10-08 DIAGNOSIS — R4182 Altered mental status, unspecified: Secondary | ICD-10-CM

## 2023-10-08 DIAGNOSIS — Z538 Procedure and treatment not carried out for other reasons: Secondary | ICD-10-CM | POA: Diagnosis not present

## 2023-10-08 DIAGNOSIS — R0989 Other specified symptoms and signs involving the circulatory and respiratory systems: Secondary | ICD-10-CM | POA: Diagnosis not present

## 2023-10-08 DIAGNOSIS — Z743 Need for continuous supervision: Secondary | ICD-10-CM | POA: Diagnosis not present

## 2023-10-08 DIAGNOSIS — Z7189 Other specified counseling: Secondary | ICD-10-CM | POA: Diagnosis not present

## 2023-10-08 DIAGNOSIS — D649 Anemia, unspecified: Secondary | ICD-10-CM | POA: Diagnosis present

## 2023-10-08 DIAGNOSIS — R188 Other ascites: Secondary | ICD-10-CM | POA: Diagnosis present

## 2023-10-08 DIAGNOSIS — Z7401 Bed confinement status: Secondary | ICD-10-CM | POA: Diagnosis not present

## 2023-10-08 DIAGNOSIS — K7460 Unspecified cirrhosis of liver: Secondary | ICD-10-CM | POA: Diagnosis present

## 2023-10-08 DIAGNOSIS — R7989 Other specified abnormal findings of blood chemistry: Secondary | ICD-10-CM | POA: Diagnosis not present

## 2023-10-08 DIAGNOSIS — Z515 Encounter for palliative care: Secondary | ICD-10-CM

## 2023-10-08 DIAGNOSIS — R0902 Hypoxemia: Secondary | ICD-10-CM | POA: Diagnosis not present

## 2023-10-08 DIAGNOSIS — D630 Anemia in neoplastic disease: Secondary | ICD-10-CM | POA: Diagnosis present

## 2023-10-08 DIAGNOSIS — L89322 Pressure ulcer of left buttock, stage 2: Secondary | ICD-10-CM | POA: Diagnosis not present

## 2023-10-08 DIAGNOSIS — I452 Bifascicular block: Secondary | ICD-10-CM | POA: Diagnosis not present

## 2023-10-08 DIAGNOSIS — G9341 Metabolic encephalopathy: Secondary | ICD-10-CM | POA: Diagnosis present

## 2023-10-08 DIAGNOSIS — M8580 Other specified disorders of bone density and structure, unspecified site: Secondary | ICD-10-CM | POA: Diagnosis present

## 2023-10-08 DIAGNOSIS — Z885 Allergy status to narcotic agent status: Secondary | ICD-10-CM

## 2023-10-08 DIAGNOSIS — Z90710 Acquired absence of both cervix and uterus: Secondary | ICD-10-CM | POA: Diagnosis not present

## 2023-10-08 DIAGNOSIS — E43 Unspecified severe protein-calorie malnutrition: Secondary | ICD-10-CM

## 2023-10-08 DIAGNOSIS — U071 COVID-19: Secondary | ICD-10-CM | POA: Diagnosis present

## 2023-10-08 DIAGNOSIS — I5032 Chronic diastolic (congestive) heart failure: Secondary | ICD-10-CM | POA: Diagnosis present

## 2023-10-08 DIAGNOSIS — R000 Tachycardia, unspecified: Secondary | ICD-10-CM | POA: Diagnosis not present

## 2023-10-08 DIAGNOSIS — I959 Hypotension, unspecified: Secondary | ICD-10-CM | POA: Diagnosis not present

## 2023-10-08 DIAGNOSIS — E872 Acidosis, unspecified: Secondary | ICD-10-CM | POA: Diagnosis present

## 2023-10-08 DIAGNOSIS — I2699 Other pulmonary embolism without acute cor pulmonale: Secondary | ICD-10-CM | POA: Diagnosis present

## 2023-10-08 DIAGNOSIS — I10 Essential (primary) hypertension: Secondary | ICD-10-CM | POA: Diagnosis not present

## 2023-10-08 DIAGNOSIS — E876 Hypokalemia: Secondary | ICD-10-CM | POA: Diagnosis present

## 2023-10-08 DIAGNOSIS — G9389 Other specified disorders of brain: Secondary | ICD-10-CM | POA: Diagnosis not present

## 2023-10-08 DIAGNOSIS — Z95828 Presence of other vascular implants and grafts: Secondary | ICD-10-CM

## 2023-10-08 DIAGNOSIS — Z888 Allergy status to other drugs, medicaments and biological substances status: Secondary | ICD-10-CM

## 2023-10-08 DIAGNOSIS — I110 Hypertensive heart disease with heart failure: Secondary | ICD-10-CM | POA: Diagnosis not present

## 2023-10-08 DIAGNOSIS — C189 Malignant neoplasm of colon, unspecified: Secondary | ICD-10-CM | POA: Diagnosis present

## 2023-10-08 DIAGNOSIS — Z9841 Cataract extraction status, right eye: Secondary | ICD-10-CM

## 2023-10-08 DIAGNOSIS — Z9842 Cataract extraction status, left eye: Secondary | ICD-10-CM

## 2023-10-08 DIAGNOSIS — Z8249 Family history of ischemic heart disease and other diseases of the circulatory system: Secondary | ICD-10-CM

## 2023-10-08 DIAGNOSIS — R404 Transient alteration of awareness: Secondary | ICD-10-CM | POA: Diagnosis not present

## 2023-10-08 DIAGNOSIS — Z882 Allergy status to sulfonamides status: Secondary | ICD-10-CM

## 2023-10-08 DIAGNOSIS — Z87442 Personal history of urinary calculi: Secondary | ICD-10-CM

## 2023-10-08 DIAGNOSIS — R9389 Abnormal findings on diagnostic imaging of other specified body structures: Secondary | ICD-10-CM | POA: Diagnosis not present

## 2023-10-08 DIAGNOSIS — R410 Disorientation, unspecified: Secondary | ICD-10-CM | POA: Diagnosis not present

## 2023-10-08 DIAGNOSIS — Z789 Other specified health status: Secondary | ICD-10-CM | POA: Diagnosis not present

## 2023-10-08 DIAGNOSIS — R0602 Shortness of breath: Secondary | ICD-10-CM | POA: Diagnosis not present

## 2023-10-08 DIAGNOSIS — K760 Fatty (change of) liver, not elsewhere classified: Secondary | ICD-10-CM | POA: Diagnosis not present

## 2023-10-08 DIAGNOSIS — Z79899 Other long term (current) drug therapy: Secondary | ICD-10-CM

## 2023-10-08 DIAGNOSIS — Z9049 Acquired absence of other specified parts of digestive tract: Secondary | ICD-10-CM

## 2023-10-08 DIAGNOSIS — R600 Localized edema: Secondary | ICD-10-CM | POA: Diagnosis not present

## 2023-10-08 LAB — APTT: aPTT: 32 s (ref 24–36)

## 2023-10-08 LAB — LACTATE DEHYDROGENASE: LDH: 223 U/L — ABNORMAL HIGH (ref 98–192)

## 2023-10-08 LAB — PROTIME-INR
INR: 1.2 (ref 0.8–1.2)
Prothrombin Time: 16.1 s — ABNORMAL HIGH (ref 11.4–15.2)

## 2023-10-08 LAB — BASIC METABOLIC PANEL WITH GFR
Anion gap: 12 (ref 5–15)
BUN: 13 mg/dL (ref 8–23)
CO2: 24 mmol/L (ref 22–32)
Calcium: 7.8 mg/dL — ABNORMAL LOW (ref 8.9–10.3)
Chloride: 94 mmol/L — ABNORMAL LOW (ref 98–111)
Creatinine, Ser: 0.69 mg/dL (ref 0.44–1.00)
GFR, Estimated: >60 mL/min (ref >60)
Glucose, Bld: 178 mg/dL — ABNORMAL HIGH (ref 70–99)
Potassium: 3 mmol/L — ABNORMAL LOW (ref 3.5–5.1)
Sodium: 130 mmol/L — ABNORMAL LOW (ref 135–145)

## 2023-10-08 LAB — CBC
HCT: 25.2 % — ABNORMAL LOW (ref 36.0–46.0)
Hemoglobin: 8 g/dL — ABNORMAL LOW (ref 12.0–15.0)
MCH: 28.2 pg (ref 26.0–34.0)
MCHC: 31.7 g/dL (ref 30.0–36.0)
MCV: 88.7 fL (ref 80.0–100.0)
Platelets: 160 K/uL (ref 150–400)
RBC: 2.84 MIL/uL — ABNORMAL LOW (ref 3.87–5.11)
RDW: 16.7 % — ABNORMAL HIGH (ref 11.5–15.5)
WBC: 7.1 K/uL (ref 4.0–10.5)
nRBC: 0 % (ref 0.0–0.2)

## 2023-10-08 LAB — URINALYSIS, ROUTINE W REFLEX MICROSCOPIC
Bilirubin Urine: NEGATIVE
Glucose, UA: NEGATIVE mg/dL
Hgb urine dipstick: NEGATIVE
Ketones, ur: NEGATIVE mg/dL
Leukocytes,Ua: NEGATIVE
Nitrite: NEGATIVE
Protein, ur: NEGATIVE mg/dL
Specific Gravity, Urine: 1.009 (ref 1.005–1.030)
pH: 6 (ref 5.0–8.0)

## 2023-10-08 LAB — AMMONIA: Ammonia: 15 umol/L (ref 9–35)

## 2023-10-08 LAB — TROPONIN I (HIGH SENSITIVITY)
Troponin I (High Sensitivity): 8 ng/L (ref <18)
Troponin I (High Sensitivity): 8 ng/L (ref <18)

## 2023-10-08 LAB — LACTIC ACID, PLASMA
Lactic Acid, Venous: 2.2 mmol/L (ref 0.5–1.9)
Lactic Acid, Venous: 1.5 mmol/L (ref 0.5–1.9)

## 2023-10-08 LAB — MAGNESIUM: Magnesium: 1.8 mg/dL (ref 1.7–2.4)

## 2023-10-08 LAB — BRAIN NATRIURETIC PEPTIDE: B Natriuretic Peptide: 58.7 pg/mL (ref 0.0–100.0)

## 2023-10-08 LAB — PHOSPHORUS: Phosphorus: 2.3 mg/dL — ABNORMAL LOW (ref 2.5–4.6)

## 2023-10-08 MED ORDER — SODIUM CHLORIDE 0.9 % IV BOLUS
1000.0000 mL | Freq: Once | INTRAVENOUS | Status: AC
  Administered 2023-10-08: 1000 mL via INTRAVENOUS

## 2023-10-08 MED ORDER — FUROSEMIDE 20 MG PO TABS
20.0000 mg | ORAL_TABLET | Freq: Every day | ORAL | Status: DC
  Administered 2023-10-09 – 2023-10-12 (×3): 20 mg via ORAL
  Filled 2023-10-08 (×4): qty 1

## 2023-10-08 MED ORDER — NADOLOL 20 MG PO TABS
20.0000 mg | ORAL_TABLET | Freq: Every day | ORAL | Status: DC
Start: 2023-10-09 — End: 2023-10-12
  Administered 2023-10-09 – 2023-10-10 (×2): 20 mg via ORAL
  Filled 2023-10-08 (×4): qty 1

## 2023-10-08 MED ORDER — DM-GUAIFENESIN ER 30-600 MG PO TB12: 1.0000 | ORAL_TABLET | Freq: Two times a day (BID) | ORAL | Status: DC | PRN

## 2023-10-08 MED ORDER — ENSURE PLUS HIGH PROTEIN PO LIQD
237.0000 mL | Freq: Two times a day (BID) | ORAL | Status: DC
  Administered 2023-10-09 – 2023-10-12 (×5): 237 mL via ORAL

## 2023-10-08 MED ORDER — ALBUTEROL SULFATE (2.5 MG/3ML) 0.083% IN NEBU: 2.5000 mg | INHALATION_SOLUTION | RESPIRATORY_TRACT | Status: DC | PRN

## 2023-10-08 MED ORDER — ONDANSETRON HCL 4 MG/2ML IJ SOLN
4.0000 mg | Freq: Once | INTRAMUSCULAR | Status: AC
  Administered 2023-10-08: 4 mg via INTRAVENOUS
  Filled 2023-10-08: qty 2

## 2023-10-08 MED ORDER — HYDRALAZINE HCL 20 MG/ML IJ SOLN: 5.0000 mg | INTRAMUSCULAR | Status: DC | PRN

## 2023-10-08 MED ORDER — POTASSIUM CHLORIDE 20 MEQ PO PACK
60.0000 meq | PACK | Freq: Once | ORAL | Status: AC
  Administered 2023-10-08: 60 meq via ORAL
  Filled 2023-10-08: qty 3

## 2023-10-08 MED ORDER — ONDANSETRON HCL 4 MG/2ML IJ SOLN: 4.0000 mg | Freq: Three times a day (TID) | INTRAMUSCULAR | Status: DC | PRN

## 2023-10-08 MED ORDER — LACTULOSE 10 GM/15ML PO SOLN
20.0000 g | Freq: Two times a day (BID) | ORAL | Status: DC
  Administered 2023-10-09 – 2023-10-10 (×4): 20 g via ORAL
  Filled 2023-10-08 (×4): qty 30

## 2023-10-08 MED ORDER — SPIRONOLACTONE 25 MG PO TABS
25.0000 mg | ORAL_TABLET | Freq: Every day | ORAL | Status: DC
  Administered 2023-10-09 – 2023-10-10 (×2): 25 mg via ORAL
  Filled 2023-10-08 (×2): qty 1

## 2023-10-08 MED ORDER — ALBUMIN HUMAN 25 % IV SOLN
25.0000 g | Freq: Once | INTRAVENOUS | Status: AC
  Administered 2023-10-09: 25 g via INTRAVENOUS
  Filled 2023-10-08: qty 100

## 2023-10-08 MED ORDER — ACETAMINOPHEN 325 MG PO TABS: 325.0000 mg | ORAL_TABLET | Freq: Four times a day (QID) | ORAL | Status: DC | PRN

## 2023-10-08 MED ORDER — HYDROCODONE-ACETAMINOPHEN 5-325 MG PO TABS: 1.0000 | ORAL_TABLET | Freq: Four times a day (QID) | ORAL | Status: DC | PRN

## 2023-10-08 MED ORDER — POTASSIUM PHOSPHATES 15 MMOLE/5ML IV SOLN
15.0000 mmol | Freq: Once | INTRAVENOUS | Status: AC
  Administered 2023-10-09: 15 mmol via INTRAVENOUS
  Filled 2023-10-08: qty 5

## 2023-10-08 NOTE — ED Provider Notes (Signed)
 [facility] Provider Note    Event Date/Time   First MD Initiated Contact with Patient 10/08/23 1557     (approximate)   History   AMS and COVID+   HPI  Layana Konkel is a 83 y.o. female with a history of possible stage IV colon cancer, recently diagnosed, and PE status post IVC filter who presents due to weakness and altered mental status.  Per EMS the patient was diagnosed with COVID and has been altered.  The patient herself reports feeling unwell.  When asked about specific symptoms, she reports generalized pain, neck pain, difficulty breathing, nausea, diarrhea, and weakness.  I reviewed the past medical records.  The patient was seen by Dr. Babara from oncology on 8/18 for evaluation of the colon mass.  Per the note, family is interested in proceeding with comfort care/hospice.  Referred to palliative care service.   Physical Exam   Triage Vital Signs: ED Triage Vitals  Encounter Vitals Group     BP 10/08/23 1313 (!) 98/51     Girls Systolic BP Percentile --      Girls Diastolic BP Percentile --      Boys Systolic BP Percentile --      Boys Diastolic BP Percentile --      Pulse Rate 10/08/23 1313 78     Resp 10/08/23 1313 20     Temp 10/08/23 1313 98.1 F (36.7 C)     Temp Source 10/08/23 1313 Oral     SpO2 10/08/23 1313 97 %     Weight --      Height --      Head Circumference --      Peak Flow --      Pain Score 10/08/23 1255 0     Pain Loc --      Pain Education --      Exclude from Growth Chart --     Most recent vital signs: Vitals:   10/08/23 2125 10/08/23 2206  BP:  (!) 103/51  Pulse:  85  Resp:  17  Temp: 98.8 F (37.1 C) 98.9 F (37.2 C)  SpO2:  97%     General: Alert, oriented x 2, very weak appearing, no distress.  CV:  Good peripheral perfusion.  Resp:  Normal effort.  Coarse breath sounds bilaterally. Abd:  Soft and nontender.  No distention.  Other:  Dry mucous membranes.  EOMI.  PERRLA.  Motor intact in  all extremities.  Normal speech.   ED Results / Procedures / Treatments   Labs (all labs ordered are listed, but only abnormal results are displayed) Labs Reviewed  CBC - Abnormal; Notable for the following components:      Result Value   RBC 2.84 (*)    Hemoglobin 8.0 (*)    HCT 25.2 (*)    RDW 16.7 (*)    All other components within normal limits  BASIC METABOLIC PANEL WITH GFR - Abnormal; Notable for the following components:   Sodium 130 (*)    Potassium 3.0 (*)    Chloride 94 (*)    Glucose, Bld 178 (*)    Calcium 7.8 (*)    All other components within normal limits  LACTIC ACID, PLASMA - Abnormal; Notable for the following components:   Lactic Acid, Venous 2.2 (*)    All other components within normal limits  URINALYSIS, ROUTINE W REFLEX MICROSCOPIC - Abnormal; Notable for the following components:   Color, Urine YELLOW (*)  APPearance CLEAR (*)    All other components within normal limits  PROTIME-INR - Abnormal; Notable for the following components:   Prothrombin Time 16.1 (*)    All other components within normal limits  PHOSPHORUS - Abnormal; Notable for the following components:   Phosphorus 2.3 (*)    All other components within normal limits  LACTATE DEHYDROGENASE - Abnormal; Notable for the following components:   LDH 223 (*)    All other components within normal limits  BRAIN NATRIURETIC PEPTIDE  LACTIC ACID, PLASMA  AMMONIA  APTT  MAGNESIUM  COMPREHENSIVE METABOLIC PANEL WITH GFR  CBC  TROPONIN I (HIGH SENSITIVITY)  TROPONIN I (HIGH SENSITIVITY)     EKG  ED ECG REPORT I, Waylon Cassis, the attending physician, personally viewed and interpreted this ECG.  Date: 10/08/2023 EKG Time: 1310 Rate: 77 Rhythm: normal sinus rhythm QRS Axis: normal Intervals: Incomplete RBBB ST/T Wave abnormalities: Nonspecific ST abnormality Narrative Interpretation: no evidence of acute ischemia    RADIOLOGY  Chest x-ray: I independently viewed and  interpreted the images; there is no focal consolidation or edema   PROCEDURES:  Critical Care performed: No  Procedures   MEDICATIONS ORDERED IN ED: Medications  ondansetron  (ZOFRAN ) injection 4 mg (has no administration in time range)  hydrALAZINE  (APRESOLINE ) injection 5 mg (has no administration in time range)  acetaminophen  (TYLENOL ) tablet 325 mg (has no administration in time range)  albuterol  (PROVENTIL ) (2.5 MG/3ML) 0.083% nebulizer solution 2.5 mg (has no administration in time range)  dextromethorphan-guaiFENesin  (MUCINEX  DM) 30-600 MG per 12 hr tablet 1 tablet (has no administration in time range)  feeding supplement (ENSURE PLUS HIGH PROTEIN) liquid 237 mL (has no administration in time range)  albumin  human 25 % solution 25 g (has no administration in time range)  HYDROcodone -acetaminophen  (NORCO/VICODIN) 5-325 MG per tablet 1 tablet (has no administration in time range)  furosemide  (LASIX ) tablet 20 mg (has no administration in time range)  nadolol  (CORGARD ) tablet 20 mg (has no administration in time range)  spironolactone  (ALDACTONE ) tablet 25 mg (has no administration in time range)  lactulose  (CHRONULAC ) 10 GM/15ML solution 20 g (has no administration in time range)  potassium PHOSPHATE 15 mmol in dextrose  5 % 250 mL infusion (has no administration in time range)  sodium chloride  0.9 % bolus 1,000 mL (0 mLs Intravenous Stopped 10/08/23 2101)  ondansetron  (ZOFRAN ) injection 4 mg (4 mg Intravenous Given 10/08/23 1838)  potassium chloride  (KLOR-CON ) packet 60 mEq (60 mEq Oral Given 10/08/23 2306)     IMPRESSION / MDM / ASSESSMENT AND PLAN / ED COURSE  I reviewed the triage vital signs and the nursing notes.  83 year old female with PMH as noted above presents with generalized weakness, altered mental status, and multiple symptoms after being diagnosed with COVID.  On exam her blood pressure is borderline low.  Other vital signs are normal.  She is alert but somewhat  confused, oriented x 2.  Differential diagnosis includes, but is not limited to, metabolic encephalopathy related to COVID-19, dehydration, electrolyte abnormality, other metabolic cause, UTI or other infection, less likely CNS or cardiac cause.  Chest x-ray shows no acute findings.  CBC shows chronic anemia.  BMP is unremarkable for acute findings.  I have added on cardiac enzymes, BNP, lactate, and CT head.  We will give a fluid bolus and reassess.  Patient's presentation is most consistent with acute presentation with potential threat to life or bodily function.  The patient is on the cardiac monitor to evaluate for  evidence of arrhythmia and/or significant heart rate changes.   ----------------------------------------- 8:13 PM on 10/08/2023 -----------------------------------------  Lab workup is overall unremarkable.  BMP shows no acute findings.  CBC shows no leukocytosis and shows chronic anemia.  Troponin is negative.  Lactate is normal.  Urinalysis is not consistent with UTI.  I had an extensive discussion with the daughter who advises that she is unable to come in because she is also positive for COVID.  She also states that the patient was recently diagnosed with moderate ascites and there was a discussion about whether she may benefit from paracentesis for shortness of breath.  We had a discussion about goals of care.  She would like palliative medicine to be consulted and ultimately for the patient to go on hospice, but to proceed with palliative treatment such as paracentesis that could help with her symptoms.  I consulted Dr. Hilma from the hospitalist service; based on our discussion he agrees to evaluate the patient for admission.  FINAL CLINICAL IMPRESSION(S) / ED DIAGNOSES   Final diagnoses:  COVID-19  Altered mental status, unspecified altered mental status type     Rx / DC Orders   ED Discharge Orders     None        Note:  This document was prepared using  Dragon voice recognition software and may include unintentional dictation errors.    Jacolyn Pae, MD 10/08/23 2316

## 2023-10-08 NOTE — ED Notes (Signed)
 Went to do bloodwork and IV, pt in CT at this time.

## 2023-10-08 NOTE — H&P (Addendum)
 History and Physical    Jenna Franklin:[id number] DOB: 11-30-1940 DOA: 10/08/2023  Referring MD/NP/PA:   PCP: Jenna Leita DEL, MD   Patient coming from:  The patient is coming from SNF   Chief Complaint: Altered mental status and COVID 19 infection  HPI: Jenna Franklin is a 83 y.o. female with medical history significant of newly diagnosed stage IV colon with peritoneal carcinomatosis, liver cirrhosis with ascites, esophageal varices, GI bleeding, anemia, HTN, prediabetes, dCHF, spinal stenosis, kidney stone, recently diagnosed PE (s/p IVC, not candidate for anticoagulants due to high risk of bleeding), who presents with AMS and COVID infection.  Patient has AMS,  and is unable to provide medical history.  I called her daughter by phone, who provided medical history.  Patient was recently hospitalized from 7/10 - 7/21 due to right femoral fracture.  Patient underwent successful surgery.  During that admission patient was found to have acute PE. Pt is not a good candidate for anticoagulant use due to high risk of rebleeding. Pt is s/p of IVC filter placement.   Per her daughter, before recent admission, patient has been doing well. Pt was alert and oriented x 3.  Ever since recent admission, patient has been declining.  In the past several days, patient become more confused.  She was noted to have SOB and was tested positive for COVID in facility today.  Her daughter was also tested positive for COVID today.  Patient does not have active cough, no chest pain, fever or chills.  Patient has loose stool due to lactulose  use, no nausea, vomiting or abdominal pain.  No symptoms of UTI per her daughter.  Patient moves all extremities.  Patient has bilateral leg edema. Pt has worsening abdominal distention.  Data reviewed independently and ED Course: pt was found to have WBC 7.1, troponin 8 --> 8, lactic acid 2.2, GFR> 60, potassium 3.0, BNP 53.7, temperature normal, blood pressure 94/71,  heart rate 87, RR 25 --> 19, oxygen 7097% on room air.  Chest x-ray negative.  CT of head negative for acute intracranial abnormalities.  Patient is admitted to telemetry bed as inpatient.   EKG: I have personally reviewed.  Sinus rhythm, QTc 497, low voltage, early R wave progression, bifascicular block.   Review of Systems: Could not be accurately reviewed due to altered mental status.    Allergy:  Allergies  Allergen Reactions   Baclofen  Anxiety   Codeine Nausea And Vomiting   Sulfa Antibiotics Nausea Only   Prednisone  Anxiety    Past Medical History:  Diagnosis Date   Adjustment disorder with anxious mood 10/05/2013   Anxiety    Arthritis    back   Bronchitis    currently   Calculus of kidney 03/25/2015   CHF (congestive heart failure) (HCC)    pt says she thinks she has this   Cirrhosis of liver (HCC) 2016   Depression    Esophageal varices (HCC)    Esophageal varices (HCC)    Gastritis without bleeding 2019   GERD (gastroesophageal reflux disease)    H/O: depression    History of kidney stones    Hx of sinus tachycardia 07/06/2017   Hypertension    Nerve root pain 12/11/2011   Non-alcoholic fatty liver disease    Nonalcoholic fatty liver disease    Osteopenia    Osteopenia    Reflux    in past   Spinal stenosis    Tachycardia 03/25/2015   Tendinopathy of right gluteus medius  01/01/2017   Vertigo    3 yrs ago   Vitamin D deficiency    Vitamin D deficiency     Past Surgical History:  Procedure Laterality Date   ABDOMINAL HYSTERECTOMY     APPENDECTOMY     BREAST BIOPSY Right    neg needle bx   CATARACT EXTRACTION W/PHACO Right 01/09/2019   Procedure: CATARACT EXTRACTION PHACO AND INTRAOCULAR LENS PLACEMENT (IOC) RIGHT 8.60,        00:53.3;  Surgeon: Jenna Adine Anes, MD;  Location: [facility];  Service: Ophthalmology;  Laterality: Right;   CATARACT EXTRACTION W/PHACO Left 01/30/2019   Procedure: CATARACT EXTRACTION PHACO AND INTRAOCULAR LENS  PLACEMENT (IOC) LEFT, 2.59, 00:25.5;  Surgeon: Jenna Adine Anes, MD;  Location: [facility];  Service: Ophthalmology;  Laterality: Left;   COMBINED HYSTERECTOMY ABDOMINAL W/ A&P REPAIR / OOPHORECTOMY     ESOPHAGOGASTRODUODENOSCOPY (EGD) WITH PROPOFOL  N/A 06/17/2015   Procedure: ESOPHAGOGASTRODUODENOSCOPY (EGD) WITH PROPOFOL ;  Surgeon: Jenna Copping, MD;  Location: [facility];  Service: Endoscopy;  Laterality: N/A;   ESOPHAGOGASTRODUODENOSCOPY (EGD) WITH PROPOFOL  N/A 06/17/2017   Procedure: ESOPHAGOGASTRODUODENOSCOPY (EGD) WITH PROPOFOL ;  Surgeon: Franklin Rogelia, MD;  Location: [facility];  Service: Endoscopy;  Laterality: N/A;   INTRAMEDULLARY (IM) NAIL INTERTROCHANTERIC Right 08/26/2023   Procedure: FIXATION, FRACTURE, INTERTROCHANTERIC, WITH INTRAMEDULLARY ROD;  Surgeon: Jenna Hussar, MD;  Location: [facility];  Service: Orthopedics;  Laterality: Right;   IVC FILTER INSERTION N/A 09/03/2023   Procedure: IVC FILTER INSERTION;  Surgeon: Jenna Cordella MATSU, MD;  Location: [facility];  Service: Cardiovascular;  Laterality: N/A;   LAPAROSCOPIC CHOLECYSTECTOMY     TONSILLECTOMY      Social History:  reports that she has never smoked. She has never used smokeless tobacco. She reports that she does not drink alcohol and does not use drugs.  Family History:  Family History  Problem Relation Age of Onset   Heart disease Mother    Heart disease Father    Heart attack Paternal Grandfather    Asthma Son 72   Hypertension Daughter    Breast cancer Maternal Aunt      Prior to Admission medications   Medication Sig Start Date End Date Taking? Authorizing Provider  acetaminophen  (TYLENOL ) 325 MG tablet Take 650 mg by mouth every 6 (six) hours as needed.    [provider]  furosemide  (LASIX ) 20 MG tablet Take 1 tablet (20 mg total) by mouth daily. TAKE ONE (1) TABLET BY MOUTH ONCE DAILY 09/06/23   Jenna Durand, MD  HYDROcodone -acetaminophen  (NORCO/VICODIN)  5-325 MG tablet Take 1 tablet by mouth every 6 (six) hours as needed for severe pain (pain score 7-10) or moderate pain (pain score 4-6). 09/02/23   Charlene Debby BROCKS, PA-C  lactulose  (CHRONULAC ) 10 GM/15ML solution Take 30 mLs (20 g total) by mouth 2 (two) times daily. 09/06/23   Jenna Durand, MD  nadolol  (CORGARD ) 20 MG tablet TAKE (1) TABLET BY MOUTH EVERY DAY 06/30/22   Franklin Rogelia, MD  spironolactone  (ALDACTONE ) 25 MG tablet Take 1 tablet (25 mg total) by mouth daily. 09/07/23   Jenna Durand, MD    Physical Exam: Vitals:   10/08/23 2100 10/08/23 2125 10/08/23 2158 10/08/23 2206  BP:    (!) 103/51  Pulse: 88   85  Resp: 19   17  Temp:  98.8 F (37.1 C)  98.9 F (37.2 C)  TempSrc:  Oral    SpO2: 93%   97%  Weight:  61.5 kg    General: Not in acute distress. Has anasarca HEENT:        Eyes: PERRL, EOMI, no jaundice       ENT: No discharge from the ears and nose, no pharynx injection, no tonsillar enlargement.        Neck: No JVD, no bruit, no mass felt. Heme: No neck lymph node enlargement. Cardiac: S1/S2, RRR, No murmurs, No gallops or rubs. Respiratory: No rales, wheezing, rhonchi or rubs. GI: Has moderate abdominal distention, does not seem to have tenderness, BS present. GU: No hematuria Ext: 3+ pitting leg edema bilaterally. 1+DP/PT pulse bilaterally. Musculoskeletal: No joint deformities, No joint redness or warmth, no limitation of ROM in spin. Skin: No rashes.  Neuro: Confused, knows her only him, not orientated to place and time, Cranial nerves II-XII grossly intact, moves all extremities.  Psych: Patient is not psychotic, no suicidal or hemocidal ideation.  Labs on Admission: I have personally reviewed following labs and imaging studies  CBC: Recent Labs  Lab 10/08/23 1257  WBC 7.1  HGB 8.0*  HCT 25.2*  MCV 88.7  PLT 160   Basic Metabolic Panel: Recent Labs  Lab 10/08/23 1257 10/08/23 2121  NA 130*  --   K 3.0*  --   CL 94*  --   CO2 24  --    GLUCOSE 178*  --   BUN 13  --   CREATININE 0.69  --   CALCIUM 7.8*  --   MG  --  1.8  PHOS  --  2.3*   GFR: Estimated Creatinine Clearance: 44.1 mL/min (by C-G formula based on SCr of 0.69 mg/dL). Liver Function Tests: No results for input(s): AST, ALT, ALKPHOS, BILITOT, PROT, ALBUMIN  in the last 168 hours. No results for input(s): LIPASE, AMYLASE in the last 168 hours. Recent Labs  Lab 10/08/23 2121  AMMONIA 15   Coagulation Profile: Recent Labs  Lab 10/08/23 2121  INR 1.2   Cardiac Enzymes: No results for input(s): CKTOTAL, CKMB, CKMBINDEX, TROPONINI in the last 168 hours. BNP (last 3 results) No results for input(s): PROBNP in the last 8760 hours. HbA1C: No results for input(s): HGBA1C in the last 72 hours. CBG: No results for input(s): GLUCAP in the last 168 hours. Lipid Profile: No results for input(s): CHOL, HDL, LDLCALC, TRIG, CHOLHDL, LDLDIRECT in the last 72 hours. Thyroid  Function Tests: No results for input(s): TSH, T4TOTAL, FREET4, T3FREE, THYROIDAB in the last 72 hours. Anemia Panel: No results for input(s): VITAMINB12, FOLATE, FERRITIN, TIBC, IRON, RETICCTPCT in the last 72 hours. Urine analysis:    Component Value Date/Time   COLORURINE YELLOW (A) 10/08/2023 1659   APPEARANCEUR CLEAR (A) 10/08/2023 1659   LABSPEC 1.009 10/08/2023 1659   PHURINE 6.0 10/08/2023 1659   GLUCOSEU NEGATIVE 10/08/2023 1659   HGBUR NEGATIVE 10/08/2023 1659   BILIRUBINUR NEGATIVE 10/08/2023 1659   BILIRUBINUR neg 09/06/2019 0833   KETONESUR NEGATIVE 10/08/2023 1659   PROTEINUR NEGATIVE 10/08/2023 1659   UROBILINOGEN 0.2 09/06/2019 0833   NITRITE NEGATIVE 10/08/2023 1659   LEUKOCYTESUR NEGATIVE 10/08/2023 1659   Sepsis Labs: @LABRCNTIP (procalcitonin:4,lacticidven:4) )No results found for this or any previous visit (from the past 240 hours).   Radiological Exams on Admission:   Assessment/Plan Principal  Problem:   COVID-19 virus infection Active Problems:   Acute metabolic encephalopathy   Cirrhosis of liver with ascites (HCC)   Chronic diastolic CHF (congestive heart failure) (HCC)   HTN (hypertension)   Normocytic anemia   Pulmonary embolism (HCC)  Hypokalemia   Hypophosphatemia   Elevated lactic acid level   Colon cancer with peritoneal carcinomatosis   Assessment and Plan:   COVID-19 virus infection: Daughter reported that the patient is tested positive for COVID 19 today.  Patient has mild SOB, no cough, fever or chills.  Chest x-ray negative for pneumonia.  No respiratory distress.  -Admit to telemetry bed as inpatient - Supportive care - As needed albuterol , Mucinex  - As needed nasal cannula oxygen  Acute metabolic encephalopathy: Etiology is not clear. CT head negative.  Ammonia level normal 15.  Possibly due to COVID-19 infection. - Fall precaution - Frequent neurocheck  Cirrhosis of liver with ascites [facility]): Patient has moderate ascites.  No abdominal pain.  No fever or chills.  No leukocytosis.  Low suspicions for SBP.  Ammonia level normal 15. -Continue home lactulose  20 g twice daily -Ordered paracentesis to be done by IR  - Follow-up fluid analysis - Will give 25 g of albumin  before paracentesis - Check INR and PTT --> then calculate MELD score  Chronic diastolic CHF (congestive heart failure) (HCC): 2D echo on 08/30/2023 showed EF 55 to 60%.  Patient has anasarca, which is likely due to liver cirrhosis.  BNP normal 58.7, does not seem to have CHF exacerbation. -Continue Lasix  and spironolactone   HTN (hypertension) -Nadolol  - IV hydralazine  as needed  Normocytic anemia: Hemoglobin 8.8 (8.9 on 09/05/2023).  No active bleeding. - Follow-up with CBC  Pulmonary embolism (HCC) -S/p of IVC filter placement  Hypokalemia and hypophosphatemia: Potassium 3.0 and phosphorus 2.3.  Magnesium normal 1.8. -Repleted potassium and phosphorus.  Elevated lactic acid  level: Lactic acid 2.2.  Patient does not have fever or leukocytosis, clinically no sepsis.  This likely due to decreased clearance secondary to liver cirrhosis. -Patient received 1 L normal saline in ED, will not give more fluid - Trend lactic acid level  Colon cancer with peritoneal carcinomatosis: Patient was seen by Dr. Babara of oncology recently, had PET scan, consistent with stage IV colon cancer with peritoneal metastasis.  Patient is not a good candidate for aggressive treatment. -Palliative consult for hospice care     DVT ppx: SCD  Code Status: DNR per her daughter  Family Communication:  Yes, patient's daughter by phone     Disposition Plan:  Anticipate discharge back to previous environment  Consults called: None  Admission status and Level of care: Telemetry Medical:  as inpt        Dispo: The patient is from: SNF              Anticipated d/c is to: SNF              Anticipated d/c date is: 2 days              Patient currently is not medically stable to d/c.    Severity of Illness:  The appropriate patient status for this patient is INPATIENT. Inpatient status is judged to be reasonable and necessary in order to provide the required intensity of service to ensure the patient's safety. The patient's presenting symptoms, physical exam findings, and initial radiographic and laboratory data in the context of their chronic comorbidities is felt to place them at high risk for further clinical deterioration. Furthermore, it is not anticipated that the patient will be medically stable for discharge from the hospital within 2 midnights of admission.   * I certify that at the point of admission it is my clinical judgment that the patient will  require inpatient hospital care spanning beyond 2 midnights from the point of admission due to high intensity of service, high risk for further deterioration and high frequency of surveillance required.*       Date of Service 10/08/2023     Caleb Exon [facility]   If 7PM-7AM, please contact night-coverage [email or web address] 10/08/2023, 11:17 PM

## 2023-10-08 NOTE — ED Triage Notes (Signed)
 Pt comes via EMS from [facility] with c/o COVID+ and AMs. Pt not at baseline per family.

## 2023-10-09 ENCOUNTER — Encounter: Payer: Self-pay | Admitting: Internal Medicine

## 2023-10-09 DIAGNOSIS — U071 COVID-19: Secondary | ICD-10-CM | POA: Diagnosis not present

## 2023-10-09 DIAGNOSIS — R7989 Other specified abnormal findings of blood chemistry: Secondary | ICD-10-CM

## 2023-10-09 DIAGNOSIS — G9341 Metabolic encephalopathy: Secondary | ICD-10-CM

## 2023-10-09 DIAGNOSIS — Z7189 Other specified counseling: Secondary | ICD-10-CM | POA: Diagnosis not present

## 2023-10-09 DIAGNOSIS — E876 Hypokalemia: Secondary | ICD-10-CM

## 2023-10-09 DIAGNOSIS — K7460 Unspecified cirrhosis of liver: Secondary | ICD-10-CM

## 2023-10-09 DIAGNOSIS — Z515 Encounter for palliative care: Secondary | ICD-10-CM | POA: Diagnosis not present

## 2023-10-09 DIAGNOSIS — Z789 Other specified health status: Secondary | ICD-10-CM | POA: Diagnosis not present

## 2023-10-09 DIAGNOSIS — R4182 Altered mental status, unspecified: Secondary | ICD-10-CM

## 2023-10-09 DIAGNOSIS — C189 Malignant neoplasm of colon, unspecified: Secondary | ICD-10-CM

## 2023-10-09 DIAGNOSIS — R188 Other ascites: Secondary | ICD-10-CM

## 2023-10-09 DIAGNOSIS — I10 Essential (primary) hypertension: Secondary | ICD-10-CM

## 2023-10-09 DIAGNOSIS — D649 Anemia, unspecified: Secondary | ICD-10-CM

## 2023-10-09 DIAGNOSIS — Z66 Do not resuscitate: Secondary | ICD-10-CM

## 2023-10-09 LAB — CBC
HCT: 22.9 % — ABNORMAL LOW (ref 36.0–46.0)
Hemoglobin: 7.3 g/dL — ABNORMAL LOW (ref 12.0–15.0)
MCH: 28.1 pg (ref 26.0–34.0)
MCHC: 31.9 g/dL (ref 30.0–36.0)
MCV: 88.1 fL (ref 80.0–100.0)
Platelets: 140 K/uL — ABNORMAL LOW (ref 150–400)
RBC: 2.6 MIL/uL — ABNORMAL LOW (ref 3.87–5.11)
RDW: 17 % — ABNORMAL HIGH (ref 11.5–15.5)
WBC: 7.9 K/uL (ref 4.0–10.5)
nRBC: 0 % (ref 0.0–0.2)

## 2023-10-09 LAB — COMPREHENSIVE METABOLIC PANEL WITH GFR
ALT: 14 U/L (ref 0–44)
AST: 26 U/L (ref 15–41)
Albumin: 2.4 g/dL — ABNORMAL LOW (ref 3.5–5.0)
Alkaline Phosphatase: 85 U/L (ref 38–126)
Anion gap: 12 (ref 5–15)
BUN: 10 mg/dL (ref 8–23)
CO2: 23 mmol/L (ref 22–32)
Calcium: 7.9 mg/dL — ABNORMAL LOW (ref 8.9–10.3)
Chloride: 97 mmol/L — ABNORMAL LOW (ref 98–111)
Creatinine, Ser: 0.64 mg/dL (ref 0.44–1.00)
GFR, Estimated: >60 mL/min (ref >60)
Glucose, Bld: 170 mg/dL — ABNORMAL HIGH (ref 70–99)
Potassium: 4.4 mmol/L (ref 3.5–5.1)
Sodium: 132 mmol/L — ABNORMAL LOW (ref 135–145)
Total Bilirubin: 1.6 mg/dL — ABNORMAL HIGH (ref 0.0–1.2)
Total Protein: 5.9 g/dL — ABNORMAL LOW (ref 6.5–8.1)

## 2023-10-09 NOTE — Progress Notes (Signed)
 Per ultrasound, PA postponing paracentesis until Monday.

## 2023-10-09 NOTE — Plan of Care (Signed)

## 2023-10-09 NOTE — Progress Notes (Signed)
 PT Cancellation Note  Patient Details Name: Jenna Franklin MRN: [id number] DOB: [birth year]   Cancelled Treatment:    Reason Eval/Treat Not Completed: Other (comment).  PT consult received.  Chart reviewed.  Per discussion with OT (who discussed pt with Dr. Fausto), pt pending palliative care consult.  Will monitor pt's status and attempt evaluation pending palliative care goals of care discussion.   Damien Caulk, PT 10/09/23, 10:05 AM

## 2023-10-09 NOTE — Progress Notes (Signed)
 Patient new admission from the ED,patient is alert to self,breathing freely on room air.Patient is calm,however confused follows simple command.Vitsl signs done and recorded.Safety measures in place

## 2023-10-09 NOTE — Plan of Care (Signed)
  Problem: Health Behavior/Discharge Planning: Goal: Ability to manage health-related needs will improve Outcome: Progressing   Problem: Clinical Measurements: Goal: Ability to maintain clinical measurements within normal limits will improve Outcome: Progressing Goal: Will remain free from infection Outcome: Progressing Goal: Diagnostic test results will improve Outcome: Progressing Goal: Cardiovascular complication will be avoided Outcome: Progressing   Problem: Coping: Goal: Level of anxiety will decrease Outcome: Progressing   Problem: Elimination: Goal: Will not experience complications related to bowel motility Outcome: Progressing Goal: Will not experience complications related to urinary retention Outcome: Progressing   Problem: Pain Managment: Goal: General experience of comfort will improve and/or be controlled Outcome: Progressing   Problem: Education: Goal: Knowledge of General Education information will improve Description: Including pain rating scale, medication(s)/side effects and non-pharmacologic comfort measures Outcome: Not Progressing   Problem: Clinical Measurements: Goal: Respiratory complications will improve Outcome: Not Progressing

## 2023-10-09 NOTE — Progress Notes (Signed)
 Progress Note   Patient: Jenna Franklin FMW:[id number] DOB: Nov 12, 1940 DOA: 10/08/2023     1 DOS: the patient was seen and examined on 10/09/2023   Brief hospital course: Olivianna Higley is a 83 y.o. female with medical history significant of newly diagnosed stage IV colon with peritoneal carcinomatosis, liver cirrhosis with ascites, esophageal varices, GI bleeding, anemia, HTN, prediabetes, dCHF, spinal stenosis, kidney stone, recently diagnosed PE (s/p IVC, not candidate for anticoagulants due to high risk of bleeding), who presents with AMS and COVID infection.  See H&P for full HPI on admission & ED course.  Pt has been in rehab since 09/06/23 after admission for right femur fracture s/p surgical repair.  Pt was found with a PE during that admission, but not candidate for anticoagulation due to GI bleeding, IVC filter was placed.  Currently admitted with Covid-19 infection being managed with supportive care.    Assessment and Plan:  COVID-19 virus infection: Daughter reported that the patient is tested positive for COVID 19 today.  Patient has mild SOB, no cough, fever or chills.  Chest x-ray negative for pneumonia.  No respiratory distress. --Supportive care --As needed albuterol , Mucinex  --As needed nasal cannula oxygen --Airborne and contact precautions   Acute metabolic encephalopathy: Suspect from Covid infection. CT head negative.  Ammonia level normal 15,, less likely hepatic encephalopathy.   --Fall precaution --Neuro-checks --Delirium precautions   Cirrhosis of liver with ascites: moderate ascites noted on admission.  No abdominal pain, no tenderness on exam.  Afebrile, no leukocytosis.   Low suspicion for SBP at this time, monitor closely. Ammonia level normal 15. --Continue home lactulose  20 g twice daily --IR plans for paracentesis on Monday --Follow-up fluid analysis --Will give 25 g of albumin  before paracentesis --Combined MELD score 17 (96.9% estimated 90  day survival)   Chronic diastolic CHF (congestive heart failure) (HCC): 2D echo on 08/30/2023 showed EF 55 to 60%.  Anasarca noted on admission, due to liver cirrhosis.  BNP normal 58.7, less likely CHF decompensation. --Continue Lasix  and spironolactone    HTN (hypertension) --Nadolol  --IV hydralazine  as needed   Normocytic anemia: Hemoglobin 8.8 (8.9 on 09/05/2023).  No active bleeding. --Monitor CBC   Pulmonary embolism  - diagnosed prior admission in July.  S/p IVC filter placement Not on anticoagulation due to history of GI bleeding, risks outweigh benefits.   Hypokalemia  Hypophosphatemia:  K and phos were replaced on admission. --Monitor labs & replace PRN   Elevated lactic acid level: Resolved. Lactic acid 2.2 on admission >> 1.5 with 1 L fluids given in the ED.  No SIRS criteria to meet sepsis criteria and clinically does appear septic.   Suspect due to decreased clearance secondary to liver cirrhosis, possibly mild dehydration related to Covid if PO intake has been decreased --No further fluids given ascites and cirrhosis, edema   Colon cancer with peritoneal carcinomatosis: Patient was seen by Dr. Babara of oncology recently, had PET scan, consistent with stage IV colon cancer with peritoneal metastasis.  Patient is not a good candidate for aggressive treatment.  Palliative referral was made recently. --Palliative consult for GOC discussions and consideration of hospice        Subjective: Pt was awake but very drowsy and minimally talkative on rounds today.  She does not endorse any specific complaints.  Has a very wet sounding cough and not producing secretions with weak cough.  She denies breathing difficulty, pain or other complaints.  Physical Exam: Vitals:   10/08/23 2206 10/09/23 9482  10/09/23 0858 10/09/23 1145  BP: (!) 103/51 (!) 107/93 (!) 97/56 (!) 92/49  Pulse: 85 82 83 80  Resp: 17 18 20 16   Temp: 98.9 F (37.2 C) 98.5 F (36.9 C) 98.4 F (36.9 C) 97.8 F  (36.6 C)  TempSrc: Oral Oral    SpO2: 97% 97% 93% 97%  Weight:       General exam: awake, alert, no acute distress, mildly ill appearing HEENT: keeps eyes closed, moist mucus membranes, hearing grossly normal  Respiratory system: wet sound cough and diffuse rhonchi / upper airway secretion sounds, no expiratory wheezes, rales or rhonchi, normal respiratory effort. On room air. Cardiovascular system: normal S1/S2, RRR, + b/l LE edema.   Gastrointestinal system: moderately distended, non-tender abdomen, +bowel sounds. Central nervous system: A&O x 1+. Exam limited by poor pt engagement, no gross focal neurologic deficits Extremities: b/l pedal edema, SCD's on both legs Skin: dry, intact, normal temperature Psychiatry:limited exam due to poor pt interaction   Data Reviewed:  Notable labs -- Na 132, Cl 97, glucose 179, Ca 7.9, albumin  2.4 t bili 1.6, lactic acid normalized 1.5, Hbg 7.3, platelets 140  Family Communication: none present, will attempt to call daughter as time allows this afternoon  Disposition: Status is: Inpatient Remains inpatient appropriate because: severity of illness as above, pending paracentesis, persistent encephalopathy   Planned Discharge Destination: SNF    Time spent: 45 minutes  Author: Burnard DELENA Cunning, DO 10/09/2023 3:07 PM  For on call review [email or web address].

## 2023-10-09 NOTE — Consult Note (Signed)
 Consultation Note Date: 10/09/2023 at 1300  Patient Name: Jenna Franklin  DOB: 1940/10/20  MRN: [id number]  Age / Sex: 83 y.o., female  PCP: Justus Leita DEL, MD Referring Physician: Fausto Burnard LABOR, DO  HPI/Patient Profile: 83 y.o. female  with past medical history significant for peritoneal carcinomatosis, liver cirrhosis with ascites, anasarca, GI bleed, esophageal varices, anemia, HTN, prediabetes and PE status post IVC placement (not a candidate for [facility] due to bleeding risk).  Patient presented to ED 10/08/2023 from [facility] c/o altered mental status with positive COVID test from facility.  ED workup found patient to have WBC 7.1, troponin 8 => 8, lactic acid 2.2, GFR greater than 60, K+ 3.0, BNP 53.7.  CXR negative.  CT head negative for acute intracranial abnormalities. BP 94/71, HR 87, RR 25, SpO2 97% RA and 98.1 F  On arrival patient was unable to provide medical history.  Daughter reported via phone that patient had been doing well prior to admit [employer] [employer].  Ever since her admission to facility, patient has been declining.  Over the past several days patient became more confused, complained of shortness of breath and tested positive for COVID.  Patient's daughter also tested positive for COVID.  Patient had loose stool due to lactulose  use but no nausea, vomiting or abdominal pain.  TRH was consulted for admission and management of COVID-19 virus infection, acute metabolic encephalopathy, cirrhosis of liver with ascites, lactic acidosis and colon cancer with peritoneal carcinomatosis.  Daughter requested consult for possible hospice care.  PMT was consulted for evaluation for hospice care.  Patient is well-known to PMT after previous consult September 03, 2023.  During that visit patient was transferred to [facility] under their palliative care.    Clinical Assessment and Goals of  Care:  Ill-appearing, elderly female lying in bed.  She awakens to verbal stimuli.  She answers simple questions but does not engage in conversation.  She is sleepy during visit and does not respond to some questions.  Respirations are even and unlabored with audible wheezing noted and very wet congested cough with inability to expectorate mucus due to weakness.  Patient reports feels better today.  Denies pain.  States slept well last night.  Reports she ate breakfast today.  Denies chest pain or shortness of breath.  Extensive chart review completed prior to meeting patient including labs, vital signs, imaging, progress notes, orders, and available advanced directive documents from current and previous encounters. I then met with patient at the bedside and spoke with patient's daughter via phone to discuss diagnosis prognosis, GOC, EOL wishes, disposition and options.  I introduced Palliative Medicine as specialized medical care for people living with serious illness. It focuses on providing relief from the symptoms and stress of a serious illness. The goal is to improve quality of life for both the patient and the family.  As far as functional and nutritional status, Jenna Franklin, daughter, shares that since patient admission to [facility] she has had significant decline with decrease in appetite  and increased sleeping.  Her daughter shares that patient to [facility] for rehab but due to weakness requires significant assistance from PT with attempting to walk with walker.  We discussed patient's current illness and what it means in the larger context of patient's on-going co-morbidities.  Natural disease trajectory and expectations at EOL were discussed.  After speaking with oncology, Jenna Franklin shares that she understands her mother is not able to tolerate any aggressive treatments for her cancer.  Does not want further testing or biopsies and is interested in proceeding with comfort/hospice  care.  I attempted to elicit values and goals of care important to the patient.  Jenna Franklin shares that her fear is watching her mother suffer.  She wants her to be comfortable and not in pain.  The difference between aggressive medical intervention and comfort care was considered in light of the patient's goals of care.  Jenna Franklin is considering hospice/comfort care for her mother at this time.  Advance directives, concepts specific to code status, artificial feeding and hydration, and rehospitalization were considered and discussed.  Patient is a DNR status and considering hospice with no aggressive medical intervention.  Daughter wants to focus on supportive medical interventions only.  Education offered regarding concept specific to human mortality and the limitations of medical interventions to prolong life when the body begins to fail to thrive.  Family is facing treatment option decisions, advanced directive, and anticipatory care needs.  Jenna Franklin understands her mother is nearing end-of-life due to her stage IV cancer, liver cirrhosis and newly diagnosed COVID infection.   Discussed with patient/family the importance of continued conversation with family and the medical providers regarding overall plan of care and treatment options, ensuring decisions are within the context of the patient's values and GOCs.    Hospice and Palliative Care services outpatient were explained and offered.  Patient's daughter shares that she is scheduled for a telephonic visit with Sidra Mower, NP at cancer center for outpatient palliative care.  However she explains that she feels her mother is passed palliative and moving more toward hospice care.  Jenna Franklin expresses interest in hospice care but is unsure about the difference in hospice at [facility] and hospice services that we offer here from [facility].  Explained that [facility] prefers to use their own palliative/hospice providers and other facilities in the hospital  typically use our hospice services that we offer.  Also advised that she could choose any other hospice agency serving this area that she was familiar with.  She will contact [facility] Monday to inquire about changing bed status from rehab to hospice at [facility].  Jenna Franklin does share that she would rather her mother go to the hospice home if she is at end-of-life.  Otherwise she may talk with TOC in regards to sending her mother to a different facility using [facility].   Jenna Franklin is in agreement to reevaluate her mother's condition tomorrow to see if there is improvement or evidence of further decline.  She is unable to visit as she is extremely sick with COVID-19 as well.  Questions and concerns were addressed. The family was encouraged to call with questions or concerns.   Primary Decision Maker NEXT OF Kem Cones, daughter  Physical Exam Vitals reviewed.  Constitutional:      General: [name].    Appearance: She is ill-appearing.     Comments: Frail  HENT:     Mouth/Throat:     Mouth: Mucous membranes are  dry.  Pulmonary:     Effort: Pulmonary effort is normal.     Breath sounds: Wheezing present.     Comments: Patient has extremely congested cough during visit but is too weak to cough up mucus Skin:    General: Skin is warm and dry.  Neurological:     Mental Status: She is disoriented.     Motor: Weakness present.   Recommendations/Plan: Continue DNR/DNI  Continue current supportive interventions Family wants to reevaluate status tomorrow Daughter will make decision for hospice/end-of-life care after seeing response to treatment Extremely poor prognosis  Palliative Assessment/Data: 10-20%   Discussed plan of care with attending physician and primary RN.  Thank you for this consult. Palliative medicine will continue to follow and assist holistically.   Time Total: 90 minutes  Time spent includes: Detailed review of medical records  (labs, imaging, vital signs), medically appropriate exam (mental status, respiratory, cardiac, skin), discussed with treatment team, counseling and educating patient, family and staff, documenting clinical information, medication management and coordination of care.     Jenna Franklin Sacks, AMANDA [facility] Palliative Medicine Team  10/09/2023 9:14 AM  Office [phone]  Pager [phone]     Please contact Palliative Medicine Team providers via AMION for questions and concerns.

## 2023-10-09 NOTE — Progress Notes (Signed)
 OT Cancellation Note  Patient Details Name: Jenna Franklin MRN: [id number] DOB: [birth year]   Cancelled Treatment:    Reason Eval/Treat Not Completed: Other (comment) Orders received, chart reviewed. Pt pending palliative care consult, OT will continue to follow and check back pending goals of care discussion.  Sandy Haye L. Archit Leger, OTR/L  10/09/23, 9:59 AM

## 2023-10-10 DIAGNOSIS — U071 COVID-19: Secondary | ICD-10-CM | POA: Diagnosis not present

## 2023-10-10 DIAGNOSIS — I5032 Chronic diastolic (congestive) heart failure: Secondary | ICD-10-CM

## 2023-10-10 DIAGNOSIS — Z515 Encounter for palliative care: Secondary | ICD-10-CM | POA: Diagnosis not present

## 2023-10-10 DIAGNOSIS — Z7189 Other specified counseling: Secondary | ICD-10-CM | POA: Diagnosis not present

## 2023-10-10 DIAGNOSIS — Z789 Other specified health status: Secondary | ICD-10-CM | POA: Diagnosis not present

## 2023-10-10 LAB — CBC
HCT: 21.5 % — ABNORMAL LOW (ref 36.0–46.0)
Hemoglobin: 6.6 g/dL — ABNORMAL LOW (ref 12.0–15.0)
MCH: 27.6 pg (ref 26.0–34.0)
MCHC: 30.7 g/dL (ref 30.0–36.0)
MCV: 90 fL (ref 80.0–100.0)
Platelets: 127 K/uL — ABNORMAL LOW (ref 150–400)
RBC: 2.39 MIL/uL — ABNORMAL LOW (ref 3.87–5.11)
RDW: 17.1 % — ABNORMAL HIGH (ref 11.5–15.5)
WBC: 6.8 K/uL (ref 4.0–10.5)
nRBC: 0 % (ref 0.0–0.2)

## 2023-10-10 LAB — COMPREHENSIVE METABOLIC PANEL WITH GFR
ALT: 13 U/L (ref 0–44)
AST: 24 U/L (ref 15–41)
Albumin: 2.6 g/dL — ABNORMAL LOW (ref 3.5–5.0)
Alkaline Phosphatase: 81 U/L (ref 38–126)
Anion gap: 7 (ref 5–15)
BUN: 12 mg/dL (ref 8–23)
CO2: 24 mmol/L (ref 22–32)
Calcium: 7.8 mg/dL — ABNORMAL LOW (ref 8.9–10.3)
Chloride: 100 mmol/L (ref 98–111)
Creatinine, Ser: 0.73 mg/dL (ref 0.44–1.00)
GFR, Estimated: >60 mL/min (ref >60)
Glucose, Bld: 138 mg/dL — ABNORMAL HIGH (ref 70–99)
Potassium: 3.7 mmol/L (ref 3.5–5.1)
Sodium: 131 mmol/L — ABNORMAL LOW (ref 135–145)
Total Bilirubin: 1.3 mg/dL — ABNORMAL HIGH (ref 0.0–1.2)
Total Protein: 5.8 g/dL — ABNORMAL LOW (ref 6.5–8.1)

## 2023-10-10 LAB — PHOSPHORUS: Phosphorus: 2 mg/dL — ABNORMAL LOW (ref 2.5–4.6)

## 2023-10-10 MED ORDER — LACTULOSE 10 GM/15ML PO SOLN
20.0000 g | Freq: Every day | ORAL | Status: DC
  Administered 2023-10-12: 20 g via ORAL
  Filled 2023-10-10 (×2): qty 30

## 2023-10-10 MED ORDER — SPIRONOLACTONE 12.5 MG HALF TABLET
12.5000 mg | ORAL_TABLET | Freq: Every day | ORAL | Status: DC
  Administered 2023-10-12: 12.5 mg via ORAL
  Filled 2023-10-10 (×2): qty 1

## 2023-10-10 NOTE — Progress Notes (Signed)
 Palliative Care Progress Note, Assessment & Plan   Patient Name: Jenna Franklin       Date: 10/10/2023 DOB: 1940-10-10  Age: 83 y.o. MRN#: [id number] Attending Physician: Fausto Burnard LABOR, DO Primary Care Physician: Justus Leita DEL, MD Admit Date: 10/08/2023  Subjective: Pt reports feeling awful today but denies pain. Reports eating some breakfast. Denies SOB/CP. Slept well overnight.   HPI: 83 y.o. female  with past medical history significant for peritoneal carcinomatosis, liver cirrhosis with ascites, anasarca, GI bleed, esophageal varices, anemia, HTN, prediabetes and PE status post IVC placement (not a candidate for [facility] due to bleeding risk).  Patient presented to ED 10/08/2023 from [facility] c/o altered mental status with positive COVID test from facility.   ED workup found patient to have WBC 7.1, troponin 8 => 8, lactic acid 2.2, GFR greater than 60, K+ 3.0, BNP 53.7.  CXR negative.  CT head negative for acute intracranial abnormalities. BP 94/71, HR 87, RR 25, SpO2 97% RA and 98.1 F   On arrival patient was unable to provide medical history.  Daughter reported via phone that patient had been doing well prior to admit [facility].  Ever since her admission to facility, patient has been declining.  Over the past several days patient became more confused, complained of shortness of breath and tested positive for COVID.  Patient's daughter also tested positive for COVID.  Patient had loose stool due to lactulose  use but no nausea, vomiting or abdominal pain.   TRH was consulted for admission and management of COVID-19 virus infection, acute metabolic encephalopathy, cirrhosis of liver with ascites, lactic acidosis and colon cancer with peritoneal carcinomatosis.  Daughter requested consult  for possible hospice care.   PMT was consulted for evaluation for hospice care.  Patient is well-known to PMT after previous consult September 03, 2023.  During that visit patient was transferred to [facility] under their palliative care.    Summary of counseling/coordination of care: Extensive chart review completed prior to meeting patient including labs, vital signs, imaging, progress notes, orders, and available advanced directive documents from current and previous encounters.   After reviewing the patient's chart and assessing the patient at bedside, I spoke with patient in regards to symptom management and goals of care.   Ill-appearing, elderly female lying in bed. She awakens to verbal stimuli. She does answer some questions but falls back to sleep. Pt had extremely wet, congested cough but is not able to expectorate mucous due to weakness. Respirations are even and unlabored. She is in no distress.   Received message that Dr. Fausto had contacted patient's daughter, [name], via phone, to discuss drop in Hgb requiring transfusion. Dr. Fausto shares that daughter has chosen to de-escalate care due to poor prognosis and requests evaluation for hospice home.  Request placed to [facility] liaison for evaluation for IPU. Saddie Na, [facility] liaison notifies that at this time, patient is not IPU appropriate, but will be re-evaluated tomorrow. If patient does not meet IPU criteria, daughter is amenable for her mother to returning to [facility] under their hospice care services.   Physical Exam Vitals reviewed.  Constitutional:      General: She is not  in acute distress.    Appearance: She is ill-appearing.  HENT:     Head: Normocephalic and atraumatic.     Mouth/Throat:     Mouth: Mucous membranes are dry.  Pulmonary:     Effort: Pulmonary effort is normal. No respiratory distress.     Comments: Wet,congested cough Musculoskeletal:     Right lower leg: Edema present.     Left lower  leg: Edema present.  Skin:    General: Skin is warm and dry.  Neurological:     Mental Status: She is alert. She is disoriented.     Motor: Weakness present.   Recommendations/Plan: Continue DNR/DNI  Discontinuing labs, blood transfusions, paracentesis. Only procedures for comfort at this time  Daughter requests transfer to hospice home [facility] liaison shares patient does not meet IPU criteria at this time- re-eval tomorrow  Patient may go back to [facility] hospice if continues to be not IPU appropriate Extremely poor prognosis          Total Time 50 minutes   Time spent includes: Detailed review of medical records (labs, imaging, vital signs), medically appropriate exam (mental status, respiratory, cardiac, skin), discussed with treatment team, counseling and educating patient, family and staff, documenting clinical information, medication management and coordination of care.     [name] Sacks, AMANDA [facility] Palliative Medicine Team  10/10/2023 8:47 AM  Office [phone]  Pager [phone]

## 2023-10-10 NOTE — Plan of Care (Signed)

## 2023-10-10 NOTE — Progress Notes (Signed)
 Son, Reyes, called and updated on patients condition and status.

## 2023-10-10 NOTE — Progress Notes (Signed)
 Progress Note   Patient: Jenna Franklin FMW:[id number] DOB: 12/27/1940 DOA: 10/08/2023     2 DOS: the patient was seen and examined on 10/10/2023   Brief hospital course: Jenna Franklin is a 83 y.o. female with medical history significant of newly diagnosed stage IV colon with peritoneal carcinomatosis, liver cirrhosis with ascites, esophageal varices, GI bleeding, anemia, HTN, prediabetes, dCHF, spinal stenosis, kidney stone, recently diagnosed PE (s/p IVC, not candidate for anticoagulants due to high risk of bleeding), who presents with AMS and COVID infection.  See H&P for full HPI on admission & ED course.  Pt has been in rehab since 09/06/23 after admission for right femur fracture s/p surgical repair.  Pt was found with a PE during that admission, but not candidate for anticoagulation due to GI bleeding, IVC filter was placed.  Currently admitted with Covid-19 infection being managed with supportive care.    Assessment and Plan:  COVID-19 virus infection: Daughter reported that the patient is tested positive for COVID 19 today.  Patient has mild SOB, no cough, fever or chills.  Chest x-ray negative for pneumonia.  No respiratory distress. --Supportive care --As needed albuterol , Mucinex  --As needed nasal cannula oxygen --Airborne and contact precautions   Acute metabolic encephalopathy: Suspect from Covid infection. CT head negative.  Ammonia level normal 15,, less likely hepatic encephalopathy.   --Fall precaution --Neuro-checks --Delirium precautions   Cirrhosis of liver with ascites: moderate ascites noted on admission.  No abdominal pain, no tenderness on exam.  Afebrile, no leukocytosis.   Low suspicion for SBP at this time, monitor closely. Ammonia level normal 15. --Reduce lactulose  20 g twice daily >> once daily --Cancelled paracentesis per GOC --Combined MELD score 17 (96.9% estimated 90 day survival)   Chronic diastolic CHF (congestive heart failure) (HCC):  2D echo on 08/30/2023 showed EF 55 to 60%.  Anasarca noted on admission, due to liver cirrhosis.  BNP normal 58.7, less likely CHF decompensation. --Continue Lasix  and spironolactone    HTN (hypertension) --Nadolol  --IV hydralazine  as needed   Normocytic anemia: Hemoglobin 8.8 (8.9 on 09/05/2023).  No active bleeding. --No further labs    Pulmonary embolism  - diagnosed prior admission in July.  S/p IVC filter placement Not on anticoagulation due to history of GI bleeding, risks outweigh benefits.   Hypokalemia  Hypophosphatemia:  K and phos were replaced on admission. --No further labs    Elevated lactic acid level: Resolved. Lactic acid 2.2 on admission >> 1.5 with 1 L fluids given in the ED.  No SIRS criteria to meet sepsis criteria and clinically does appear septic.   Suspect due to decreased clearance secondary to liver cirrhosis, possibly mild dehydration related to Covid if PO intake has been decreased --No further fluids given ascites and cirrhosis, edema   Colon cancer with peritoneal carcinomatosis: Patient was seen by Dr. Babara of oncology recently, had PET scan, consistent with stage IV colon cancer with peritoneal metastasis.  Patient is not a good candidate for aggressive treatment.   Palliative referral was made recently. --Palliative consult for GOC discussions and consideration of hospice  Goals of Care --- no further labs, no blood transfusions, paracentesis cancelled - will only do procedure if it will improve patient's comfort or reduce pain. --Palliative NP following --Hospice liaison notified to assess eligibility for inpatient hospice unit --Otherwise plan would be d/c back to [facility] with hospice        Subjective: Pt appeared sleeping on rounds, responds easily to voice.  She  denies feeling short of breath or having abdominal pain.   Physical Exam: Vitals:   10/09/23 2004 10/10/23 0337 10/10/23 0547 10/10/23 0910  BP: (!) 95/52 (!) 99/53  (!)  112/53  Pulse: 87 83  86  Resp: 14 18  18   Temp: 99 F (37.2 C) 98.3 F (36.8 C)  99.1 F (37.3 C)  TempSrc: Oral Oral  Oral  SpO2: 97% 97%  96%  Weight:   66.1 kg    General exam: sleeping but responds to voice, no acute distress, mildly ill appearing HEENT: keeps eyes closed, moist mucus membranes, hearing grossly normal  Respiratory system: ongoing but improved wet sound cough, improved rhonchi / upper airway sounds, lungs more clear today, no expiratory wheezesnormal respiratory effort. On room air. Cardiovascular system: normal S1/S2, RRR, + b/l LE edema.   Gastrointestinal system: moderately distended, non-tender abdomen, +bowel sounds. Central nervous system: A&O x 1+. Exam limited by poor pt engagement, no gross focal neurologic deficits Extremities: b/l pedal edema, SCD's on both legs Skin: dry, intact, normal temperature Psychiatry: normal mood, flat affect   Data Reviewed:  Notable labs -- Na 131, glucose 138, Ca 7.8, albumin  2.6 t bili 1.3, Hbg 7.3 >> 6.6, platelets 127  Family Communication: updated daughter, [name], by phone this afternoon. We discussed patient's current medical status and multiple issues, both acute and chronic.  Daughter has spoke with her sibling/s and all are in agreement to forego further aggressive medical interventions including lab draws, blood transfusions, cancelled paracentesis.  They are ready for transition to hospice and want patient to be assess for eligibility for inpatient hospice unit.  Palliative Care NP informed and will also call daughter today, has contacted hospice liaison as well.    Disposition: Status is: Inpatient Remains inpatient appropriate because: severity of illness as above, pending paracentesis, persistent encephalopathy   Planned Discharge Destination: SNF    Time spent: 52 minutes including time at bedside and in coordination of care  Author: Burnard DELENA Cunning, DO 10/10/2023 2:43 PM  For on call review  [email or web address].

## 2023-10-10 NOTE — Progress Notes (Signed)
 PT Cancellation Note  Patient Details Name: Jenna Franklin MRN: [id number] DOB: 1940/02/19   Cancelled Treatment:    Reason Eval/Treat Not Completed: Other (comment).  Chart reviewed.  Pt's Hgb trending down to 6.6 today.  Goals of care/plan of care pending.  Per discussion with MD Fausto, will hold therapy at this time (will monitor pt's status and attempt evaluation at a later date/time as appropriate).  Damien Caulk, PT 10/10/23, 11:34 AM

## 2023-10-10 NOTE — TOC Progression Note (Signed)
 Transition of Care [facility]) - Progression Note    Patient Details  Name: Jenna Franklin MRN: [id number] Date of Birth: 02/18/1940  [facility]) CM/SW Contact  Marinda Cooks, RN Phone Number: 10/10/2023, 2:44 PM  Clinical Narrative:      This CM alerted by Hospice Team that pt's daughter is interested IPU with [facility] Care for pt, this CM  spoke with (Daughter) [name] Cones at  [phone] introduced my role and dicussed pt's dc plan and her desire for Hospice services via [facility] . Ms. Combs shared that pt admitted from [facility] where she was receiving Rehab until she contracted Covid began to decline and was admitted here. Ms. Cones shared she was communicating with staff at [facility] regarding pt transitioning to their hospice agency of choice also, however now that pt is here and can potentially meet criteria with IPU via [facility] she would like to explore that option.This CM  alerted [facility] liaison of discussion with Ms. Combs . This CM  informed Ms. Combs someone would be reaching out to her soon. TOC will cont to follow dc planning / care coordination and update as applicable.     Expected Discharge Plan and Services    TBD    Social Drivers of Health (SDOH) Interventions SDOH Screenings   Food Insecurity: No Food Insecurity (10/09/2023)  Housing: Low Risk  (10/09/2023)  Transportation Needs: No Transportation Needs (10/09/2023)  Utilities: Not At Risk (10/09/2023)  Alcohol Screen: Low Risk  (08/12/2022)  Depression (PHQ2-9): Low Risk  (08/12/2022)  Financial Resource Strain: Low Risk  (08/12/2022)  Physical Activity: Inactive (08/12/2022)  Social Connections: Unknown (10/09/2023)  Recent Concern: Social Connections - Socially Isolated (08/26/2023)  Stress: No Stress Concern Present (08/12/2022)  Tobacco Use: Low Risk  (10/08/2023)    Readmission Risk Interventions     No data to display

## 2023-10-10 NOTE — Plan of Care (Signed)
  Problem: Clinical Measurements: Goal: Ability to maintain clinical measurements within normal limits will improve Outcome: Progressing Goal: Will remain free from infection Outcome: Progressing Goal: Diagnostic test results will improve Outcome: Progressing Goal: Cardiovascular complication will be avoided Outcome: Progressing   Problem: Activity: Goal: Risk for activity intolerance will decrease Outcome: Progressing   Problem: Nutrition: Goal: Adequate nutrition will be maintained Outcome: Progressing   Problem: Coping: Goal: Level of anxiety will decrease Outcome: Progressing   Problem: Elimination: Goal: Will not experience complications related to bowel motility Outcome: Progressing Goal: Will not experience complications related to urinary retention Outcome: Progressing   Problem: Pain Managment: Goal: General experience of comfort will improve and/or be controlled Outcome: Progressing   Problem: Safety: Goal: Ability to remain free from injury will improve Outcome: Progressing   Problem: Skin Integrity: Goal: Risk for impaired skin integrity will decrease Outcome: Progressing   Problem: Clinical Measurements: Goal: Respiratory complications will improve Outcome: Not Progressing

## 2023-10-10 NOTE — Progress Notes (Signed)
 [facility]) HOSPITAL LIAISON NOTE  Received request from Shanea Lining, Transitions of Care (TOC), for evaluation for hospice IPU.  Patient does not currently meet criteria for IPU.  No acute symptom management needs.  Patient ate 50% of breakfast, 30% of lunch and took her po meds today.  Notified hospital team and daughter that patient does not qualify for IPU today but we will reassess tomorrow.  Dr. Fausto, [name] Sacks NP/PMT on board with plan as patient was just made comfort care this afternoon.  I spoke with [name], daughter,  initiate education related to hospice philosophy, services, and team approach to care. She verbalizes understanding of IPU criteria and of hospice care.  Per discussion, the plan is re-evaluation for IPU tomorrow.  If not appropriate daughter will look at taking her back to [facility] with hospice.     Above information shared with Marinda Cooks, TOC and hospital medical care team.    Please call with any hospice related questions or concerns.  Thank you for the opportunity to participate in this patient's care.    Saddie HILARIO Na, MA, BSN, RN, FNE Nurse Liaison [phone]

## 2023-10-10 NOTE — Progress Notes (Incomplete)
 {  Select_TRH_Note:[id number]}

## 2023-10-11 DIAGNOSIS — Z515 Encounter for palliative care: Secondary | ICD-10-CM

## 2023-10-11 DIAGNOSIS — U071 COVID-19: Secondary | ICD-10-CM | POA: Diagnosis not present

## 2023-10-11 DIAGNOSIS — Z789 Other specified health status: Secondary | ICD-10-CM | POA: Diagnosis not present

## 2023-10-11 DIAGNOSIS — Z66 Do not resuscitate: Secondary | ICD-10-CM | POA: Diagnosis not present

## 2023-10-11 MED ORDER — ONDANSETRON 4 MG PO TBDP: 4.0000 mg | ORAL_TABLET | Freq: Three times a day (TID) | ORAL | Status: DC | PRN

## 2023-10-11 MED ORDER — ENSURE PLUS HIGH PROTEIN PO LIQD: 237.0000 mL | Freq: Two times a day (BID) | ORAL | Status: DC

## 2023-10-11 MED ORDER — LACTULOSE 10 GM/15ML PO SOLN: 20.0000 g | Freq: Every day | ORAL | Status: DC

## 2023-10-11 MED ORDER — GUAIFENESIN-DM 100-10 MG/5ML PO SYRP: 5.0000 mL | ORAL_SOLUTION | ORAL | Status: DC | PRN

## 2023-10-11 MED ORDER — LORAZEPAM 0.5 MG PO TABS: 0.5000 mg | ORAL_TABLET | Freq: Three times a day (TID) | ORAL | 0 refills | Status: DC | PRN

## 2023-10-11 MED ORDER — HYDROCODONE-ACETAMINOPHEN 5-325 MG PO TABS
1.0000 | ORAL_TABLET | Freq: Four times a day (QID) | ORAL | 0 refills | Status: DC | PRN
Start: 2023-10-11 — End: 2023-10-26

## 2023-10-11 MED ORDER — GUAIFENESIN-DM 100-10 MG/5ML PO SYRP
5.0000 mL | ORAL_SOLUTION | ORAL | Status: DC | PRN
  Administered 2023-10-11 – 2023-10-12 (×2): 5 mL via ORAL
  Filled 2023-10-11 (×2): qty 10

## 2023-10-11 MED ORDER — ALBUTEROL SULFATE (2.5 MG/3ML) 0.083% IN NEBU: 2.5000 mg | INHALATION_SOLUTION | RESPIRATORY_TRACT | Status: DC | PRN

## 2023-10-11 NOTE — Care Management Important Message (Signed)
 Important Message  Patient Details  Name: Jenna Franklin MRN: [id number] Date of Birth: 1940-04-22   Important Message Given:  Yes - Medicare IM     Rojelio [name] 10/11/2023, 12:35 PM

## 2023-10-11 NOTE — NC FL2 (Signed)
 [place]  MEDICAID FL2 LEVEL OF CARE FORM     IDENTIFICATION  Patient Name: Jenna Franklin Birthdate: [birth year] Sex: female Admission Date (Current Location): 10/08/2023  [place] and [place] Number:  Chiropodist and Address:  [facility], [address] [place], [place] [address]      Provider Number: [id number]  Attending Physician Name and Address:  Fausto Burnard LABOR, DO  Relative Name and Phone Number:       Current Level of Care: Hospital Recommended Level of Care: Skilled Nursing Facility Prior Approval Number:    Date Approved/Denied:   PASRR Number: Manual review  Discharge Plan: SNF    Current Diagnoses: Patient Active Problem List   Diagnosis Date Noted   Hospice care patient 10/11/2023   COVID-19 virus infection 10/08/2023   Pulmonary embolism (HCC) 10/08/2023   HTN (hypertension) 10/08/2023   Acute metabolic encephalopathy 10/08/2023   Normocytic anemia 10/08/2023   Colon cancer with peritoneal carcinomatosis 10/08/2023   Hypokalemia 10/08/2023   Elevated lactic acid level 10/08/2023   Hypophosphatemia 10/08/2023   Bilateral lower extremity edema 10/04/2023   Acute GI bleeding 09/03/2023   Acute pulmonary embolism (HCC) 09/02/2023   Closed fracture of right hip (HCC) 08/30/2023   Hepatic encephalopathy (HCC) 08/30/2023   Protein-calorie malnutrition, severe 08/27/2023   Right femoral fracture (HCC) 08/26/2023   Colonic mass 08/26/2023   Weight loss 08/26/2023   TMJ arthralgia 05/26/2019   Lumbar spinal stenosis 07/27/2018   Thoracic compression fracture, sequela 04/01/2018   Esophageal varices in cirrhosis (HCC)    Cirrhosis of liver with ascites (HCC) 05/29/2015   Post-menopausal atrophic vaginitis 05/29/2015   Anxiety 03/25/2015   Acid reflux 03/25/2015   Pre-diabetes 09/20/2014   Chronic diastolic CHF (congestive heart failure) (HCC) 10/05/2013   Bursitis, trochanteric 12/11/2011    Orientation  RESPIRATION BLADDER Height & Weight     Self  Normal Incontinent Weight: 144 lb 6.4 oz (65.5 kg) Height:     BEHAVIORAL SYMPTOMS/MOOD NEUROLOGICAL BOWEL NUTRITION STATUS   (None)  (None) Continent Diet (Heart healthy. Fluid restriction 1500 mL.)  AMBULATORY STATUS COMMUNICATION OF NEEDS Skin   Extensive Assist Verbally Bruising, Other (Comment), PU Stage and Appropriate Care (Erythema/redness.)   PU Stage 2 Dressing:  (Buttocks: Foam.)                   Personal Care Assistance Level of Assistance  Bathing, Feeding, Dressing Bathing Assistance: Maximum assistance Feeding assistance: Limited assistance Dressing Assistance: Maximum assistance     Functional Limitations Info  Sight, Hearing, Speech Sight Info: Adequate Hearing Info: Adequate Speech Info: Adequate    SPECIAL CARE FACTORS FREQUENCY  PT (By licensed PT), OT (By licensed OT)     PT Frequency: 5 x week OT Frequency: 5 x week            Contractures Contractures Info: Not present    Additional Factors Info  Code Status, Allergies Code Status Info: DNR Allergies Info: Baclofen , Codeine, Sulfa Antibiotics, Prednisone            Current Medications (10/11/2023):  This is the current hospital active medication list Current Facility-Administered Medications  Medication Dose Route Frequency Provider Last Rate Last Admin   acetaminophen  (TYLENOL ) tablet 325 mg  325 mg Oral Q6H PRN Niu, Xilin, MD       albuterol  (PROVENTIL ) (2.5 MG/3ML) 0.083% nebulizer solution 2.5 mg  2.5 mg Nebulization Q4H PRN Niu, Xilin, MD       feeding supplement (  ENSURE PLUS HIGH PROTEIN) liquid 237 mL  237 mL Oral BID BM Niu, Xilin, MD   237 mL at 10/11/23 1416   furosemide  (LASIX ) tablet 20 mg  20 mg Oral Daily Niu, Xilin, MD   20 mg at 10/10/23 9045   guaiFENesin -dextromethorphan  (ROBITUSSIN DM) 100-10 MG/5ML syrup 5 mL  5 mL Oral Q4H PRN Fausto Sor A, DO   5 mL at 10/11/23 1415   hydrALAZINE  (APRESOLINE ) injection 5 mg  5 mg  Intravenous Q2H PRN Niu, Xilin, MD       HYDROcodone -acetaminophen  (NORCO/VICODIN) 5-325 MG per tablet 1 tablet  1 tablet Oral Q6H PRN Niu, Xilin, MD       lactulose  (CHRONULAC ) 10 GM/15ML solution 20 g  20 g Oral Daily Fausto Sor A, DO       nadolol  (CORGARD ) tablet 20 mg  20 mg Oral Daily Niu, Xilin, MD   20 mg at 10/10/23 9045   ondansetron  (ZOFRAN ) injection 4 mg  4 mg Intravenous Q8H PRN Niu, Xilin, MD       spironolactone  (ALDACTONE ) tablet 12.5 mg  12.5 mg Oral Daily Fausto Sor LABOR, DO         Discharge Medications: Please see discharge summary for a list of discharge medications.  Relevant Imaging Results:  Relevant Lab Results:   Additional Information SS#: [id number]  Lauraine JAYSON Carpen, LCSW

## 2023-10-11 NOTE — Progress Notes (Addendum)
 Palliative Care Progress Note, Assessment & Plan   Patient Name: Jenna Franklin       Date: 10/11/2023 DOB: [birth year]  Age: 83 y.o. MRN#: [id number] Attending Physician: Fausto Burnard LABOR, DO Primary Care Physician: Justus Leita DEL, MD Admit Date: 10/08/2023  Subjective: Patient reports feeling okay today.  States slept well last night.  Ate most of her breakfast this morning.  She denies pain.  Denies chest pain or shortness of breath as well.  HPI: 83 y.o. female  with past medical history significant for peritoneal carcinomatosis, liver cirrhosis with ascites, anasarca, GI bleed, esophageal varices, anemia, HTN, prediabetes and PE status post IVC placement (not a candidate for [facility] due to bleeding risk).  Patient presented to ED 10/08/2023 from [facility] c/o altered mental status with positive COVID test from facility.   ED workup found patient to have WBC 7.1, troponin 8 => 8, lactic acid 2.2, GFR greater than 60, K+ 3.0, BNP 53.7.  CXR negative.  CT head negative for acute intracranial abnormalities. BP 94/71, HR 87, RR 25, SpO2 97% RA and 98.1 F   On arrival patient was unable to provide medical history.  Daughter reported via phone that patient had been doing well prior to admit [employer].  Ever since her admission to facility, patient has been declining.  Over the past several days patient became more confused, complained of shortness of breath and tested positive for COVID.  Patient's daughter also tested positive for COVID.  Patient had loose stool due to lactulose  use but no nausea, vomiting or abdominal pain.   TRH was consulted for admission and management of COVID-19 virus infection, acute metabolic encephalopathy, cirrhosis of liver with ascites, lactic acidosis and colon cancer  with peritoneal carcinomatosis.  Daughter requested consult for possible hospice care.   PMT was consulted for evaluation for hospice care.  Patient is well-known to PMT after previous consult September 03, 2023.  During that visit patient was transferred to [facility] under their palliative care.    Summary of counseling/coordination of care: Extensive chart review completed prior to meeting patient including labs, vital signs, imaging, progress notes, orders, and available advanced directive documents from current and previous encounters.   After reviewing the patient's chart and assessing the patient at bedside, I spoke with patient in regards to symptom management and goals of care.   Ill-appearing, elderly female lying in bed.  She awakens to verbal stimuli.  She will answer questions but quickly falls back to sleep during visit.  Respirations are even and nonlabored.  She is in no distress.  [facility] liaison advises that patient does not meet criteria for IPU today.  Per note and epic chat, his daughter is amenable to transferring her mother to [facility] under hospice services.   Physical Exam Vitals reviewed.  Constitutional:      General: [name].    Appearance: She is ill-appearing.  HENT:     Head: Normocephalic and atraumatic.     Mouth/Throat:     Mouth: Mucous membranes are dry.  Pulmonary:     Effort: Pulmonary effort is normal. No respiratory distress.  Skin:    General: Skin  is warm and dry.  Neurological:     Mental Status: She is alert. She is disoriented.     Motor: Weakness present.     Recommendations/Plan: Continue DNR/DNI  Discontinuing labs, blood transfusions, paracentesis. Only procedures for comfort at this time  [facility] liaison shares patient does not meet IPU criteria at this time- re-eval tomorrow  Daughter request patient may go back to [facility] hospice if continues to be not IPU appropriate Extremely poor prognosis                 Total Time 35 minutes   Plan of care discussed via epic chat with Dr. Fausto, [facility] liaison and primary RN  Time spent includes: Detailed review of medical records (labs, imaging, vital signs), medically appropriate exam (mental status, respiratory, cardiac, skin), discussed with treatment team, counseling and educating patient, family and staff, documenting clinical information, medication management and coordination of care.     [name] Sacks, AMANDA [facility] Acute Care Palliative Medicine Team  10/11/2023 9:32 AM  Office [phone]  Pager [phone]

## 2023-10-11 NOTE — TOC Initial Note (Addendum)
 Transition of Care [facility]) - Initial/Assessment Note    Patient Details  Name: Jenna Franklin MRN: [id number] Date of Birth: [birth year]  [facility]) CM/SW Contact:    Lauraine JAYSON Carpen, LCSW Phone Number: 10/11/2023, 2:20 PM  Clinical Narrative:  Patient was admitted from [facility]. She has been there 7/21-8/22 (32 days) for rehab. [facility] liaison reviewed case today and determined she does not meet criteria for the hospice home. Attending physician spoke to daughter about returning to [facility] with hospice which the SNF liaison confirmed they can accommodate. CSW called daughter to confirm plan but she does not want her to transition to hospice yet. They are working on setting her long-term care insurance policy up to cover for her to stay at the SNF long-term. Daughter would like for patient to return to SNF and resume rehab. Explained that we have to get a new insurance authorization whenever patients are admitted to the hospital. Attending physician will consult PT and OT. Once the notes are in, will start insurance authorization. SNF liaison said they can take her back once the auth is pending. No further concerns. CSW will continue to follow patient and her daughter for support and facilitate return to SNF once authorization is started.     3:39 pm: PASARR expired on 8/20. CSW submitted new screening and it is currently under manual review. CSW uploaded requested documents. Left message for SNF liaison to see if she wanted the facility to start auth or the hospital.            3:56 pm: CSW started insurance authorization.  4:04 pm: Updated daughter.  Expected Discharge Plan: Skilled Nursing Facility Barriers to Discharge: Continued Medical Work up   Patient Goals and CMS Choice     Choice offered to / list presented to : Adult Children      Expected Discharge Plan and Services     Post Acute Care Choice: Skilled Nursing Facility Living  arrangements for the past 2 months: Single Family Home, Skilled Nursing Facility Expected Discharge Date: 10/11/23                                    Prior Living Arrangements/Services Living arrangements for the past 2 months: Single Family Home, Skilled Nursing Facility Lives with:: Facility Resident Patient language and need for interpreter reviewed:: Yes Do you feel safe going back to the place where you live?: Yes      Need for Family Participation in Patient Care: Yes (Comment) Care giver support system in place?: Yes (comment)   Criminal Activity/Legal Involvement Pertinent to Current Situation/Hospitalization: No - Comment as needed  Activities of Daily Living   ADL Screening (condition at time of admission) Independently performs ADLs?: No Does the patient have a NEW difficulty with bathing/dressing/toileting/self-feeding that is expected to last >3 days?: No Does the patient have a NEW difficulty with getting in/out of bed, walking, or climbing stairs that is expected to last >3 days?: Yes (Initiates electronic notice to provider for possible PT consult) Does the patient have a NEW difficulty with communication that is expected to last >3 days?: No  Permission Sought/Granted Permission sought to share information with : Facility Medical sales representative, Family Supports    Share Information with NAME: [name] Cones  Permission granted to share info w AGENCY: Licensed conveyancer granted to share info w Relationship: Daughter  Permission granted to  share info w Contact Information: [phone]  Emotional Assessment   Attitude/Demeanor/Rapport: Unable to Assess Affect (typically observed): Unable to Assess Orientation: : Oriented to Self Alcohol / Substance Use: Not Applicable Psych Involvement: No (comment)  Admission diagnosis:  COVID-19 virus infection [U07.1] Patient Active Problem List   Diagnosis Date Noted   Hospice care patient 10/11/2023    COVID-19 virus infection 10/08/2023   Pulmonary embolism (HCC) 10/08/2023   HTN (hypertension) 10/08/2023   Acute metabolic encephalopathy 10/08/2023   Normocytic anemia 10/08/2023   Colon cancer with peritoneal carcinomatosis 10/08/2023   Hypokalemia 10/08/2023   Elevated lactic acid level 10/08/2023   Hypophosphatemia 10/08/2023   Bilateral lower extremity edema 10/04/2023   Acute GI bleeding 09/03/2023   Acute pulmonary embolism (HCC) 09/02/2023   Closed fracture of right hip (HCC) 08/30/2023   Hepatic encephalopathy (HCC) 08/30/2023   Protein-calorie malnutrition, severe 08/27/2023   Right femoral fracture (HCC) 08/26/2023   Colonic mass 08/26/2023   Weight loss 08/26/2023   TMJ arthralgia 05/26/2019   Lumbar spinal stenosis 07/27/2018   Thoracic compression fracture, sequela 04/01/2018   Esophageal varices in cirrhosis (HCC)    Cirrhosis of liver with ascites (HCC) 05/29/2015   Post-menopausal atrophic vaginitis 05/29/2015   Anxiety 03/25/2015   Acid reflux 03/25/2015   Pre-diabetes 09/20/2014   Chronic diastolic CHF (congestive heart failure) (HCC) 10/05/2013   Bursitis, trochanteric 12/11/2011   PCP:  Justus Leita DEL, MD Pharmacy:   [facility] - [place], [place] - [address] [place] [address] Phone: [phone] Fax: [phone]     Social Drivers of Health (SDOH) Social History: SDOH Screenings   Food Insecurity: No Food Insecurity (10/09/2023)  Housing: Low Risk  (10/09/2023)  Transportation Needs: No Transportation Needs (10/09/2023)  Utilities: Not At Risk (10/09/2023)  Alcohol Screen: Low Risk  (08/12/2022)  Depression (PHQ2-9): Low Risk  (08/12/2022)  Financial Resource Strain: Low Risk  (08/12/2022)  Physical Activity: Inactive (08/12/2022)  Social Connections: Unknown (10/09/2023)  Recent Concern: Social Connections - Socially Isolated (08/26/2023)  Stress: No Stress Concern Present (08/12/2022)  Tobacco Use: Low Risk  (10/08/2023)    SDOH Interventions:     Readmission Risk Interventions     No data to display

## 2023-10-11 NOTE — Evaluation (Signed)
 Physical Therapy Evaluation Patient Details Name: Jenna Franklin MRN: [id number] DOB: 11-24-1940 Today's Date: 10/11/2023  History of Present Illness  Pt is an 83 y.o female presenting to hospital 10/08/23 with c/o weakness and AMS; also pt was diagnosed with COVID.  Hospitalization 7/10-8/21/25 d/t R femoral fx (s/p IMN R 08/26/23) and pt found to have PE (pt not a good candidate for anticoagulant use d/t high risk of re-bleeding; pt s/p IVC filter placement).  Pt now admitted with COVID-19 virus infection, acute metabolic encephalopathy, cirrhosis of liver with ascites.  PMH includes possible stage IV colon CA with peritoneal carcinomatosis, liver cirrhosis with ascites, esophageal varices, GI bleeding, anemia, htn, prediabetes, dCHF, spinal stenosis, kidney stone, PE (s/p IVC filter), vertigo.  Clinical Impression  PT/OT co-evaluation performed.  Of note, pt's Hgb noted to be 6.6 yesterday (discussed with MD Fausto who cleared pt for therapy participation).  Prior to recent medical concerns and hospitalizations, per chart review pt ambulated using RW and lived alone; has recently been at rehab.  Pt verbalizing minimal information during session; unsure of accuracy of pt's limited answers; pt inconsistent with following 1 step cues and pt often not initiating movement when cued.  Pt coughing during session (appearing congested)--nurse notified.  Pt also often with eyes closed during session.  Currently pt is max assist x2 with bed mobility and sitting balance progressed from mod assist x2 to close SBA x2.  Limited activity d/t pt's generalized weakness.  Pt may benefit from skilled PT trial to attempt to address noted impairments and functional limitations (see below for any additional details).    If plan is discharge home, recommend the following: Two people to help with walking and/or transfers;Two people to help with bathing/dressing/bathroom;Assistance with cooking/housework;Assistance with  feeding;Direct supervision/assist for medications management;Assist for transportation;Help with stairs or ramp for entrance   Can travel by private vehicle   No    Equipment Recommendations Other (comment) (TBD at next facility)  Recommendations for Other Services       Functional Status Assessment Patient has had a recent decline in their functional status and/or demonstrates limited ability to make significant improvements in function in a reasonable and predictable amount of time     Precautions / Restrictions Precautions Precautions: Fall Recall of Precautions/Restrictions: Impaired Restrictions Weight Bearing Restrictions Per Provider Order: Yes RLE Weight Bearing Per Provider Order: Weight bearing as tolerated      Mobility  Bed Mobility Overal bed mobility: Needs Assistance Bed Mobility: Supine to Sit, Sit to Supine     Supine to sit: Max assist, +2 for physical assistance, HOB elevated Sit to supine: Max assist, +2 for physical assistance, HOB elevated   General bed mobility comments: vc's for technique; assist for trunk and B LE's; minimal initiation by pt noted    Transfers                   General transfer comment: Deferred d/t safety concerns    Ambulation/Gait                  Stairs            Wheelchair Mobility     Tilt Bed    Modified Rankin (Stroke Patients Only)       Balance Overall balance assessment: Needs assistance Sitting-balance support: Bilateral upper extremity supported, Feet unsupported Sitting balance-Leahy Scale: Poor Sitting balance - Comments: Initially mod assist x2 progressing to close SBA x2 Postural control: Posterior lean  Pertinent Vitals/Pain Pain Assessment Pain Assessment: Faces Faces Pain Scale: Hurts little more Pain Location: chest with coughing; generalized body pain Pain Descriptors / Indicators: Discomfort, Grimacing Pain  Intervention(s): Limited activity within patient's tolerance, Monitored during session, Repositioned    Home Living Family/patient expects to be discharged to:: Private residence Living Arrangements: Alone Available Help at Discharge: Family;Available PRN/intermittently Type of Home: House Home Access: Stairs to enter   [employer] of Steps: pt unsure of number or rails   Home Layout: One level Home Equipment: Shower seat - built Char[employer] fundraiser (2 wheels);Cane - single point Additional Comments: Pt recently at [facility] for rehab (7/21-8/22/25).  Pt unable to verbalize PLOF or home set-up (home set-up and PLOF information per therapy from recent hospitalization).    Prior Function Prior Level of Function : Independent/Modified Independent             Mobility Comments: Uses RW ADLs Comments: Family assist PRN     Extremity/Trunk Assessment   Upper Extremity Assessment Upper Extremity Assessment: Defer to OT evaluation;Generalized weakness    Lower Extremity Assessment Lower Extremity Assessment: Generalized weakness (able to wiggle B toes; pt did not initiate movement for rest of LE assessment)    Cervical / Trunk Assessment Cervical / Trunk Assessment: Other exceptions Cervical / Trunk Exceptions: forward head/shoulders  Communication   Communication Communication: No apparent difficulties    Cognition Arousal: Lethargic (Pt often with eyes closed) Behavior During Therapy: Flat affect   PT - Cognitive impairments: No family/caregiver present to determine baseline, Difficult to assess                       PT - Cognition Comments: Pt able to state name only.  Pt very inconsistent with following cues, answering questions, and initiating movement in general. Following commands: Impaired Following commands impaired: Follows one step commands inconsistently, Follows one step commands with increased time     Cueing Cueing Techniques:  Verbal cues, Tactile cues, Visual cues     General Comments General comments (skin integrity, edema, etc.): R>L lower leg, ankle, and feet swelling noted (nurse notified).  Supine BP 110/60 with HR 104 bpm and SpO2 sats 96% room air; sitting (on EOB) BP 111/64 with HR 107 bpm and SpO2 sats 96% on room air.    Exercises     Assessment/Plan    PT Assessment Patient needs continued PT services  PT Problem List Decreased strength;Decreased activity tolerance;Decreased balance;Decreased mobility;Decreased cognition;Decreased knowledge of use of DME;Decreased safety awareness;Decreased knowledge of precautions;Cardiopulmonary status limiting activity;Pain       PT Treatment Interventions DME instruction;Gait training;Functional mobility training;Therapeutic activities;Therapeutic exercise;Balance training;Patient/family education;Wheelchair mobility training    PT Goals (Current goals can be found in the Care Plan section)  Acute Rehab PT Goals Patient Stated Goal: to improve strength and functional mobility PT Goal Formulation: Patient unable to participate in goal setting Time For Goal Achievement: 10/25/23 Potential to Achieve Goals: Fair    Frequency Min 1X/week     Co-evaluation PT/OT/SLP Co-Evaluation/Treatment: Yes Reason for Co-Treatment: Necessary to address cognition/behavior during functional activity;For patient/therapist safety;To address functional/ADL transfers PT goals addressed during session: Mobility/safety with mobility;Balance OT goals addressed during session: ADL's and self-care       AM-PAC PT 6 Clicks Mobility  Outcome Measure Help needed turning from your back to your side while in a flat bed without using bedrails?: A Lot Help needed moving from lying on your back to sitting on the side  of a flat bed without using bedrails?: Total Help needed moving to and from a bed to a chair (including a wheelchair)?: Total Help needed standing up from a chair using  your arms (e.g., wheelchair or bedside chair)?: Total Help needed to walk in hospital room?: Total Help needed climbing 3-5 steps with a railing? : Total 6 Click Score: 7    End of Session   Activity Tolerance: Patient limited by fatigue Patient left: in bed;with call bell/phone within reach;with bed alarm set;Other (comment);with SCD's reapplied (B heels floating via pillow support) Nurse Communication: Mobility status;Precautions PT Visit Diagnosis: Other abnormalities of gait and mobility (R26.89);Muscle weakness (generalized) (M62.81);History of falling (Z91.81);Pain    Time: 8577-8555 PT Time Calculation (min) (ACUTE ONLY): 22 min   Charges:   PT Evaluation $PT Eval Low Complexity: 1 Low   PT General Charges $$ ACUTE PT VISIT: 1 Visit        Damien Caulk, PT 10/11/23, 3:23 PM

## 2023-10-11 NOTE — Evaluation (Signed)
 Occupational Therapy Evaluation Patient Details Name: Jenna Franklin MRN: [id number] DOB: 1940/11/13 Today's Date: 10/11/2023   History of Present Illness   Pt is an 83 y.o female presenting to hospital 10/08/23 with c/o weakness and AMS; also pt was diagnosed with COVID.  Hospitalization 7/10-8/21/25 d/t R femoral fx (s/p IMN R 08/26/23) and pt found to have PE (pt not a good candidate for anticoagulant use d/t high risk of re-bleeding; pt s/p IVC filter placement).  Pt now admitted with COVID-19 virus infection, acute metabolic encephalopathy, cirrhosis of liver with ascites.  PMH includes possible stage IV colon CA with peritoneal carcinomatosis, liver cirrhosis with ascites, esophageal varices, GI bleeding, anemia, htn, prediabetes, dCHF, spinal stenosis, kidney stone, PE (s/p IVC filter), vertigo.     Clinical Impressions Patient presenting with decreased Ind in self care,balance, functional mobility/transfers, endurance, and safety awareness. Patient is very lethargic during this session and keeping eyes mostly closed. She has difficulty following any commands or answering questions. Pt seen for skilled co-evaluation with PT for pt and therapist safety. Pt needing +2 assistance for all bed mobility. She is very fearful of falling and grasping for sheets and rails once EOB. She was able sit on EOB for several minutes with mod A of 2 progressing to SBA of 2 for static sitting balance. Pt is placed on TRIALof OT at this time to determine appropriateness. Patient will benefit from acute OT to increase overall independence in the areas of ADLs, functional mobility, and safety awareness in order to safely discharge..      If plan is discharge home, recommend the following:   Two people to help with walking and/or transfers;Two people to help with bathing/dressing/bathroom;Assistance with cooking/housework;Direct supervision/assist for medications management;Assist for transportation;Supervision  due to cognitive status;Direct supervision/assist for financial management;Help with stairs or ramp for entrance;Assistance with feeding     Functional Status Assessment   Patient has had a recent decline in their functional status and demonstrates the ability to make significant improvements in function in a reasonable and predictable amount of time.     Equipment Recommendations   Other (comment) (defer to next venue of care)      Precautions/Restrictions   Precautions Precautions: Fall Recall of Precautions/Restrictions: Impaired Restrictions Weight Bearing Restrictions Per Provider Order: Yes RLE Weight Bearing Per Provider Order: Weight bearing as tolerated     Mobility Bed Mobility Overal bed mobility: Needs Assistance Bed Mobility: Supine to Sit, Sit to Supine     Supine to sit: Max assist, +2 for physical assistance, HOB elevated Sit to supine: Max assist, +2 for physical assistance, HOB elevated   General bed mobility comments: vc's for technique; assist for trunk and B LE's; minimal initiation by pt noted    Transfers                   General transfer comment: Deferred d/t safety concerns      Balance Overall balance assessment: Needs assistance Sitting-balance support: Bilateral upper extremity supported, Feet unsupported Sitting balance-Leahy Scale: Poor Sitting balance - Comments: Initially mod assist x2 progressing to close SBA x2 Postural control: Posterior lean                                 ADL either performed or assessed with clinical judgement   ADL Overall ADL's : Needs assistance/impaired  General ADL Comments: total A needed. Pt unable to follow commands this session in regards to self care tasks.     Vision Patient Visual Report: No change from baseline              Pertinent Vitals/Pain Pain Assessment Pain Assessment: Faces Faces Pain Scale:  Hurts little more Pain Location: chest with coughing; generalized body pain Pain Descriptors / Indicators: Discomfort, Grimacing Pain Intervention(s): Limited activity within patient's tolerance, Monitored during session, Repositioned     Extremity/Trunk Assessment Upper Extremity Assessment Upper Extremity Assessment: Generalized weakness   Lower Extremity Assessment Lower Extremity Assessment: Generalized weakness   Cervical / Trunk Assessment Cervical / Trunk Assessment: Other exceptions Cervical / Trunk Exceptions: forward head/shoulders   Communication Communication Communication: No apparent difficulties   Cognition Arousal: Lethargic (eyes closed) Behavior During Therapy: Flat affect Cognition: Cognition impaired   Orientation impairments: Place, Time, Situation Awareness: Intellectual awareness impaired, Online awareness impaired   Attention impairment (select first level of impairment): Focused attention Executive functioning impairment (select all impairments): Initiation, Organization, Sequencing, Reasoning, Problem solving                   Following commands: Impaired Following commands impaired: Follows one step commands inconsistently, Follows one step commands with increased time     Cueing  General Comments   Cueing Techniques: Verbal cues;Tactile cues;Visual cues  R>L lower leg, ankle, and feet swelling noted (nurse notified).  Supine BP 110/60 with HR 104 bpm and SpO2 sats 96% room air; sitting (on EOB) BP 111/64 with HR 107 bpm and SpO2 sats 96% on room air.   Exercises     Shoulder Instructions      Home Living Family/patient expects to be discharged to:: Skilled nursing facility Living Arrangements: Alone Available Help at Discharge: Family;Available PRN/intermittently Type of Home: House Home Access: Stairs to enter [employer] of Steps: pt unsure of number or rails   Home Layout: One level     Bathroom Shower/Tub:  Producer, television/film/video: Handicapped height Bathroom Accessibility: Yes   Home Equipment: Shower seat - built Char[employer] fundraiser (2 wheels);Cane - single point   Additional Comments: Pt recently at [facility] for rehab (7/21-8/22/25).  Pt unable to verbalize PLOF or home set-up (home set-up and PLOF information per therapy from recent hospitalization).      Prior Functioning/Environment Prior Level of Function : Independent/Modified Independent             Mobility Comments: Uses RW ADLs Comments: Family assist PRN    OT Problem List: Decreased strength;Impaired balance (sitting and/or standing);Decreased cognition;Pain;Decreased safety awareness;Decreased activity tolerance   OT Treatment/Interventions: Self-care/ADL training;Therapeutic exercise;Patient/family education;Balance training;Energy conservation;Therapeutic activities      OT Goals(Current goals can be found in the care plan section)   Acute Rehab OT Goals Patient Stated Goal: to go to rehab OT Goal Formulation: With patient Time For Goal Achievement: 10/25/23 Potential to Achieve Goals: Fair ADL Goals Pt Will Perform Grooming: with min assist;sitting Pt Will Perform Lower Body Dressing: with min assist;sit to/from stand Pt Will Transfer to Toilet: with min assist;bedside commode Pt Will Perform Toileting - Clothing Manipulation and hygiene: with min assist;sit to/from stand   OT Frequency:  Min 1X/week    Co-evaluation PT/OT/SLP Co-Evaluation/Treatment: Yes Reason for Co-Treatment: Necessary to address cognition/behavior during functional activity;For patient/therapist safety;To address functional/ADL transfers PT goals addressed during session: Mobility/safety with mobility;Balance OT goals addressed during session: ADL's and self-care  AM-PAC OT 6 Clicks Daily Activity     Outcome Measure Help from another person eating meals?: A Lot Help from another person taking care of  personal grooming?: A Lot Help from another person toileting, which includes using toliet, bedpan, or urinal?: Total Help from another person bathing (including washing, rinsing, drying)?: Total Help from another person to put on and taking off regular upper body clothing?: A Lot Help from another person to put on and taking off regular lower body clothing?: Total 6 Click Score: 9   End of Session    Activity Tolerance: Patient limited by fatigue Patient left: in bed;with call bell/phone within reach;with bed alarm set  OT Visit Diagnosis: Unsteadiness on feet (R26.81);Muscle weakness (generalized) (M62.81)                Time: 8578-8555 OT Time Calculation (min): 23 min Charges:  OT General Charges $OT Visit: 1 Visit OT Evaluation $OT Eval Moderate Complexity: 1 [address] [place], OTR/L , CBIS ascom [phone]  10/11/23, 4:22 PM

## 2023-10-11 NOTE — Progress Notes (Signed)
 OT Cancellation Note  Patient Details Name: Jenna Franklin MRN: [id number] DOB: 06/18/1940   Cancelled Treatment:    Reason Eval/Treat Not Completed: Other (comment). Per chart review and conversation with MD via secure chat, plan if for comfort care and hospice. OT to complete therapy orders at this time.   Izetta Claude, MS, OTR/L , CBIS ascom [phone]  10/11/23, 9:33 AM

## 2023-10-11 NOTE — Plan of Care (Signed)

## 2023-10-11 NOTE — Progress Notes (Signed)
 [facility] Liasion Note  Patient remains inappropriate for the Hospice Home at this time.  Hospital team and daughter aware.  Patient will return to [facility] at discharge if shara is approved.    Please call with any Hospice related questions or concerns.  Thank you for the opportunity to participate in this patient's care.  [facility] hospice Liaison [phone]

## 2023-10-11 NOTE — Discharge Summary (Addendum)
 Physician Discharge Summary   Patient: Jenna Franklin MRN: [id number] DOB: 03/01/1940  Admit date:     10/08/2023  Discharge date: 10/12/2023  Discharge Physician: Burnard DELENA Cunning   PCP: Justus Leita DEL, MD   Recommendations at discharge:   Follow up with Primary Care in 1-2 weeks Recommend transition to hospice as patient's condition continues to decline In goals of care discussions, family decided on no further labs, blood transfusions or procedures unless they would provide patient comfort / quality of life.   Discharge Diagnoses: Principal Problem:   COVID-19 virus infection Active Problems:   Acute metabolic encephalopathy   Cirrhosis of liver with ascites (HCC)   Chronic diastolic CHF (congestive heart failure) (HCC)   HTN (hypertension)   Normocytic anemia   Pulmonary embolism (HCC)   Hypokalemia   Hypophosphatemia   Elevated lactic acid level   Colon cancer with peritoneal carcinomatosis   Hospice care patient  Resolved Problems:   * No resolved hospital problems. *  Hospital Course:  [name] is a 83 y.o. female with medical history significant of newly diagnosed stage IV colon with peritoneal carcinomatosis, liver cirrhosis with ascites, esophageal varices, GI bleeding, anemia, HTN, prediabetes, dCHF, spinal stenosis, kidney stone, recently diagnosed PE (s/p IVC, not candidate for anticoagulants due to high risk of bleeding), who presents with AMS and COVID infection.  See H&P for full HPI on admission & ED course.   Pt has been in rehab since 09/06/23 after admission for right femur fracture s/p surgical repair.  Pt was found with a PE during that admission, but not candidate for anticoagulation due to GI bleeding, IVC filter was placed.   Currently admitted with Covid-19 infection being managed with supportive care.    Palliative care team was consulted for goals of care discussions.  Extensive discussions with palliative and family, and I've  spoken with patient's daughter about same.  Decision was made to forego any further lab draws, blood transfusions for declining hemoglobin given known colon mass with ongoing intermittent bleeding for some time.  Paracentesis was also deferred, as it would be unlikely to provide comfort, with current distension being mild to moderate and does not seem to be impacting patient's breathing or causing her any pain.  Patient has been deemed not yet a candidate for inpatient hospice unit, due to low symptom burden at this time.  Family would like patient to return to [facility] for rehab and expect possible transition to hospice in the near future.   8/25 -- Patient is medically stable for discharge and transfer back to [facility].   8/26 addendum -- d/c yesterday was delayed while awaiting insurance reauthorization for SNF.  Pt remains medically stable for d/c today.    Assessment and Plan:  Colon cancer with peritoneal carcinomatosis: Patient was seen by Dr. Babara of oncology recently, had PET scan, consistent with stage IV colon cancer with peritoneal metastasis.  Patient is not a good candidate for aggressive treatment.   Palliative referral was made recently. --Palliative consult for GOC discussions and consideration of hospice   Goals of Care --- no further labs, no blood transfusions, paracentesis cancelled - will only do procedure if it will improve patient's comfort or reduce pain. --Palliative NP following, appreciate assistance addressing goals --Plan for d/c back to [facility] for rehab with potential to transition to hospice, but currently providing time for outcomes and continuing current medications --No labs or procedures, no blood transfusions -- based on discussions this  admission with patient's daughter   *COVID-19 virus infection: Daughter reported that the patient is tested positive for COVID 19 today.  Patient has mild SOB, no cough, fever or chills.  Chest x-ray  negative for pneumonia.  No respiratory distress. --Supportive care --As needed albuterol , Mucinex  --As needed nasal cannula oxygen --Airborne and contact precautions   Acute metabolic encephalopathy: Suspect from Covid infection. CT head negative.  Ammonia level normal 15,, less likely hepatic encephalopathy.   --Fall precaution --Neuro-checks --Delirium precautions   Cirrhosis of liver with ascites: moderate ascites noted on admission.  No abdominal pain, no tenderness on exam.  Afebrile, no leukocytosis.   Low suspicion for SBP at this time, monitor closely. Ammonia level normal 15. --Reduce lactulose  20 g twice daily >> once daily --Cancelled paracentesis per GOC --Combined MELD score 17 (96.9% estimated 90 day survival)   Chronic diastolic CHF (congestive heart failure) (HCC): 2D echo on 08/30/2023 showed EF 55 to 60%.  Anasarca noted on admission, due to liver cirrhosis.  BNP normal 58.7, less likely CHF decompensation. --Continue Lasix  and spironolactone    HTN (hypertension) --Nadolol  --IV hydralazine  as needed   Normocytic anemia: Hemoglobin 8.8 (8.9 on 09/05/2023).  No active bleeding. --No further labs    Pulmonary embolism  - diagnosed prior admission in July.  S/p IVC filter placement Not on anticoagulation due to history of GI bleeding, risks outweigh benefits.   Hypokalemia  Hypophosphatemia:  K and phos were replaced on admission. --No further labs    Elevated lactic acid level: Resolved. Lactic acid 2.2 on admission >> 1.5 with 1 L fluids given in the ED.  No SIRS criteria to meet sepsis criteria and clinically does appear septic.   Suspect due to decreased clearance secondary to liver cirrhosis, possibly mild dehydration related to Covid if PO intake has been decreased --No further fluids given ascites and cirrhosis, edema   Pressure injury of skin - POA  I agree with the wound description/s below --Continue wound care, frequent repositioning and off loading  pressure areas     Consultants: Palliative Care  Procedures performed: None   Disposition: SNF with hospice  Diet recommendation:  Regular diet -- per patient pleasure / comfort  DISCHARGE MEDICATION: Allergies as of 10/11/2023       Reactions   Baclofen  Anxiety   Codeine Nausea And Vomiting   Sulfa Antibiotics Nausea Only   Prednisone  Anxiety        Medication List     TAKE these medications    acetaminophen  500 MG tablet Commonly known as: TYLENOL  Take 500 mg by mouth every 6 (six) hours as needed (pain).   albuterol  (2.5 MG/3ML) 0.083% nebulizer solution Commonly known as: PROVENTIL  Take 3 mLs (2.5 mg total) by nebulization every 4 (four) hours as needed for wheezing or shortness of breath.   feeding supplement Liqd Take 237 mLs by mouth 2 (two) times daily between meals.   furosemide  20 MG tablet Commonly known as: LASIX  Take 1 tablet (20 mg total) by mouth daily. TAKE ONE (1) TABLET BY MOUTH ONCE DAILY What changed:  how much to take additional instructions   guaiFENesin -dextromethorphan  100-10 MG/5ML syrup Commonly known as: ROBITUSSIN DM Take 5 mLs by mouth every 4 (four) hours as needed for cough.   HYDROcodone -acetaminophen  5-325 MG tablet Commonly known as: NORCO/VICODIN Take 1 tablet by mouth every 6 (six) hours as needed for severe pain (pain score 7-10) or moderate pain (pain score 4-6).   lactulose  10 GM/15ML solution Commonly known  as: CHRONULAC  Take 30 mLs (20 g total) by mouth daily. Start taking on: October 12, 2023 What changed: when to take this   LORazepam  0.5 MG tablet Commonly known as: Ativan  Take 1 tablet (0.5 mg total) by mouth every 8 (eight) hours as needed for anxiety.   nadolol  20 MG tablet Commonly known as: CORGARD  TAKE (1) TABLET BY MOUTH EVERY DAY   ondansetron  4 MG disintegrating tablet Commonly known as: ZOFRAN -ODT Take 1 tablet (4 mg total) by mouth every 8 (eight) hours as needed for nausea or vomiting.    spironolactone  25 MG tablet Commonly known as: ALDACTONE  Take 1 tablet (25 mg total) by mouth daily.        Discharge Exam: Filed Weights   10/08/23 2158 10/10/23 0547 10/11/23 0519  Weight: 61.5 kg 66.1 kg 65.5 kg   General exam: awake, drowsy appearing, no acute distress, slightly more interactive today HEENT: moist mucus membranes, hearing grossly normal  Respiratory system: CTAB with coarse upper airway secretion sounds, no expiratory wheezes, normal respiratory effort at rest on room air Cardiovascular system: normal S1/S2, RRR Gastrointestinal system: mildly distended, non-tender Central nervous system: A&O x 2. no gross focal neurologic deficits, normal speech Extremities: stable b/l pedal edema, SCD's on BLE's Skin: dry, intact, normal temperature Psychiatry: normal mood, congruent affect, judgement and insight appear normal   Condition at discharge: stable  The results of significant diagnostics from this hospitalization (including imaging, microbiology, ancillary and laboratory) are listed below for reference.   Imaging Studies: CT Head Wo Contrast Result Date: 10/08/2023 CLINICAL DATA:  Altered mental status. EXAM: CT HEAD WITHOUT CONTRAST TECHNIQUE: Contiguous axial images were obtained from the base of the skull through the vertex without intravenous contrast. RADIATION DOSE REDUCTION: This exam was performed according to the departmental dose-optimization program which includes automated exposure control, adjustment of the mA and/or kV according to patient size and/or use of iterative reconstruction technique. COMPARISON:  August 26, 2023 FINDINGS: Brain: There is generalized cerebral atrophy with widening of the extra-axial spaces and ventricular dilatation. There are areas of decreased attenuation within the white matter tracts of the supratentorial brain, consistent with microvascular disease changes. Vascular: No hyperdense vessel or unexpected calcification. Skull:  Normal. Negative for fracture or focal lesion. Sinuses/Orbits: No acute finding. Other: None. IMPRESSION: 1. Generalized cerebral atrophy and microvascular disease changes of the supratentorial brain. 2. No acute intracranial abnormality. Electronically Signed   By: Suzen Dials M.D.   On: 10/08/2023 18:11   DG Chest 2 View Result Date: 10/08/2023 CLINICAL DATA:  covid. EXAM: CHEST - 2 VIEW COMPARISON:  08/26/2023. FINDINGS: Low lung volume. Elevated right hemidiaphragm noted. There are linear areas of atelectasis/scarring at the right lung base. Bilateral lung fields are otherwise clear. Bilateral costophrenic angles are clear. Normal cardio-mediastinal silhouette. No acute osseous abnormalities. The soft tissues are within normal limits. IVC filter noted overlying the upper abdomen. IMPRESSION: No active cardiopulmonary disease. Electronically Signed   By: Ree Molt M.D.   On: 10/08/2023 13:33   NM PET Image Restag (PS) Skull Base To Thigh Result Date: 09/22/2023 CLINICAL DATA:  Initial treatment strategy for transverse colon mass with omental implants on CT. EXAM: NUCLEAR MEDICINE PET SKULL BASE TO THIGH TECHNIQUE: 6.72 mCi F-18 FDG was injected intravenously. Full-ring PET imaging was performed from the skull base to thigh after the radiotracer. CT data was obtained and used for attenuation correction and anatomic localization. Fasting blood glucose: 178 mg/dl COMPARISON:  CT of the chest, abdomen and  pelvis 08/26/2023. Chest CTA 08/29/2023 FINDINGS: Mediastinal blood pool activity: SUV max 1.7 NECK: No hypermetabolic cervical lymph nodes are identified.Activity at the tip of the tongue is likely physiologic. No suspicious activity identified within the pharyngeal mucosal space. Incidental CT findings: Bilateral carotid atherosclerosis. CHEST: There are no hypermetabolic mediastinal, hilar or axillary lymph nodes. No hypermetabolic pulmonary activity or highly suspicious nodularity. Previously  demonstrated 8 mm right middle lobe nodule on image 54/6 is not optimally evaluated based on size, although does not demonstrate significant hypermetabolic activity. Incidental CT findings: Increased atelectasis at the right lung base. Aberrant right subclavian artery and mild atherosclerosis again noted. ABDOMEN/PELVIS: Previously demonstrated circumferential mass involving the proximal transverse colon is markedly hypermetabolic (SUV max 11.5) consistent with primary colon cancer. There are multiple hypermetabolic peritoneal implants consistent with metastatic disease. For example, a 1.9 x 1.4 cm midline nodule in the omentum on image 97/6 has an SUV max of 3.1, a 4.4 x 2.2 cm mass involving the right rectus sheath on image 114/6 has an SUV max 8.1 and a periumbilical omental mass measuring 3.5 x 2.8 cm on image 121/6 as an SUV max of 8.5. No other suspicious bowel activity. No hypermetabolic activity identified within the liver, spleen, pancreas or adrenal glands. There is no hypermetabolic nodal activity in the abdomen or pelvis. Incidental CT findings: Cirrhotic configuration of the liver with mild splenomegaly. Moderate amount of ascites. Bilateral renal cysts without hypermetabolic activity for which no specific follow-up imaging is recommended. There are diverticular changes in the distal colon. Status post hysterectomy. SKELETON: There is no hypermetabolic activity to suggest osseous metastatic disease. Incidental CT findings: Chronic compression deformities at T12 and L1. Previous proximal right femoral ORIF. IMPRESSION: 1. The previously demonstrated circumferential mass involving the proximal transverse colon is markedly hypermetabolic consistent with primary colon cancer. 2. Multiple hypermetabolic peritoneal implants and moderate ascites consistent with peritoneal carcinomatosis. 3. No evidence of metastatic disease in the neck, chest or liver. Stable indeterminate right middle lobe nodule without  hypermetabolic activity; attention on follow-up recommended. 4. Cirrhosis with mild splenomegaly and moderate ascites. 5. Chronic compression deformities at T12 and L1. 6.  Aortic Atherosclerosis (ICD10-I70.0). Electronically Signed   By: Elsie Perone M.D.   On: 09/22/2023 13:26    Microbiology: Results for orders placed or performed during the hospital encounter of 04/21/19  Urine Culture     Status: Abnormal   Collection Time: 04/21/19  2:55 PM   Specimen: Urine, Clean Catch  Result Value Ref Range Status   Specimen Description   Final    URINE, CLEAN CATCH Performed at [facility], [address] [place], [place] [address]    Special Requests   Final    NONE Performed at [facility], [address], [place], [place] [address]    Culture (A)  Final    <10,000 COLONIES/mL INSIGNIFICANT GROWTH Performed at [facility], [address], [place], [place] [address]    Report Status 04/23/2019 FINAL  Final    Labs: CBC: Recent Labs  Lab 10/08/23 1257 10/09/23 0427 10/10/23 0530  WBC 7.1 7.9 6.8  HGB 8.0* 7.3* 6.6*  HCT 25.2* 22.9* 21.5*  MCV 88.7 88.1 90.0  PLT 160 140* 127*   Basic Metabolic Panel: Recent Labs  Lab 10/08/23 1257 10/08/23 2121 10/09/23 0427 10/10/23 0530  NA 130*  --  132* 131*  K 3.0*  --  4.4 3.7  CL 94*  --  97* 100  CO2 24  --  23 24  GLUCOSE 178*  --  170* 138*  BUN 13  --  10 12  CREATININE 0.69  --  0.64 0.73  CALCIUM 7.8*  --  7.9* 7.8*  MG  --  1.8  --   --   PHOS  --  2.3*  --  2.0*   Liver Function Tests: Recent Labs  Lab 10/09/23 0427 10/10/23 0530  AST 26 24  ALT 14 13  ALKPHOS 85 81  BILITOT 1.6* 1.3*  PROT 5.9* 5.8*  ALBUMIN  2.4* 2.6*   CBG: No results for input(s): GLUCAP in the last 168 hours.  Discharge time spent: greater than 30 minutes.  Signed: Burnard DELENA Cunning, DO [facility] 10/11/2023

## 2023-10-12 DIAGNOSIS — U071 COVID-19: Secondary | ICD-10-CM | POA: Diagnosis not present

## 2023-10-12 DIAGNOSIS — Z7189 Other specified counseling: Secondary | ICD-10-CM | POA: Diagnosis not present

## 2023-10-12 NOTE — TOC Progression Note (Addendum)
 Transition of Care [facility]) - Progression Note    Patient Details  Name: Jenna Franklin MRN: [id number] Date of Birth: September 04, 1940  Transition of Care [facility]) CM/SW Contact  Lauraine JAYSON Carpen, LCSW Phone Number: 10/12/2023, 8:28 AM  Clinical Narrative:   MELITA and auth still pending.  9:46 am: PASARR obtained: [id number] A. Auth still pending. [facility] admissions liaison confirmed she can still return today. If insurance requests a peer-to-peer review, the SNF will assist with the peer-to-peer. If denied, she will be private pay.  Expected Discharge Plan: Skilled Nursing Facility Barriers to Discharge: Continued Medical Work up               Expected Discharge Plan and Services     Post Acute Care Choice: Skilled Nursing Facility Living arrangements for the past 2 months: Single Family Home, Skilled Nursing Facility Expected Discharge Date: 10/11/23                                     Social Drivers of Health (SDOH) Interventions SDOH Screenings   Food Insecurity: No Food Insecurity (10/09/2023)  Housing: Low Risk  (10/09/2023)  Transportation Needs: No Transportation Needs (10/09/2023)  Utilities: Not At Risk (10/09/2023)  Alcohol Screen: Low Risk  (08/12/2022)  Depression (PHQ2-9): Low Risk  (08/12/2022)  Financial Resource Strain: Low Risk  (08/12/2022)  Physical Activity: Inactive (08/12/2022)  Social Connections: Unknown (10/09/2023)  Recent Concern: Social Connections - Socially Isolated (08/26/2023)  Stress: No Stress Concern Present (08/12/2022)  Tobacco Use: Low Risk  (10/08/2023)    Readmission Risk Interventions     No data to display

## 2023-10-12 NOTE — Progress Notes (Addendum)
 Daily Progress Note   Patient Name: Jenna Franklin       Date: 10/12/2023 DOB: 01/14/1941  Age: 83 y.o. MRN#: [id number] Attending Physician: Fausto Burnard LABOR, DO Primary Care Physician: Justus Leita DEL, MD Admit Date: 10/08/2023  Reason for Consultation/Follow-up: Establishing goals of care  Subjective: Previous PMT provider requested follow-up to assess for needs and potential symptom management.  Notes and labs reviewed.  In to see patient.  She is currently sitting in bed at this time eating breakfast.  She denies complaint, and no obvious distress noted.  No family present at bedside.  A note has been placed since my last chart review this morning, and patient is discharging to [facility] today.  PMT will sign off at this time.  Please reconsult if needs arise.  Length of Stay: 4  Current Medications: Scheduled Meds:   feeding supplement  237 mL Oral BID BM   furosemide   20 mg Oral Daily   lactulose   20 g Oral Daily   nadolol   20 mg Oral Daily   spironolactone   12.5 mg Oral Daily    Continuous Infusions:   PRN Meds: acetaminophen , albuterol , guaiFENesin -dextromethorphan , hydrALAZINE , HYDROcodone -acetaminophen , ondansetron  (ZOFRAN ) IV  Physical Exam Pulmonary:     Effort: Pulmonary effort is normal.             Vital Signs: BP (!) 102/59 (BP Location: Right Arm)   Pulse (!) 103   Temp 98.1 F (36.7 C)   Resp 19   Wt 65.5 kg   SpO2 90%   BMI 25.58 kg/m  SpO2: SpO2: 90 % O2 Device: O2 Device: Room Air O2 Flow Rate:    Intake/output summary:  Intake/Output Summary (Last 24 hours) at 10/12/2023 1219 Last data filed at 10/11/2023 1615 Gross per 24 hour  Intake --  Output 200 ml  Net -200 ml   LBM: Last BM Date : 10/10/23 Baseline Weight: Weight: 61.5  kg Most recent weight: Weight: 65.5 kg        Patient Active Problem List   Diagnosis Date Noted   Hospice care patient 10/11/2023   COVID-19 virus infection 10/08/2023   Pulmonary embolism (HCC) 10/08/2023   HTN (hypertension) 10/08/2023   Acute metabolic encephalopathy 10/08/2023   Normocytic anemia 10/08/2023   Colon cancer with peritoneal carcinomatosis 10/08/2023  Hypokalemia 10/08/2023   Elevated lactic acid level 10/08/2023   Hypophosphatemia 10/08/2023   Bilateral lower extremity edema 10/04/2023   Acute GI bleeding 09/03/2023   Acute pulmonary embolism (HCC) 09/02/2023   Closed fracture of right hip (HCC) 08/30/2023   Hepatic encephalopathy (HCC) 08/30/2023   Protein-calorie malnutrition, severe 08/27/2023   Right femoral fracture (HCC) 08/26/2023   Colonic mass 08/26/2023   Weight loss 08/26/2023   TMJ arthralgia 05/26/2019   Lumbar spinal stenosis 07/27/2018   Thoracic compression fracture, sequela 04/01/2018   Esophageal varices in cirrhosis (HCC)    Cirrhosis of liver with ascites (HCC) 05/29/2015   Post-menopausal atrophic vaginitis 05/29/2015   Anxiety 03/25/2015   Acid reflux 03/25/2015   Pre-diabetes 09/20/2014   Chronic diastolic CHF (congestive heart failure) (HCC) 10/05/2013   Bursitis, trochanteric 12/11/2011    Palliative Care Assessment & Plan     Recommendations/Plan: Patient discharging to [facility].  PMT will sign off.  Please reconsult if needs arise.  Code Status:    Code Status Orders  (From admission, onward)           Start     Ordered   10/08/23 2047  Do not attempt resuscitation (DNR)- Limited -Do Not Intubate (DNI)  (Code Status)  Continuous       Question Answer Comment  If pulseless and not breathing No CPR or chest compressions.   In Pre-Arrest Conditions (Patient Is Breathing and Has A Pulse) Do not intubate. Provide all appropriate non-invasive medical interventions. Avoid ICU transfer unless indicated or  required.   Consent: Discussion documented in EHR or advanced directives reviewed      10/08/23 2046           Code Status History     Date Active Date Inactive Code Status Order ID Comments User Context   10/08/2023 2021 10/08/2023 2046 Full Code [id number]  Hilma Rankins, MD ED   08/26/2023 1544 09/06/2023 2236 Full Code [id number]  CoxGreig SAILOR, DO Inpatient      Advance Directive Documentation    Flowsheet Row Most Recent Value  Type of Advance Directive Healthcare Power of Attorney, Living will  Pre-existing out of facility DNR order (yellow form or pink MOST form) --  MOST Form in Place? --    Thank you for allowing the Palliative Medicine Team to assist in the care of this patient.   Camelia Lewis, NP  Please contact Palliative Medicine Team phone at [phone] for questions and concerns.

## 2023-10-12 NOTE — TOC Transition Note (Signed)
 Transition of Care [facility]) - Discharge Note   Patient Details  Name: Jenna Franklin MRN: [id number] Date of Birth: 1940-05-25  Transition of Care [facility]) CM/SW Contact:  Corean ONEIDA Haddock, RN Phone Number: 10/12/2023, 11:54 AM   Clinical Narrative:      Patient will DC to: [facility] Anticipated DC date:10/12/23  Family notified: VM left for daughter [name] requesting return call  Transport by: [facility]  Per MD patient ready for DC to . RN, , patient's family, and facility notified of DC. Discharge Summary sent to facility. RN given number for report. DC packet on chart. Ambulance transport requested for patient.   TOC signing off.    Barriers to Discharge: Continued Medical Work up   Patient Goals and CMS Choice     Choice offered to / list presented to : Adult Children      Discharge Placement                       Discharge Plan and Services Additional resources added to the After Visit Summary for       Post Acute Care Choice: Skilled Nursing Facility                               Social Drivers of Health (SDOH) Interventions SDOH Screenings   Food Insecurity: No Food Insecurity (10/09/2023)  Housing: Low Risk  (10/09/2023)  Transportation Needs: No Transportation Needs (10/09/2023)  Utilities: Not At Risk (10/09/2023)  Alcohol Screen: Low Risk  (08/12/2022)  Depression (PHQ2-9): Low Risk  (08/12/2022)  Financial Resource Strain: Low Risk  (08/12/2022)  Physical Activity: Inactive (08/12/2022)  Social Connections: Unknown (10/09/2023)  Recent Concern: Social Connections - Socially Isolated (08/26/2023)  Stress: No Stress Concern Present (08/12/2022)  Tobacco Use: Low Risk  (10/08/2023)     Readmission Risk Interventions     No data to display

## 2023-10-13 ENCOUNTER — Inpatient Hospital Stay (HOSPITAL_BASED_OUTPATIENT_CLINIC_OR_DEPARTMENT_OTHER): Admitting: Hospice and Palliative Medicine

## 2023-10-13 DIAGNOSIS — E876 Hypokalemia: Secondary | ICD-10-CM | POA: Diagnosis not present

## 2023-10-13 DIAGNOSIS — D649 Anemia, unspecified: Secondary | ICD-10-CM | POA: Diagnosis not present

## 2023-10-13 DIAGNOSIS — I509 Heart failure, unspecified: Secondary | ICD-10-CM | POA: Diagnosis not present

## 2023-10-13 DIAGNOSIS — C189 Malignant neoplasm of colon, unspecified: Secondary | ICD-10-CM | POA: Diagnosis not present

## 2023-10-13 DIAGNOSIS — R600 Localized edema: Secondary | ICD-10-CM | POA: Diagnosis not present

## 2023-10-13 DIAGNOSIS — G9341 Metabolic encephalopathy: Secondary | ICD-10-CM | POA: Diagnosis not present

## 2023-10-13 DIAGNOSIS — K7460 Unspecified cirrhosis of liver: Secondary | ICD-10-CM | POA: Diagnosis not present

## 2023-10-13 DIAGNOSIS — Z515 Encounter for palliative care: Secondary | ICD-10-CM

## 2023-10-13 DIAGNOSIS — R188 Other ascites: Secondary | ICD-10-CM | POA: Diagnosis not present

## 2023-10-13 DIAGNOSIS — K6389 Other specified diseases of intestine: Secondary | ICD-10-CM

## 2023-10-13 DIAGNOSIS — R7989 Other specified abnormal findings of blood chemistry: Secondary | ICD-10-CM | POA: Diagnosis not present

## 2023-10-13 DIAGNOSIS — U071 COVID-19: Secondary | ICD-10-CM | POA: Diagnosis not present

## 2023-10-13 DIAGNOSIS — I110 Hypertensive heart disease with heart failure: Secondary | ICD-10-CM | POA: Diagnosis not present

## 2023-10-13 NOTE — Progress Notes (Signed)
 I spoke with patient's daughter.  Patient discharged from the hospital yesterday back to [facility].  Daughter confirms plan is to focus on patient's comfort until end-of-life.  Patient's son is meeting with SNF staff to coordinate hospice involvement.

## 2023-10-17 DIAGNOSIS — U071 COVID-19: Secondary | ICD-10-CM | POA: Diagnosis not present

## 2023-10-18 DEATH — deceased

## 2023-10-25 ENCOUNTER — Telehealth: Payer: Self-pay

## 2023-10-25 NOTE — Telephone Encounter (Signed)
 Copied from CRM [id number]. Topic: General - Other >> Oct 25, 2023 11:40 AM [place] MATSU wrote: Reason for CRM: Emma Case Manager from [facility] called to advise they have not been able to reach patient.  Per Maurilio she would just like to advise her concerns as patient was recently hospitalized.   CB: [phone]  May you please assist.
